# Patient Record
Sex: Male | Born: 1950
Health system: Southern US, Community
[De-identification: ages and names within clinical notes are randomized; demographics above are authoritative.]

## PROBLEM LIST (undated history)

## (undated) DIAGNOSIS — M545 Low back pain, unspecified: Secondary | ICD-10-CM

## (undated) DIAGNOSIS — G43909 Migraine, unspecified, not intractable, without status migrainosus: Secondary | ICD-10-CM

## (undated) DIAGNOSIS — D649 Anemia, unspecified: Secondary | ICD-10-CM

## (undated) DIAGNOSIS — J449 Chronic obstructive pulmonary disease, unspecified: Secondary | ICD-10-CM

## (undated) DIAGNOSIS — I701 Atherosclerosis of renal artery: Secondary | ICD-10-CM

## (undated) DIAGNOSIS — I2510 Atherosclerotic heart disease of native coronary artery without angina pectoris: Secondary | ICD-10-CM

## (undated) DIAGNOSIS — I82409 Acute embolism and thrombosis of unspecified deep veins of unspecified lower extremity: Secondary | ICD-10-CM

## (undated) DIAGNOSIS — Z8639 Personal history of other endocrine, nutritional and metabolic disease: Secondary | ICD-10-CM

## (undated) DIAGNOSIS — I255 Ischemic cardiomyopathy: Secondary | ICD-10-CM

## (undated) DIAGNOSIS — I714 Abdominal aortic aneurysm, without rupture, unspecified: Secondary | ICD-10-CM

## (undated) DIAGNOSIS — R918 Other nonspecific abnormal finding of lung field: Secondary | ICD-10-CM

## (undated) DIAGNOSIS — I6529 Occlusion and stenosis of unspecified carotid artery: Secondary | ICD-10-CM

## (undated) DIAGNOSIS — K219 Gastro-esophageal reflux disease without esophagitis: Secondary | ICD-10-CM

## (undated) DIAGNOSIS — I219 Acute myocardial infarction, unspecified: Secondary | ICD-10-CM

## (undated) DIAGNOSIS — I739 Peripheral vascular disease, unspecified: Secondary | ICD-10-CM

## (undated) DIAGNOSIS — I10 Essential (primary) hypertension: Secondary | ICD-10-CM

## (undated) DIAGNOSIS — I5042 Chronic combined systolic (congestive) and diastolic (congestive) heart failure: Secondary | ICD-10-CM

## (undated) DIAGNOSIS — E785 Hyperlipidemia, unspecified: Secondary | ICD-10-CM

## (undated) DIAGNOSIS — H5340 Unspecified visual field defects: Secondary | ICD-10-CM

## (undated) DIAGNOSIS — I480 Paroxysmal atrial fibrillation: Secondary | ICD-10-CM

## (undated) DIAGNOSIS — S0592XA Unspecified injury of left eye and orbit, initial encounter: Secondary | ICD-10-CM

## (undated) DIAGNOSIS — K7010 Alcoholic hepatitis without ascites: Secondary | ICD-10-CM

## (undated) DIAGNOSIS — J189 Pneumonia, unspecified organism: Secondary | ICD-10-CM

## (undated) DIAGNOSIS — I771 Stricture of artery: Secondary | ICD-10-CM

## (undated) HISTORY — DX: Migraine, unspecified, not intractable, without status migrainosus: G43.909

## (undated) HISTORY — DX: Ischemic cardiomyopathy: I25.5

## (undated) HISTORY — DX: Chronic obstructive pulmonary disease, unspecified: J44.9

## (undated) HISTORY — DX: Atherosclerosis of renal artery: I70.1

## (undated) HISTORY — PX: CARDIAC CATHETERIZATION: SHX172

## (undated) HISTORY — DX: Low back pain: M54.5

## (undated) HISTORY — DX: Hyperlipidemia, unspecified: E78.5

## (undated) HISTORY — DX: Other nonspecific abnormal finding of lung field: R91.8

## (undated) HISTORY — DX: Abdominal aortic aneurysm, without rupture, unspecified: I71.40

## (undated) HISTORY — DX: Abdominal aortic aneurysm, without rupture: I71.4

## (undated) HISTORY — DX: Occlusion and stenosis of unspecified carotid artery: I65.29

## (undated) HISTORY — DX: Peripheral vascular disease, unspecified: I73.9

## (undated) HISTORY — PX: TONSILLECTOMY: SUR1361

## (undated) HISTORY — DX: Unspecified injury of left eye and orbit, initial encounter: S05.92XA

## (undated) HISTORY — DX: Alcoholic hepatitis without ascites: K70.10

## (undated) HISTORY — DX: Stricture of artery: I77.1

## (undated) HISTORY — DX: Hypomagnesemia: E83.42

## (undated) HISTORY — DX: Unspecified visual field defects: H53.40

## (undated) HISTORY — DX: Essential (primary) hypertension: I10

## (undated) HISTORY — DX: Low back pain, unspecified: M54.50

## (undated) HISTORY — DX: Anemia, unspecified: D64.9

## (undated) HISTORY — DX: Acute myocardial infarction, unspecified: I21.9

## (undated) HISTORY — DX: Pneumonia, unspecified organism: J18.9

## (undated) HISTORY — DX: Atherosclerotic heart disease of native coronary artery without angina pectoris: I25.10

## (undated) HISTORY — DX: Personal history of other endocrine, nutritional and metabolic disease: Z86.39

---
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
## 1998-12-17 ENCOUNTER — Observation Stay: Admission: EM | Admit: 1998-12-17 | Discharge: 1998-12-18 | Payer: Self-pay | Admitting: Emergency Medicine

## 1998-12-17 ENCOUNTER — Encounter: Payer: Self-pay | Admitting: Emergency Medicine

## 2004-10-18 ENCOUNTER — Inpatient Hospital Stay (HOSPITAL_COMMUNITY): Admission: EM | Admit: 2004-10-18 | Discharge: 2004-10-25 | Payer: Self-pay | Admitting: Emergency Medicine

## 2006-11-30 ENCOUNTER — Encounter: Payer: Self-pay | Admitting: Critical Care Medicine

## 2006-12-06 ENCOUNTER — Ambulatory Visit: Payer: Self-pay | Admitting: Critical Care Medicine

## 2007-03-21 ENCOUNTER — Ambulatory Visit: Payer: Self-pay | Admitting: Critical Care Medicine

## 2007-05-06 ENCOUNTER — Emergency Department (HOSPITAL_COMMUNITY): Admission: EM | Admit: 2007-05-06 | Discharge: 2007-05-06 | Payer: Self-pay | Admitting: Emergency Medicine

## 2007-05-07 ENCOUNTER — Emergency Department (HOSPITAL_COMMUNITY): Admission: EM | Admit: 2007-05-07 | Discharge: 2007-05-07 | Payer: Self-pay | Admitting: Emergency Medicine

## 2007-05-09 ENCOUNTER — Ambulatory Visit (HOSPITAL_COMMUNITY): Admission: RE | Admit: 2007-05-09 | Discharge: 2007-05-09 | Payer: Self-pay | Admitting: Internal Medicine

## 2007-05-28 ENCOUNTER — Ambulatory Visit: Payer: Self-pay | Admitting: Internal Medicine

## 2007-06-04 ENCOUNTER — Ambulatory Visit: Payer: Self-pay

## 2007-06-04 ENCOUNTER — Encounter: Payer: Self-pay | Admitting: Internal Medicine

## 2007-06-11 ENCOUNTER — Ambulatory Visit: Payer: Self-pay | Admitting: Internal Medicine

## 2007-06-11 LAB — CONVERTED CEMR LAB
AST: 15 units/L (ref 0–37)
Bilirubin, Direct: 0.1 mg/dL (ref 0.0–0.3)
Chloride: 109 meq/L (ref 96–112)
Cholesterol: 169 mg/dL (ref 0–200)
Creatinine, Ser: 0.6 mg/dL (ref 0.4–1.5)
Glucose, Bld: 103 mg/dL — ABNORMAL HIGH (ref 70–99)
HDL: 25.4 mg/dL — ABNORMAL LOW (ref >39.0)
LDL Cholesterol: 120 mg/dL — ABNORMAL HIGH (ref 0–99)
Potassium: 4.1 meq/L (ref 3.5–5.1)
Sed Rate: 23 mm/hr — ABNORMAL HIGH (ref 0–20)
Sodium: 145 meq/L (ref 135–145)
TSH: 0.45 microintl units/mL (ref 0.35–5.50)
Total Bilirubin: 0.9 mg/dL (ref 0.3–1.2)
Triglycerides: 120 mg/dL (ref 0–149)

## 2007-06-25 ENCOUNTER — Ambulatory Visit: Payer: Self-pay | Admitting: Internal Medicine

## 2007-07-26 ENCOUNTER — Ambulatory Visit: Payer: Self-pay | Admitting: Critical Care Medicine

## 2007-07-26 DIAGNOSIS — J449 Chronic obstructive pulmonary disease, unspecified: Secondary | ICD-10-CM

## 2007-07-26 DIAGNOSIS — E785 Hyperlipidemia, unspecified: Secondary | ICD-10-CM

## 2007-07-26 DIAGNOSIS — I10 Essential (primary) hypertension: Secondary | ICD-10-CM | POA: Insufficient documentation

## 2007-08-02 ENCOUNTER — Ambulatory Visit: Payer: Self-pay | Admitting: Internal Medicine

## 2007-08-02 LAB — CONVERTED CEMR LAB
ALT: 22 units/L (ref 0–53)
AST: 17 units/L (ref 0–37)
Creatinine, Ser: 1.2 mg/dL (ref 0.4–1.5)
Potassium: 4.4 meq/L (ref 3.5–5.1)
VLDL: 16 mg/dL (ref 0–40)

## 2007-08-09 ENCOUNTER — Ambulatory Visit: Payer: Self-pay | Admitting: Internal Medicine

## 2007-08-09 DIAGNOSIS — G43909 Migraine, unspecified, not intractable, without status migrainosus: Secondary | ICD-10-CM | POA: Insufficient documentation

## 2007-09-06 ENCOUNTER — Ambulatory Visit: Payer: Self-pay | Admitting: Critical Care Medicine

## 2007-09-20 ENCOUNTER — Ambulatory Visit: Payer: Self-pay | Admitting: Internal Medicine

## 2007-09-20 DIAGNOSIS — H349 Unspecified retinal vascular occlusion: Secondary | ICD-10-CM | POA: Insufficient documentation

## 2007-09-20 DIAGNOSIS — I714 Abdominal aortic aneurysm, without rupture: Secondary | ICD-10-CM

## 2007-09-23 ENCOUNTER — Encounter (INDEPENDENT_AMBULATORY_CARE_PROVIDER_SITE_OTHER): Payer: Self-pay | Admitting: *Deleted

## 2008-01-28 ENCOUNTER — Ambulatory Visit: Payer: Self-pay | Admitting: Internal Medicine

## 2008-01-28 LAB — CONVERTED CEMR LAB
ALT: 21 units/L (ref 0–53)
AST: 19 units/L (ref 0–37)
Alkaline Phosphatase: 109 units/L (ref 39–117)
Bilirubin, Direct: 0.1 mg/dL (ref 0.0–0.3)
CO2: 29 meq/L (ref 19–32)
Chloride: 109 meq/L (ref 96–112)
Cholesterol: 142 mg/dL (ref 0–200)
GFR calc Af Amer: 98 mL/min
Glucose, Bld: 90 mg/dL (ref 70–99)
Potassium: 4.5 meq/L (ref 3.5–5.1)
Sodium: 142 meq/L (ref 135–145)
Total Protein: 6.9 g/dL (ref 6.0–8.3)

## 2008-01-30 ENCOUNTER — Ambulatory Visit: Payer: Self-pay | Admitting: Internal Medicine

## 2008-01-30 DIAGNOSIS — F17200 Nicotine dependence, unspecified, uncomplicated: Secondary | ICD-10-CM

## 2008-07-28 ENCOUNTER — Ambulatory Visit: Payer: Self-pay | Admitting: Critical Care Medicine

## 2008-08-07 ENCOUNTER — Telehealth: Payer: Self-pay | Admitting: Critical Care Medicine

## 2008-08-10 ENCOUNTER — Telehealth: Payer: Self-pay | Admitting: Adult Health

## 2008-08-13 ENCOUNTER — Ambulatory Visit: Payer: Self-pay | Admitting: Critical Care Medicine

## 2008-09-03 ENCOUNTER — Ambulatory Visit: Payer: Self-pay | Admitting: Critical Care Medicine

## 2008-10-08 ENCOUNTER — Ambulatory Visit: Payer: Self-pay | Admitting: Critical Care Medicine

## 2008-12-30 ENCOUNTER — Encounter (INDEPENDENT_AMBULATORY_CARE_PROVIDER_SITE_OTHER): Payer: Self-pay | Admitting: *Deleted

## 2009-02-04 ENCOUNTER — Ambulatory Visit: Payer: Self-pay | Admitting: Internal Medicine

## 2009-02-04 DIAGNOSIS — M545 Low back pain, unspecified: Secondary | ICD-10-CM | POA: Insufficient documentation

## 2009-02-05 ENCOUNTER — Ambulatory Visit: Payer: Self-pay | Admitting: Diagnostic Radiology

## 2009-02-05 ENCOUNTER — Ambulatory Visit (HOSPITAL_BASED_OUTPATIENT_CLINIC_OR_DEPARTMENT_OTHER): Admission: RE | Admit: 2009-02-05 | Discharge: 2009-02-05 | Payer: Self-pay | Admitting: Internal Medicine

## 2009-02-07 ENCOUNTER — Telehealth: Payer: Self-pay | Admitting: Internal Medicine

## 2009-05-04 ENCOUNTER — Telehealth: Payer: Self-pay | Admitting: Internal Medicine

## 2009-07-29 ENCOUNTER — Ambulatory Visit: Payer: Self-pay | Admitting: Critical Care Medicine

## 2009-08-12 ENCOUNTER — Ambulatory Visit (HOSPITAL_BASED_OUTPATIENT_CLINIC_OR_DEPARTMENT_OTHER): Admission: RE | Admit: 2009-08-12 | Discharge: 2009-08-12 | Payer: Self-pay | Admitting: Critical Care Medicine

## 2009-08-12 ENCOUNTER — Ambulatory Visit: Payer: Self-pay | Admitting: Critical Care Medicine

## 2009-08-12 ENCOUNTER — Ambulatory Visit: Payer: Self-pay | Admitting: Diagnostic Radiology

## 2009-09-02 ENCOUNTER — Ambulatory Visit: Payer: Self-pay | Admitting: Critical Care Medicine

## 2009-09-02 ENCOUNTER — Ambulatory Visit: Payer: Self-pay | Admitting: Internal Medicine

## 2009-09-02 DIAGNOSIS — M7660 Achilles tendinitis, unspecified leg: Secondary | ICD-10-CM | POA: Insufficient documentation

## 2009-09-02 LAB — CONVERTED CEMR LAB
AST: 11 units/L (ref 0–37)
BUN: 24 mg/dL — ABNORMAL HIGH (ref 6–23)
Bilirubin, Direct: 0.2 mg/dL (ref 0.0–0.3)
CO2: 23 meq/L (ref 19–32)
Calcium: 10 mg/dL (ref 8.4–10.5)
Glucose, Bld: 80 mg/dL (ref 70–99)
Sodium: 141 meq/L (ref 135–145)
TSH: 1.255 microintl units/mL (ref 0.350–4.500)
Total Bilirubin: 0.7 mg/dL (ref 0.3–1.2)
Total CHOL/HDL Ratio: 4.4
VLDL: 34 mg/dL (ref 0–40)

## 2009-09-03 ENCOUNTER — Encounter: Payer: Self-pay | Admitting: Internal Medicine

## 2009-09-23 ENCOUNTER — Ambulatory Visit: Payer: Self-pay | Admitting: Critical Care Medicine

## 2009-10-07 ENCOUNTER — Ambulatory Visit: Payer: Self-pay | Admitting: Critical Care Medicine

## 2009-10-12 ENCOUNTER — Telehealth: Payer: Self-pay | Admitting: Internal Medicine

## 2009-10-28 ENCOUNTER — Ambulatory Visit: Payer: Self-pay | Admitting: Internal Medicine

## 2009-10-29 ENCOUNTER — Encounter: Payer: Self-pay | Admitting: Internal Medicine

## 2009-10-30 ENCOUNTER — Ambulatory Visit (HOSPITAL_COMMUNITY): Admission: RE | Admit: 2009-10-30 | Discharge: 2009-10-30 | Payer: Self-pay | Admitting: Orthopedic Surgery

## 2009-10-30 ENCOUNTER — Encounter: Payer: Self-pay | Admitting: Orthopedic Surgery

## 2009-11-02 ENCOUNTER — Telehealth: Payer: Self-pay | Admitting: Critical Care Medicine

## 2010-02-15 ENCOUNTER — Telehealth (INDEPENDENT_AMBULATORY_CARE_PROVIDER_SITE_OTHER): Payer: Self-pay | Admitting: *Deleted

## 2010-02-17 ENCOUNTER — Ambulatory Visit: Payer: Self-pay | Admitting: Critical Care Medicine

## 2010-02-17 ENCOUNTER — Ambulatory Visit: Payer: Self-pay | Admitting: Internal Medicine

## 2010-02-17 DIAGNOSIS — R079 Chest pain, unspecified: Secondary | ICD-10-CM

## 2010-02-18 ENCOUNTER — Telehealth: Payer: Self-pay | Admitting: Internal Medicine

## 2010-02-23 ENCOUNTER — Encounter: Payer: Self-pay | Admitting: Cardiology

## 2010-02-23 ENCOUNTER — Ambulatory Visit: Payer: Self-pay | Admitting: Cardiology

## 2010-02-23 ENCOUNTER — Telehealth: Payer: Self-pay | Admitting: Cardiology

## 2010-02-23 DIAGNOSIS — I679 Cerebrovascular disease, unspecified: Secondary | ICD-10-CM

## 2010-02-23 DIAGNOSIS — I739 Peripheral vascular disease, unspecified: Secondary | ICD-10-CM

## 2010-02-23 DIAGNOSIS — R011 Cardiac murmur, unspecified: Secondary | ICD-10-CM | POA: Insufficient documentation

## 2010-03-15 ENCOUNTER — Ambulatory Visit: Payer: Self-pay | Admitting: Critical Care Medicine

## 2010-03-18 ENCOUNTER — Telehealth (INDEPENDENT_AMBULATORY_CARE_PROVIDER_SITE_OTHER): Payer: Self-pay | Admitting: *Deleted

## 2010-03-24 ENCOUNTER — Ambulatory Visit: Payer: Self-pay

## 2010-03-24 ENCOUNTER — Ambulatory Visit (HOSPITAL_COMMUNITY): Admission: RE | Admit: 2010-03-24 | Discharge: 2010-03-24 | Payer: Self-pay | Admitting: Cardiology

## 2010-03-24 ENCOUNTER — Ambulatory Visit: Payer: Self-pay | Admitting: Cardiology

## 2010-03-24 ENCOUNTER — Encounter: Payer: Self-pay | Admitting: Cardiology

## 2010-05-13 ENCOUNTER — Telehealth: Payer: Self-pay | Admitting: Internal Medicine

## 2010-05-16 ENCOUNTER — Ambulatory Visit: Payer: Self-pay | Admitting: Cardiovascular Disease

## 2010-05-16 DIAGNOSIS — I701 Atherosclerosis of renal artery: Secondary | ICD-10-CM

## 2010-06-13 ENCOUNTER — Telehealth: Payer: Self-pay | Admitting: Critical Care Medicine

## 2010-06-16 ENCOUNTER — Ambulatory Visit: Payer: Self-pay | Admitting: Internal Medicine

## 2010-06-28 ENCOUNTER — Ambulatory Visit: Payer: Self-pay | Admitting: Adult Health

## 2010-07-16 ENCOUNTER — Ambulatory Visit: Payer: Self-pay | Admitting: Family Medicine

## 2010-07-16 DIAGNOSIS — J441 Chronic obstructive pulmonary disease with (acute) exacerbation: Secondary | ICD-10-CM

## 2010-08-11 ENCOUNTER — Ambulatory Visit: Payer: Self-pay | Admitting: Critical Care Medicine

## 2010-08-15 ENCOUNTER — Ambulatory Visit: Payer: Self-pay | Admitting: Internal Medicine

## 2010-08-23 ENCOUNTER — Telehealth (INDEPENDENT_AMBULATORY_CARE_PROVIDER_SITE_OTHER): Payer: Self-pay | Admitting: *Deleted

## 2010-08-23 ENCOUNTER — Telehealth: Payer: Self-pay | Admitting: Critical Care Medicine

## 2010-09-27 ENCOUNTER — Ambulatory Visit: Payer: Self-pay | Admitting: Internal Medicine

## 2010-09-28 ENCOUNTER — Ambulatory Visit
Admission: RE | Admit: 2010-09-28 | Discharge: 2010-09-28 | Payer: Self-pay | Source: Home / Self Care | Attending: Internal Medicine | Admitting: Internal Medicine

## 2010-10-23 ENCOUNTER — Encounter: Payer: Self-pay | Admitting: Internal Medicine

## 2010-11-01 ENCOUNTER — Ambulatory Visit
Admission: RE | Admit: 2010-11-01 | Discharge: 2010-11-01 | Payer: Self-pay | Source: Home / Self Care | Attending: Critical Care Medicine | Admitting: Critical Care Medicine

## 2010-11-01 NOTE — Progress Notes (Signed)
Summary: Hydrocodone Refill  Phone Note Refill Request Message from:  Fax from Pharmacy on May 13, 2010 2:38 PM  Refills Requested: Medication #1:  HYDROCODONE-ACETAMINOPHEN 5-500 MG TABS one by mouth two times a day prn   Dosage confirmed as above?Dosage Confirmed   Brand Name Necessary? No   Supply Requested: 1 month   Last Refilled: 03/31/2010  Method Requested: Telephone to Pharmacy Next Appointment Scheduled: No follow up appt  with Dr Artist Pais Initial call taken by: Glendell Docker CMA,  May 13, 2010 2:38 PM  Follow-up for Phone Call        refill x 1  for treatment of migraine headaches. Follow-up by: D. Thomos Lemons DO,  May 16, 2010 9:43 AM  Additional Follow-up for Phone Call Additional follow up Details #1::        Rx called to pharmacy to [facility]. Call placed to patient at [phone], no answer, voice message left informing patient rx phoned to pharmacy Additional Follow-up by: Glendell Docker CMA,  May 16, 2010 11:57 AM    Prescriptions: HYDROCODONE-ACETAMINOPHEN 5-500 MG TABS (HYDROCODONE-ACETAMINOPHEN) one by mouth two times a day prn  #90 x 0   Entered by:   Glendell Docker CMA   Authorized by:   D. Thomos Lemons DO   Signed by:   Glendell Docker CMA on 05/16/2010   Method used:   Telephoned to ...       [id number] (retail)       [address]       [place], [place]  [address]       Ph: [phone] or [phone]       Fax: [phone]   RxID:   [id number]

## 2010-11-01 NOTE — Progress Notes (Signed)
Summary: Benicar Refill  Phone Note Call from Patient Call back at Home Phone [phone]   Caller: Lequita Asal Summary of Call: Patient spouse called back and left voice message stating patient did not have any Benicar left and will need a rx for the dosage change sent to his pharmacy.  Initial call taken by: Glendell Docker CMA,  October 12, 2009 11:41 AM  Follow-up for Phone Call        Phone Call Completed, Rx sent electronically to pharmacy Follow-up by: Glendell Docker CMA,  October 12, 2009 11:41 AM    Prescriptions: BENICAR 20 MG TABS (OLMESARTAN MEDOXOMIL) once daily  #30 x 3   Entered by:   Glendell Docker CMA   Authorized by:   D. Thomos Lemons DO   Signed by:   Glendell Docker CMA on 10/12/2009   Method used:   Electronically to        [id number] (retail)       [address]       [place], [place]  [address]       Ph: [phone] or [phone]       Fax: [phone]   RxID:   [id number]

## 2010-11-01 NOTE — Assessment & Plan Note (Signed)
Summary: 1 month follow up/mhf--Rm 2   Vital Signs:  Patient profile:   60 year old male Height:      68 inches Weight:      182.50 pounds BMI:     27.85 O2 Sat:      94 % on Room air Temp:     97.9 degrees F oral Pulse rate:   77 / minute Pulse rhythm:   regular Resp:     18 per minute BP sitting:   164 / 76  (right arm) Cuff size:   large  Vitals Entered By: Mervin Kung CMA Duncan Dull) (June 16, 2010 2:11 PM)  O2 Flow:  Room air CC: Rm 2   Follow up. Is Patient Diabetic? No Pain Assessment Patient in pain? yes     Location: lower back and right leg Intensity: 5 Type: sharp Comments Pt states back pain is no worse but is not getting better. Needs refill on Simvastatin and Hydromet.  Pt agrees all med doses and directions correct. Nicki Guadalajara Fergerson CMA Duncan Dull)  June 16, 2010 2:17 PM    Primary Care Provider:  Dondra Spry DO  CC:  Rm 2   Follow up.Marland Kitchen  History of Present Illness: 60 y/o white male for f/u pt diagnosed with significant PAD still unable to quit smoking  c/o chronic intermittent back pain and occ headaches  htn - taking benicar regularly  Preventive Screening-Counseling & Management  Alcohol-Tobacco     Smoking Status: current     Smoking Cessation Counseling: yes  Allergies: 1)  ! Penicillin 2)  ! Cephalexin 3)  ! Avelox (Moxifloxacin Hcl)  Past History:  Past Medical History: Hypertension Hyperlipidemia AAA 5.0x4.8  BACK PAIN, LUMBAR  MIGRAINE HEADACHE COPD Golds Stage II     -Fev1 73%  FeV1FVC 49%  Rv 135%  DLCO 68% 03/2007 History of severe hypokalemia and hypomagnesemia secondary to alcohol abuse January of 2006.  Right Visual field cut with supranasal quadrantopia secondary to retinal artery occlusion History of alcoholic hepatitis in January of 2006. Status post trauma to the left eye 15 years ago. Left Achilles tendon rupture PAD with intermittent claudication, bilateral SFA occlusion Left subclavian stenosis by  ultrasound  Past Surgical History: Tonsillectomy  adenoidectomy   Family History: Father died in his mid 18s secondary to myocardial infarction Mother died young age secondary to throat cancer      Social History: Occupation: Engineer, structural Married with two grown daughters  current smoker - using e cigarettes Alcohol use-no; former abuse quit 2006  Physical Exam  General:  alert, well-developed, and well-nourished.   Lungs:  decreased BS bilateral and prolonged exhalation.  bilateral exp wheeze  Heart:  SEM 2 /6 RSB, normal rate, regular rhythm, and no gallop.     Impression & Recommendations:  Problem # 1:  HYPERTENSION (ICD-401.9) Assessment Deteriorated add amlodipine  The following medications were removed from the medication list:    Benicar 40 Mg Tabs (Olmesartan medoxomil) .Marland Kitchen... Take one tablet by mouth daily His updated medication list for this problem includes:    Azor 5-40 Mg Tabs (Amlodipine-olmesartan) ..... One by mouth once daily  Orders: T-Basic Metabolic Panel ([id number])  BP today: 164/76 Prior BP: 132/72 (05/16/2010)  Labs Reviewed: K+: 5.1 (09/02/2009) Creat: : 1.04 (09/02/2009)   Chol: 167 (09/02/2009)   HDL: 38 (09/02/2009)   LDL: 95 (09/02/2009)   TG: 171 (09/02/2009)  Problem # 2:  HYPERLIPIDEMIA (ICD-272.4) Assessment: Improved  His updated medication list for this problem  includes:    Simvastatin 40 Mg Tabs (Simvastatin) ..... One by mouth once daily  Labs Reviewed: SGOT: 11 (09/02/2009)   SGPT: 15 (09/02/2009)   HDL:38 (09/02/2009), 32.0 (01/28/2008)  LDL:95 (09/02/2009), 87 (01/28/2008)  Chol:167 (09/02/2009), 142 (01/28/2008)  Trig:171 (09/02/2009), 114 (01/28/2008)  Problem # 3:  BACK PAIN, LUMBAR (ICD-724.2)  His updated medication list for this problem includes:    Sm Aspirin 325 Mg Tabs (Aspirin) ..... One by mouth once daily    Hydrocodone-acetaminophen 7.5-500 Mg Tabs (Hydrocodone-acetaminophen) ..... One by mouth two  times a day as needed  Problem # 4:  TOBACCO ABUSE (ICD-305.1)  Encouraged smoking cessation and discussed different methods for smoking cessation.  spent 5-10 mins counseling pt  Complete Medication List: 1)  Dulera 200-5 Mcg/act Aero (Mometasone furo-formoterol fum) .... 2 puffs twice per day 2)  Simvastatin 40 Mg Tabs (Simvastatin) .... One by mouth once daily 3)  Sm Aspirin 325 Mg Tabs (Aspirin) .... One by mouth once daily 4)  Amitriptyline Hcl 25 Mg Tabs (Amitriptyline hcl) .... One by mouth bedtime 5)  Proair Hfa 108 (90 Base) Mcg/act Aers (Albuterol sulfate) .... One to two puff every 4 hours as needed 6)  Hydrocodone-acetaminophen 7.5-500 Mg Tabs (Hydrocodone-acetaminophen) .... One by mouth two times a day as needed 7)  Hydromet 5-1.5 Mg/45ml Syrp (Hydrocodone-homatropine) .Marland Kitchen.. 1-2 tsp every 6 hr as needed cough 8)  Albuterol Sulfate (2.5 Mg/62ml) 0.083% Nebu (Albuterol sulfate) .Marland Kitchen.. 1 vial in nebulizer every 4-6 hours as needed for wheezing 9)  Multivitamins Tabs (Multiple vitamin) .... Take 1 tablet by mouth once a day 10)  B Complex Tabs (B complex vitamins) .... Take 1 tablet by mouth once a day 11)  Pletal 100 Mg Tabs (Cilostazol) .... Take one tablet by mouth two times a day 12)  Azor 5-40 Mg Tabs (Amlodipine-olmesartan) .... One by mouth once daily 13)  Doxycycline Hyclate 100 Mg Caps (Doxycycline hyclate) .Marland Kitchen.. 1 by mouth two times a day 14)  Prednisone 10 Mg Tabs (Prednisone) .... 4 tabs for 2 days, then 3 tabs for 2 days, 2 tabs for 2 days, then 1 tab for 2 days, then stop  Patient Instructions: 1)  Please schedule a follow-up appointment in 2 months. Prescriptions: HYDROCODONE-ACETAMINOPHEN 7.5-500 MG TABS (HYDROCODONE-ACETAMINOPHEN) one by mouth two times a day as needed  #60 x 1   Entered and Authorized by:   D. Thomos Lemons DO   Signed by:   D. Thomos Lemons DO on 06/16/2010   Method used:   Print then Give to Patient   RxID:   [id number] AZOR 5-40 MG TABS  (AMLODIPINE-OLMESARTAN) one by mouth once daily  #30 x 2   Entered and Authorized by:   D. Thomos Lemons DO   Signed by:   D. Thomos Lemons DO on 06/16/2010   Method used:   Electronically to        [id number] ([facility])       [address]       [place], [place]  [address]       Ph: [phone] or [phone]       Fax: [phone]   RxID:   [id number]    Current Allergies (reviewed today): ! PENICILLIN ! CEPHALEXIN ! AVELOX (MOXIFLOXACIN HCL)

## 2010-11-01 NOTE — Assessment & Plan Note (Signed)
Summary: Pulmonary OV   Primary Provider/Referring Provider:  Dondra Spry DO  CC:  Pt here for follow up and refills printed. Pt states breathing is the same, productive cough with clear mucus worse "first thing in the morning", chest tightness, and SOB with exertion. Pt states is smoking 3/4 ppd. Pt states has been out of Qvar x months.  History of Present Illness: 60 year old with known history of with known history of COPD , current smoker .  Golds Stage II  FeV1 73% DLCO 68%   September 03, 2008 11:31 AM  Since last OV 11/09  still coughing and wheezing. Got better wiht ABX and pred but as meds were finished pt worsened.  Still smoking 2PPD.  Mucous now dark green.  Pt is dyspneic in the PM.  No chest pain.  No postnasal drip.   Pt denies any significant sore throat, nasal congestion , fever, chills, sweats, unintended weight loss, pleurtic or exertional chest pain, orthopnea PND, or leg swelling  October 08, 2008 12:03 PM Markedly improved dyspnea.  Less cough,  no chest pain.   Pt is still smoking.  Dyspnea is less.  On symbicort.  Proair is as needed .  July 29, 2009--Presents for an acute office visit. Complains of prod cough with green mucus, increased SOB, wheezing x1week. Otc not helping. Denies chest pain, dyspnea, orthopnea, hemoptysis, fever, n/v/d, edema, headache. Doing well until this last week.   August 12, 2009 11:49 AM Pt not any better from 10/28 NP ov. Mucous is still green.  Pt still dyspneic with wheezing.  No chest pain.  No other issues. The pt is still smoking and failed chantix  September 02, 2009 --Pt improved on prednisone and abx but worsened off.  Pt is still smoking.  Pt is stressed at work. Pt with still cough productive of thick green.  No fever. Pt is still dyspneic. Pt is smoking 1/2 PPD  September 23, 2009--Returns with persistent symptoms of increased SOB, tightness in chest, wheezing, prod cough with clear-to-gray mucus x1week . He continues to  smoke 1 ppd. Felt better on higher dose of prednisone. currently on 10mg  once daily .CXR last visit was with no acute changes. Denies chest pain,  orthopnea, hemoptysis, fever, n/v/d, edema, headache.  October 07, 2009 11:48 AM Cough is better vs before,   grey  mucous in the am and then clears out. Still dyspneic with long walks  Still able to work. Quit on 10/02/09 The pt saw NP on 12/23 and to stay on pred 20mg /d  There was rx with pulse pred and dexilant for 10days  The thrush rx was given diflucan and has finished   Feb 17, 2010 10:54 AM Was able to quit.  used the nicotrol tried the chantix,  got irritable.   achilles tendon rupture rx wiht cast and healed. healed secondarily Now has cough and is prod of clear mucous.  Back to smoking. dyspnea never improved that much and now sl worse. if get out of truck will worsen   Preventive Screening-Counseling & Management  Alcohol-Tobacco     Smoking Status: current     Packs/Day: 0.5  Current Medications (verified): 1)  Symbicort 160-4.5 Mcg/act  Aero (Budesonide-Formoterol Fumarate) .... Two Puff Two Times A Day 2)  Simvastatin 40 Mg  Tabs (Simvastatin) .... One By Mouth Once Daily 3)  Benicar 20 Mg Tabs (Olmesartan Medoxomil) .... Once By Mouth Once Daily 4)  Sm Aspirin 325 Mg  Tabs (Aspirin) .... One By Mouth Once Daily 5)  Amitriptyline Hcl 25 Mg  Tabs (Amitriptyline Hcl) .... One By Mouth Bedtime 6)  Proair Hfa 108 (90 Base) Mcg/act  Aers (Albuterol Sulfate) .... One To Two Puff Every 4 Hours As Needed 7)  Qvar 80 Mcg/act Aers (Beclomethasone Dipropionate) .... 2 Puffs Two Times A Day-Using Sample From Last Ov Until Gone 8)  Hydrocodone-Acetaminophen 5-500 Mg Tabs (Hydrocodone-Acetaminophen) .... One By Mouth Two Times A Day Prn 9)  Chantix Starting Month Pak 0.5 Mg X 11 & 1 Mg X 42 Tabs (Varenicline Tartrate) .... Use As Directed 10)  Chantix Continuing Month Pak 1 Mg Tabs (Varenicline Tartrate) .... Use As Directed  Allergies  (verified): 1)  ! Penicillin 2)  ! Cephalexin 3)  ! Avelox (Moxifloxacin Hcl)  Past History:  Past medical, surgical, family and social histories (including risk factors) reviewed, and no changes noted (except as noted below).  Past Medical History: Reviewed history from 10/28/2009 and no changes required. COPD Golds Stage II     -Fev1 73%  FeV1FVC 49%  Rv 135%  DLCO 68% 03/2007 Hyperlipidemia Hypertension   Right Visual field cut with supranasal quadrantopia    secondary to retinal artery occlusion AAA 4.2 x 3.8 Tobacco Abuse  History of alcohol abuse   Past Surgical History: Reviewed history from 10/28/2009 and no changes required. Tonsillectomy     Family History: Reviewed history from 10/28/2009 and no changes required. Father died in his mid 31s secondary to massive myocardial infarction Mother died young age secondary to throat cancer     Social History: Reviewed history from 10/28/2009 and no changes required. Occupation: Engineer, structural Married with two grown daughters  current smoker x27yrs,  12 cigs a day Alcohol use-no  I Smoking Status:  current  Review of Systems       The patient complains of shortness of breath with activity, shortness of breath at rest, productive cough, and non-productive cough.  The patient denies coughing up blood, chest pain, irregular heartbeats, acid heartburn, indigestion, loss of appetite, weight change, abdominal pain, difficulty swallowing, sore throat, tooth/dental problems, headaches, nasal congestion/difficulty breathing through nose, sneezing, itching, ear ache, anxiety, depression, hand/feet swelling, joint stiffness or pain, rash, change in color of mucus, and fever.    Vital Signs:  Patient profile:   60 year old male Height:      68 inches Weight:      173 pounds O2 Sat:      96 % on Room air Temp:     97.9 degrees F oral Pulse rate:   64 / minute BP sitting:   148 / 78  (left arm) Cuff size:   regular  Vitals  Entered By: Zackery Barefoot CMA (Feb 17, 2010 10:34 AM)  O2 Flow:  Room air CC: Pt here for follow up and refills printed. Pt states breathing is the same, productive cough with clear mucus worse "first thing in the morning", chest tightness, SOB with exertion. Pt states is smoking 3/4 ppd. Pt states has been out of Qvar x months Comments Medications reviewed with patient Verified contact number and pharmacy with patient Zackery Barefoot CMA  Feb 17, 2010 10:38 AM    Physical Exam  Additional Exam:  Gen: WD WN      WM    in NAD    NCAT Heent:  no jvd, no TMG, no cervical LNademopathy, orophyx clear,  nares with clear watery drainage. Cor: RRR nl s1/s2  no s3/s4  no m r h g Abd: soft NT BSA   no masses  No HSM  no rebound or guarding Ext perfused with no c v e v.d Neuro: intact, moves all 4s, CN II-XII intact, DTRs intact Chest: coarse BS with poor airflow Skin: clear  Genital/Rectal :deferred    Impression & Recommendations:  Problem # 1:  TOBACCO ABUSE (ICD-305.1) Assessment Deteriorated recurrent tobacco abuse smoking cessation counselling given pt already has chantix rx,  if becomes agitated on current dose, pt advised to reduce dose in half  His updated medication list for this problem includes:    Chantix Starting Month Pak 0.5 Mg X 11 & 1 Mg X 42 Tabs (Varenicline tartrate) ..... Use as directed    Chantix Continuing Month Pak 1 Mg Tabs (Varenicline tartrate) ..... Use as directed  Orders: Est. Patient Level IV ([address]) Prescription Created Electronically [phone]) Tobacco use cessation intensive >10 minutes (09811)  Problem # 2:  COPD (ICD-496) Assessment: Deteriorated copd with ongoing asthmatic bronchitis flare plan subst dulera 200 two puff two times a day with symbicort for better inhaled steroid coverage while smokinjg  Medications Added to Medication List This Visit: 1)  Dulera 200-5 Mcg/act Aero (Mometasone furo-formoterol fum) .... 2 puffs twice per day 2)   Qvar 80 Mcg/act Aers (Beclomethasone dipropionate) .... 2 puffs two times a day-using sample from last ov until gone  Complete Medication List: 1)  Dulera 200-5 Mcg/act Aero (Mometasone furo-formoterol fum) .... 2 puffs twice per day 2)  Simvastatin 40 Mg Tabs (Simvastatin) .... One by mouth once daily 3)  Benicar 20 Mg Tabs (Olmesartan medoxomil) .... Once by mouth once daily 4)  Sm Aspirin 325 Mg Tabs (Aspirin) .... One by mouth once daily 5)  Amitriptyline Hcl 25 Mg Tabs (Amitriptyline hcl) .... One by mouth bedtime 6)  Proair Hfa 108 (90 Base) Mcg/act Aers (Albuterol sulfate) .... One to two puff every 4 hours as needed 7)  Hydrocodone-acetaminophen 5-500 Mg Tabs (Hydrocodone-acetaminophen) .... One by mouth two times a day prn 8)  Chantix Starting Month Pak 0.5 Mg X 11 & 1 Mg X 42 Tabs (Varenicline tartrate) .... Use as directed 9)  Chantix Continuing Month Pak 1 Mg Tabs (Varenicline tartrate) .... Use as directed  Patient Instructions: 1)  Stop Symbicort 2)  Start Dulera 200 two puff twice daily  use sample , refills sent to pharmacy 3)  Take Chantix ,  if you become irritable on chantix, then reduce to 1/2 tablet at each dose level 4)  Return 3 months [place] Prescriptions: DULERA 200-5 MCG/ACT AERO (MOMETASONE FURO-FORMOTEROL FUM) 2 puffs twice per day  #1 x 6   Entered and Authorized by:   Storm Frisk MD   Signed by:   Storm Frisk MD on 02/17/2010   Method used:   Electronically to        [id number]       [address]       [place], [place]  [address]       Ph: [phone] or [phone]       Fax: [phone]   RxID:   [id number]

## 2010-11-01 NOTE — Assessment & Plan Note (Signed)
Summary: cough/ wheezing sob/ mbw   Visit Type:  Acute NP visit Copy to:  Dr Jens Som Primary Hetvi Shawhan/Referring Aahna Rossa:  D. Thomos Lemons DO  CC:  Pt c/o chest congestion, increased SOB, productive cough with dark gray to green, and PNP.  History of Present Illness: 60 year old with known history of with known history of COPD , current smoker .  Golds Stage II  FeV1 73% DLCO 68%   September 03, 2008 11:31 AM  Since last OV 11/09  still coughing and wheezing. Got better wiht ABX and pred but as meds were finished pt worsened.  Still smoking 2PPD.  Mucous now dark green.  Pt is dyspneic in the PM.  No chest pain.  No postnasal drip.   Pt denies any significant sore throat, nasal congestion , fever, chills, sweats, unintended weight loss, pleurtic or exertional chest pain, orthopnea PND, or leg swelling  October 08, 2008 12:03 PM Markedly improved dyspnea.  Less cough,  no chest pain.   Pt is still smoking.  Dyspnea is less.  On symbicort.  Proair is as needed .  July 29, 2009--Presents for an acute office visit. Complains of prod cough with green mucus, increased SOB, wheezing x1week. Otc not helping. Denies chest pain, dyspnea, orthopnea, hemoptysis, fever, n/v/d, edema, headache. Doing well until this last week.   August 12, 2009 11:49 AM Pt not any better from 10/28 NP ov. Mucous is still green.  Pt still dyspneic with wheezing.  No chest pain.  No other issues. The pt is still smoking and failed chantix  September 02, 2009 --Pt improved on prednisone and abx but worsened off.  Pt is still smoking.  Pt is stressed at work. Pt with still cough productive of thick green.  No fever. Pt is still dyspneic. Pt is smoking 1/2 PPD  September 23, 2009--Returns with persistent symptoms of increased SOB, tightness in chest, wheezing, prod cough with clear-to-gray mucus x1week . He continues to smoke 1 ppd. Felt better on higher dose of prednisone. currently on 10mg  once daily .CXR last visit  was with no acute changes. Denies chest pain,  orthopnea, hemoptysis, fever, n/v/d, edema, headache.  October 07, 2009 11:48 AM Cough is better vs before,   grey  mucous in the am and then clears out. Still dyspneic with long walks  Still able to work. Quit on 10/02/09 The pt saw NP on 12/23 and to stay on pred 20mg /d  There was rx with pulse pred and dexilant for 10days  The thrush rx was given diflucan and has finished   Feb 17, 2010 10:54 AM Was able to quit.  used the nicotrol tried the chantix,  got irritable.   achilles tendon rupture rx wiht cast and healed. healed secondarily Now has cough and is prod of clear mucous.  Back to smoking. dyspnea never improved that much and now sl worse. if get out of truck will worsen  03/15/10--Presents for an acute office visit. Complains of prod cough with white/gray mucus, wheezing, increased SOB, feverish x5days - has been taking 50mg  prednisone x3days. Had left over steroids at home. Mucinex not helping.    June 28, 2010 --Presents for an acute office visit. Complains of  chest congestion, increased SOB, productive cough with dark gray to green, post nasal drip for last 5 days. Taken advil as needed . Denies chest pain,  orthopnea, hemoptysis, fever, n/v/d, edema, headache.  has been doing ok until last week. Wife had  similar symptoms 1 week ago.   Preventive Screening-Counseling & Management  Alcohol-Tobacco     Alcohol drinks/day: 0     Smoking Status: current     Packs/Day: 0.5     Year Started: 1963     Year Quit: 10/02/09  Medications Prior to Update: 1)  Dulera 200-5 Mcg/act Aero (Mometasone Furo-Formoterol Fum) .... 2 Puffs Twice Per Day 2)  Simvastatin 40 Mg  Tabs (Simvastatin) .... One By Mouth Once Daily 3)  Sm Aspirin 325 Mg  Tabs (Aspirin) .... One By Mouth Once Daily 4)  Amitriptyline Hcl 25 Mg  Tabs (Amitriptyline Hcl) .... One By Mouth Bedtime 5)  Proair Hfa 108 (90 Base) Mcg/act  Aers (Albuterol Sulfate) .... One To  Two Puff Every 4 Hours As Needed 6)  Hydrocodone-Acetaminophen 7.5-500 Mg Tabs (Hydrocodone-Acetaminophen) .... One By Mouth Two Times A Day As Needed 7)  Hydromet 5-1.5 Mg/75ml Syrp (Hydrocodone-Homatropine) .Marland Kitchen.. 1-2 Tsp Every 6 Hr As Needed Cough 8)  Albuterol Sulfate (2.5 Mg/21ml) 0.083% Nebu (Albuterol Sulfate) .Marland Kitchen.. 1 Vial in Nebulizer Every 4-6 Hours As Needed For Wheezing 9)  Multivitamins  Tabs (Multiple Vitamin) .... Take 1 Tablet By Mouth Once A Day 10)  B Complex  Tabs (B Complex Vitamins) .... Take 1 Tablet By Mouth Once A Day 11)  Pletal 100 Mg Tabs (Cilostazol) .... Take One Tablet By Mouth Two Times A Day 12)  Azor 5-40 Mg Tabs (Amlodipine-Olmesartan) .... One By Mouth Once Daily  Current Medications (verified): 1)  Dulera 200-5 Mcg/act Aero (Mometasone Furo-Formoterol Fum) .... 2 Puffs Twice Per Day 2)  Simvastatin 40 Mg  Tabs (Simvastatin) .... One By Mouth Once Daily 3)  Sm Aspirin 325 Mg  Tabs (Aspirin) .... One By Mouth Once Daily 4)  Amitriptyline Hcl 25 Mg  Tabs (Amitriptyline Hcl) .... One By Mouth Bedtime 5)  Proair Hfa 108 (90 Base) Mcg/act  Aers (Albuterol Sulfate) .... One To Two Puff Every 4 Hours As Needed 6)  Hydrocodone-Acetaminophen 7.5-500 Mg Tabs (Hydrocodone-Acetaminophen) .... One By Mouth Two Times A Day As Needed 7)  Hydromet 5-1.5 Mg/86ml Syrp (Hydrocodone-Homatropine) .Marland Kitchen.. 1-2 Tsp Every 6 Hr As Needed Cough 8)  Albuterol Sulfate (2.5 Mg/84ml) 0.083% Nebu (Albuterol Sulfate) .Marland Kitchen.. 1 Vial in Nebulizer Every 4-6 Hours As Needed For Wheezing 9)  Multivitamins  Tabs (Multiple Vitamin) .... Take 1 Tablet By Mouth Once A Day 10)  B Complex  Tabs (B Complex Vitamins) .... Take 1 Tablet By Mouth Once A Day 11)  Pletal 100 Mg Tabs (Cilostazol) .... Take One Tablet By Mouth Two Times A Day 12)  Azor 5-40 Mg Tabs (Amlodipine-Olmesartan) .... One By Mouth Once Daily  Allergies (verified): 1)  ! Penicillin 2)  ! Cephalexin 3)  ! Avelox (Moxifloxacin Hcl)  Past  History:  Past Medical History: Last updated: 05/16/2010 Hypertension Hyperlipidemia AAA 5.0x4.8 BACK PAIN, LUMBAR  MIGRAINE HEADACHE COPD Golds Stage II     -Fev1 73%  FeV1FVC 49%  Rv 135%  DLCO 68% 03/2007 History of severe hypokalemia and hypomagnesemia secondary to alcohol abuse January of 2006.  Right Visual field cut with supranasal quadrantopia secondary to retinal artery occlusion History of alcoholic hepatitis in January of 2006. Status post trauma to the left eye 15 years ago. Left Achilles tendon rupture PAD with intermittent claudication, bilateral SFA occlusion Left subclavian stenosis by ultrasound  Past Surgical History: Last updated: 03-04-2010 Tonsillectomy  adenoidectomy  Family History: Last updated: 03/04/2010 Father died in his mid 43s secondary to myocardial  infarction Mother died young age secondary to throat cancer     Social History: Last updated: 05/16/2010 Occupation: Engineer, structural Married with two grown daughters  current smoker (3/4 ppd) Alcohol use-no; former abuse quit 2006 I   Review of Systems      See HPI  Vital Signs:  Patient profile:   60 year old male Height:      68 inches Weight:      169 pounds BMI:     25.79 O2 Sat:      93 % on Room air Temp:     98.1 degrees F oral Pulse rate:   102 / minute BP sitting:   118 / 68  (left arm) Cuff size:   regular  Vitals Entered By: Zackery Barefoot CMA (June 28, 2010 2:43 PM)  O2 Flow:  Room air CC: Pt c/o chest congestion, increased SOB, productive cough with dark gray to green, PNP Comments Medications reviewed with patient Verified contact number and pharmacy with patient Zackery Barefoot CMA  June 28, 2010 2:44 PM    Physical Exam  Additional Exam:  Gen: WD WN      WM    in NAD    NCAT Heent:  no jvd, no TMG, no cervical LNademopathy, orophyx clear,  nares with clear watery drainage. Cor: RRR nl s1/s2  no s3/s4  no m r h g Abd: soft NT BSA   no masses  No  HSM  no rebound or guarding Ext perfused with no c v e v.d Neuro: intact, moves all 4s, CN II-XII intact, DTRs intact Chest: coarse BS with few exp wheezing  Skin: clear  Genital/Rectal :deferred    Impression & Recommendations:  Problem # 1:  COPD (ICD-496)  Exacerbation  encouraged on smoking cesstation Plan:  Doxycycline 100mg  two times a day for 7 days (has hx of several abx reactions. ) Mucinex DM two times a day as needed cough/congestion  Increase fluids. and rest.  Prednisone taper over next week --to have on hold if symptoms worsen or do not improve with wheezing  Please contact office for sooner follow up if symptoms do not improve or worsen  follow up Dr. Delford Field in 6 weeks at [facility].   Orders: Est. Patient Level IV ([address])  Medications Added to Medication List This Visit: 1)  Doxycycline Hyclate 100 Mg Caps (Doxycycline hyclate) .Marland Kitchen.. 1 by mouth two times a day 2)  Prednisone 10 Mg Tabs (Prednisone) .... 4 tabs for 2 days, then 3 tabs for 2 days, 2 tabs for 2 days, then 1 tab for 2 days, then stop  Complete Medication List: 1)  Dulera 200-5 Mcg/act Aero (Mometasone furo-formoterol fum) .... 2 puffs twice per day 2)  Simvastatin 40 Mg Tabs (Simvastatin) .... One by mouth once daily 3)  Sm Aspirin 325 Mg Tabs (Aspirin) .... One by mouth once daily 4)  Amitriptyline Hcl 25 Mg Tabs (Amitriptyline hcl) .... One by mouth bedtime 5)  Proair Hfa 108 (90 Base) Mcg/act Aers (Albuterol sulfate) .... One to two puff every 4 hours as needed 6)  Hydrocodone-acetaminophen 7.5-500 Mg Tabs (Hydrocodone-acetaminophen) .... One by mouth two times a day as needed 7)  Hydromet 5-1.5 Mg/52ml Syrp (Hydrocodone-homatropine) .Marland Kitchen.. 1-2 tsp every 6 hr as needed cough 8)  Albuterol Sulfate (2.5 Mg/58ml) 0.083% Nebu (Albuterol sulfate) .Marland Kitchen.. 1 vial in nebulizer every 4-6 hours as needed for wheezing 9)  Multivitamins Tabs (Multiple vitamin) .... Take 1 tablet by mouth once a day  10)  B  Complex Tabs (B complex vitamins) .... Take 1 tablet by mouth once a day 11)  Pletal 100 Mg Tabs (Cilostazol) .... Take one tablet by mouth two times a day 12)  Azor 5-40 Mg Tabs (Amlodipine-olmesartan) .... One by mouth once daily 13)  Doxycycline Hyclate 100 Mg Caps (Doxycycline hyclate) .Marland Kitchen.. 1 by mouth two times a day 14)  Prednisone 10 Mg Tabs (Prednisone) .... 4 tabs for 2 days, then 3 tabs for 2 days, 2 tabs for 2 days, then 1 tab for 2 days, then stop  Patient Instructions: 1)  Doxycycline 100mg  two times a day for 7 days 2)  Mucinex DM two times a day as needed cough/congestion  3)  Increase fluids. and rest.  4)  Prednisone taper over next week --to have on hold if symptoms worsen or do not improve with wheezing  5)  Please contact office for sooner follow up if symptoms do not improve or worsen  6)  follow up Dr. Delford Field in 6 weeks at [facility].  Prescriptions: HYDROMET 5-1.5 MG/5ML SYRP (HYDROCODONE-HOMATROPINE) 1-2 tsp every 6 hr as needed cough  #8 oz x 0    Entered and Authorized by:   Rubye Oaks NP   Signed by:   Tammy Parrett NP on 06/28/2010   Method used:   Print then Give to Patient   RxID:   [id number] PREDNISONE 10 MG TABS (PREDNISONE) 4 tabs for 2 days, then 3 tabs for 2 days, 2 tabs for 2 days, then 1 tab for 2 days, then stop  #20 x 0   Entered and Authorized by:   Rubye Oaks NP   Signed by:   Tammy Parrett NP on 06/28/2010   Method used:   Print then Give to Patient   RxID:   [id number] DOXYCYCLINE HYCLATE 100 MG CAPS (DOXYCYCLINE HYCLATE) 1 by mouth two times a day  #14 x 0   Entered and Authorized by:   Rubye Oaks NP   Signed by:   Tammy Parrett NP on 06/28/2010   Method used:   Electronically to        [id number] (retail)       [address]       [place], [place]  [address]       Ph: [phone] or [phone]       Fax: [phone]   RxID:   [id number]     Appended Document: cough/ wheezing sob/  mbw appointment scheduled 08-11-10 pt aware. jwr  Appended Document: cough/ wheezing sob/ mbw agree

## 2010-11-01 NOTE — Progress Notes (Signed)
Summary: pt souse call to set up appt   Phone Note Call from Patient Call back at Home Phone [phone]   Caller: Spouse Reason for Call: Talk to Nurse, Talk to Doctor Summary of Call: spouse called in response to letter you sent to set him up for some studies but I don't see any reminders in the system Initial call taken by: Omer Jack,  Feb 23, 2010 11:19 AM  Follow-up for Phone Call        test ordered and wife aware Deliah Goody, RN  Feb 25, 2010 4:55 PM

## 2010-11-01 NOTE — Progress Notes (Signed)
Summary: Needs OV  Phone Note Outgoing Call   Reason for Call: Confirm/change Appt Summary of Call: This pt has ruptured achilles tendon and needs surgical clearance  Call the pt and get him in to [facility] ASAP    Initial call taken by: Storm Frisk MD,  November 02, 2009 4:15 PM  Follow-up for Phone Call        called, spoke with pt.  Pt informed he needs an ov with PW for surgical clearance asap in [place].  Pt offered ov for tomorrow morning.  Pt states he has an appt with another doctor today and will call back to schedule ov after that appt. Will await pt's call back. Follow-up by: Gweneth Dimitri RN,  November 03, 2009 11:14 AM  Additional Follow-up for Phone Call Additional follow up Details #1::        pt has called and scheduled ov with PW on 11/18/09 in [place] with the schedulers.  will sign off on note.  Additional Follow-up by: Gweneth Dimitri RN,  November 04, 2009 8:49 AM

## 2010-11-01 NOTE — Assessment & Plan Note (Signed)
Summary: NPV/F/U RENAL AND ABI  Medications Added MULTIVITAMINS  TABS (MULTIPLE VITAMIN) Take 1 tablet by mouth once a day B COMPLEX  TABS (B COMPLEX VITAMINS) Take 1 tablet by mouth once a day PLETAL 100 MG TABS (CILOSTAZOL) take one tablet by mouth two times a day        Visit Type:  Initial Consult Referring Provider:  Dr Jens Som Primary Provider:  Dondra Spry DO  CC:  New PV evaluation-f/u renal and ABI.  History of Present Illness: 60 year-old man presenting for initial evaluation of AAA, renal atherosclerosis, and peripheral arterial disease. He also has a history of stroke in the 1990's.  The patient complains of bilateral calf pain with walking, occurs at 100 feet, he feels very limited from this. Legs are affected equally, and symptoms are present for about one year. No ulcerations or rest pain.   He also complains of exertional chest/epigastric pain but he is more limited by his leg pains. He complains of shortness of breath with exertion, occasional orthopnea, no PND. He has COPD and longstanding dyspnea, but continues to smoke cigarettes.  Denies orthopnea, PND, or edema. He complains of periodic dizziness but no history of syncope. He denies arm claudication symptoms but states that his left arm feels 'cool' on occasion.  Current Medications (verified): 1)  Dulera 200-5 Mcg/act Aero (Mometasone Furo-Formoterol Fum) .... 2 Puffs Twice Per Day 2)  Simvastatin 40 Mg  Tabs (Simvastatin) .... One By Mouth Once Daily 3)  Benicar 40 Mg Tabs (Olmesartan Medoxomil) .... Take One Tablet By Mouth Daily 4)  Sm Aspirin 325 Mg  Tabs (Aspirin) .... One By Mouth Once Daily 5)  Amitriptyline Hcl 25 Mg  Tabs (Amitriptyline Hcl) .... One By Mouth Bedtime 6)  Proair Hfa 108 (90 Base) Mcg/act  Aers (Albuterol Sulfate) .... One To Two Puff Every 4 Hours As Needed 7)  Hydrocodone-Acetaminophen 5-500 Mg Tabs (Hydrocodone-Acetaminophen) .... One By Mouth Two Times A Day Prn 8)  Hydromet 5-1.5  Mg/64ml Syrp (Hydrocodone-Homatropine) .Marland Kitchen.. 1-2 Tsp Every 6 Hr As Needed Cough 9)  Albuterol Sulfate (2.5 Mg/45ml) 0.083% Nebu (Albuterol Sulfate) .Marland Kitchen.. 1 Vial in Nebulizer Every 4-6 Hours As Needed For Wheezing 10)  Multivitamins  Tabs (Multiple Vitamin) .... Take 1 Tablet By Mouth Once A Day 11)  B Complex  Tabs (B Complex Vitamins) .... Take 1 Tablet By Mouth Once A Day  Allergies: 1)  ! Penicillin 2)  ! Cephalexin 3)  ! Avelox (Moxifloxacin Hcl)  Past History:  Past medical, surgical, family and social histories (including risk factors) reviewed, and no changes noted (except as noted below).  Past Medical History: Hypertension Hyperlipidemia AAA 5.0x4.8 BACK PAIN, LUMBAR  MIGRAINE HEADACHE COPD Golds Stage II     -Fev1 73%  FeV1FVC 49%  Rv 135%  DLCO 68% 03/2007 History of severe hypokalemia and hypomagnesemia secondary to alcohol abuse January of 2006.  Right Visual field cut with supranasal quadrantopia secondary to retinal artery occlusion History of alcoholic hepatitis in January of 2006. Status post trauma to the left eye 15 years ago. Left Achilles tendon rupture PAD with intermittent claudication, bilateral SFA occlusion Left subclavian stenosis by ultrasound  Past Surgical History: Reviewed history from 02/23/2010 and no changes required. Tonsillectomy  adenoidectomy  Family History: Reviewed history from 02/23/2010 and no changes required. Father died in his mid 41s secondary to myocardial infarction Mother died young age secondary to throat cancer     Social History: Reviewed history from 03/15/2010 and no  changes required. Occupation: Engineer, structural Married with two grown daughters  current smoker (3/4 ppd) Alcohol use-no; former abuse quit 2006 I   Review of Systems       Positive for chronic low back pain, otherwise negative except as per HPI  Vital Signs:  Patient profile:   60 year old male Height:      68 inches Weight:      180.50  pounds BMI:     27.54 Pulse rate:   68 / minute Pulse rhythm:   regular Resp:     18 per minute BP sitting:   132 / 72  (left arm) Cuff size:   large  Vitals Entered By: [name] (May 16, 2010 11:08 AM)  Serial Vital Signs/Assessments:  Time      Position  BP       Pulse  Resp  Temp     By           R Arm     140/72                         [name]   Physical Exam  General:  Pt is well-developed, alert and oriented, no acute distress HEENT: normal Neck: no thyromegaly           JVP normal, carotid upstrokes normal with bilateral bruits Lungs: CTA Chest: equal expansion  CV: Apical impulse nondisplaced, RRR without murmur or gallop Abd: soft, NT, positive BS, no HSM, abdominal bruit present Back: no CVA tenderness Ext: no clubbing, cyanosis, or edema        femoral pulses 2+ with bilateral bruits        pedal pulsesnot palpable        radial/brachial pulses 2+= Skin: warm, dry, no rash. No ulcerations on feet Neuro: CNII-XII intact,strength 5/5 = b/l    EKG  Procedure date:  03/24/2010  Findings:      ABI 0.7 bilaterally - R SFA flush occlusion reconstites mid SFA, left SFA occlusion 1.5 cm beyond ostium reconstitutes distal SFA.  Carotid Doppler  Procedure date:  03/24/2010  Findings:      40-59% bilateral ICA stenosis, severe left subclavian stenosis with retrograde vertebral flow.  Arterial Doppler  Procedure date:  03/24/2010  Findings:      Abdominal duplex: Diffuse aortic aneurysm/ectasia with maximum infrarenal dimension 5.2 cm. Severe left renal artery stenosis, probable severe right renal artery stenosis. Bilateral iliac dilatation.  Echocardiogram  Procedure date:  03/24/2010  Findings:      Left ventricle: The cavity size was normal. Wall thickness was       increased in a pattern of mild LVH. Systolic function was mildly       reduced. The estimated ejection fraction was 45%. Mild posterior       hypokinesis, moderate  hypokinesis of the peri-apical segments.       Features are consistent with a pseudonormal left ventricular       filling pattern, with concomitant abnormal relaxation and       increased filling pressure (grade 2 diastolic dysfunction).     - Aortic valve: There was no stenosis. Trivial regurgitation.     - Aorta: Mildly dilated aortic root.     - Mitral valve: Mildly calcified annulus. Trivial regurgitation.     - Right ventricle: The cavity size was normal. Systolic function was       normal.     - Pulmonary arteries: No  complete TR doppler jet was measured so       unable to estimate PA systolic pressure.     - Systemic veins: The IVC was not visualized.     Impressions:            - Normal LV size with mildly decreased systolic function, EF 45%.       The posterior wall was mildly hypokinetic. The peri-apical       segments were moderately hypokinetic. There were no significant       valvular abnormalities. The RV was normal in size and function.       Moderate diastolic dysfunction.  Impression & Recommendations:  Problem # 1:  ABDOMINAL AORTIC ANEURYSM (ICD-441.4) Pt with slowly enlarging AAA, now with maximum diameter in range of 5.0-5.2 cm. I reviewed his duplex ultrasound, and unfortunately the aneurysm appears to extend up to the celiac axis. This will likely exclude him as a candidate for EVAR. I discussed the importance of tobacco cessation as it relates to his diffuse vascular disease.  Problem # 2:  RENAL ATHEROSCLEROSIS (ICD-440.1) Pt with renal artery stenosis by duplex ultrasound. He has controlled blood pressure on antihypertensive therapy and his renal function has been ok. I would favor catheter angiography for evaluation of his AAA, renal arteries, and coronaries (see next problem for discussion). CTA would be a better assessment for his AAA, but I have not found it particularly helpful for renal atherosclerosis as it often overestimates the degree of ostial renal  stenosis because of calcification. Catheter angiography with the ability to measure pressure gradients is more reliable.   Problem # 3:  CHEST PAIN (ICD-786.50) Pt with exertional chest pain. He has segmental LV dysfunction by echo and has severe diffuse vascular disease. He is at very high risk and almost certainly has obstructive CAD. I reviewed Dr Ludwig Clarks note and I agree that the patient should have a cardiac cath. He understands but wishes to wait until the end of the calender year for insurance reasons. He will call if symptoms progress. I am going to schedule him back in f/u for January 2012 to discuss further.  His updated medication list for this problem includes:    Sm Aspirin 325 Mg Tabs (Aspirin) ..... One by mouth once daily    Pletal 100 Mg Tabs (Cilostazol) .Marland Kitchen... Take one tablet by mouth two times a day  Problem # 4:  CLAUDICATION (ICD-443.9) Bilateral SFA occlusion with flush occlusion on the right. Will assess at the time of angiography, but likely not a good candidate for revascularization (unfavorable anatomy for PTA and high cardiac/pulmonary risk with surgery). Will start Pletal for claudication.  Patient Instructions: 1)  Your physician has recommended you make the following change in your medication: START Pletal 100mg  take one-half tablet by mouth twice a day for 2 WEEKS, then increase to 100mg  one tablet by mouth twice a day 2)  Your physician wants you to follow-up in: January 2012.  You will receive a reminder letter in the mail two months in advance. If you don't receive a letter, please call our office to schedule the follow-up appointment. Prescriptions: PLETAL 100 MG TABS (CILOSTAZOL) take one tablet by mouth two times a day  #60 x 6   Entered by:   Julieta Gutting, RN, BSN   Authorized by:   Norva Karvonen, MD   Signed by:   Julieta Gutting, RN, BSN on 05/16/2010   Method used:   Electronically to  [id number] (retail)       [address]        [place], [place]  [address]       Ph: [phone] or [phone]       Fax: [phone]   RxID:   [id number]

## 2010-11-01 NOTE — Assessment & Plan Note (Signed)
Summary: Pulmonary OV   Primary Provider/Referring Provider:  Dondra Spry DO  CC:  3 wk follow up.  States breathing is better but still having SOB with activity and wheezing and chest tight in the mornings and evenings and productive cough with light gray mucus in mornings and white mucus during the day.  Marland Kitchen  History of Present Illness: 60 year old with known history of with known history of COPD , current smoker .  Golds Stage II  FeV1 73% DLCO 68%   September 03, 2008 11:31 AM  Since last OV 11/09  still coughing and wheezing. Got better wiht ABX and pred but as meds were finished pt worsened.  Still smoking 2PPD.  Mucous now dark green.  Pt is dyspneic in the PM.  No chest pain.  No postnasal drip.   Pt denies any significant sore throat, nasal congestion , fever, chills, sweats, unintended weight loss, pleurtic or exertional chest pain, orthopnea PND, or leg swelling  October 08, 2008 12:03 PM Markedly improved dyspnea.  Less cough,  no chest pain.   Pt is still smoking.  Dyspnea is less.  On symbicort.  Proair is as needed .  July 29, 2009--Presents for an acute office visit. Complains of prod cough with green mucus, increased SOB, wheezing x1week. Otc not helping. Denies chest pain, dyspnea, orthopnea, hemoptysis, fever, n/v/d, edema, headache. Doing well until this last week.   August 12, 2009 11:49 AM Pt not any better from 10/28 NP ov. Mucous is still green.  Pt still dyspneic with wheezing.  No chest pain.  No other issues. The pt is still smoking and failed chantix  September 02, 2009 --Pt improved on prednisone and abx but worsened off.  Pt is still smoking.  Pt is stressed at work. Pt with still cough productive of thick green.  No fever. Pt is still dyspneic. Pt is smoking 1/2 PPD  September 23, 2009--Returns with persistent symptoms of increased SOB, tightness in chest, wheezing, prod cough with clear-to-gray mucus x1week . He continues to smoke 1 ppd. Felt better on  higher dose of prednisone. currently on 10mg  once daily .CXR last visit was with no acute changes. Denies chest pain,  orthopnea, hemoptysis, fever, n/v/d, edema, headache.  October 07, 2009 11:48 AM Cough is better vs before,   grey  mucous in the am and then clears out. Still dyspneic with long walks  Still able to work. Quit on 10/02/09 The pt saw NP on 12/23 and to stay on pred 20mg /d  There was rx with pulse pred and dexilant for 10days  The thrush rx was given diflucan and has finished   Preventive Screening-Counseling & Management  Alcohol-Tobacco     Smoking Status: quit < 6 months     Year Quit: 10/02/09  Current Medications (verified): 1)  Symbicort 160-4.5 Mcg/act  Aero (Budesonide-Formoterol Fumarate) .... Two Puff Two Times A Day 2)  Simvastatin 40 Mg  Tabs (Simvastatin) .... One By Mouth Once Daily 3)  Benicar 20 Mg Tabs (Olmesartan Medoxomil) .... Once Daily 4)  Sm Aspirin 325 Mg  Tabs (Aspirin) .... One By Mouth Once Daily 5)  Amitriptyline Hcl 25 Mg  Tabs (Amitriptyline Hcl) .... One By Mouth Bedtime 6)  Proair Hfa 108 (90 Base) Mcg/act  Aers (Albuterol Sulfate) .... One To Two Puff Every 4 Hours As Needed 7)  Albuterol Sulfate (2.5 Mg/17ml) 0.083% Nebu (Albuterol Sulfate) .... Inhale 1 Vial Via Hhn Every 4-6 Hours  As Needed For Shortness of Breath 8)  Hydrocodone-Acetaminophen 5-500 Mg Tabs (Hydrocodone-Acetaminophen) .... One By Mouth Two Times A Day As Needed For Severe Headaches 9)  Cyclobenzaprine Hcl 5 Mg Tabs (Cyclobenzaprine Hcl) .... One By Mouth Qhs 10)  Qvar 80 Mcg/act Aers (Beclomethasone Dipropionate) .... 2 Puffs Two Times A Day-Using Sample From Last Ov Until Gone 11)  Hydrocodone-Homatropine 5-1.5 Mg/38ml Syrp (Hydrocodone-Homatropine) .... One Teaspoonful Every 4 Hours As Needed For Cough 12)  Prednisone 10 Mg Tabs (Prednisone) .... Take As Directed.  Allergies (verified): 1)  ! Penicillin 2)  ! Cephalexin  Past History:  Past medical, surgical, family  and social histories (including risk factors) reviewed, and no changes noted (except as noted below).  Past Medical History: Reviewed history from 09/02/2009 and no changes required. COPD Golds Stage II     -Fev1 73%  FeV1FVC 49%  Rv 135%  DLCO 68% 03/2007 Hyperlipidemia Hypertension   Right Visual field cut with supranasal quadrantopia    secondary to retinal artery occlusion AAA 4.2 x 3.8 Tobacco Abuse History of alcohol abuse   Past Surgical History: Reviewed history from 02/04/2009 and no changes required. Tonsillectomy    Family History: Reviewed history from 09/02/2009 and no changes required. Father died in his mid 43s secondary to massive myocardial infarction Mother died young age secondary to throat cancer    Social History: Reviewed history from 09/02/2009 and no changes required. Occupation: Engineer, structural Married with two grown daughters  current smoker x82yrs,  12 cigs a day Alcohol use-no  ISmoking Status:  quit < 6 months  Review of Systems       The patient complains of shortness of breath with activity and non-productive cough.  The patient denies shortness of breath at rest, productive cough, coughing up blood, chest pain, irregular heartbeats, acid heartburn, indigestion, loss of appetite, weight change, abdominal pain, difficulty swallowing, sore throat, tooth/dental problems, headaches, nasal congestion/difficulty breathing through nose, sneezing, itching, ear ache, anxiety, depression, hand/feet swelling, joint stiffness or pain, rash, change in color of mucus, and fever.    Vital Signs:  Patient profile:   60 year old male Height:      68 inches Weight:      174 pounds BMI:     26.55 O2 Sat:      97 % on Room air Temp:     98.2 degrees F oral Pulse rate:   97 / minute BP sitting:   144 / 74  (left arm) Cuff size:   regular  Vitals Entered By: Gweneth Dimitri RN (October 07, 2009 11:39 AM)  O2 Flow:  Room air CC: 3 wk follow up.  States  breathing is better but still having SOB with activity, wheezing and chest tight in the mornings and evenings and productive cough with light gray mucus in mornings and white mucus during the day.   Comments Medications reviewed with patient Gweneth Dimitri RN  October 07, 2009 11:39 AM    Physical Exam  Additional Exam:  Gen: WD WN      WM    in NAD    NCAT Heent:  no jvd, no TMG, no cervical LNademopathy, orophyx clear,  nares with clear watery drainage. Cor: RRR nl s1/s2  no s3/s4  no m r h g Abd: soft NT BSA   no masses  No HSM  no rebound or guarding Ext perfused with no c v e v.d Neuro: intact, moves all 4s, CN II-XII intact, DTRs intact  Chest: coarse BS with improved airflow Skin: clear  Genital/Rectal :deferred    Impression & Recommendations:  Problem # 1:  COPD (ICD-496) Assessment Improved Improved tracheobronchitis and COPD exac now better off tobacco. plan Continue to use nicotrol Stay on Symbicort and Qvar Taper prednisone slowly:  reduce to 1 1/2 a day for 5 days, then 1 a day for 5 days then 1 every other day for 5 days then stop Return 6 weeks [place]  Medications Added to Medication List This Visit: 1)  Benicar 20 Mg Tabs (Olmesartan medoxomil) .... Once daily 2)  Prednisone 10 Mg Tabs (Prednisone) .... Reduce to 1 1/2 daily for 5 days then 1 a day for 5 days then 1 every other day for 5 days then stop 3)  Nicotrol 10 Mg Inha (Nicotine) .... Use as directed  Complete Medication List: 1)  Symbicort 160-4.5 Mcg/act Aero (Budesonide-formoterol fumarate) .... Two puff two times a day 2)  Simvastatin 40 Mg Tabs (Simvastatin) .... One by mouth once daily 3)  Benicar 20 Mg Tabs (Olmesartan medoxomil) .... Once daily 4)  Sm Aspirin 325 Mg Tabs (Aspirin) .... One by mouth once daily 5)  Amitriptyline Hcl 25 Mg Tabs (Amitriptyline hcl) .... One by mouth bedtime 6)  Proair Hfa 108 (90 Base) Mcg/act Aers (Albuterol sulfate) .... One to two puff every 4 hours as  needed 7)  Cyclobenzaprine Hcl 5 Mg Tabs (Cyclobenzaprine hcl) .... One by mouth qhs 8)  Qvar 80 Mcg/act Aers (Beclomethasone dipropionate) .... 2 puffs two times a day-using sample from last ov until gone 9)  Hydrocodone-homatropine 5-1.5 Mg/76ml Syrp (Hydrocodone-homatropine) .... One teaspoonful every 4 hours as needed for cough 10)  Prednisone 10 Mg Tabs (Prednisone) .... Reduce to 1 1/2 daily for 5 days then 1 a day for 5 days then 1 every other day for 5 days then stop 11)  Nicotrol 10 Mg Inha (Nicotine) .... Use as directed  Other Orders: Est. Patient Level IV (16109)  Patient Instructions: 1)  Continue to use nicotrol 2)  Stay on Symbicort and Qvar 3)  Taper prednisone slowly:  reduce to 1 1/2 a day for 5 days, then 1 a day for 5 days then 1 every other day for 5 days then stop 4)  Return 6 weeks [place]

## 2010-11-01 NOTE — Progress Notes (Signed)
Summary: appt  Phone Note Call from Patient Call back at Home Phone [phone]   Caller: Spouse//charlette Call For: wright Summary of Call: Pt has appt on 5/19 @ 10:45a, wants to know if he can come in a half hour earlier. Initial call taken by: Darletta Moll,  Feb 15, 2010 11:13 AM  Follow-up for Phone Call        Spoke with pt's wife.  She is requesting rx to be earlier that morning-pt has appt at 930 with Dr. Artist Pais.  OV rescheduled to 02/17/10 at 1020 in [place].  Pt wife aware of date/time/location. Follow-up by: Gweneth Dimitri RN,  Feb 15, 2010 11:28 AM

## 2010-11-01 NOTE — Consult Note (Signed)
Summary: [facility]  [facility]   Imported By: Lanelle Bal 11/11/2009 11:25:10  _____________________________________________________________________  External Attachment:    Type:   Image     Comment:   External Document

## 2010-11-01 NOTE — Assessment & Plan Note (Signed)
Summary: SWOLLEN FOOT/[facility]   Vital Signs:  Patient profile:   60 year old male Weight:      173.50 pounds BMI:     26.48 O2 Sat:      96 % on Room air Temp:     97.8 degrees F oral Pulse rate:   92 / minute Pulse rhythm:   regular Resp:     20 per minute BP sitting:   124 / 70  Vitals Entered By: Glendell Docker CMA (October 28, 2009 4:10 PM)  O2 Flow:  Room air  Primary Care Provider:  D. Thomos Lemons DO  CC:  left foot pain.  History of Present Illness:  60 y/o white male for follow up.  Pt notes left achilles pain initially got better but then recently got worse.   He recently starting using boot.   c/o left foot pain 8/10 scale throbbing  he tried to decrease activity as possible  Allergies: 1)  ! Penicillin 2)  ! Cephalexin  Past History:  Past Medical History: COPD Golds Stage II     -Fev1 73%  FeV1FVC 49%  Rv 135%  DLCO 68% 03/2007 Hyperlipidemia Hypertension   Right Visual field cut with supranasal quadrantopia    secondary to retinal artery occlusion AAA 4.2 x 3.8 Tobacco Abuse  History of alcohol abuse   Past Surgical History: Tonsillectomy     Family History: Father died in his mid 58s secondary to massive myocardial infarction Mother died young age secondary to throat cancer     Social History: Occupation: Engineer, structural Married with two grown daughters  current smoker x87yrs,  12 cigs a day Alcohol use-no  I   Physical Exam  General:  alert, well-developed, and well-nourished.   Lungs:  decreased BS bilateral and prolonged exhilation.   Heart:  normal rate, regular rhythm, and no gallop.   Msk:  left achilles tenderness, left ankle is swollen   Impression & Recommendations:  Problem # 1:  ACHILLES TENDINITIS (ICD-726.71) Pt with worsening ankle pain.  I suspect achilles tendon tear.  continue to use immobiiizer.  arrange ortho eval Orders: Orthopedic Referral (Ortho)  Complete Medication List: 1)  Symbicort 160-4.5 Mcg/act Aero  (Budesonide-formoterol fumarate) .... Two puff two times a day 2)  Simvastatin 40 Mg Tabs (Simvastatin) .... One by mouth once daily 3)  Benicar 20 Mg Tabs (Olmesartan medoxomil) .... Once daily 4)  Sm Aspirin 325 Mg Tabs (Aspirin) .... One by mouth once daily 5)  Amitriptyline Hcl 25 Mg Tabs (Amitriptyline hcl) .... One by mouth bedtime 6)  Proair Hfa 108 (90 Base) Mcg/act Aers (Albuterol sulfate) .... One to two puff every 4 hours as needed 7)  Cyclobenzaprine Hcl 5 Mg Tabs (Cyclobenzaprine hcl) .... One by mouth qhs 8)  Qvar 80 Mcg/act Aers (Beclomethasone dipropionate) .... 2 puffs two times a day-using sample from last ov until gone 9)  Nicotrol 10 Mg Inha (Nicotine) .... Use as directed 10)  Hydrocodone-acetaminophen 5-500 Mg Tabs (Hydrocodone-acetaminophen) .... One by mouth two times a day prn  Patient Instructions: 1)  Our office will contact you re:  orthopedic referral. 2)  Rest, ice, compression (ACE), elevation of ankle. Prescriptions: HYDROCODONE-ACETAMINOPHEN 5-500 MG TABS (HYDROCODONE-ACETAMINOPHEN) one by mouth two times a day prn  #30 x 0   Entered and Authorized by:   D. Thomos Lemons DO   Signed by:   D. Thomos Lemons DO on 10/28/2009   Method used:   Print then Give to Patient  RxID:   [id number] CYCLOBENZAPRINE HCL 5 MG TABS (CYCLOBENZAPRINE HCL) one by mouth qhs  #30 x 1   Entered and Authorized by:   D. Thomos Lemons DO   Signed by:   D. Thomos Lemons DO on 10/28/2009   Method used:   Electronically to        [id number] (retail)       [address]       [place], [place]  [address]       Ph: [phone] or [phone]       Fax: [phone]   RxID:   [id number]     Current Allergies (reviewed today): ! PENICILLIN ! CEPHALEXIN

## 2010-11-01 NOTE — Assessment & Plan Note (Signed)
Summary: [facility]  Medications Added BENICAR 40 MG TABS (OLMESARTAN MEDOXOMIL) Take one tablet by mouth daily      Allergies Added:   Primary Provider:  Dondra Spry DO   History of Present Illness: 60 year old male for evaluation of chest pain. Previous carotid Dopplers in September of 2008 showed 0-39% right 40-59% left carotid stenosis. Echocardiogram in September of 2000 showed an ejection fraction of 50-55% with hypokinesis of the anterior and periapical wall. There was mild mitral regurgitation and left atrium was moderately dilated. In reviewing his chart there is a CT from February of 2008 that showed an abdominal aortic aneurysm measuring 4.2 x 3.8 cm. The patient has chronic dyspnea on exertion that he attributes to COPD. However over the past 2 months he he also notices a sharp pain in the epigastric area when he exerts himself and it is relieved with rest. It only occurs with more vigorous activities but not with routine activities around the house. It does not occur at rest. It does not radiate. There is shortness of breath but there is no nausea or vomiting and no diaphoresis. He also has pain in both calves with ambulating approximately one block. There is chronic dyspnea on exertion but no orthopnea or PND. There is occasional mild pedal edema. There is no syncope. He has also been noted to have a murmur. Because of the above were asked to further evaluate.  Current Medications (verified): 1)  Dulera 200-5 Mcg/act Aero (Mometasone Furo-Formoterol Fum) .... 2 Puffs Twice Per Day 2)  Simvastatin 40 Mg  Tabs (Simvastatin) .... One By Mouth Once Daily 3)  Benicar 20 Mg Tabs (Olmesartan Medoxomil) .... Once By Mouth Once Daily 4)  Sm Aspirin 325 Mg  Tabs (Aspirin) .... One By Mouth Once Daily 5)  Amitriptyline Hcl 25 Mg  Tabs (Amitriptyline Hcl) .... One By Mouth Bedtime 6)  Proair Hfa 108 (90 Base) Mcg/act  Aers (Albuterol Sulfate) .... One To Two Puff Every 4 Hours As  Needed 7)  Hydrocodone-Acetaminophen 5-500 Mg Tabs (Hydrocodone-Acetaminophen) .... One By Mouth Two Times A Day Prn 8)  Chantix Starting Month Pak 0.5 Mg X 11 & 1 Mg X 42 Tabs (Varenicline Tartrate) .... Use As Directed 9)  Chantix Continuing Month Pak 1 Mg Tabs (Varenicline Tartrate) .... Use As Directed  Allergies (verified): 1)  ! Penicillin 2)  ! Cephalexin 3)  ! Avelox (Moxifloxacin Hcl)  Past History:  Past Medical History: Hypertension Hyperlipidemia AAA 4.2 x 3.8 BACK PAIN, LUMBAR  MIGRAINE HEADACHE COPD Golds Stage II     -Fev1 73%  FeV1FVC 49%  Rv 135%  DLCO 68% 03/2007 History of severe hypokalemia and hypomagnesemia secondary to alcohol abuse January of 2006.  Right Visual field cut with supranasal quadrantopia secondary to retinal artery occlusion History of alcoholic hepatitis in January of 2006. Status post trauma to the left eye 15 years ago. Left Achilles tendon rupture  Past Surgical History: Tonsillectomy  adenoidectomy  Family History: Reviewed history from 10/28/2009 and no changes required. Father died in his mid 63s secondary to myocardial infarction Mother died young age secondary to throat cancer     Social History: Reviewed history from 10/28/2009 and no changes required. Occupation: Engineer, structural Married with two grown daughters  current smoker (1 ppd) Alcohol use-no; former abuse I   Review of Systems       Chronic cough but no fevers or chills, hemoptysis, dysphasia, odynophagia, melena, hematochezia, dysuria, hematuria, rash, seizure activity, orthopnea, PND. Remaining  systems are negative.   Vital Signs:  Patient profile:   60 year old male Height:      68 inches Weight:      179 pounds BMI:     27.32 Pulse rate:   66 / minute Pulse rhythm:   regular BP sitting:   152 / 60  (left arm) Cuff size:   regular  Vitals Entered By: Deliah Goody, RN (Feb 23, 2010 8:08 AM)  Physical Exam  General:  Well developed/well  nourished in NAD Skin warm/dry Patient not depressed Peripheral clubbing Back-normal HEENT-normal/normal eyelids Neck supple/normal carotid upstroke bilaterally; bilateral bruits; no JVD; no thyromegaly chest - diminished breath sounds throughout CV - RRR/normal S1; no rubs or gallops;  PMI nondisplaced; 2/6 systolic murmur left sternal border. S2 is diminished. Abdomen -NT/ND, no HSM, no mass, + bowel sounds, no bruit 2+ femoral pulses, bilateral bruits Ext-trace to 1+ edema, no chords, diminished DP Neuro-grossly nonfocal     EKG  Procedure date:  02/23/2010  Findings:      Sinus rhythm at a rate of 66. Left anterior fascicular block. Prior septal infarct. Inferior lateral T-wave inversion.  Impression & Recommendations:  Problem # 1:  CHEST PAIN (ICD-786.50) His symptoms are consistent with angina. His electrocardiogram shows a prior anterior infarct. I have recommended cardiac catheterization. The risks and benefits have been discussed. The patient is somewhat hesitant and would prefer to discuss this with his wife first. I explained the risk of undiagnosed coronary disease including myocardial infarction and death. He understands this and will contact us if agreeable. Continue aspirin, ARB and statin. I will not add a beta blocker at this point as he has significant COPD and wheezing. His updated medication list for this problem includes:    Sm Aspirin 325 Mg Tabs (Aspirin) ..... One by mouth once daily  Problem # 2:  CEREBROVASCULAR DISEASE (ICD-437.9) Continue aspirin and statin. I recommended followup carotid Dopplers and he again will discuss this with his wife. He understands the risk of CVA with undiagnosed progressive cerebrovascular disease.  Problem # 3:  MURMUR (ICD-785.2) Probable aortic stenosis murmur. I recommended echocardiogram. He will let us know if he is agreeable. His updated medication list for this problem includes:    Benicar 40 Mg Tabs (Olmesartan  medoxomil) .Marland Kitchen... Take one tablet by mouth daily  Problem # 4:  CLAUDICATION (ICD-443.9) Significant femoral bruits and also claudication. I recommended ABIs today. He will consider this.  Problem # 5:  TOBACCO ABUSE (ICD-305.1) Discussed the importance of discontinuing for between 3-10 minutes..  Problem # 6:  ABDOMINAL AORTIC ANEURYSM (ICD-441.4) I recommended followup abdominal ultrasound. He will consider this. I explained risk of sudden death with associated aneurysm rupture.  Problem # 7:  HYPERTENSION (ICD-401.9) Blood pressure elevated. Increase Benicar to 40 mg p.o. daily. Check bmet in one week. His updated medication list for this problem includes:    Benicar 40 Mg Tabs (Olmesartan medoxomil) .Marland Kitchen... Take one tablet by mouth daily    Sm Aspirin 325 Mg Tabs (Aspirin) ..... One by mouth once daily  Problem # 8:  HYPERLIPIDEMIA (ICD-272.4) Continue statin. His updated medication list for this problem includes:    Simvastatin 40 Mg Tabs (Simvastatin) ..... One by mouth once daily  Problem # 9:  COPD (ICD-496)  His updated medication list for this problem includes:    Dulera 200-5 Mcg/act Aero (Mometasone furo-formoterol fum) .Marland Kitchen... 2 puffs twice per day    Proair Hfa 108 (90 Base) Mcg/act  Aers (Albuterol sulfate) ..... One to two puff every 4 hours as needed  Patient Instructions: 1)  Your physician has requested that you have an abdominal aorta duplex. During this test, an ultrasound is used to evaluate the aorta. Allow 30 minutes for this exam. Do not eat after midnight the day before and avoid carbonated beverages. There are no restrictions or special instructions. 2)  Your physician has requested that you have an ankle brachial index (ABI). During this test an ultrasound and blood pressure cuff are used to evaluate the arteries that supply the arms and legs with blood. Allow thirty minutes for this exam. There are no restrictions or special instructions. 3)  Your physician has  requested that you have a carotid duplex. This test is an ultrasound of the carotid arteries in your neck. It looks at blood flow through these arteries that supply the brain with blood. Allow one hour for this exam. There are no restrictions or special instructions. 4)  Your physician has requested that you have an echocardiogram.  Echocardiography is a painless test that uses sound waves to create images of your heart. It provides your doctor with information about the size and shape of your heart and how well your heart's chambers and valves are working.  This procedure takes approximately one hour. There are no restrictions for this procedure. 5)  Your physician has requested that you have a cardiac catheterization.  Cardiac catheterization is used to diagnose and/or treat various heart conditions. Doctors may recommend this procedure for a number of different reasons. The most common reason is to evaluate chest pain. Chest pain can be a symptom of coronary artery disease (CAD), and cardiac catheterization can show whether plaque is narrowing or blocking your heart's arteries. This procedure is also used to evaluate the valves, as well as measure the blood flow and oxygen levels in different parts of your heart.  For further information please visit [email or web address]  Please follow instruction sheet, as given. 6)  INCREASE BENICAR 40MG  ONE TABLET ONCE DAILY Prescriptions: BENICAR 40 MG TABS (OLMESARTAN MEDOXOMIL) Take one tablet by mouth daily  #30 x 12   Entered by:   Deliah Goody, RN   Authorized by:   Ferman Hamming, MD, [facility]   Signed by:   Deliah Goody, RN on 02/23/2010   Method used:   Electronically to        [id number] ([facility])       [address]       [place], [place]  [address]       Ph: [phone] or [phone]       Fax: [phone]   RxID:   [id number]

## 2010-11-01 NOTE — Progress Notes (Signed)
Summary: Simvastatin Refill  Phone Note Refill Request Message from:  Fax from Pharmacy on Feb 18, 2010 12:48 PM  Refills Requested: Medication #1:  SIMVASTATIN 40 MG  TABS one by mouth once daily   Dosage confirmed as above?Dosage Confirmed   Brand Name Necessary? No   Supply Requested: 1 month   Last Refilled: 01/17/2010  Method Requested: Electronic Next Appointment Scheduled: None Initial call taken by: Glendell Docker CMA,  Feb 18, 2010 12:49 PM    Prescriptions: SIMVASTATIN 40 MG  TABS (SIMVASTATIN) one by mouth once daily  #30 x 5   Entered by:   Glendell Docker CMA   Authorized by:   D. Thomos Lemons DO   Signed by:   Glendell Docker CMA on 02/18/2010   Method used:   Electronically to        [id number] ([facility]ail)       [address]       [place], [place]  [address]       Ph: [phone] or [phone]       Fax: [phone]   RxID:   [id number]

## 2010-11-01 NOTE — Assessment & Plan Note (Signed)
Summary: Pulmonary OV   Copy to:  Dr Jens Som Primary Provider/Referring Provider:  D. Thomos Lemons DO  CC:  6 wk follow up.  chest congestion, prod cough with green mucus, wheezing, chest tightness x 3 day.  Denies increased SOB, and f/c/s.  Marland Kitchen  History of Present Illness: 60 year old with known history of with known history of COPD , current smoker .  Golds Stage II  FeV1 73% DLCO 68%   September 03, 2008 11:31 AM  Since last OV 11/09  still coughing and wheezing. Got better wiht ABX and pred but as meds were finished pt worsened.  Still smoking 2PPD.  Mucous now dark green.  Pt is dyspneic in the PM.  No chest pain.  No postnasal drip.   Pt denies any significant sore throat, nasal congestion , fever, chills, sweats, unintended weight loss, pleurtic or exertional chest pain, orthopnea PND, or leg swelling  October 08, 2008 12:03 PM Markedly improved dyspnea.  Less cough,  no chest pain.   Pt is still smoking.  Dyspnea is less.  On symbicort.  Proair is as needed .  July 29, 2009--Presents for an acute office visit. Complains of prod cough with green mucus, increased SOB, wheezing x1week. Otc not helping. Denies chest pain, dyspnea, orthopnea, hemoptysis, fever, n/v/d, edema, headache. Doing well until this last week.   August 12, 2009 11:49 AM Pt not any better from 10/28 NP ov. Mucous is still green.  Pt still dyspneic with wheezing.  No chest pain.  No other issues. The pt is still smoking and failed chantix  September 02, 2009 --Pt improved on prednisone and abx but worsened off.  Pt is still smoking.  Pt is stressed at work. Pt with still cough productive of thick green.  No fever. Pt is still dyspneic. Pt is smoking 1/2 PPD  September 23, 2009--Returns with persistent symptoms of increased SOB, tightness in chest, wheezing, prod cough with clear-to-gray mucus x1week . He continues to smoke 1 ppd. Felt better on higher dose of prednisone. currently on 10mg  once daily .CXR last  visit was with no acute changes. Denies chest pain,  orthopnea, hemoptysis, fever, n/v/d, edema, headache.  October 07, 2009 11:48 AM Cough is better vs before,   grey  mucous in the am and then clears out. Still dyspneic with long walks  Still able to work. Quit on 10/02/09 The pt saw NP on 12/23 and to stay on pred 20mg /d  There was rx with pulse pred and dexilant for 10days  The thrush rx was given diflucan and has finished   Feb 17, 2010 10:54 AM Was able to quit.  used the nicotrol tried the chantix,  got irritable.   achilles tendon rupture rx wiht cast and healed. healed secondarily Now has cough and is prod of clear mucous.  Back to smoking. dyspnea never improved that much and now sl worse. if get out of truck will worsen  03/15/10--Presents for an acute office visit. Complains of prod cough with white/gray mucus, wheezing, increased SOB, feverish x5days - has been taking 50mg  prednisone x3days. Had left over steroids at home. Mucinex not helping.    June 28, 2010 --Presents for an acute office visit. Complains of  chest congestion, increased SOB, productive cough with dark gray to green, post nasal drip for last 5 days. Taken advil as needed . Denies chest pain,  orthopnea, hemoptysis, fever, n/v/d, edema, headache.  has been doing ok until last  week. Wife had similar symptoms 1 week ago.   August 11, 2010 12:05 PM Ok until developed URI few days ago.  Now:  coughing more mucus that is green Tight in chest and wheeze.  No f/c/s.  More dyspnea. More PN drip. When got pred and doxy at last acute ov this helped   Preventive Screening-Counseling & Management  Alcohol-Tobacco     Smoking Status: quit < 6 months     Year Quit: 03/2010   Current Medications (verified): 1)  Dulera 200-5 Mcg/act Aero (Mometasone Furo-Formoterol Fum) .... 2 Puffs Twice Per Day 2)  Simvastatin 40 Mg  Tabs (Simvastatin) .... One By Mouth Once Daily 3)  Sm Aspirin 325 Mg  Tabs (Aspirin) .... One  By Mouth Once Daily 4)  Amitriptyline Hcl 25 Mg  Tabs (Amitriptyline Hcl) .... One By Mouth Bedtime 5)  Proair Hfa 108 (90 Base) Mcg/act  Aers (Albuterol Sulfate) .... One To Two Puff Every 4 Hours As Needed 6)  Hydrocodone-Acetaminophen 7.5-500 Mg Tabs (Hydrocodone-Acetaminophen) .... One By Mouth Two Times A Day As Needed 7)  Hydromet 5-1.5 Mg/63ml Syrp (Hydrocodone-Homatropine) .Marland Kitchen.. 1-2 Tsp Every 6 Hr As Needed Cough 8)  Albuterol Sulfate (2.5 Mg/55ml) 0.083% Nebu (Albuterol Sulfate) .Marland Kitchen.. 1 Vial in Nebulizer Every 4-6 Hours As Needed For Wheezing 9)  Multivitamins  Tabs (Multiple Vitamin) .... Take 1 Tablet By Mouth Once A Day 10)  B Complex  Tabs (B Complex Vitamins) .... Take 1 Tablet By Mouth Once A Day 11)  Pletal 100 Mg Tabs (Cilostazol) .... Take One Tablet By Mouth Two Times A Day 12)  Azor 5-40 Mg Tabs (Amlodipine-Olmesartan) .... One By Mouth Once Daily  Allergies (verified): 1)  ! Penicillin 2)  ! Cephalexin 3)  ! Avelox (Moxifloxacin Hcl)  Past History:  Past medical, surgical, family and social histories (including risk factors) reviewed, and no changes noted (except as noted below).  Past Medical History: Reviewed history from 06/16/2010 and no changes required. Hypertension Hyperlipidemia AAA 5.0x4.8  BACK PAIN, LUMBAR  MIGRAINE HEADACHE COPD Golds Stage II     -Fev1 73%  FeV1FVC 49%  Rv 135%  DLCO 68% 03/2007 History of severe hypokalemia and hypomagnesemia secondary to alcohol abuse January of 2006.  Right Visual field cut with supranasal quadrantopia secondary to retinal artery occlusion History of alcoholic hepatitis in January of 2006. Status post trauma to the left eye 15 years ago. Left Achilles tendon rupture PAD with intermittent claudication, bilateral SFA occlusion Left subclavian stenosis by ultrasound  Past Surgical History: Reviewed history from 06/16/2010 and no changes required. Tonsillectomy  adenoidectomy   Family History: Reviewed history  from 06/16/2010 and no changes required. Father died in his mid 2s secondary to myocardial infarction Mother died young age secondary to throat cancer      Social History: Reviewed history from 06/16/2010 and no changes required. Occupation: retired, was let go at work 06/2010 Married with two grown daughters  current smoker - using e cigarettes Alcohol use-no; former abuse quit 2006 Smoking Status:  quit < 6 months   Review of Systems       The patient complains of shortness of breath with activity, shortness of breath at rest, productive cough, non-productive cough, and change in color of mucus.  The patient denies coughing up blood, chest pain, irregular heartbeats, acid heartburn, indigestion, loss of appetite, weight change, abdominal pain, difficulty swallowing, sore throat, tooth/dental problems, headaches, nasal congestion/difficulty breathing through nose, sneezing, itching, ear ache, anxiety, depression, hand/feet swelling,  joint stiffness or pain, rash, and fever.    Vital Signs:  Patient profile:   60 year old male Height:      68 inches Weight:      165 pounds BMI:     25.18 O2 Sat:      99 % on Room air Temp:     97.8 degrees F oral Pulse rate:   95 / minute BP sitting:   140 / 80  (left arm) Cuff size:   regular  Vitals Entered By: Gweneth Dimitri RN (August 11, 2010 11:49 AM)  O2 Flow:  Room air CC: 6 wk follow up.  chest congestion, prod cough with green mucus, wheezing, chest tightness x 3 day.  Denies increased SOB, f/c/s.   Comments Medications reviewed with patient Daytime contact number verified with patient. Crystal Jones RN  August 11, 2010 11:50 AM    Physical Exam  Additional Exam:  Gen: WD WN      WM    in NAD    NCAT Heent:  no jvd, no TMG, no cervical LNademopathy, orophyx clear,  nares with clear watery drainage. Cor: RRR nl s1/s2  no s3/s4  no m r h g Abd: soft NT BSA   no masses  No HSM  no rebound or guarding Ext perfused with no c v e  v.d Neuro: intact, moves all 4s, CN II-XII intact, DTRs intact Chest: coarse BS with few exp wheezing  Skin: clear  Genital/Rectal :deferred    Impression & Recommendations:  Problem # 1:  CHRONIC OBSTRUCTIVE PULMONARY DISEASE, ACUTE EXACERBATION (ICD-491.21) Assessment Deteriorated copd exac, recent smoking  plan doxy x 7days pulse pred  cont inhaled meds Orders: Est. Patient Level IV (91478)  Medications Added to Medication List This Visit: 1)  Doxycycline Hyclate 100 Mg Caps (Doxycycline hyclate) .... One by mouth two times a day 2)  Prednisone 10 Mg Tabs (Prednisone) .... Take as directed 4 each am x3days, 3 x 3days, 2 x 3days, 1 x 3days then stop  Complete Medication List: 1)  Dulera 200-5 Mcg/act Aero (Mometasone furo-formoterol fum) .... 2 puffs twice per day 2)  Simvastatin 40 Mg Tabs (Simvastatin) .... One by mouth once daily 3)  Sm Aspirin 325 Mg Tabs (Aspirin) .... One by mouth once daily 4)  Amitriptyline Hcl 25 Mg Tabs (Amitriptyline hcl) .... One by mouth bedtime 5)  Proair Hfa 108 (90 Base) Mcg/act Aers (Albuterol sulfate) .... One to two puff every 4 hours as needed 6)  Hydrocodone-acetaminophen 7.5-500 Mg Tabs (Hydrocodone-acetaminophen) .... One by mouth two times a day as needed 7)  Hydromet 5-1.5 Mg/67ml Syrp (Hydrocodone-homatropine) .Marland Kitchen.. 1-2 tsp every 6 hr as needed cough 8)  Albuterol Sulfate (2.5 Mg/47ml) 0.083% Nebu (Albuterol sulfate) .Marland Kitchen.. 1 vial in nebulizer every 4-6 hours as needed for wheezing 9)  Multivitamins Tabs (Multiple vitamin) .... Take 1 tablet by mouth once a day 10)  B Complex Tabs (B complex vitamins) .... Take 1 tablet by mouth once a day 11)  Pletal 100 Mg Tabs (Cilostazol) .... Take one tablet by mouth two times a day 12)  Azor 5-40 Mg Tabs (Amlodipine-olmesartan) .... One by mouth once daily 13)  Doxycycline Hyclate 100 Mg Caps (Doxycycline hyclate) .... One by mouth two times a day 14)  Prednisone 10 Mg Tabs (Prednisone) .... Take as  directed 4 each am x3days, 3 x 3days, 2 x 3days, 1 x 3days then stop  Patient Instructions: 1)  Prednisone 10mg  4 each am x3days,  3 x 3days, 2 x 3days, 1 x 3days then stop 2)  Doxycycline one twice daily for 7days 3)  Refills given for proair/dulera 4)  Return 2 months  Prescriptions: HYDROMET 5-1.5 MG/5ML SYRP (HYDROCODONE-HOMATROPINE) 1-2 tsp every 6 hr as needed cough  #150 ML x 0   Entered and Authorized by:   Storm Frisk MD   Signed by:   Storm Frisk MD on 08/11/2010   Method used:   Print then Give to Patient   RxID:   [id number] PREDNISONE 10 MG  TABS (PREDNISONE) Take as directed 4 each am x3days, 3 x 3days, 2 x 3days, 1 x 3days then stop  #30 x 0   Entered and Authorized by:   Storm Frisk MD   Signed by:   Storm Frisk MD on 08/11/2010   Method used:   Electronically to        [id number] (retail)       [address]       [place], [place]  [address]       Ph: [phone] or [phone]       Fax: [phone]   RxID:   [id number] DOXYCYCLINE HYCLATE 100 MG CAPS (DOXYCYCLINE HYCLATE) One by mouth two times a day  #14 x 0   Entered and Authorized by:   Storm Frisk MD   Signed by:   Storm Frisk MD on 08/11/2010   Method used:   Electronically to        [id number] (retail)       [address]       [place], [place]  [address]       Ph: [phone] or [phone]       Fax: [phone]   RxID:   [id number]

## 2010-11-01 NOTE — Assessment & Plan Note (Signed)
Summary: 2 month follow up/mhf   Vital Signs:  Patient profile:   60 year old male Height:      68 inches Weight:      164.75 pounds BMI:     25.14 O2 Sat:      100 % on Room air Temp:     97.9 degrees F oral Pulse rate:   86 / minute Resp:     18 per minute BP sitting:   100 / 60  (left arm) Cuff size:   regular  Vitals Entered By: Glendell Docker CMA (August 15, 2010 11:33 AM)  O2 Flow:  Room air CC: 2 Month follow up Is Patient Diabetic? No Pain Assessment Patient in pain? no      Comments request written rx refills. no concerns   Primary Care Provider:  Dondra Spry DO  CC:  2 Month follow up.  History of Present Illness:  Hypertension Follow-Up      This is a 60 year old man who presents for Hypertension follow-up.  The patient denies lightheadedness.  The patient denies the following associated symptoms: chest pain.  Compliance with medications (by patient report) has been near 100%.    patient has chronic headaches and intermittent back pain  No change in smoking status  Preventive Screening-Counseling & Management  Alcohol-Tobacco     Smoking Status: current     Smoking Cessation Counseling: yes  Allergies: 1)  ! Penicillin 2)  ! Cephalexin 3)  ! Avelox (Moxifloxacin Hcl)  Past History:  Past Medical History: Hypertension Hyperlipidemia AAA 5.0x4.8   BACK PAIN, LUMBAR  MIGRAINE HEADACHE COPD Golds Stage II     -Fev1 73%  FeV1FVC 49%  Rv 135%  DLCO 68% 03/2007 History of severe hypokalemia and hypomagnesemia secondary to alcohol abuse January of 2006.  Right Visual field cut with supranasal quadrantopia secondary to retinal artery occlusion History of alcoholic hepatitis in January of 2006. Status post trauma to the left eye 15 years ago. Left Achilles tendon rupture PAD with intermittent claudication, bilateral SFA occlusion Left subclavian stenosis by ultrasound  Past Surgical History: Tonsillectomy  adenoidectomy    Family  History: Father died in his mid 39s secondary to myocardial infarction Mother died young age secondary to throat cancer       Social History: Occupation: retired, was let go at work 06/2010 Married with two grown daughters  current smoker - using e cigarettes Alcohol use-no; former abuse quit 2006  Smoking Status:  current  Physical Exam  General:  alert, well-developed, and well-nourished.   Neck:  supple, no masses, and no carotid bruits.   Lungs:  normal respiratory effort.  prolonged expiration Heart:  normal rate, regular rhythm, and no gallop.   Extremities:  1+ left pedal edema and 1+ right pedal edema.   Psych:  normally interactive, good eye contact, not anxious appearing, and not depressed appearing.     Impression & Recommendations:  Problem # 1:  BACK PAIN, LUMBAR (ICD-724.2) Assessment Unchanged  His updated medication list for this problem includes:    Sm Aspirin 325 Mg Tabs (Aspirin) ..... One by mouth once daily    Hydrocodone-acetaminophen 7.5-500 Mg Tabs (Hydrocodone-acetaminophen) ..... One by mouth two times a day as needed  Problem # 2:  HYPERTENSION (ICD-401.9) Assessment: Improved  His updated medication list for this problem includes:    Azor 5-40 Mg Tabs (Amlodipine-olmesartan) ..... One by mouth once daily  BP today: 100/60 Prior BP: 140/80 (08/11/2010)  Labs Reviewed:  K+: 5.1 (09/02/2009) Creat: : 1.04 (09/02/2009)   Chol: 167 (09/02/2009)   HDL: 38 (09/02/2009)   LDL: 95 (09/02/2009)   TG: 171 (09/02/2009)  Complete Medication List: 1)  Dulera 200-5 Mcg/act Aero (Mometasone furo-formoterol fum) .... 2 puffs twice per day 2)  Simvastatin 40 Mg Tabs (Simvastatin) .... One by mouth once daily 3)  Sm Aspirin 325 Mg Tabs (Aspirin) .... One by mouth once daily 4)  Amitriptyline Hcl 25 Mg Tabs (Amitriptyline hcl) .... One by mouth bedtime 5)  Proair Hfa 108 (90 Base) Mcg/act Aers (Albuterol sulfate) .... One to two puff every 4 hours as needed 6)   Hydrocodone-acetaminophen 7.5-500 Mg Tabs (Hydrocodone-acetaminophen) .... One by mouth two times a day as needed 7)  Hydromet 5-1.5 Mg/34ml Syrp (Hydrocodone-homatropine) .Marland Kitchen.. 1-2 tsp every 6 hr as needed cough 8)  Albuterol Sulfate (2.5 Mg/19ml) 0.083% Nebu (Albuterol sulfate) .Marland Kitchen.. 1 vial in nebulizer every 4-6 hours as needed for wheezing 9)  Multivitamins Tabs (Multiple vitamin) .... Take 1 tablet by mouth once a day 10)  B Complex Tabs (B complex vitamins) .... Take 1 tablet by mouth once a day 11)  Pletal 100 Mg Tabs (Cilostazol) .... Take one tablet by mouth two times a day 12)  Azor 5-40 Mg Tabs (Amlodipine-olmesartan) .... One by mouth once daily  Patient Instructions: 1)  Please schedule a follow-up appointment in 3 months. Prescriptions: HYDROCODONE-ACETAMINOPHEN 7.5-500 MG TABS (HYDROCODONE-ACETAMINOPHEN) one by mouth two times a day as needed  #60 x 2   Entered and Authorized by:   D. Thomos Lemons DO   Signed by:   D. Thomos Lemons DO on 08/15/2010   Method used:   Print then Give to Patient   RxID:   [id number] AZOR 5-40 MG TABS (AMLODIPINE-OLMESARTAN) one by mouth once daily  #90 x 1   Entered and Authorized by:   D. Thomos Lemons DO   Signed by:   D. Thomos Lemons DO on 08/15/2010   Method used:   Print then Give to Patient   RxID:   [id number] AMITRIPTYLINE HCL 25 MG  TABS (AMITRIPTYLINE HCL) one by mouth bedtime  #90 Tablet x 1   Entered and Authorized by:   D. Thomos Lemons DO   Signed by:   D. Thomos Lemons DO on 08/15/2010   Method used:   Print then Give to Patient   RxID:   [id number] SIMVASTATIN 40 MG  TABS (SIMVASTATIN) one by mouth once daily  #90 x 1   Entered and Authorized by:   D. Thomos Lemons DO   Signed by:   D. Thomos Lemons DO on 08/15/2010   Method used:   Print then Give to Patient   RxID:   [id number]    Orders Added: 1)  Est. Patient Level III [42595]

## 2010-11-01 NOTE — Assessment & Plan Note (Signed)
Summary: NEED REFILL ON MEDICATION/HEA   Vital Signs:  Patient profile:   60 year old male Height:      68 inches Weight:      173 pounds BMI:     26.40 O2 Sat:      100 % on Room air Temp:     98.0 degrees F oral Pulse rate:   68 / minute Pulse rhythm:   regular Resp:     20 per minute BP sitting:   132 / 82  (right arm) Cuff size:   regular  Vitals Entered By: Glendell Docker CMA (Feb 17, 2010 10:00 AM)  O2 Flow:  Room air CC: Rm 2- Medication Refill Is Patient Diabetic? No   Primary Care Provider:  Dondra Spry DO  CC:  Rm 2- Medication Refill.  History of Present Illness: 60 y/o white male with hx of COPD, ongoing tob abuse, AAA, hyperlipidemia for f/u  interval hx: patient missed several weeks of using his maintenance inhalers patient reports wheezing chest tightness and shortness of breath He also complains of exertional chest pain no change in tobacco use  Achilles tendon pain - managed nonsurgically Presumed partial tear.   Symptoms attributed to use a fluoroquinolone (Avelox)   Preventive Screening-Counseling & Management  Alcohol-Tobacco     Smoking Status: current  Allergies: 1)  ! Penicillin 2)  ! Cephalexin 3)  ! Avelox (Moxifloxacin Hcl)  Past History:  Past Medical History: COPD Golds Stage II     -Fev1 73%  FeV1FVC 49%  Rv 135%  DLCO 68% 03/2007 Hyperlipidemia Hypertension    Right Visual field cut with supranasal quadrantopia    secondary to retinal artery occlusion AAA 4.2 x 3.8 Tobacco Abuse  History of alcohol abuse  Left Achilles tendon rupture  Past Surgical History: Tonsillectomy   Social History: Smoking Status:  current  Review of Systems       The patient complains of chest pain, dyspnea on exertion, and prolonged cough.  The patient denies weight loss and weight gain.    Physical Exam  General:  alert, well-developed, and well-nourished.   Neck:  supple, no masses, and no carotid bruits.   Lungs:  decreased BS  bilateral and prolonged exhalation.  bilateral exp wheeze  Heart:  SEM 2 /6 RSB, normal rate, regular rhythm, and no gallop.   Abdomen:  soft, non-tender, and normal bowel sounds.   Extremities:  1+ left pedal edema and 1+ right pedal edema.   Neurologic:  cranial nerves II-XII intact and gait normal.   Psych:  normally interactive, good eye contact, not anxious appearing, and not depressed appearing.     Impression & Recommendations:  Problem # 1:  HYPERTENSION (ICD-401.9) Stable.  Maintain current medication regimen.  His updated medication list for this problem includes:    Benicar 20 Mg Tabs (Olmesartan medoxomil) ..... Once by mouth once daily  BP today: 132/82 Prior BP: 124/70 (10/28/2009)  Labs Reviewed: K+: 5.1 (09/02/2009) Creat: : 1.04 (09/02/2009)   Chol: 167 (09/02/2009)   HDL: 38 (09/02/2009)   LDL: 95 (09/02/2009)   TG: 171 (09/02/2009)  Problem # 2:  ABDOMINAL AORTIC ANEURYSM (ICD-17.51) 60 year old smoker with history of AAA 4.2 x 3.8.  obtain abdominal ultrasound for surveillance.  Continue blood pressure control and statin. Patient strongly advised to discontinue smoking. Restart Chantix Orders: Ultrasound (Ultrasound)  Problem # 3:  COPD (ICD-496) Assessment: Deteriorated poor compliance.  restart maintenance inhalers.  followed by Dr. Delford Field  His updated medication  list for this problem includes:    Dulera 200-5 Mcg/act Aero (Mometasone furo-formoterol fum) .Marland Kitchen... 2 puffs twice per day    Proair Hfa 108 (90 Base) Mcg/act Aers (Albuterol sulfate) ..... One to two puff every 4 hours as needed  Problem # 4:  TOBACCO ABUSE (ICD-305.1) chantix caused irritability and previously used.  benefits outweigh risk.  restart chantix  The following medications were removed from the medication list:    Nicotrol 10 Mg Inha (Nicotine) ..... Use as directed His updated medication list for this problem includes:    Chantix Starting Month Pak 0.5 Mg X 11 & 1 Mg X 42 Tabs  (Varenicline tartrate) ..... Use as directed    Chantix Continuing Month Pak 1 Mg Tabs (Varenicline tartrate) ..... Use as directed  Problem # 5:  CHEST PAIN (ICD-54.4) 60 year old white male with uncontrolled COPD and peripheral vascular disease complains of exertional chest pain.  Although his symptoms may be  due to COPD,  rule out cardiac  ischemia.  Refer to Dr. Jens Som for further cardiac testing.  Continue aggressive risk factor management. Orders: Cardiology Referral (Cardiology)  Complete Medication List: 1)  Dulera 200-5 Mcg/act Aero (Mometasone furo-formoterol fum) .... 2 puffs twice per day 2)  Simvastatin 40 Mg Tabs (Simvastatin) .... One by mouth once daily 3)  Benicar 20 Mg Tabs (Olmesartan medoxomil) .... Once by mouth once daily 4)  Sm Aspirin 325 Mg Tabs (Aspirin) .... One by mouth once daily 5)  Amitriptyline Hcl 25 Mg Tabs (Amitriptyline hcl) .... One by mouth bedtime 6)  Proair Hfa 108 (90 Base) Mcg/act Aers (Albuterol sulfate) .... One to two puff every 4 hours as needed 7)  Hydrocodone-acetaminophen 5-500 Mg Tabs (Hydrocodone-acetaminophen) .... One by mouth two times a day prn 8)  Chantix Starting Month Pak 0.5 Mg X 11 & 1 Mg X 42 Tabs (Varenicline tartrate) .... Use as directed 9)  Chantix Continuing Month Pak 1 Mg Tabs (Varenicline tartrate) .... Use as directed  Patient Instructions: 1)  Please schedule a follow-up appointment in 3 months. Prescriptions: HYDROCODONE-ACETAMINOPHEN 5-500 MG TABS (HYDROCODONE-ACETAMINOPHEN) one by mouth two times a day prn  #90 x 1   Entered and Authorized by:   D. Thomos Lemons DO   Signed by:   D. Thomos Lemons DO on 02/17/2010   Method used:   Print then Give to Patient   RxID:   [id number] AMITRIPTYLINE HCL 25 MG  TABS (AMITRIPTYLINE HCL) one by mouth bedtime  #90 x 0   Entered and Authorized by:   D. Thomos Lemons DO   Signed by:   D. Thomos Lemons DO on 02/17/2010   Method used:   Print then Give to Patient   RxID:    [id number] BENICAR 20 MG TABS (OLMESARTAN MEDOXOMIL) once by mouth once daily  #90 x 0   Entered and Authorized by:   D. Thomos Lemons DO   Signed by:   D. Thomos Lemons DO on 02/17/2010   Method used:   Print then Give to Patient   RxID:   [id number] SIMVASTATIN 40 MG  TABS (SIMVASTATIN) one by mouth once daily  #90 x 0   Entered and Authorized by:   D. Thomos Lemons DO   Signed by:   D. Thomos Lemons DO on 02/17/2010   Method used:   Print then Give to Patient   RxID:   [id number] CHANTIX CONTINUING MONTH PAK 1 MG TABS (VARENICLINE TARTRATE) use as directed  #1 x 3  Entered and Authorized by:   D. Thomos Lemons DO   Signed by:   D. Thomos Lemons DO on 02/17/2010   Method used:   Print then Give to Patient   RxID:   [id number] CHANTIX STARTING MONTH PAK 0.5 MG X 11 & 1 MG X 42 TABS (VARENICLINE TARTRATE) use as directed  #1 x 0   Entered and Authorized by:   D. Thomos Lemons DO   Signed by:   D. Thomos Lemons DO on 02/17/2010   Method used:   Print then Give to Patient   RxID:   [id number]   Current Allergies (reviewed today): ! PENICILLIN ! CEPHALEXIN ! AVELOX (MOXIFLOXACIN HCL)

## 2010-11-01 NOTE — Progress Notes (Signed)
Summary: Patient consented to participate in SUMMIT Study       Additional Follow-up for Phone Call Additional follow up Details #2::    noted  Follow-up by: Storm Frisk MD,  August 23, 2010 2:24 PM  Patient consented to participate in SUMMIT Trial 08/22/2010.

## 2010-11-01 NOTE — Assessment & Plan Note (Signed)
Summary: COLD/DLO   Vital Signs:  Patient profile:   60 year old male Weight:      168 pounds Temp:     97.7 degrees F oral Pulse rate:   100 / minute Pulse rhythm:   regular BP sitting:   110 / 70  (left arm) Cuff size:   regular  Vitals Entered By: Lowella Petties CMA (July 16, 2010 10:27 AM) CC: Cough with green mucous, tightness in chest   Primary Care Kevina Piloto:  Dondra Spry DO   History of Present Illness:   COPD  60 year old gentleman with chronic obstructive pulmonary disease who presents with some wheezing and tightness in his chest. He does have a young child his wife takes care of that has recently been sick, and they both developed an acute illness. He has some Prelone sputum production. This is discolored and green. He is also felt intermittently cold and hot and possibly fevers.  Afebrile today.  He is actively wheezing. He was sick about one month ago requiring prednisone and oral abx  Allergies: 1)  ! Penicillin 2)  ! Cephalexin 3)  ! Avelox (Moxifloxacin Hcl)  Past History:  Past medical, surgical, family and social histories (including risk factors) reviewed, and no changes noted (except as noted below).  Past Medical History: Reviewed history from 06/16/2010 and no changes required. Hypertension Hyperlipidemia AAA 5.0x4.8  BACK PAIN, LUMBAR  MIGRAINE HEADACHE COPD Golds Stage II     -Fev1 73%  FeV1FVC 49%  Rv 135%  DLCO 68% 03/2007 History of severe hypokalemia and hypomagnesemia secondary to alcohol abuse January of 2006.  Right Visual field cut with supranasal quadrantopia secondary to retinal artery occlusion History of alcoholic hepatitis in January of 2006. Status post trauma to the left eye 15 years ago. Left Achilles tendon rupture PAD with intermittent claudication, bilateral SFA occlusion Left subclavian stenosis by ultrasound  Past Surgical History: Reviewed history from 06/16/2010 and no changes required. Tonsillectomy    adenoidectomy   Family History: Reviewed history from 06/16/2010 and no changes required. Father died in his mid 50s secondary to myocardial infarction Mother died young age secondary to throat cancer      Social History: Reviewed history from 06/16/2010 and no changes required. Occupation: Engineer, structural Married with two grown daughters  current smoker - using e cigarettes Alcohol use-no; former abuse quit 2006  Review of Systems       REVIEW OF SYSTEMS GEN: Acute illness details above. CV: No chest pain or SOB GI: No noted N or V Otherwise, pertinent positives and negatives are noted in the HPI.    Physical Exam  General:  Well-developed,well-nourished,in no acute distress; alert,appropriate and cooperative throughout examination Ears:  External ear exam shows no significant lesions or deformities.  Otoscopic examination reveals clear canals, tympanic membranes are intact bilaterally without bulging, retraction, inflammation or discharge. Hearing is grossly normal bilaterally. Nose:  no external deformity.   Mouth:  pharynx pink and moist.   Neck:  No deformities, masses, or tenderness noted. Lungs:  decreased BS bilateral and prolonged exhalation.  bilateral exp wheeze no intercostal retractions, no accessory muscle use, and no crackles.   Heart:  SEM 2 /6 RSB, normal rate, regular rhythm, and no gallop.   Extremities:  No clubbing, cyanosis, edema, or deformity noted with normal full range of motion of all joints.   Neurologic:  alert & oriented X3 and gait normal.   Cervical Nodes:  No lymphadenopathy noted Psych:  Cognition and judgment  appear intact. Alert and cooperative with normal attention span and concentration. No apparent delusions, illusions, hallucinations   Impression & Recommendations:  Problem # 1:  CHRONIC OBSTRUCTIVE PULMONARY DISEASE, ACUTE EXACERBATION (ICD-491.21) Assessment New Acute COPD exac oral steroids ABX beta agonists  Problem # 2:   BRONCHITIS- ACUTE (ICD-466.0) Assessment: New  His updated medication list for this problem includes:    Dulera 200-5 Mcg/act Aero (Mometasone furo-formoterol fum) .Marland Kitchen... 2 puffs twice per day    Proair Hfa 108 (90 Base) Mcg/act Aers (Albuterol sulfate) ..... One to two puff every 4 hours as needed    Hydromet 5-1.5 Mg/8ml Syrp (Hydrocodone-homatropine) .Marland Kitchen... 1-2 tsp every 6 hr as needed cough    Albuterol Sulfate (2.5 Mg/64ml) 0.083% Nebu (Albuterol sulfate) .Marland Kitchen... 1 vial in nebulizer every 4-6 hours as needed for wheezing    Doxycycline Hyclate 100 Mg Caps (Doxycycline hyclate) .Marland Kitchen... 1 by mouth two times a day  Complete Medication List: 1)  Dulera 200-5 Mcg/act Aero (Mometasone furo-formoterol fum) .... 2 puffs twice per day 2)  Simvastatin 40 Mg Tabs (Simvastatin) .... One by mouth once daily 3)  Sm Aspirin 325 Mg Tabs (Aspirin) .... One by mouth once daily 4)  Amitriptyline Hcl 25 Mg Tabs (Amitriptyline hcl) .... One by mouth bedtime 5)  Proair Hfa 108 (90 Base) Mcg/act Aers (Albuterol sulfate) .... One to two puff every 4 hours as needed 6)  Hydrocodone-acetaminophen 7.5-500 Mg Tabs (Hydrocodone-acetaminophen) .... One by mouth two times a day as needed 7)  Hydromet 5-1.5 Mg/22ml Syrp (Hydrocodone-homatropine) .Marland Kitchen.. 1-2 tsp every 6 hr as needed cough 8)  Albuterol Sulfate (2.5 Mg/53ml) 0.083% Nebu (Albuterol sulfate) .Marland Kitchen.. 1 vial in nebulizer every 4-6 hours as needed for wheezing 9)  Multivitamins Tabs (Multiple vitamin) .... Take 1 tablet by mouth once a day 10)  B Complex Tabs (B complex vitamins) .... Take 1 tablet by mouth once a day 11)  Pletal 100 Mg Tabs (Cilostazol) .... Take one tablet by mouth two times a day 12)  Azor 5-40 Mg Tabs (Amlodipine-olmesartan) .... One by mouth once daily 13)  Doxycycline Hyclate 100 Mg Caps (Doxycycline hyclate) .Marland Kitchen.. 1 by mouth two times a day 14)  Prednisone 20 Mg Tabs (Prednisone) .... 2 tabs by mouth x 4 days, then 1 tab by mouth x 4  days Prescriptions: DOXYCYCLINE HYCLATE 100 MG CAPS (DOXYCYCLINE HYCLATE) 1 by mouth two times a day  #20 x 0   Entered and Authorized by:   Hannah Beat MD   Signed by:   Hannah Beat MD on 07/16/2010   Method used:   Print then Give to Patient   RxID:   [id number] PREDNISONE 20 MG TABS (PREDNISONE) 2 tabs by mouth x 4 days, then 1 tab by mouth x 4 days  #12 x 0   Entered and Authorized by:   Hannah Beat MD   Signed by:   Hannah Beat MD on 07/16/2010   Method used:   Print then Give to Patient   RxID:   [id number] DOXYCYCLINE HYCLATE 100 MG CAPS (DOXYCYCLINE HYCLATE) 1 by mouth two times a day  #14 x 0   Entered and Authorized by:   Hannah Beat MD   Signed by:   Hannah Beat MD on 07/16/2010   Method used:   Print then Give to Patient   RxID:   [id number]    Orders Added: 1)  Est. Patient Level IV [01027]

## 2010-11-01 NOTE — Assessment & Plan Note (Signed)
Summary: Acute NP office visit - COPD   Primary Provider/Referring Provider:  Dondra Spry DO  CC:  prod cough with white/gray mucus, wheezing, increased SOB, and feverish x5days - has been taking 50mg  prednisone x3days.  History of Present Illness: 60 year old with known history of with known history of COPD , current smoker .  Golds Stage II  FeV1 73% DLCO 68%   September 03, 2008 11:31 AM  Since last OV 11/09  still coughing and wheezing. Got better wiht ABX and pred but as meds were finished pt worsened.  Still smoking 2PPD.  Mucous now dark green.  Pt is dyspneic in the PM.  No chest pain.  No postnasal drip.   Pt denies any significant sore throat, nasal congestion , fever, chills, sweats, unintended weight loss, pleurtic or exertional chest pain, orthopnea PND, or leg swelling  October 08, 2008 12:03 PM Markedly improved dyspnea.  Less cough,  no chest pain.   Pt is still smoking.  Dyspnea is less.  On symbicort.  Proair is as needed .  July 29, 2009--Presents for an acute office visit. Complains of prod cough with green mucus, increased SOB, wheezing x1week. Otc not helping. Denies chest pain, dyspnea, orthopnea, hemoptysis, fever, n/v/d, edema, headache. Doing well until this last week.   August 12, 2009 11:49 AM Pt not any better from 10/28 NP ov. Mucous is still green.  Pt still dyspneic with wheezing.  No chest pain.  No other issues. The pt is still smoking and failed chantix  September 02, 2009 --Pt improved on prednisone and abx but worsened off.  Pt is still smoking.  Pt is stressed at work. Pt with still cough productive of thick green.  No fever. Pt is still dyspneic. Pt is smoking 1/2 PPD  September 23, 2009--Returns with persistent symptoms of increased SOB, tightness in chest, wheezing, prod cough with clear-to-gray mucus x1week . He continues to smoke 1 ppd. Felt better on higher dose of prednisone. currently on 10mg  once daily .CXR last visit was with no acute  changes. Denies chest pain,  orthopnea, hemoptysis, fever, n/v/d, edema, headache.  October 07, 2009 11:48 AM Cough is better vs before,   grey  mucous in the am and then clears out. Still dyspneic with long walks  Still able to work. Quit on 10/02/09 The pt saw NP on 12/23 and to stay on pred 20mg /d  There was rx with pulse pred and dexilant for 10days  The thrush rx was given diflucan and has finished   Feb 17, 2010 10:54 AM Was able to quit.  used the nicotrol tried the chantix,  got irritable.   achilles tendon rupture rx wiht cast and healed. healed secondarily Now has cough and is prod of clear mucous.  Back to smoking. dyspnea never improved that much and now sl worse. if get out of truck will worsen  03/15/10--Presents for an acute office visit. Complains of prod cough with white/gray mucus, wheezing, increased SOB, feverish x5days - has been taking 50mg  prednisone x3days. Had left over steroids at home. Mucinex not helping. Denies chest pain, orthopnea, hemoptysis, fever, n/v/d, edema, headache,.   Medications Prior to Update: 1)  Dulera 200-5 Mcg/act Aero (Mometasone Furo-Formoterol Fum) .... 2 Puffs Twice Per Day 2)  Simvastatin 40 Mg  Tabs (Simvastatin) .... One By Mouth Once Daily 3)  Benicar 40 Mg Tabs (Olmesartan Medoxomil) .... Take One Tablet By Mouth Daily 4)  Sm Aspirin 325  Mg  Tabs (Aspirin) .... One By Mouth Once Daily 5)  Amitriptyline Hcl 25 Mg  Tabs (Amitriptyline Hcl) .... One By Mouth Bedtime 6)  Proair Hfa 108 (90 Base) Mcg/act  Aers (Albuterol Sulfate) .... One To Two Puff Every 4 Hours As Needed 7)  Hydrocodone-Acetaminophen 5-500 Mg Tabs (Hydrocodone-Acetaminophen) .... One By Mouth Two Times A Day Prn 8)  Chantix Starting Month Pak 0.5 Mg X 11 & 1 Mg X 42 Tabs (Varenicline Tartrate) .... Use As Directed 9)  Chantix Continuing Month Pak 1 Mg Tabs (Varenicline Tartrate) .... Use As Directed  Current Medications (verified): 1)  Dulera 200-5 Mcg/act Aero  (Mometasone Furo-Formoterol Fum) .... 2 Puffs Twice Per Day 2)  Simvastatin 40 Mg  Tabs (Simvastatin) .... One By Mouth Once Daily 3)  Benicar 40 Mg Tabs (Olmesartan Medoxomil) .... Take One Tablet By Mouth Daily 4)  Sm Aspirin 325 Mg  Tabs (Aspirin) .... One By Mouth Once Daily 5)  Amitriptyline Hcl 25 Mg  Tabs (Amitriptyline Hcl) .... One By Mouth Bedtime 6)  Proair Hfa 108 (90 Base) Mcg/act  Aers (Albuterol Sulfate) .... One To Two Puff Every 4 Hours As Needed 7)  Hydrocodone-Acetaminophen 5-500 Mg Tabs (Hydrocodone-Acetaminophen) .... One By Mouth Two Times A Day Prn 8)  Chantix Starting Month Pak 0.5 Mg X 11 & 1 Mg X 42 Tabs (Varenicline Tartrate) .... Use As Directed 9)  Chantix Continuing Month Pak 1 Mg Tabs (Varenicline Tartrate) .... Use As Directed  Allergies (verified): 1)  ! Penicillin 2)  ! Cephalexin 3)  ! Avelox (Moxifloxacin Hcl)  Past History:  Past Medical History: Last updated: 03-Mar-2010 Hypertension Hyperlipidemia AAA 4.2 x 3.8 BACK PAIN, LUMBAR  MIGRAINE HEADACHE COPD Golds Stage II     -Fev1 73%  FeV1FVC 49%  Rv 135%  DLCO 68% 03/2007 History of severe hypokalemia and hypomagnesemia secondary to alcohol abuse January of 2006.  Right Visual field cut with supranasal quadrantopia secondary to retinal artery occlusion History of alcoholic hepatitis in January of 2006. Status post trauma to the left eye 15 years ago. Left Achilles tendon rupture  Past Surgical History: Last updated: 03-03-10 Tonsillectomy  adenoidectomy  Family History: Last updated: 2010/03/03 Father died in his mid 74s secondary to myocardial infarction Mother died young age secondary to throat cancer     Social History: Last updated: 03/15/2010 Occupation: Engineer, structural Married with two grown daughters  current smoker (3/4 ppd) Alcohol use-no; former abuse I   Risk Factors: Alcohol Use: 0 (02/04/2009) Caffeine Use: 4 beverages daily (02/04/2009) Exercise: no  (02/04/2009)  Risk Factors: Smoking Status: current (02/17/2010) Packs/Day: 0.5 (02/17/2010)  Social History: Occupation: Engineer, structural Married with two grown daughters  current smoker (3/4 ppd) Alcohol use-no; former abuse I   Review of Systems      See HPI  Vital Signs:  Patient profile:   60 year old male Height:      68 inches Weight:      174.31 pounds BMI:     26.60 O2 Sat:      98 % on Room air Temp:     97.9 degrees F oral Pulse rate:   95 / minute BP sitting:   148 / 86  (left arm) Cuff size:   regular  Vitals Entered By: Boone Master CNA/MA (March 15, 2010 11:55 AM)  O2 Flow:  Room air CC: prod cough with white/gray mucus, wheezing, increased SOB, feverish x5days - has been taking 50mg  prednisone x3days Is Patient Diabetic? No  Comments Medications reviewed with patient Daytime contact number verified with patient. Boone Master CNA/MA  March 15, 2010 11:56 AM    Physical Exam  Additional Exam:  Gen: WD WN      WM    in NAD    NCAT Heent:  no jvd, no TMG, no cervical LNademopathy, orophyx clear,  nares with clear watery drainage. Cor: RRR nl s1/s2  no s3/s4  no m r h g Abd: soft NT BSA   no masses  No HSM  no rebound or guarding Ext perfused with no c v e v.d Neuro: intact, moves all 4s, CN II-XII intact, DTRs intact Chest: coarse BS with few exp wheezing  Skin: clear  Genital/Rectal :deferred    Impression & Recommendations:  Problem # 1:  COPD (ICD-496)  Doxycycline 100mg  two times a day for 7 days  Mucinex DM two times a day as needed cough/congestion Taper prednisone 10mg  4 tabs for 3 days, then 3 tabs for 3 days, 2 tabs for 3 days, then 1 tab for 3 days, then stop  Hydromet 1-2 tsp every 6 hr as needed cough, may make you sleepy.  Please contact office for sooner follow up if symptoms do not improve or worsen  follow up Dr. Delford Field as scheduled and as needed   Orders: Est. Patient Level IV (16109)  Medications Added to Medication  List This Visit: 1)  Hydromet 5-1.5 Mg/57ml Syrp (Hydrocodone-homatropine) .Marland Kitchen.. 1-2 tsp every 6 hr as needed cough 2)  Doxycycline Hyclate 100 Mg Caps (Doxycycline hyclate) .Marland Kitchen.. 1 by mouth two times a day  Complete Medication List: 1)  Dulera 200-5 Mcg/act Aero (Mometasone furo-formoterol fum) .... 2 puffs twice per day 2)  Simvastatin 40 Mg Tabs (Simvastatin) .... One by mouth once daily 3)  Benicar 40 Mg Tabs (Olmesartan medoxomil) .... Take one tablet by mouth daily 4)  Sm Aspirin 325 Mg Tabs (Aspirin) .... One by mouth once daily 5)  Amitriptyline Hcl 25 Mg Tabs (Amitriptyline hcl) .... One by mouth bedtime 6)  Proair Hfa 108 (90 Base) Mcg/act Aers (Albuterol sulfate) .... One to two puff every 4 hours as needed 7)  Hydrocodone-acetaminophen 5-500 Mg Tabs (Hydrocodone-acetaminophen) .... One by mouth two times a day prn 8)  Chantix Starting Month Pak 0.5 Mg X 11 & 1 Mg X 42 Tabs (Varenicline tartrate) .... Use as directed 9)  Chantix Continuing Month Pak 1 Mg Tabs (Varenicline tartrate) .... Use as directed 10)  Hydromet 5-1.5 Mg/42ml Syrp (Hydrocodone-homatropine) .Marland Kitchen.. 1-2 tsp every 6 hr as needed cough 11)  Doxycycline Hyclate 100 Mg Caps (Doxycycline hyclate) .Marland Kitchen.. 1 by mouth two times a day  Patient Instructions: 1)  Doxycycline 100mg  two times a day for 7 days  2)  Mucinex DM two times a day as needed cough/congestion 3)  Taper prednisone 10mg  4 tabs for 3 days, then 3 tabs for 3 days, 2 tabs for 3 days, then 1 tab for 3 days, then stop  4)  Hydromet 1-2 tsp every 6 hr as needed cough, may make you sleepy.  5)  Please contact office for sooner follow up if symptoms do not improve or worsen  6)  follow up Dr. Delford Field as scheduled and as needed  Prescriptions: DOXYCYCLINE HYCLATE 100 MG CAPS (DOXYCYCLINE HYCLATE) 1 by mouth two times a day  #14 x 0   Entered and Authorized by:   Rubye Oaks NP   Signed by:   Tammy Parrett NP on 03/15/2010   Method  used:   Electronically to         [facility] (retail)       [address]       [place], [place]  [address]       Ph: [phone] or [phone]       Fax: [phone]   RxID:   [id number] HYDROMET 5-1.5 MG/5ML SYRP (HYDROCODONE-HOMATROPINE) 1-2 tsp every 6 hr as needed cough  #8 oz x 0    Entered and Authorized by:   Rubye Oaks NP   Signed by:   Tammy Parrett NP on 03/15/2010   Method used:   Print then Give to Patient   RxID:   [id number]

## 2010-11-01 NOTE — Progress Notes (Signed)
Summary: Prednsione rx sent in error  Phone Note Outgoing Call   Call placed by: Gweneth Dimitri RN,  June 13, 2010 4:07 PM Call placed to: Patient Summary of Call: Received electronic rx for pred taper.  Pt does not have prednsione listed on current med list and at last OV with TP on 6.14.11 he was given pred taper with instructions to stop after completing taper.  ? why pt needs this?  Called, spoke with his wife, Charolette.  She will have pt return call.  Will await return call. Initial call taken by: Gweneth Dimitri RN,  June 13, 2010 4:11 PM  Follow-up for Phone Call        Pt's wife, Walters, returned call.  States she called pt and he knew nothing about this so she called Pharmacy.  They told her the "computer sent it in error."  Will deny the rx for this reason.  Follow-up by: Gweneth Dimitri RN,  June 13, 2010 4:31 PM

## 2010-11-01 NOTE — Progress Notes (Signed)
Summary: albuterol refill  Phone Note Call from Patient Call back at Home Phone [phone]   Caller: Spouse Call For: wright Summary of Call: waiting on refill of albuterol. [facility] [address] caller: spouse charlie Initial call taken by: Tivis Ringer, CNA,  March 18, 2010 3:33 PM  Follow-up for Phone Call        Tammy, pt last saw you on 6/14- noqw requests refills on albuterol nebs but this med is not on current list.  Please advise, PW out of the office until 6/21, thanks! Follow-up by: Vernie Murders,  March 18, 2010 3:38 PM  Additional Follow-up for Phone Call Additional follow up Details #1::        ok can have #60, w/ 5 refills  albuterol 2.5mg  HHN via neb every 4-6 hrs as needed wheezing  Additional Follow-up by: Rubye Oaks NP,  March 18, 2010 4:29 PM    Additional Follow-up for Phone Call Additional follow up Details #2::    Rx was refilled.  LMOM for pt's spouse to be made aware this was done. Follow-up by: Vernie Murders,  March 18, 2010 4:53 PM  New/Updated Medications: ALBUTEROL SULFATE (2.5 MG/3ML) 0.083% NEBU (ALBUTEROL SULFATE) 1 vial in nebulizer every 4-6 hours as needed for wheezing Prescriptions: ALBUTEROL SULFATE (2.5 MG/3ML) 0.083% NEBU (ALBUTEROL SULFATE) 1 vial in nebulizer every 4-6 hours as needed for wheezing  #60 x 5   Entered by:   Vernie Murders   Authorized by:   Rubye Oaks NP   Signed by:   Vernie Murders on 03/18/2010   Method used:   Electronically to        [facility] [address] (retail)       [phone]       [place], [place]  [address]       Ph: [phone]       Fax: [phone]   RxID:   [id number]

## 2010-11-03 NOTE — Miscellaneous (Signed)
Summary: Orders Update pft charges  Clinical Lists Changes  Orders: Added new Service order of No Charge Patient Arrived (NCPA0) (NCPA0) - Signed 

## 2010-11-09 NOTE — Assessment & Plan Note (Addendum)
Summary: Pulmonary OV   Copy to:  Dr Jens Som Primary Provider/Referring Provider:  Dondra Spry DO  CC:  Acute Visit.  sneezing, chest tightness, chest congestion, prod cough with gray/green mucus, and increased SOB x 5 days.  Marland Kitchen  History of Present Illness: 60 year old with known history of with known history of COPD , current smoker .  Golds Stage II  FeV1 73% DLCO 68%   September 03, 2008 11:31 AM  Since last OV 11/09  still coughing and wheezing. Got better wiht ABX and pred but as meds were finished pt worsened.  Still smoking 2PPD.  Mucous now dark green.  Pt is dyspneic in the PM.  No chest pain.  No postnasal drip.   Pt denies any significant sore throat, nasal congestion , fever, chills, sweats, unintended weight loss, pleurtic or exertional chest pain, orthopnea PND, or leg swelling  October 08, 2008 12:03 PM Markedly improved dyspnea.  Less cough,  no chest pain.   Pt is still smoking.  Dyspnea is less.  On symbicort.  Proair is as needed .  July 29, 2009--Presents for an acute office visit. Complains of prod cough with green mucus, increased SOB, wheezing x1week. Otc not helping. Denies chest pain, dyspnea, orthopnea, hemoptysis, fever, n/v/d, edema, headache. Doing well until this last week.   August 12, 2009 11:49 AM Pt not any better from 10/28 NP ov. Mucous is still green.  Pt still dyspneic with wheezing.  No chest pain.  No other issues. The pt is still smoking and failed chantix  September 02, 2009 --Pt improved on prednisone and abx but worsened off.  Pt is still smoking.  Pt is stressed at work. Pt with still cough productive of thick green.  No fever. Pt is still dyspneic. Pt is smoking 1/2 PPD  September 23, 2009--Returns with persistent symptoms of increased SOB, tightness in chest, wheezing, prod cough with clear-to-gray mucus x1week . He continues to smoke 1 ppd. Felt better on higher dose of prednisone. currently on 10mg  once daily .CXR last visit was with  no acute changes. Denies chest pain,  orthopnea, hemoptysis, fever, n/v/d, edema, headache.  October 07, 2009 11:48 AM Cough is better vs before,   grey  mucous in the am and then clears out. Still dyspneic with long walks  Still able to work. Quit on 10/02/09 The pt saw NP on 12/23 and to stay on pred 20mg /d  There was rx with pulse pred and dexilant for 10days  The thrush rx was given diflucan and has finished   Feb 17, 2010 10:54 AM Was able to quit.  used the nicotrol tried the chantix,  got irritable.   achilles tendon rupture rx wiht cast and healed. healed secondarily Now has cough and is prod of clear mucous.  Back to smoking. dyspnea never improved that much and now sl worse. if get out of truck will worsen  03/15/10--Presents for an acute office visit. Complains of prod cough with white/gray mucus, wheezing, increased SOB, feverish x5days - has been taking 50mg  prednisone x3days. Had left over steroids at home. Mucinex not helping.    June 28, 2010 --Presents for an acute office visit. Complains of  chest congestion, increased SOB, productive cough with dark gray to green, post nasal drip for last 5 days. Taken advil as needed . Denies chest pain,  orthopnea, hemoptysis, fever, n/v/d, edema, headache.  has been doing ok until last week. Wife had similar symptoms  1 week ago.   August 11, 2010 12:05 PM Ok until developed URI few days ago.  Now:  coughing more mucus that is green Tight in chest and wheeze.  No f/c/s.  More dyspnea. More PN drip. When got pred and doxy at last acute ov this helped   November 01, 2010 2:43 PM Glenford Peers and cold and tight chest .  More mucus is produced first thing in am is green and then gray in color , pt in research protocol pt occ is smoking   Preventive Screening-Counseling & Management  Alcohol-Tobacco     Alcohol drinks/day: 0     Alcohol Counseling: not indicated; patient does not drink     Smoking Status: current     Smoking Cessation  Counseling: yes     Smoke Cessation Stage: contemplative     Packs/Day: 0.5     Year Started: 1963     Year Quit: 03/2010      Tobacco Counseling: to quit use of tobacco products  Current Medications (verified): 1)  Simvastatin 40 Mg  Tabs (Simvastatin) .... One By Mouth Once Daily 2)  Sm Aspirin 325 Mg  Tabs (Aspirin) .... One By Mouth Once Daily 3)  Amitriptyline Hcl 25 Mg  Tabs (Amitriptyline Hcl) .... One By Mouth Bedtime 4)  Proair Hfa 108 (90 Base) Mcg/act  Aers (Albuterol Sulfate) .... One To Two Puff Every 4 Hours As Needed 5)  Hydrocodone-Acetaminophen 7.5-500 Mg Tabs (Hydrocodone-Acetaminophen) .... One By Mouth Two Times A Day As Needed 6)  Hydromet 5-1.5 Mg/71ml Syrp (Hydrocodone-Homatropine) .Marland Kitchen.. 1-2 Tsp Every 6 Hr As Needed Cough 7)  Albuterol Sulfate (2.5 Mg/29ml) 0.083% Nebu (Albuterol Sulfate) .Marland Kitchen.. 1 Vial in Nebulizer Every 4-6 Hours As Needed For Wheezing 8)  Multivitamins  Tabs (Multiple Vitamin) .... Take 1 Tablet By Mouth Once A Day 9)  B Complex  Tabs (B Complex Vitamins) .... Take 1 Tablet By Mouth Once A Day 10)  Pletal 100 Mg Tabs (Cilostazol) .... Take One Tablet By Mouth Two Times A Day 11)  Azor 5-40 Mg Tabs (Amlodipine-Olmesartan) .... One By Mouth Once Daily  Allergies (verified): 1)  ! Penicillin 2)  ! Cephalexin 3)  ! Avelox (Moxifloxacin Hcl)  Review of Systems       The patient complains of shortness of breath with activity, productive cough, and acid heartburn.  The patient denies shortness of breath at rest, non-productive cough, coughing up blood, chest pain, irregular heartbeats, indigestion, loss of appetite, weight change, abdominal pain, difficulty swallowing, sore throat, tooth/dental problems, headaches, nasal congestion/difficulty breathing through nose, sneezing, itching, ear ache, anxiety, depression, hand/feet swelling, joint stiffness or pain, rash, change in color of mucus, and fever.    Vital Signs:  Patient profile:   60 year old  male Height:      67.5 inches Weight:      162.50 pounds BMI:     25.17 O2 Sat:      94 % on Room air Temp:     98.2 degrees F oral Pulse rate:   81 / minute BP sitting:   104 / 68  (left arm) Cuff size:   regular  Vitals Entered By: Gweneth Dimitri RN (November 01, 2010 2:21 PM)  O2 Flow:  Room air CC: Acute Visit.  sneezing, chest tightness, chest congestion, prod cough with gray/green mucus, increased SOB x 5 days.   Comments Medications reviewed with patient Daytime contact number verified with patient. Gweneth Dimitri RN  November 01, 2010 2:22  PM     Physical Exam  Additional Exam:  Gen: WD WN      WM    in NAD    NCAT Heent:  no jvd, no TMG, no cervical LNademopathy, orophyx clear,  nares with clear watery drainage. Cor: RRR nl s1/s2  no s3/s4  no m r h g Abd: soft NT BSA   no masses  No HSM  no rebound or guarding Ext perfused with no c v e v.d Neuro: intact, moves all 4s, CN II-XII intact, DTRs intact Chest: coarse BS with few exp wheezing  Skin: clear  Genital/Rectal :deferred    Impression & Recommendations:  Problem # 1:  CHRONIC OBSTRUCTIVE PULMONARY DISEASE, ACUTE EXACERBATION (ICD-491.21) Assessment Deteriorated acute tracheobronchitis with flare plan Doxycycline one twice daily for 7days Prednisone 10mg  4 each am x3days, 3 x 3days, 2 x 3days, 1 x 3days then stop No other medication changes  Return for recheck in 2 weeks  Est. Patient Level IV (29562)  Medications Added to Medication List This Visit: 1)  Research Inhaler  .... One puff daily 2)  Prednisone 10 Mg Tabs (Prednisone) .... Take as directed 4 each am x3days, 3 x 3days, 2 x 3days, 1 x 3days then stop 3)  Doxycycline Monohydrate 100 Mg Caps (Doxycycline monohydrate) .... By mouth twice daily 4)  Hydromet 5-1.5 Mg/59ml Syrp (Hydrocodone-homatropine) .... 5ml by mouth every 4-6 hours as needed cough  Complete Medication List: 1)  Simvastatin 40 Mg Tabs (Simvastatin) .... One by mouth once daily 2)   Sm Aspirin 325 Mg Tabs (Aspirin) .... One by mouth once daily 3)  Amitriptyline Hcl 25 Mg Tabs (Amitriptyline hcl) .... One by mouth bedtime 4)  Proair Hfa 108 (90 Base) Mcg/act Aers (Albuterol sulfate) .... One to two puff every 4 hours as needed 5)  Hydrocodone-acetaminophen 7.5-500 Mg Tabs (Hydrocodone-acetaminophen) .... One by mouth two times a day as needed 6)  Hydromet 5-1.5 Mg/66ml Syrp (Hydrocodone-homatropine) .Marland Kitchen.. 1-2 tsp every 6 hr as needed cough 7)  Albuterol Sulfate (2.5 Mg/39ml) 0.083% Nebu (Albuterol sulfate) .Marland Kitchen.. 1 vial in nebulizer every 4-6 hours as needed for wheezing 8)  Multivitamins Tabs (Multiple vitamin) .... Take 1 tablet by mouth once a day 9)  B Complex Tabs (B complex vitamins) .... Take 1 tablet by mouth once a day 10)  Pletal 100 Mg Tabs (Cilostazol) .... Take one tablet by mouth two times a day 11)  Azor 5-40 Mg Tabs (Amlodipine-olmesartan) .... One by mouth once daily 12)  Research Inhaler  .... One puff daily 13)  Prednisone 10 Mg Tabs (Prednisone) .... Take as directed 4 each am x3days, 3 x 3days, 2 x 3days, 1 x 3days then stop 14)  Doxycycline Monohydrate 100 Mg Caps (Doxycycline monohydrate) .... By mouth twice daily 15)  Hydromet 5-1.5 Mg/53ml Syrp (Hydrocodone-homatropine) .... 5ml by mouth every 4-6 hours as needed cough  Patient Instructions: 1)  Doxycycline one twice daily for 7days 2)  Prednisone 10mg  4 each am x3days, 3 x 3days, 2 x 3days, 1 x 3days then stop 3)  No other medication changes  4)  Return for recheck in 2 weeks  Prescriptions: HYDROMET 5-1.5 MG/5ML SYRP (HYDROCODONE-HOMATROPINE) 5ml by mouth every 4-6 hours as needed cough  #100 ml x 0   Entered and Authorized by:   Storm Frisk MD   Signed by:   Storm Frisk MD on 11/01/2010   Method used:   Print then Give to Patient   RxID:  [id number] DOXYCYCLINE MONOHYDRATE 100 MG  CAPS (DOXYCYCLINE MONOHYDRATE) By mouth twice daily  #14 x 0   Entered and Authorized by:   Storm Frisk MD   Signed by:   Storm Frisk MD on 11/01/2010   Method used:   Electronically to        [id number] ([facility]       [address]       [place], [place]  [address]       Ph: [phone] or [phone]       Fax: [phone]   RxID:   [id number] PREDNISONE 10 MG  TABS (PREDNISONE) Take as directed 4 each am x3days, 3 x 3days, 2 x 3days, 1 x 3days then stop  #30 x 0   Entered and Authorized by:   Storm Frisk MD   Signed by:   Storm Frisk MD on 11/01/2010   Method used:   Electronically to        [id number] (retail)       [address]       [place], [place]  [address]       Ph: [phone] or [phone]       Fax: [phone]   RxID:   [id number]     Appended Document: Pulmonary OV fax [facility]

## 2010-11-10 ENCOUNTER — Telehealth: Payer: Self-pay | Admitting: Critical Care Medicine

## 2010-11-10 ENCOUNTER — Telehealth: Payer: Self-pay | Admitting: Internal Medicine

## 2010-11-11 ENCOUNTER — Telehealth (INDEPENDENT_AMBULATORY_CARE_PROVIDER_SITE_OTHER): Payer: Self-pay

## 2010-11-16 ENCOUNTER — Encounter: Payer: Self-pay | Admitting: Internal Medicine

## 2010-11-16 ENCOUNTER — Ambulatory Visit (INDEPENDENT_AMBULATORY_CARE_PROVIDER_SITE_OTHER): Payer: PRIVATE HEALTH INSURANCE | Admitting: Internal Medicine

## 2010-11-16 DIAGNOSIS — M545 Low back pain: Secondary | ICD-10-CM

## 2010-11-16 LAB — CONVERTED CEMR LAB
Albumin: 4.4 g/dL (ref 3.5–5.2)
CO2: 25 meq/L (ref 19–32)
Chloride: 105 meq/L (ref 96–112)
Cocaine Metabolites: NEGATIVE
Creatinine,U: 75.8 mg/dL
HDL: 48 mg/dL (ref >39)
LDL Cholesterol: 108 mg/dL — ABNORMAL HIGH (ref 0–99)
Phencyclidine (PCP): NEGATIVE
Sodium: 141 meq/L (ref 135–145)
TSH: 1.686 microintl units/mL (ref 0.350–4.500)
Total Bilirubin: 0.3 mg/dL (ref 0.3–1.2)
Total CHOL/HDL Ratio: 4.1

## 2010-11-17 ENCOUNTER — Ambulatory Visit (INDEPENDENT_AMBULATORY_CARE_PROVIDER_SITE_OTHER): Payer: PRIVATE HEALTH INSURANCE | Admitting: Critical Care Medicine

## 2010-11-17 ENCOUNTER — Encounter: Payer: Self-pay | Admitting: Internal Medicine

## 2010-11-17 ENCOUNTER — Encounter: Payer: Self-pay | Admitting: Critical Care Medicine

## 2010-11-17 DIAGNOSIS — F17200 Nicotine dependence, unspecified, uncomplicated: Secondary | ICD-10-CM

## 2010-11-17 DIAGNOSIS — J449 Chronic obstructive pulmonary disease, unspecified: Secondary | ICD-10-CM

## 2010-11-17 NOTE — Progress Notes (Signed)
Summary: Refill request for Hydrocodone syrup  Phone Note Refill Request Message from:  Fax from Pharmacy  Refills Requested: Medication #1:  HYDROMET 5-1.5 MG/5ML SYRP 1-2 tsp every 6 hr as needed cough   Last Refilled: 11/01/2010   Notes: #118mL Re[id number] [address] phone [phone], fax [phone]  Initial call taken by: Zackery Barefoot CMA,  November 10, 2010 12:24 PM  Follow-up for Phone Call        ok to refill ask pt if he is still smoking Follow-up by: Storm Frisk MD,  November 10, 2010 1:29 PM  Additional Follow-up for Phone Call Additional follow up Details #1::        Rx called to pharmacy. LMOMTCB X1 to see if he is still smoking or not  Pt called back and i formed him we sent rx to the pharmacy. Pt states he only smoke 3-4 cigs a day but he was hesitant when informing me. Carver Fila  November 10, 2010 3:40 PM     Additional Follow-up for Phone Call Additional follow up Details #2::    noted  Follow-up by: Storm Frisk MD,  November 11, 2010 4:56 PM  Prescriptions: HYDROMET 5-1.5 MG/5ML SYRP (HYDROCODONE-HOMATROPINE) 5ml by mouth every 4-6 hours as needed cough  #100 ml x 0   Entered by:   Carver Fila   Authorized by:   Storm Frisk MD   Signed by:   Carver Fila on 11/10/2010   Method used:   Historical   RxID:   [id number]

## 2010-11-17 NOTE — Progress Notes (Signed)
Summary: Hydrocodone Refill  Phone Note Refill Request Message from:  Fax from Pharmacy on November 10, 2010 11:54 AM  Refills Requested: Medication #1:  HYDROCODONE-ACETAMINOPHEN 7.5-500 MG TABS one by mouth two times a day as needed   Dosage confirmed as above?Dosage Confirmed   Brand Name Necessary? No   Supply Requested: 1 month   [facility]: [facility] [address] [employer] [id number] fax [phone].    Method Requested: Electronic Next Appointment Scheduled: 02.15.12 Kaytlynne Neace Initial call taken by: Elba Barman,  November 10, 2010 11:55 AM  Follow-up for Phone Call        call placed to patient at [phone], he states he requested the refill for back pain that he is having. Follow-up by: Glendell Docker CMA,  November 10, 2010 1:11 PM  Additional Follow-up for Phone Call Additional follow up Details #1::        refill x 1 Additional Follow-up by: D. Thomos Lemons DO,  November 10, 2010 1:25 PM    Prescriptions: HYDROCODONE-ACETAMINOPHEN 7.5-500 MG TABS (HYDROCODONE-ACETAMINOPHEN) one by mouth two times a day as needed  #60 x 0   Entered by:   Mervin Kung CMA (AAMA)   Authorized by:   D. Thomos Lemons DO   Signed by:   Mervin Kung CMA (AAMA) on 11/10/2010   Method used:   Telephoned to ...       [id number] ([facility]       [address]       [place], [place]  [address]       Ph: [phone] or [phone]       Fax: [phone]   RxID:   [id number]

## 2010-11-17 NOTE — Progress Notes (Signed)
Summary: APPT  ---- Converted from flag ---- ---- 09/21/2010 5:21 PM, Julieta Gutting, RN, BSN wrote:   ---- 09/21/2010 4:19 PM, Omer Jack wrote: I have tried to set him up for these appt's  but he said he doesn't know his schedule and he will call me when he does.  ---- 09/19/2010 11:18 AM, Julieta Gutting, RN, BSN wrote:   ---- 05/16/2010 11:58 AM, Julieta Gutting, RN, BSN wrote: make sure pt has OV with [facility] in January ------------------------------

## 2010-11-23 NOTE — Letter (Signed)
Summary: Controlled Substances Contract  [place] [facility]  [address]   [place], [place] [address]   Phone: [phone]  Fax: [phone]    [facility] Controlled Substances Contract         Patient Name: [name] Patient DOB: 02-Feb-1951        Patient MRN:  [id number]        Physician's Name: Dr. Thomos Lemons  Patients must complete this contract before doctors at the [facility] office will be willing to prescribe controlled substances. I understand that: __1)  I am responsible for my controlled substance medications.  If my prescription is lost, misplaced or stolen, or if I take more than prescribed, my doctor will not write me a new prescription. ___2)  I will not request or accept controlled substances or controlled substance prescriptions from any other doctor or clinic while I am receiving controlled substance treatment at [facility].  The ONLY exception is if controlled substances are prescribed for the treatment of an acute condition that is NOT the diagnosis for which I am receiving treatment at [facility].   I will call my physician at [facility] if I receive controlled substance or controlled substance prescriptions from anywhere else. ___3)  Controlled substance refills will be made ONLY during regular office hours. ___4)  Refills will not be made if I run out early.  Refills will not be made during work-in or urgent care visits.  Refills will NOT be made for "emergencies", such as on a Friday afternoon or by on call service at night or weekends.  I understand that I am required to call at least 2 business days prior to expiration date for controlled substance and/or needing controlled substance refills.   ___5)  I will not use illicit (illegal) drugs.  ___6)  I agree to take urine or blood drug tests when requested for routine screening. ___7)  I agree to use only ONE pharmacy for filling ALL my controlled  substance prescriptions.             Name and Location of Pharmacy: [facility]                                                                                                               ___8)  I understand that my doctor may review my use of controlled substances using the [facility] Controlled Substance Reporting System. ___9)  If I behave in an abusive way towards [facility] staff, my controlled substance prescriptions may be stopped, and I may be dismissed from this practice. ___10)  I understand that if I break any of the above terms of this contract, my pain prescription and/or treatment may be stopped immediately.  If I get controlled substances from someone else or use illegal drugs, I may be reported to all my doctors, medical facilities and appropriate authorities.  I have been fully informed by [facility] physicians and the staff regarding psychological  dependence (addiction) to controlled substances.  I understand that I should stop my medication ONLY under medical supervision or I may have withdrawal symptoms.  ***I have read this contract and it has been explained to me by [facility] physicians and/or their staff, and I fully understand the consequences of violating any of the terms of this contract.    Patient Signature [name] Date November 16, 2010   [facility] Staff Signature DARLENE [facility] Date November 16, 2010

## 2010-11-23 NOTE — Assessment & Plan Note (Signed)
Summary: Pulmonary OV   Copy to:  Dr Jens Som Primary Provider/Referring Provider:  D. Thomos Lemons DO  CC:  2 wk follow up.  Pt states he is doing a lot better.  SOB and cough have improved.  Cough is prod with white/light gray mucus.  Denies chest tightness.Marland Kitchen  History of Present Illness: 60 year old with known history of with known history of COPD , current smoker .  Golds Stage II  FeV1 73% DLCO 68%    November 17, 2010 9:16 AM Pt is improved and less cough.  Pt still smoking about 3-5 cigs per day and occ uses electronic cigarette.  No other new issues noted. Pt with more mucus and is produced first thing in the am.  Mucus is no longer discolored.   Pt in [place] trial getting research inhaler daily     Preventive Screening-Counseling & Management  Alcohol-Tobacco     Alcohol drinks/day: 0     Alcohol Counseling: not indicated; patient does not drink     Smoking Status: current     Smoking Cessation Counseling: yes     Smoke Cessation Stage: contemplative     Packs/Day: 0.25     Year Started: 1963     Year Quit: 03/2010      Tobacco Counseling: to quit use of tobacco products  Current Medications (verified): 1)  Simvastatin 40 Mg  Tabs (Simvastatin) .... One By Mouth Once Daily 2)  Sm Aspirin 325 Mg  Tabs (Aspirin) .... One By Mouth Once Daily 3)  Amitriptyline Hcl 25 Mg  Tabs (Amitriptyline Hcl) .... One By Mouth Bedtime 4)  Proair Hfa 108 (90 Base) Mcg/act  Aers (Albuterol Sulfate) .... One To Two Puff Every 4 Hours As Needed 5)  Albuterol Sulfate (2.5 Mg/80ml) 0.083% Nebu (Albuterol Sulfate) .Marland Kitchen.. 1 Vial in Nebulizer Every 4-6 Hours As Needed For Wheezing 6)  Multivitamins  Tabs (Multiple Vitamin) .... Take 1 Tablet By Mouth Once A Day 7)  B Complex  Tabs (B Complex Vitamins) .... Take 1 Tablet By Mouth Once A Day 8)  Pletal 100 Mg Tabs (Cilostazol) .... Take One Tablet By Mouth Two Times A Day 9)  Azor 5-40 Mg Tabs (Amlodipine-Olmesartan) .... One By Mouth Once Daily 10)   Research Inhaler .... One Puff Daily 11)  Hydromet 5-1.5 Mg/66ml Syrp (Hydrocodone-Homatropine) .... 5ml By Mouth Every 4-6 Hours As Needed Cough 12)  Morphine Sulfate Cr 15 Mg Xr12h-Tab (Morphine Sulfate) .... One By Mouth Two Times A Day  Allergies (verified): 1)  ! Penicillin 2)  ! Cephalexin 3)  ! Avelox (Moxifloxacin Hcl)  Past History:  Past medical, surgical, family and social histories (including risk factors) reviewed, and no changes noted (except as noted below).  Past Medical History: Reviewed history from 08/15/2010 and no changes required. Hypertension Hyperlipidemia AAA 5.0x4.8   BACK PAIN, LUMBAR  MIGRAINE HEADACHE COPD Golds Stage II     -Fev1 73%  FeV1FVC 49%  Rv 135%  DLCO 68% 03/2007 History of severe hypokalemia and hypomagnesemia secondary to alcohol abuse January of 2006.  Right Visual field cut with supranasal quadrantopia secondary to retinal artery occlusion History of alcoholic hepatitis in January of 2006. Status post trauma to the left eye 15 years ago. Left Achilles tendon rupture PAD with intermittent claudication, bilateral SFA occlusion Left subclavian stenosis by ultrasound  Past Surgical History: Reviewed history from 08/15/2010 and no changes required. Tonsillectomy  adenoidectomy    Family History: Reviewed history from 08/15/2010 and no changes  required. Father died in his mid 57s secondary to myocardial infarction Mother died young age secondary to throat cancer       Social History: Reviewed history from 08/15/2010 and no changes required. Occupation: retired, was let go at work 06/2010 Married with two grown daughters  current smoker - using e cigarettes Alcohol use-no; former abuse quit 2006  Packs/Day:  0.25  Review of Systems       The patient complains of shortness of breath with activity and productive cough.  The patient denies shortness of breath at rest, non-productive cough, coughing up blood, chest pain, irregular  heartbeats, acid heartburn, indigestion, loss of appetite, weight change, abdominal pain, difficulty swallowing, sore throat, tooth/dental problems, headaches, nasal congestion/difficulty breathing through nose, sneezing, itching, ear ache, anxiety, depression, hand/feet swelling, joint stiffness or pain, rash, change in color of mucus, and fever.    Vital Signs:  Patient profile:   60 year old male Height:      67.5 inches Weight:      165.31 pounds BMI:     25.60 O2 Sat:      96 % on Room air Temp:     98.0 degrees F oral Pulse rate:   93 / minute BP sitting:   110 / 60  (left arm) Cuff size:   regular  Vitals Entered By: Gweneth Dimitri RN (November 17, 2010 9:09 AM)  O2 Flow:  Room air CC: 2 wk follow up.  Pt states he is doing a lot better.  SOB and cough have improved.  Cough is prod with white/light gray mucus.  Denies chest tightness. Comments Medications reviewed with patient Daytime contact number verified with patient. Crystal Jones RN  November 17, 2010 9:12 AM    Physical Exam  Additional Exam:  Gen: WD WN      WM    in NAD    NCAT Heent:  no jvd, no TMG, no cervical LNademopathy, orophyx clear,  nares with clear watery drainage. Cor: RRR nl s1/s2  no s3/s4  no m r h g Abd: soft NT BSA   no masses  No HSM  no rebound or guarding Ext perfused with no c v e v.d Neuro: intact, moves all 4s, CN II-XII intact, DTRs intact Chest: coarse BS with decreased  wheezing  Skin: clear  Genital/Rectal :deferred    Impression & Recommendations:  Problem # 1:  CHRONIC OBSTRUCTIVE PULMONARY DISEASE, ACUTE EXACERBATION (ICD-491.21) Assessment Improved acute tracheobronchitis with flare now improved  plan No other changes ,  cont inhaled meds  Complete Medication List: 1)  Simvastatin 40 Mg Tabs (Simvastatin) .... One by mouth once daily 2)  Sm Aspirin 325 Mg Tabs (Aspirin) .... One by mouth once daily 3)  Amitriptyline Hcl 25 Mg Tabs (Amitriptyline hcl) .... One by mouth  bedtime 4)  Proair Hfa 108 (90 Base) Mcg/act Aers (Albuterol sulfate) .... One to two puff every 4 hours as needed 5)  Albuterol Sulfate (2.5 Mg/1ml) 0.083% Nebu (Albuterol sulfate) .Marland Kitchen.. 1 vial in nebulizer every 4-6 hours as needed for wheezing 6)  Multivitamins Tabs (Multiple vitamin) .... Take 1 tablet by mouth once a day 7)  B Complex Tabs (B complex vitamins) .... Take 1 tablet by mouth once a day 8)  Pletal 100 Mg Tabs (Cilostazol) .... Take one tablet by mouth two times a day 9)  Azor 5-40 Mg Tabs (Amlodipine-olmesartan) .... One by mouth once daily 10)  Research Inhaler  .... One puff daily 11)  Hydromet  5-1.5 Mg/55ml Syrp (Hydrocodone-homatropine) .... 5ml by mouth every 4-6 hours as needed cough 12)  Morphine Sulfate Cr 15 Mg Xr12h-tab (Morphine sulfate) .... One by mouth two times a day  Other Orders: Est. Patient Level III ([address]) Tobacco use cessation intensive >10 minutes ([address])  Patient Instructions: 1)  No change in medications 2)  Return in     4     months

## 2010-11-23 NOTE — Letter (Signed)
   [name] at [facility] [address] [place], [place]  [address]  [place] Phone: [phone]      November 17, 2010   [name] [address] [place], [place] [address]  RE:  LAB RESULTS  Dear  Mr. HOUSMAN,  The following is an interpretation of your most recent lab tests.  Please take note of any instructions provided or changes to medications that have resulted from your lab work.  ELECTROLYTES:  Good - no changes needed  KIDNEY FUNCTION TESTS:  Good - no changes needed  LIVER FUNCTION TESTS:  Good - no changes needed  LIPID PANEL:  Fair - review at your next visit Triglyceride: 211   Cholesterol: 198   LDL: 108   HDL: 48   Chol/HDL%:  4.1 Ratio  THYROID STUDIES:  Thyroid studies normal TSH: 1.686      Your LDL ( bad cholesterol ) should be less than 70 given your history of peripheral vascular disease.   Please make sure you are taking your simvastatin regularly.   See attached handout on low saturated fat diet.         Sincerely Yours,    Dr. Thomos Lemons  Appended Document:  mailed

## 2010-11-29 NOTE — Miscellaneous (Signed)
Summary: Controlled Substances Contract  Controlled Substances Contract   Imported By: Maryln Gottron 11/24/2010 11:08:29  _____________________________________________________________________  External Attachment:    Type:   Image     Comment:   External Document

## 2010-11-30 ENCOUNTER — Ambulatory Visit (INDEPENDENT_AMBULATORY_CARE_PROVIDER_SITE_OTHER): Payer: PRIVATE HEALTH INSURANCE | Admitting: Internal Medicine

## 2010-11-30 ENCOUNTER — Encounter: Payer: Self-pay | Admitting: Internal Medicine

## 2010-11-30 DIAGNOSIS — M545 Low back pain: Secondary | ICD-10-CM

## 2010-12-08 NOTE — Assessment & Plan Note (Signed)
Summary: mon fu hea   Vital Signs:  Patient profile:   60 year old male Height:      67.5 inches Weight:      161 pounds BMI:     24.93 O2 Sat:      98 % on Room air Temp:     98.3 degrees F oral Resp:     20 per minute BP sitting:   126 / 60  (right arm) Cuff size:   regular  Vitals Entered By: Glendell Docker CMA (November 16, 2010 10:59 AM)  O2 Flow:  Room air CC: Back Pain Is Patient Diabetic? No Pain Assessment Patient in pain? yes     Location: back Intensity: 8 Type: sharp Onset of pain  Gradual Comments c/o lower back pain worse with increase in activity ongoing for the past month and a half   Primary Care Provider:  DThomos Lemons DO  CC:  Back Pain.  History of Present Illness: 60 y/o white male co  right sided of lower back.  his symptoms are getting worse now radiating to right hip and right leg severity currently 8 out of 10  can be high as 9 out of 10 no aggravating factors previously seen by back specialist in the past      Preventive Screening-Counseling & Management  Alcohol-Tobacco     Smoking Status: current  Allergies: 1)  ! Penicillin 2)  ! Cephalexin 3)  ! Avelox (Moxifloxacin Hcl)  Past History:  Past Medical History: Hypertension Hyperlipidemia AAA 5.0x4.8   BACK PAIN, LUMBAR   MIGRAINE HEADACHE COPD Golds Stage II     -Fev1 73%  FeV1FVC 49%  Rv 135%  DLCO 68% 03/2007 History of severe hypokalemia and hypomagnesemia secondary to alcohol abuse January of 2006.  Right Visual field cut with supranasal quadrantopia secondary to retinal artery occlusion History of alcoholic hepatitis in January of 2006. Status post trauma to the left eye 15 years ago. Left Achilles tendon rupture PAD with intermittent claudication, bilateral SFA occlusion Left subclavian stenosis by ultrasound  Family History: Father died in his mid 42s secondary to myocardial infarction Mother died young age secondary to throat cancer         Physical  Exam  General:  alert, well-developed, and well-nourished.   Lungs:  normal respiratory effort.  prolonged expiration Heart:  normal rate, regular rhythm, and no gallop.   Extremities:  trace left pedal edema and trace right pedal edema.   Neurologic:  cranial nerves II-XII intact and gait normal.     Impression & Recommendations:  Problem # 1:  BACK PAIN, LUMBAR (ICD-724.2) pt with chronic low back pain. he declines referral to neurosurgeon and pain mgt specialist use longer acting pain med pain medication contract reviewed with pt  His updated medication list for this problem includes:    Sm Aspirin 325 Mg Tabs (Aspirin) ..... One by mouth once daily    Morphine Sulfate Cr 15 Mg Xr12h-tab (Morphine sulfate) ..... One by mouth two times a day  Orders: T- * Misc. Laboratory test [phone])  Complete Medication List: 1)  Simvastatin 40 Mg Tabs (Simvastatin) .... One by mouth once daily 2)  Sm Aspirin 325 Mg Tabs (Aspirin) .... One by mouth once daily 3)  Amitriptyline Hcl 25 Mg Tabs (Amitriptyline hcl) .... One by mouth bedtime 4)  Proair Hfa 108 (90 Base) Mcg/act Aers (Albuterol sulfate) .... One to two puff every 4 hours as needed 5)  Albuterol Sulfate (2.5 Mg/16ml) 0.083%  Nebu (Albuterol sulfate) .Marland Kitchen.. 1 vial in nebulizer every 4-6 hours as needed for wheezing 6)  Multivitamins Tabs (Multiple vitamin) .... Take 1 tablet by mouth once a day 7)  B Complex Tabs (B complex vitamins) .... Take 1 tablet by mouth once a day 8)  Pletal 100 Mg Tabs (Cilostazol) .... Take one tablet by mouth two times a day 9)  Azor 5-40 Mg Tabs (Amlodipine-olmesartan) .... One by mouth once daily 10)  Research Inhaler  .... One puff daily 11)  Hydromet 5-1.5 Mg/83ml Syrp (Hydrocodone-homatropine) .... 5ml by mouth every 4-6 hours as needed cough 12)  Morphine Sulfate Cr 15 Mg Xr12h-tab (Morphine sulfate) .... One by mouth two times a day  Other Orders: T-Basic Metabolic Panel [phone]) T-Hepatic  Function [id number]) T-Lipid Profile [phone]) T-TSH [phone])  Patient Instructions: 1)  [facility] 2)  Please schedule a follow-up appointment in 2 weeks. Prescriptions: MORPHINE SULFATE CR 15 MG XR12H-TAB (MORPHINE SULFATE) one by mouth two times a day  #60 x 0   Entered and Authorized by:   D. Thomos Lemons DO   Signed by:   D. Thomos Lemons DO on 11/16/2010   Method used:   Print then Give to Patient   RxID:   [id number]    Orders Added: 1)  T-Basic Metabolic Panel [[id number]] 2)  T-Hepatic Function [[id number]] 3)  T-Lipid Profile [[id number]] 4)  T-TSH [[id number]] 5)  T- * Misc. Laboratory test [99999] 6)  Est. Patient Level III [[address]]    Current Allergies (reviewed today): ! PENICILLIN ! CEPHALEXIN ! AVELOX (MOXIFLOXACIN HCL)

## 2010-12-19 ENCOUNTER — Telehealth: Payer: Self-pay | Admitting: Internal Medicine

## 2010-12-19 ENCOUNTER — Emergency Department (HOSPITAL_BASED_OUTPATIENT_CLINIC_OR_DEPARTMENT_OTHER)
Admission: EM | Admit: 2010-12-19 | Discharge: 2010-12-19 | Disposition: A | Discharge status: Home / Self Care | Payer: PRIVATE HEALTH INSURANCE | Attending: Emergency Medicine | Admitting: Emergency Medicine

## 2010-12-19 DIAGNOSIS — S0180XA Unspecified open wound of other part of head, initial encounter: Secondary | ICD-10-CM | POA: Insufficient documentation

## 2010-12-19 DIAGNOSIS — W010XXA Fall on same level from slipping, tripping and stumbling without subsequent striking against object, initial encounter: Secondary | ICD-10-CM | POA: Insufficient documentation

## 2010-12-19 DIAGNOSIS — J449 Chronic obstructive pulmonary disease, unspecified: Secondary | ICD-10-CM | POA: Insufficient documentation

## 2010-12-19 DIAGNOSIS — E7800 Pure hypercholesterolemia, unspecified: Secondary | ICD-10-CM | POA: Insufficient documentation

## 2010-12-19 DIAGNOSIS — J4489 Other specified chronic obstructive pulmonary disease: Secondary | ICD-10-CM | POA: Insufficient documentation

## 2010-12-19 DIAGNOSIS — I10 Essential (primary) hypertension: Secondary | ICD-10-CM | POA: Insufficient documentation

## 2010-12-19 DIAGNOSIS — Y92009 Unspecified place in unspecified non-institutional (private) residence as the place of occurrence of the external cause: Secondary | ICD-10-CM | POA: Insufficient documentation

## 2010-12-20 NOTE — Assessment & Plan Note (Signed)
Summary: 2 week follow up/mhf   Vital Signs:  Patient profile:   60 year old male Height:      67.5 inches Weight:      143.75 pounds BMI:     22.26 O2 Sat:      93 % on Room air Temp:     98.1 degrees F oral Pulse rate:   97 / minute Resp:     22 per minute BP sitting:   140 / 60  (right arm) Cuff size:   regular  Vitals Entered By: Glendell Docker CMA (November 30, 2010 11:14 AM)  O2 Flow:  Room air CC: 2 week follow up  Is Patient Diabetic? No Pain Assessment Patient in pain? no      Comments no concerns, back pain has improved    Primary Care Provider:  DThomos Lemons DO  CC:  2 week follow up .  History of Present Illness: 60 y/o white male for folllow up re:  chronic low back pain he has hx of spondylosis and some recent right radicular symptoms he declined referral to neurosurgeon and MRI of LS spine due to financial reasons  pt switched from hydrocodone to oramorph good response but pain not completely controlled pt denies constipation   Preventive Screening-Counseling & Management  Alcohol-Tobacco     Smoking Status: current  Allergies: 1)  ! Penicillin 2)  ! Cephalexin 3)  ! Avelox (Moxifloxacin Hcl)  Past History:  Past Medical History: Hypertension Hyperlipidemia AAA 5.0x4.8   BACK PAIN, LUMBAR    MIGRAINE HEADACHE COPD Golds Stage II     -Fev1 73%  FeV1FVC 49%  Rv 135%  DLCO 68% 03/2007 History of severe hypokalemia and hypomagnesemia secondary to alcohol abuse January of 2006.  Right Visual field cut with supranasal quadrantopia secondary to retinal artery occlusion History of alcoholic hepatitis in January of 2006. Status post trauma to the left eye 15 years ago. Left Achilles tendon rupture PAD with intermittent claudication, bilateral SFA occlusion Left subclavian stenosis by ultrasound  Physical Exam  General:  alert, well-developed, and well-nourished.   Lungs:  normal respiratory effort and normal breath sounds.   Heart:   normal rate, regular rhythm, and no gallop.   Extremities:  trace left pedal edema and trace right pedal edema.   Neurologic:  cranial nerves II-XII intact and gait normal.     Impression & Recommendations:  Problem # 1:  BACK PAIN, LUMBAR (ICD-724.2) Assessment Improved increase of morphine sulfate to 30 mg use miralax regularly   His updated medication list for this problem includes:    Sm Aspirin 325 Mg Tabs (Aspirin) ..... One by mouth once daily    Morphine Sulfate Cr 30 Mg Xr12h-tab (Morphine sulfate) ..... One by mouth two times a day (fill on or after 12/15/2010)  Complete Medication List: 1)  Simvastatin 40 Mg Tabs (Simvastatin) .... One by mouth once daily 2)  Sm Aspirin 325 Mg Tabs (Aspirin) .... One by mouth once daily 3)  Amitriptyline Hcl 25 Mg Tabs (Amitriptyline hcl) .... One by mouth bedtime 4)  Proair Hfa 108 (90 Base) Mcg/act Aers (Albuterol sulfate) .... One to two puff every 4 hours as needed 5)  Albuterol Sulfate (2.5 Mg/61ml) 0.083% Nebu (Albuterol sulfate) .Marland Kitchen.. 1 vial in nebulizer every 4-6 hours as needed for wheezing 6)  Multivitamins Tabs (Multiple vitamin) .... Take 1 tablet by mouth once a day 7)  B Complex Tabs (B complex vitamins) .... Take 1 tablet by mouth once  a day 8)  Pletal 100 Mg Tabs (Cilostazol) .... Take one tablet by mouth two times a day 9)  Azor 5-40 Mg Tabs (Amlodipine-olmesartan) .... One by mouth once daily 10)  Research Inhaler  .... One puff daily 11)  Hydromet 5-1.5 Mg/68ml Syrp (Hydrocodone-homatropine) .... 5ml by mouth every 4-6 hours as needed cough 12)  Morphine Sulfate Cr 30 Mg Xr12h-tab (Morphine sulfate) .... One by mouth two times a day (fill on or after 12/15/2010) 13)  Miralax Powd (Polyethylene glycol 3350) .Marland KitchenMarland KitchenMarland Kitchen 17 grams mixed with 8 oz of water once daily  Patient Instructions: 1)  Please schedule a follow-up appointment in 1 month. (mid April) Prescriptions: MIRALAX  POWD (POLYETHYLENE GLYCOL 3350) 17 grams mixed with 8 oz  of water once daily  #1 month x 5   Entered and Authorized by:   D. Thomos Lemons DO   Signed by:   D. Thomos Lemons DO on 11/30/2010   Method used:   Electronically to        [id number] (retail)       [address]       [place], [place]  [address]       Ph: [phone] or [phone]       Fax: [phone]   RxID:   [id number] MORPHINE SULFATE CR 30 MG XR12H-TAB (MORPHINE SULFATE) one by mouth two times a day (fill on or after 12/15/2010)  #60 x 0   Entered and Authorized by:   D. Thomos Lemons DO   Signed by:   D. Thomos Lemons DO on 11/30/2010   Method used:   Print then Give to Patient   RxID:   [id number]    Orders Added: 1)  Est. Patient Level III [[address]]    Current Allergies (reviewed today): ! PENICILLIN ! CEPHALEXIN ! AVELOX (MOXIFLOXACIN HCL)

## 2010-12-27 ENCOUNTER — Encounter (INDEPENDENT_AMBULATORY_CARE_PROVIDER_SITE_OTHER): Payer: Self-pay

## 2010-12-27 DIAGNOSIS — J449 Chronic obstructive pulmonary disease, unspecified: Secondary | ICD-10-CM

## 2010-12-29 NOTE — Progress Notes (Signed)
Summary: Laceration to chin  Phone Note Call from Patient   Caller: Spouse Details for Reason: laceration to chin Summary of Call: Pt's wife called, small  laceratin to chin thinks he need stitches  , instructed by Dr Artist Pais  to go to ED   Initial call taken by: Darral Dash,  December 19, 2010 9:36 AM

## 2010-12-31 ENCOUNTER — Encounter: Payer: Self-pay | Admitting: Internal Medicine

## 2011-01-05 ENCOUNTER — Ambulatory Visit (INDEPENDENT_AMBULATORY_CARE_PROVIDER_SITE_OTHER): Payer: PRIVATE HEALTH INSURANCE | Admitting: Internal Medicine

## 2011-01-05 ENCOUNTER — Encounter: Payer: Self-pay | Admitting: Internal Medicine

## 2011-01-05 VITALS — BP 130/60 | HR 97 | Temp 98.1°F | Resp 22 | Ht 67.5 in | Wt 162.0 lb

## 2011-01-05 DIAGNOSIS — M545 Low back pain, unspecified: Secondary | ICD-10-CM

## 2011-01-05 MED ORDER — MORPHINE SULFATE CR 30 MG PO TB12: ORAL_TABLET | ORAL | Status: DC

## 2011-01-05 MED ORDER — MORPHINE SULFATE CR 30 MG PO TB12: 30.0000 mg | ORAL_TABLET | Freq: Two times a day (BID) | ORAL | Status: AC

## 2011-01-05 NOTE — Assessment & Plan Note (Signed)
Improved.   Current dose of MS Contin working well Mild constipation Pt understands to use miralax as needed.

## 2011-01-05 NOTE — Progress Notes (Signed)
Subjective:    Patient ID: [name], male    DOB: [birth year], 60 y.o.   MRN: [id number]  HPI  60 y/o male with hx of chronic low back, COPD and htn for follow up  Back pain well controlled.  Mild constipation.  Pt able to perform ADLs w/o pain.   He denies excessive somnolence    Review of Systems No SOB, chronic  cough  Past Medical History  Diagnosis Date  . Hyperlipidemia   . Hypertension   . COPD (chronic obstructive pulmonary disease)     Golds Stage II- Fev1 73% , FVC 49%, Rv 135%, DLCO 68%-03/2007  . Lumbar back pain   . AAA (abdominal aortic aneurysm)     5.0 X 4.8  . Migraine headache   . History of hypokalemia     secondary to alcohol abuse January 2006  . Hypomagnesemia     secondary to alcohol abuse January 2006  . Hepatitis, alcoholic     January 2006  . Visual field cut     right with supranasal quadrantopia to retinal artery occlusion  . Left eye trauma     status post 15 years agoto the left eye  . PAD (peripheral artery disease)     with intermittent claudication, bilateral SFA occlusion  . Subclavian artery stenosis, left     left subclavian stenosis by ultrasound     History   Social History  . Marital Status: Married    Spouse Name: N/A    Number of Children: N/A  . Years of Education: N/A   Occupational History  . FIELD SERVICE [employer]     Unemployed   Social History Main Topics  . Smoking status: Current Everyday Smoker  . Smokeless tobacco: Not on file   Comment: using e-cigarettes  . Alcohol Use: No     fomer abuse quit 2006  . Drug Use: Not on file  . Sexually Active: Not on file   Other Topics Concern  . Not on file   Social History Narrative   Occupation: retired, unemployed since 9/2011Married with two grown daughters current smoker - using e cigarettesAlcohol use-no; former abuse quit 2006 Smoking Status:  current    Past Surgical History  Procedure Date  . Tonsillectomy and adenoidectomy     Family History    Problem Relation Age of Onset  . Heart attack Father     died in his 57's secondary to mayocardial infarction  . Throat cancer Mother     died young age secondary to throat cancer    Allergies  Allergen Reactions  . Cephalexin     REACTION: anaphylactic shock  . Moxifloxacin     REACTION: achilles tendson rupture  . Penicillins     REACTION: rash    Current Outpatient Prescriptions on File Prior to Visit  Medication Sig Dispense Refill  . albuterol (PROAIR HFA) 108 (90 BASE) MCG/ACT inhaler Inhale 2 puffs into the lungs every 4 (four) hours as needed.        Marland Kitchen albuterol (PROVENTIL) (2.5 MG/3ML) 0.083% nebulizer solution Take 2.5 mg by nebulization every 4 (four) hours as needed.        Marland Kitchen amitriptyline (ELAVIL) 25 MG tablet Take 25 mg by mouth at bedtime.        Marland Kitchen amLODipine-olmesartan (AZOR) 5-40 MG per tablet Take 1 tablet by mouth daily.        Marland Kitchen aspirin 325 MG tablet Take 325 mg by mouth daily.        Marland Kitchen  b complex vitamins tablet Take 1 tablet by mouth daily.        . cilostazol (PLETAL) 100 MG tablet Take 100 mg by mouth 2 (two) times daily.        Marland Kitchen HYDROcodone-homatropine (HYCODAN) 5-1.5 MG/5ML syrup Take 5 mLs by mouth every 6 (six) hours as needed.        . multivitamin (THERAGRAN) per tablet Take 1 tablet by mouth daily.        . NON FORMULARY Research Inhaler       . polyethylene glycol (MIRALAX / GLYCOLAX) packet Take 17 g by mouth daily.        . simvastatin (ZOCOR) 40 MG tablet Take 40 mg by mouth at bedtime.        Marland Kitchen DISCONTD: morphine (MS CONTIN) 30 MG 12 hr tablet Take 30 mg by mouth 2 (two) times daily. Fill on or after 12/15/2010         BP 130/60  Pulse 97  Temp(Src) 98.1 F (36.7 C) (Oral)  Resp 22  Ht 5' 7.5" (1.715 m)  Wt 162 lb (73.483 kg)  BMI 25.00 kg/m2  SpO2 96%       Objective:   Physical Exam  Constitutional: He appears well-developed and well-nourished. No distress.  Cardiovascular: Normal rate, regular rhythm and normal heart sounds.    Pulmonary/Chest: Effort normal. No respiratory distress. He has wheezes. He has no rales.  Psychiatric: He has a normal mood and affect. His behavior is normal.          Assessment & Plan:

## 2011-01-23 ENCOUNTER — Other Ambulatory Visit: Payer: Self-pay

## 2011-01-25 ENCOUNTER — Telehealth: Payer: Self-pay | Admitting: Internal Medicine

## 2011-01-25 NOTE — Telephone Encounter (Signed)
Request for a refill or new prescription  Amitriptyline hcl 25mg  tab. Take 1 tablet by mouth at bedtime. Qty 90.0 ea. Last fill 2.9.12

## 2011-01-26 MED ORDER — AMITRIPTYLINE HCL 25 MG PO TABS: 25.0000 mg | ORAL_TABLET | Freq: Every day | ORAL | Status: DC

## 2011-01-26 NOTE — Telephone Encounter (Signed)
Rx refill sent to pharmacy. 

## 2011-01-26 NOTE — Telephone Encounter (Signed)
Ok to refill x 3 

## 2011-01-29 ENCOUNTER — Other Ambulatory Visit: Payer: Self-pay | Admitting: Cardiovascular Disease

## 2011-02-14 NOTE — Assessment & Plan Note (Signed)
[facility]                             PULMONARY OFFICE NOTE   NAME:[name], [name]                         MRN:          [id number]  DATE:07/26/2007                            DOB:          April 29, 1951    This is a 60 year old white male with a history of chronic obstructive  lung disease, asthmatic bronchitis, still actively smoking, coughing up  thick dark gray mucus, chest tightness and wheezing noted.   He maintains:  1. Symbicort 2 sprays b.i.d. 160/4.5  2. Spiriva 1 capsule daily.   PHYSICAL EXAMINATION:  VITAL SIGNS:  Temperature 97, blood pressure  120/64, pulse 54, saturation 99% on room air.  CHEST:  Inspiratory and expiratory wheeze with poor airflow.  CARDIAC:  Regular rate and rhythm without S3, normal S1 and S2.  ABDOMEN:  Soft and nontender.  EXTREMITIES:  No edema or clubbing.  SKIN:  Clear.  NEUROLOGIC:  Intact.  HEENT:  No jugular venous distension, no lymphadenopathy. Oropharynx  clear.  NECK:  Supple.   IMPRESSION:  Asthmatic bronchitis with acute flare.   PLAN:  Receive prednisone 40 mg daily for rapid taper and a 5 day course  of Avelox 400 mg daily. He also received Chantix to pursue smoking  cessation. We will see the patient back in follow up.     Charlcie Cradle Delford Field, MD, [facility]  Electronically Signed    PEW/MedQ  DD: 07/26/2007  DT: 07/28/2007  Job #: [id number]

## 2011-02-14 NOTE — Assessment & Plan Note (Signed)
[facility]                             PULMONARY OFFICE NOTE   NAME:WEBBMandy, Fitzwater                         MRN:          [id number]  DATE:03/21/2007                            DOB:          02/28/1951    Mr. Macken is a 60 year old white male, history of chronic obstructive  lung disease, asthmatic bronchitis, smoking use.  He is markedly better  since stopping smoking a month-and-a-half ago, maintaining on Spiriva  daily, Symbicort 160/4.5 at two sprays b.i.d., albuterol p.r.n.   EXAMINATION:  VITAL SIGNS:  Temperature 98, blood pressure 118/64, pulse  63, saturation 94% in room air.  CHEST:  Showed distant breath sounds with no active wheezing, no rales.  CARDIAC:  Showed a regular rate and rhythm without S3; normal S1, S2.  ABDOMEN:  Soft, nontender.  EXTREMITIES:  Showed no edema or clubbing.  SKIN:  Clear.  NEUROLOGIC:  Intact.  HEENT:  Showed no jugular venous distention or lymphadenopathy,  oropharynx clear, neck supple.   IMPRESSION:  That of asthmatic bronchitis with positive response to  bronchodilator therapy, ex-smoking.  Note is made of pulmonary functions  today showing an FEV1 of 2.21 which is 73% predicted; FVC of 4.52, 107%  predicted; total lung capacity at 124% predicted; diffusion capacity at  68% predicted.   PLAN:  Maintain inhaled medicines as currently dosed, and will see the  patient back in return followup in 3 months.     Charlcie Cradle Delford Field, MD, [facility]  Electronically Signed    PEW/MedQ  DD: 03/21/2007  DT: 03/22/2007  Job #: [id number]

## 2011-02-14 NOTE — Assessment & Plan Note (Signed)
[name]                           PRIMARY CARE OFFICE NOTE   NAME:[name], [name]                         MRN:          [id number]  DATE:05/28/2007                            DOB:          [birth year]    CHIEF COMPLAINT:  New patient to practice, acute right eye vision loss.   HISTORY OF PRESENT ILLNESS:  The patient is a 60 year old white male  here to establish primary care.  Her was formerly followed by [name], M.D. of [facility].  He was evaluated at [facility] on May 07, 2007, for partial right eye blindness.  He underwent ophthalmologic workup with [name], M.D.  He was  diagnosed with retinal artery occlusion with [place] plaque.  There was a trial of paracentesis of the right eye, however, no  improvement to his vision loss.  Hospitalization for embolic workup was  recommended.  The patient refused.  An MRI was obtained as an outpatient  which showed no evidence of acute ischemic, mild to moderate mucosal  thickening of the ethmoid.  He underwent MRA of the head which noted the  following results:  Suspicion of high grade hemodynamically significant  stenosis of the dominant left vertebral artery at its origin,  approximately 30-40% stenosis of the left internal carotid artery at the  bulb.   He has followed up with Dr. Chales Young who recommended a complete embolic  workup and was referred to our office.   In addition to right vision loss, he has a history of frequent  headaches.  15 years ago while working he had a piece of metal lodge  into his left eye.  Since that eye trauma he has noted chronic headaches  which he has managed to avoid by decreasing light exposure to his left  eye.  However, with right eye vision loss, he has been more dependent on  vision from his left eye.  He states if the lights hit my left eye a  certain way it triggers a severe headache.  The patient's severity 9  to  9-1/2 out of 10.  The pain is usually bilateral.  He has taken multiple  NSAIDS and also Vicodin in the past.  He has also suffered from frequent  low back pain and has been on ibuprofen 800 mg twice a day for  approximately 2 weeks.  He denies any chronic shoulder pain or hip  girdle pain.  He denies any jaw pain/discomfort.   He is followed by [name]. Delford Field, MD, [facility] for chronic obstructive  pulmonary disease and emphysema.  He continues to smoke despite urging  from his physicians to quit.  He has greater than 80-pack-year history.  He was able to quit for approximately 3 months while trying Chantix.   PAST MEDICAL HISTORY:  1. Chronic obstructive pulmonary disease/asthmatic bronchitis with      ongoing tobacco abuse.  2. Recovered alcoholic.  3. History of severe hypokalemia and hypomagnesemia secondary to      alcohol abuse January of 2006.  4. History of alcoholic  hepatitis in January of 2006.  5. Migraine headaches.  6. Status post trauma to the left eye 15 years ago.  7. Chronic low back pain.  8. Retinal artery occlusion with supranasal quadrantopia.   CURRENT MEDICATIONS:  1. Symbicort two puffs b.i.d.  2. Ibuprofen 800 mg twice daily.  3. Spiriva capsule once daily.  4. Multivitamin once daily.  5. B complex once daily.  6. Aspirin 240 mg twice daily.   ALLERGIES:  PENICILLIN and CEPHALOSPORINS cause anaphylactic shock.   SOCIAL HISTORY:  The patient is married.  He works as a Environmental manager.  He currently smokes almost two packs per day, stopped  drinking alcohol in 2006.  He has two grown daughters.   FAMILY HISTORY:  Father died in his mid 91s secondary to massive MI.  Mother died at a young age presumed secondary to throat cancer or other  cancer.  His brother is noted to have lung aneurysm but he also was an  alcoholic.   REVIEW OF SYSTEMS:  As noted above, the patient does have chronic cough  and dyspnea on exertion.  Denies frequent heartburn.   No nausea,  vomiting, constipation, or diarrhea.  He has not had colon cancer  screening in the past.  All other systems negative.   PHYSICAL EXAMINATION:  VITAL SIGNS:  Height 5 feet 7-1/2 inches, weight  160.8 pounds, temperature 97.3, pulse 60, and blood pressure 153/76 in  the left arm in a seated position.  GENERAL:  The patient is a pleasant, thin, 60 year old white male who  appears much older than stated age.  HEENT:  Normocephalic and atraumatic.  Pupils equal, round, and reactive  to light bilaterally.  Extraocular movements intact.  The patient was  anicteric.  Conjunctivae within normal limits.  External auditory canals  and tympanic membranes were clear bilaterally.  Hearing was grossly  normal.  Oropharyngeal examination was unremarkable.  NECK:  Somewhat thick, but no adenopathy, carotid bruits, or  thyromegaly.  CHEST:  Decreased breath sounds throughout.  Scattered wheezing right  greater than left.  CARDIOVASCULAR:  Regular rate and rhythm.  No significant murmurs, rubs,  or gallops appreciated.  ABDOMEN:  Soft and nontender.  Positive bowel sounds.  Unable to  appreciate any organomegaly.  MUSCULOSKELETAL:  He does have diminished pedis dorsalis pulses left  greater than right.  Also diminished posterior tibial pulse in the left  lower extremity.  NEUROLOGY:  Cranial nerves II-XII grossly intact.  He was nonfocal.   Review of medical records notes that he had a CAT scan of his chest for  dyspnea in February of 2008, incidentally found abdominal aortic  aneurysm 4.2 x 3.8 cm.   IMPRESSION:  1. History of retinal artery occlusion.  2. Asthmatic bronchitis, chronic obstructive pulmonary disease with      ongoing tobacco abuse.  3. Abdominal aortic aneurysm 4.2 x 3.8 cm in February of 2008.  4. Probable peripheral artery disease.  5. History of alcohol abuse.  6. Chronic headache presumed secondary to previous left eye injury.  7. Chronic low back pain with  NSAIDS use.  8. Health maintenance.   RECOMMENDATIONS:  We will complete embolic workup and the patient will  be sent for two-dimensional echocardiogram and carotid Dopplers.  I  agree with ophthalmologist regarding referral to neurologist for  headache workup.  I doubt that his vision loss is secondary to  arteritis, however, given his history I will check a sed rate.   I strongly  encouraged the patient to discontinue smoking and restart  Chantix.  He was given a prescription.   He is to discontinue all ibuprofen and use Vicodin 5/500 one to two q.8-  12 hours as needed.   The patient will be sent for fasting lipid profile, thyroid function.  He will likely need statin therapy.   Follow up in 2-4 weeks.     Barbette Hair. Artist Pais, DO  Electronically Signed    RDY/MedQ  DD: 05/28/2007  DT: 05/29/2007  Job #: [id number]

## 2011-02-14 NOTE — Consult Note (Signed)
[name], [name]                ACCOUNT NO.:  [id number]   MEDICAL RECORD NO.:  [id number]          PATIENT TYPE:  EMS   LOCATION:  ED                           FACILITY:  [facility]   PHYSICIAN:  Elliot Cousin, MD      DATE OF BIRTH:  [birth year]   DATE OF CONSULTATION:  05/07/2007  DATE OF DISCHARGE:  05/07/2007                                 CONSULTATION   REASON FOR CONSULTATION:  Partial blindness in the right eye.   HISTORY OF PRESENT ILLNESS:  The patient is a 60 year old man with a  past medical history significant for chronic obstructive pulmonary  disease, who presents to the emergency department with the chief  complaint of partial blindness in the right eye.  The patient presented  to the emergency department at [facility] last night, after  suffering partial visual loss in his right eye at approximately 7:30  a.m. yesterday morning.  The patient was at work when he experienced  partial blindness in his right eye, without any other symptoms. There  was no trauma.  He had difficulty seeing superiorly and medially.  He  actually worked throughout the day and decided to come to the emergency  department last night.  He was evaluated by the emergency department  physician at that time.  The emergency department physician apparently  consulted Dr. [name], ophthalmologist, for further evaluation  and management.  Per the report, Dr. Chales Abrahams recommended that the patient  come in to his office today for further evaluation.  The patient was  evaluated by Dr. Chales Abrahams today, and Dr. Marcille Blanco office notes accompany the  patient.  Per Dr. Marcille Blanco assessment, the patient has a retinal artery  occlusion with a Hollenhorst's plaque in the arteriole, which runs deep  to the retinal vein.  He obtained a telephone consultation with  ophthalmologist, Dr. Alford Highland. Rankin, who recommended a trial of  paracentesis of the right eye.  The patient underwent the paracentesis  of the  right eye, without complication.  Dr. Chales Abrahams then spoke with the  patient's pulmonologist, Dr. Charlcie Cradle. Delford Field, who recommended that the  patient come back to the emergency department for the hospitalist  service to admit the patient to expedite an embolic workup.   Currently, the patient has no other complaints.  He has mild chronic  headaches, which he attributes to a left eye injury many years ago.  His  headaches have been no worse than usual over the past two days.  He  denies dizziness, difficulty speaking, difficulty swallowing, focal  weakness or incontinence of his bladder and bowels.  He has no chest  pain or shortness of breath.  He actually wonders why he is here for  admission to the hospital, when he believes that there is nothing else  anyone can do about his right eye.  The patient was evaluated by the emergency department physician, Dr.  Markham Jordan L. Effie Shy, today.  A CT scan of the head was performed and  revealed no acute intra-cranial findings.  Neurologist, Dr. Porfirio Mylar  Dohmeier, was consulted by  Dr. Effie Shy.  The hospitalist's service,  however, was called to admit the patient.   PAST MEDICAL HISTORY:  1. Diagnosis of right visual field cut with superonasal quadrantopia,      secondary to retinal artery occlusion.  2. Asthmatic bronchitis/chronic obstructive pulmonary disease, with      ongoing tobacco abuse.  The patient is followed by pulmonologist,      Dr. Shan Levans.  3. Recovering alcoholic; the patient has been abstinent for more than      2 years.  4. History of severe hypokalemia and hypomagnesemia, secondary to      alcohol abuse in January 2006.  5. History of alcoholic hepatitis in January 1610.  6. Encephalitis, secondary to a jaw abscess in 1988.  7. Migraine headaches.  8. Status post injury to the left eye approximately 15 years ago.  9. Chronic low back pain.  10.Status post tonsillectomy.  11.Tobacco abuse.   MEDICATIONS:  1. Ibuprofen 200  mg twice daily.  2. Albuterol MDI, two puffs twice daily.  3. Spiriva inhaler, one inhalation once daily.  4. Symbicort, two puffs twice daily.  5. Aspirin 81 mg daily.  (The patient recently started taking,      although he has not been consistent.)   ALLERGIES:  The patient has allergy to CEPHALOSPORINS AND PENICILLINS,  WHICH APPARENTLY CAUSE ANAPHYLAXIS.   SOCIAL HISTORY:  The patient is married.  He lives in [place].  He  has two children.  He is employed as a Pensions consultant with the Development worker, international aid.  He stopped drinking alcohol in 2006.  He smokes 1-1/2 packs  of cigarettes a day.  He denies any illicit drug use.   FAMILY HISTORY:  His father died of a heart attack in his 77's.  His  mother died of a possible throat cancer in her 23's.   PHYSICAL EXAMINATION:  VITAL SIGNS:  Temperature 97.8 degrees, blood  pressure 126/65, pulse 58, respirations 18, oxygen saturation 94% on  room air.  GENERAL:  The patient is a pleasant 59 year old medium-framed Caucasian  man, who is currently sitting up in bed, in no acute distress.  HEENT:  Head normocephalic and atraumatic.  Pupils equal, round,  reactive to light.  Extraocular movements are intact.  Conjunctivae  clear.  Sclerae white.  Nasal mucosa is mildly dry.  No sinus  tenderness.  Oropharynx reveals dentures.  Mucous membranes are moist.  No posterior exudates or erythema.  NECK:  Supple, no adenopathy.  No thyromegaly, no bruit, no jugular  venous distention.  LUNGS:  Clear to auscultation bilaterally.  HEART:  S1 and S2, with no murmurs, rubs or gallops.  ABDOMEN:  Positive bowel sounds, soft, nontender, non-distended.  No  hepatosplenomegaly.  No masses palpated.  EXTREMITIES:  Pedal pulses palpable bilaterally.  No pretibial edema, no  pedal edema.  NEUROLOGIC:  The patient is alert and oriented x3.  Cranial nerves II-  XII  are intact, with the exception of visual loss superiorly and  medially on the right.  Strength is  5/5 throughout.  Sensation intact.  Gait is within normal limits.   ADMISSION LABORATORY DATA:  Electrocardiogram reveals sinus bradycardia  with a heart rate of 59 beats per minute, left anterior fascicular  block.   A CT scan of the head reveals no acute intra-cranial findings.   WBC 8.1, hemoglobin 13.8, platelets 179.  Urinalysis positive for 7-10  WBCs and sperm, otherwise negative.  Sodium 142, potassium 4.2, chloride  106, CO2  of 27, glucose 102, BUN 14, creatinine 0.84.  Calcium 9.6,  total protein 6.6, albumin 4.1, AST 14, ALT 12.  CK-MB 1, troponin I  less than 0.05.  Myoglobin 43.1.   ASSESSMENT/PLAN:  1. Right retinal artery occlusion/stroke, with resultant superonasal      quadrantopia:  As indicated above, the patient underwent a trial of      paracentesis of the right eye, per Dr. Chales Abrahams, earlier today.  The      patient has no other focal deficits at this time.  The patient was      referred to the emergency department for an embolic workup.  The      patient states what is done is done.  He does not want to stay      and/or be admitted for further evaluation.  He has grave concerns      about the cost of the hospitalization and the multiple tests that      would be needed for evaluation.  He was apparently released from      the [facility] because of his inability to pay the bill.      I consulted Dr. Vickey Huger, who happened to be in the emergency      department tonight.  I discussed the dilemma with her.  We both      agreed that the patient needed to stay in the hospital for further      evaluation; however, Dr. Vickey Huger did speak with the patient and      understood his concerns.  As an alternative, the patient was      advised to follow up with his only regular physician, Dr. Shan Levans, in the next few days, to request that Dr. Delford Field assist      with ordering an MRI of the brain and MRA of the brain and neck,      for an outpatient  evaluation.  The patient was also strongly      advised to stop smoking.  He was advised to increase the aspirin      therapy to 325 mg daily or three to four 81 mg tablets daily.  The      patient voiced understanding.   DISPOSITION:  He was therefore discharged to home with the understanding  that he needed to come back to the hospital if there were any worsening  neurological symptoms.      Elliot Cousin, M.D.  Electronically Signed     ______________________________  Elliot Cousin, MD    DF/MEDQ  D:  05/08/2007  T:  05/08/2007  Job:  [id number]   cc:   [name], M.D.  Fax: [phone]   Charlcie Cradle Delford Field, MD, FCCP  [address]  [place]  [place] [address]

## 2011-02-14 NOTE — Assessment & Plan Note (Signed)
[facility]                             PULMONARY OFFICE NOTE   NAME:[name], [name]                         MRN:          [id number]  DATE:09/06/2007                            DOB:          November 21, 1950    [name] is a 60 year old white male with a history of chronic  obstructive lung disease, asthmatic bronchitic component, now ex-smoker  since October of this year.   He maintains:  1. Symbicort 2 sprays b.i.d.  2. Spiriva daily.   PHYSICAL EXAMINATION:  VITAL SIGNS:  Temperature 98, blood pressure  116/70, pulse 60, saturation 98% on room air.  CHEST:  Diminished breath sounds without evidence of wheeze or rhonchi.  CARDIAC:  Regular rate and rhythm without S3, normal S1 and S2.  ABDOMEN:  Soft and nontender.  EXTREMITIES:  No edema or clubbing.  SKIN:  Clear.   IMPRESSION:  Chronic obstructive lung disease with an asthmatic  bronchitic component.   PLAN:  Maintained inhaled medicines as currently prescribed. We will see  the patient back in return follow up.     Charlcie Cradle Delford Field, MD, [facility]  Electronically Signed    PEW/MedQ  DD: 09/06/2007  DT: 09/07/2007  Job #: [id number]

## 2011-02-17 NOTE — Assessment & Plan Note (Signed)
[facility]                             PULMONARY OFFICE NOTE   NAME:[name], [name]                         MRN:          [id number]  DATE:12/06/2006                            DOB:          05-Jun-1951    CHIEF COMPLAINT:  Evaluate dyspnea.   HISTORY OF PRESENT ILLNESS:  A 60 year old white male previously saw Dr.  Fannie Knee between 2002 and 2004 with advanced COPD.  He has now had  increased dyspnea for the last 3 weeks with chest pain, rib pain, and  muscle pain.  He has a dry cough.  Denies acid reflux symptoms.  Has  increased wheezing.  He is short of breath with rest and exertion.  He  smokes a pack-and-a-half a day of cigarettes.  Has done so for 44 years.  Has not tried to quit smoking.  Denies any snoring or observed apneas.  He has had bronchitis on several occasions for the past several years,  at least 1 time annually.  Recently finished a course of antibiotics and  prednisone taper, which he is completing.  He had been on Flovent for 2  years and Spiriva for 2 years, and then added Symbicort from Flovent  recently.  Also an albuterol neb b.i.d.  He has no sore throat, tooth or  dental problems.  No headaches.  No nasal congestion, sneezing, itching,  earache, or anxiety.  He is referred for further evaluation.   PAST MEDICAL HISTORY:   MEDICAL:  No history of hypertension, heart disease, increased  cholesterol.  No history of stroke, cancer, sleep apnea, or disorders of  the central nervous system.   OPERATIVE:  None.   MEDICATION ALLERGIES:  PENICILLIN.  KEFLEX.   CURRENT MEDICATIONS:  1. Spiriva daily.  2. Symbicort 160/4.5 two sprays b.i.d.  3. Albuterol nebs b.i.d.  4. Albuterol MDI p.r.n.   SOCIAL HISTORY:  Smoking history as noted above.  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  Is married.   FAMILY HISTORY:  Cancer in mother.   REVIEW OF SYSTEMS:  Otherwise, noncontributory.   PHYSICAL EXAM:  He  is a middle-aged male in no distress.  Temperature 97.8, blood pressure 110/64, pulse 67, saturation 95% on  room air.  CHEST:  Distant breath sounds with prolonged expiratory phase.  No  wheeze or rhonchi were noted.  CARDIAC:  Regular rate and rhythm without S3.  Normal S1, S2.  ABDOMEN:  Soft, protuberant.  Bowel sounds were active.  EXTREMITIES:  No edema, clubbing, or venous disease.  SKIN:  Clear.  NEUROLOGIC:  Intact.  HEENT:  No JVD.  No lymphadenopathy.  Oropharynx clear.  NECK:  Supple.   LABORATORY DATA:  CT scan of the chest from November 30, 2006 showed no  pulmonary embolism and showed emphysematous change, but no active  process.  No pulmonary functions are yet obtained on this patient.   IMPRESSION:  Chronic obstructive lung disease with asthmatic bronchitic  emphysematous components with active smoking.   PLAN:  The patient will continue Symbicort and Spiriva as currently  dosed.  The patient will switch to nebulizer to p.r.n.  The patient to  re-pulse prednisone at 40 mg a day with a slow taper.  The patient to  begin Avelox 400 mg a day for a 5-day course.  The patient to pursue  smoking cessation with Chantix per protocol.  The patient to return with  pulmonary functions in 4 weeks.     Charlcie Cradle Delford Field, MD, [facility]  Electronically Signed    PEW/MedQ  DD: 12/06/2006  DT: 12/06/2006  Job #: [id number]   cc:   [name], M.D.

## 2011-02-17 NOTE — Discharge Summary (Signed)
[name], [name]                ACCOUNT NO.:  [id number]   MEDICAL RECORD NO.:  [id number]          PATIENT TYPE:  INP   LOCATION:  0359                         FACILITY:  [facility]   PHYSICIAN:  Kela Millin, M.D.DATE OF BIRTH:  12/01/1950   DATE OF ADMISSION:  10/18/2004  DATE OF DISCHARGE:                                 DISCHARGE SUMMARY   PRIMARY CARE PHYSICIAN:  Dr. Theresia Lo.   DISCHARGE DIAGNOSES:  1.  Severe hypokalemia - secondary to deficient diet in alcoholic patient.  2.  Hypomagnesemia - secondary to alcoholism.  3.  Rhabdomyolysis - secondary to morning after syndrome/alcoholism.  4.  Left hand/wrist swelling - cellulitis versus gout.  5.  Abnormal liver function tests/hepatosplenomegaly - probable portal      hypertension.  6.  Alcohol abuse/probable delirium tremens.  7.  Right shoulder pain - degenerative changes per plain films.  8.  Chronic obstructive pulmonary disease with continued tobacco abuse.   STUDIES:  1.  CT scan of head - normal exam.  2.  Chest x-ray - borderline cardiomegaly and mild changes of COPD, no acute      abnormality.  3.  Right shoulder films - minimal inferior glenohumeral spur formation and      mild inferior clavicular spur formation - degenerative changes.  4.  CT scan of abdomen and pelvis - hepatosplenomegaly with probable portal      hypertension, cirrhosis is not excluded.  Intraabdominal aortic aneurysm      measuring 3.8 x 4 cm in the greatest transverse diameter.  5.  Wrist and hand films - no acute abnormality, diffuse soft tissue      swelling.   CONSULTATIONS:  Neurology - Dr. Sandria Manly.   HISTORY:  The patient is a 60 year old white male with past medical history  significant for alcohol abuse, COPD, and back pain who presented to the ER  with complaints of progressive weakness, proximal distribution, and  inability to lift his right arm x1 day.  In the ER the patient was evaluated  and a CT scan of head did not show  any acute changes.  He also had  chemistries done and his potassium was 1.5.  Point of care enzymes were done  and his CK was noted to be elevated at 2000.  His LFTs were also elevated.  There was also concern about a possible cerebrovascular accident and so  neurology was consulted in the ED and Dr. Sandria Manly saw the patient, and his  impression was he did not think that it was an acute CVA.  The [facility] was called to further evaluate the patient.  At the time  he was seen in the ER, he was alert and oriented in no acute distress.  The  patient continued to complain of weakness proximally in his lower legs and  also in his right upper extremity with some pain in the shoulder.  He denied  any falls or trauma.  He described progressive weakness over 4-5 days,  denied fevers, chills.  Also denied nausea, vomiting, diarrhea,  constipation, and he had not  taken any new medications.  The patient  reported weight loss of about 20 pounds over 4-6 months and that he had not  been trying to lose any weight.  He stated that he had had a decreased  appetite for some time prior to presentation.  He denied early satiety.  He  admitted to three to four drinks of vodka per day, stating about 7 ounces a  day, and that he had been doing this for many years.  He was admitted to the  [facility] for further evaluation and management.   His physical exam upon admission as per Dr. Nehemiah Settle revealed a temperature of  97.1, a blood pressure of 154/77, respiratory rate of 20, O2 saturation of  94%, and the pertinent findings on exam:  Lung exam - bilateral rhonchi and  on his extremities no edema, +2 pulses.  On his neurologic exam, cranial  nerves II-XII were intact.  He had a good shoulder shrug and he had obvious  weakness in the right upper extremity particularly at the shoulder area of  about 1/5, and he had a good strong grip.  He had 3/5 flexion in the right  upper arm.  The  patient's left arm was essentially within normal limits.  In  the lower extremities he had good plantar and dorsiflexion.  He had  significant weakness proximally in the hip area of about 3/5.  His DTRs did  show a slow upstroke.  His gait was not tested secondary to weakness and  sensory appeared to be grossly intact, no tremors and no rigidity.   LABORATORY DATA:  White cell count 6.9, hemoglobin 12.4, hematocrit 35.2,  MCV 90.6, platelet count 200, neutrophil count 75.  Urinalysis:  Specific  gravity 1.003, urine pH of 7, urine negative for infection.  His sodium 130,  potassium 1.5, chloride 84, CO2 43, glucose 116, BUN 1, creatinine 0.7,  calcium 8, albumin 3.1, total protein 5.5, AST 61, ALT 20, alkaline  phosphatase 132, total bilirubin 2.1.  CK total 2023.  The admission studies  as stated above.   HOSPITAL COURSE:  #1 - SEVERE HYPOKALEMIA.  Upon admission the patient was  started on IV potassium supplementation.  He was also given oral potassium.  His magnesium was checked and it was low - 1.3.  He was monitored on  telemetry in the step-down unit and he remained in normal sinus rhythm with  no Q waves or ST segment depression or ventricular ectopia.  He required  multiple runs of IV potassium as well as oral potassium.  His magnesium was  also replaced both intravenously and orally.  The patient remained  hemodynamically stable and on follow-up recheck of his labs his potassium  was 4.7.  Impression was that his severe hypokalemia is secondary to a  deficient diet in this alcoholic.  Family also confirmed his poor oral  intake - diet outside of alcohol.   #2 - HYPOMAGNESEMIA.  Also felt to be secondary to alcoholism.  The  patient's magnesium was replaced both intravenously and orally.  His last  magnesium on recheck was 2.0.   #3 - RHABDOMYOLYSIS.  The patient's CK was elevated upon admission and he  was hydrated with normal saline - saline diuresis.  His creatinine  remained within normal limits throughout his hospital stay.  His CKs gradually  decreased and the last one rechecked was 732.  He is clinically improved.  He was also given some doses of IV Lasix while  in the hospital as he was  retaining the fluids secondary to his liver disease.   #4 - ABNORMAL LIVER FUNCTION TESTS WITH HEPATOSPLENOMEGALY/PROBABLE PORTAL  HYPERTENSION.  Secondary to alcohol abuse.  The patient was counseled to  quit alcohol.  He was given information about community resources for  quitting alcohol.  I also talked with the patient's family regarding the  extent of all the complications that he was having due to alcohol and they  stated that they would encourage the patient to quit.  The patient had  hepatitis panel done in the hospital and this was negative.   #5 - WEAKNESS.  The impression was that this was secondary to alcohol.  Neurology followed and determined that the patient did not have any CVA.  The patient also had a TSH done and it was normal at 1.726.  The patient's  strength improved significantly following normalization of his potassium.  He was seen by physical therapy in the hospital and he was ambulating prior  to discharge.   #6 - CHRONIC OBSTRUCTIVE PULMONARY DISEASE.  The patient was started on  Combivent as well as Flovent in the hospital.  He was also counseled to quit  tobacco.  He will be discharged on Combivent and Flovent.   #7 - LEFT HAND PAIN/SWELLING.  On his fourth hospital day, the patient  developed pain and swelling in his left hand and wrist area that started  around the site of his IV.  The IV was removed from his left hand and that  hand was kept elevated with compresses and treated symptomatically.  On the  next hospital day he became febrile with further swelling of that hand and  wrist, and on further questioning the patient he admitted to a history of  gout and blood cultures were done which did not grow any organisms.  The   patient was empirically started on antibiotics and also on Indocin to cover  for possible cellulitis versus gout.  The swelling has gradually improved  following these interventions and he has remained afebrile with no  leukocytosis.   DISCHARGE MEDICATIONS:  1.  Zithromax 250 mg one p.o. daily x5 days.  2.  Flovent 110 two puffs b.i.d.  3.  Combivent two puffs q.i.d.  4.  Spiriva one capsule inhaler daily.  5.  Protonix 40 mg one p.o. daily.  6.  Thiamine 100 mg one p.o. daily.  7.  Multivitamin one p.o. daily.  8.  Folate 1 mg one p.o. daily.  9.  Vicodin 5 mg one to two p.o. q.4-6h. p.r.n.  10. Indocin 50 mg p.o. b.i.d. p.r.n.   FOLLOW-UP CARE:  Dr. Theresia Lo in 1 week.   DISCHARGE CONDITION:  Improved/stable.                                               ______________________________  Kela Millin, M.D.    ACV/MEDQ  D:  10/24/2004  T:  10/24/2004  Job:  [id number]   cc:   [name], M.D.  [address]  [place]  [place] [address]  Fax: [phone]

## 2011-02-17 NOTE — Consult Note (Signed)
[name], [name]                ACCOUNT NO.:  [id number]   MEDICAL RECORD NO.:  [id number]          PATIENT TYPE:  EMS   LOCATION:  ED                           FACILITY:  [facility]   PHYSICIAN:  Genene Churn. Love, M.D.    DATE OF BIRTH:  09/23/1951   DATE OF CONSULTATION:  10/18/2004  DATE OF DISCHARGE:                                   CONSULTATION   This 60 year old right-handed white married male from [place], [place] is seen in the emergency room at the request of Dr. Carleene Cooper  for evaluation of weakness.   HISTORY OF PRESENT ILLNESS:  Mr. Chalmers has a past history of chronic  obstructive pulmonary disease without a history of high blood pressure or  diabetes.  Over the last four days, he has developed progressive weakness in  his arms and legs proximally without associated falls.  He has developed  pain in his right shoulder and left collarbone.  There has been no numbness  and no bowel or bladder dysfunction.  He has no known history of thyroid  disease.  He does drink approximately four drinks per day, and he smokes one  pack per day of cigarettes.   His medications are Combivent inhaler, Aerobid inhaler, and Spiriva.  He has  been taking Tylenol for pain, but according to his wife, recently changed to  Advil.   He was hospitalized in 2004 at [facility] for anaphylactic shock  related to the Keflex/penicillin therapy.   He has had a tonsillectomy and he has had an abscess of the jaw, from which  he became encephalopathic.   He does not use drugs.  He drinks four drinks per day.  He smokes a pack per  day of cigarettes.  Education is to the 11th grade.   FAMILY HISTORY:  His mother died at 74 from cancer.  His father died at 65  from myocardial infarction.  He has a brother who died at 76.  He has a  sisters, 57, 53, and 100, living and well.   PHYSICAL EXAMINATION:  VITAL SIGNS:  Blood pressure in the right and left  arm of 140/80, heart rate 88 and  regular.  He had a right carotid bruit and  right supraclavicular bruit heard.  He was afebrile.  GENERAL:  A well-developed white male.  MENTAL STATUS:  Alert and oriented x 3.  Follows three-step commands.  Cranial nerves and visual fields are full.  Disks flat.  Spontaneous venous  pulsations seen.  Extraocular movements are full.  Corneals present.  Facial  sensation is equal.  Hearing decreased.  Air conduction greater than bone  conduction.  Tongue midline.  Uvula midline.  Gag is present.  Motor  examination with weakness of the deltoid.  Biceps, brachial radialis, and  triceps are worse on the right than the left.  Difficulty in lifting his  right arm and abducting it at the shoulder on the right because of pain.  He  had 4+/5 strength proximally in the left arm and 4+/5 strength proximally in  his lower extremities,  including the iliopsoas.  The quadriceps femoris,  gluteus maximus, gluteus medius, and the hamstrings.  Other muscle groups  seem to be in the 5/5 range.  Deep tendon reflexes were all intact except  for decrease and absent right biceps jerk and right brachial radialis  reflex.  There is no evidence of any sensory loss.  NECK:  Supple.   Laboratory data revealed a potassium of 1.5.   Chest x-ray showed no definite abnormalities except for COPD.  Right  shoulder x-ray showed no significant shoulder abnormalities.   His CPK was 2000.   ASSESSMENT:  1.  Quadriparesis.  Code 344.00.  2.  Pain in the right shoulder and left elbow.  Code unknown.  3.  Chronic obstructive pulmonary disease.  Code 496.  4.  Hypokalemia.  Code 276.8.  5.  Increased CPK.  Code 359.5.   The plan at this time is to admit the patient for evaluation of conditions  causing hypokalemia, including diarrhea and hyperthyroidism.      JML/MEDQ  D:  10/18/2004  T:  10/18/2004  Job:  [id number]   cc:   [name], M.D.  [address]  [place]  [place] [address]  Fax:  [phone]

## 2011-02-17 NOTE — H&P (Signed)
[name], [name]                ACCOUNT NO.:  [id number]   MEDICAL RECORD NO.:  [id number]          PATIENT TYPE:  EMS   LOCATION:  ED                           FACILITY:  [facility]   PHYSICIAN:  Deirdre Peer. Polite, M.D. DATE OF BIRTH:  1951-03-24   DATE OF ADMISSION:  10/18/2004  DATE OF DISCHARGE:                                HISTORY & PHYSICAL   CHIEF COMPLAINT:  Weakness.   HISTORY OF PRESENT ILLNESS:  [name] is a 60 year old male with a known  history of daily alcohol use, chronic obstructive pulmonary disease, and  occasional back pain who presents to the emergency department with  complaints of progressive weakness proximal distribution and inability to  lift his right arm x1 day. In the emergency department, the patient was  evaluated. The patient had a CT of the head without acute changes. Screening  labs show significant hypokalemia at 1.5. Also shows elevation at CK at  2,000 as well as elevated liver function studies, AST of 61, ALT of 28. In  the interim, while labs were waiting, neurology was called for concerns of  cerebrovascular accident. The patient has been evaluated by neurology, Dr.  Sandria Manly at this time. Does not feel that acute CVA if the cause and the patient  has myopathy from some other cause, i.e. hypothyroidism. [facility] has  been called to evaluate the patient. At the time of my arrival, the patient  was alert and oriented x3. No apparent distress. The patient does continue  to complain of weakness proximally in the lower legs and weakness in the  right upper extremity with some pain in the shoulder. The patient denies any  falls or any trauma. This described as progressive outside of weakness over  4 or 5 days. Denies any fever or chills. No nausea and vomiting. No diarrhea  and no constipation. Denies any new medications. The patient does describe  having some mild weight loss, about 20 pounds over 4 to 6 months. He has not  been trying to lose  weight. Says that his appetite has been not too well  lately but denies any early satiety. The patient does admit to alcohol use  approximately 3 to 4 drinks per day of vodka, which he states is probably  about 7 ounces a day and he has been doing this for years. As stated, the  patient has above lab abnormalities, again hypokalemia at 1.5, elevation of  CK of approximately 2,000 and mildly elevated liver function studies. The  examination demonstrated proximal weakness in the lower extremities and  significant weakness in the right upper extremity with some minor of pain in  the right shoulder area. Admission is deemed necessary for further  evaluation and treatment.   PAST MEDICAL HISTORY:  Significant for chronic obstructive pulmonary disease  and chronic occasional low back pain. The patient denies any diabetes.  Denies any CVA. Denies any known cancer. Denies any known thyroid problems.   ADMISSION MEDICATIONS:  Combivent, AeroBid, Spiriva. The patient denies any  diuretics. The patient does admit to Tylenol use, approximately 6 to 8  tabs  per day, which he has been doing for months. However, over the last 2 weeks,  has not since he has not had the money to buy them. The patient denies any  herbal medications. He does take a multivitamin, over-the-counter.   SOCIAL HISTORY:  Positive for tobacco 1 and 1/2 pack per day for 40 years.  The patient does admit to alcohol, 3 to 4 drinks per day of vodka, which is  about 7 ounces a day, which he has been drinking for years. Denies any  drugs.   ALLERGIES:  KEFLEX, PENICILLIN.   FAMILY HISTORY:  Noncontributory.   REVIEW OF SYSTEMS:  As stated in the HPI. Significant for rather acute onset  of weakness, particularly in the proximal distribution of the lower  extremities and significantly in the right upper extremity. Does admit to  weight loss of approximately 20 pounds. No early satiety. No fever. No  chills. No nausea or vomiting. No  diarrhea or constipation. No blood in the  stool. No blood in urine. He states that his appetite is so- so and  primarily just eats 1 meal a day.   PHYSICAL EXAMINATION:  VITAL SIGNS:  Temperature 97.1, blood pressure  154/77, pulse 90, respiratory rate 20. Saturation 94%.  HEENT:  Anicteric sclerae. No oral lesions. No nodes. No jugular venous  distention.  CHEST:  Bilateral rhonchi.  CARDIOVASCULAR:  Regular. No S3.  ABDOMEN:  Soft, nontender. No hepatosplenomegaly.  EXTREMITIES:  No edema. 2+ pulses.  NEUROLOGIC:  Cranial nerves 2-12 are intact. The patient has good shoulder  shrug. Motor, the patient has obvious weakness in the right upper extremity,  particularly at the shoulder area of approximately 1 out of 5. The patient  has good strong hand grip. The patient has 3 out of 5 flexion of the upper  arm. The patient's left arm is essentially within normal limits. The lower  extremities, the patient has good plantar and dorsi flexion. The patient has  significant weakness approximately in the hip area of approximately 3 out of  5. Deep tendon reflexes do show slow upstroke. Otherwise, no focal  abnormalities. Gait not tested secondary to weakness. The patient appears to  have sensation intact. Does not demonstrate any tremor or rigidity.  RECTAL:  Examination is deferred.   LABORATORY DATA:  CBC:  White count 6.9, hemoglobin 12.4, hematocrit 35.2,  MCV 90.6, platelet count of 200,000. Neutrophil count of 75. INR 1.0. UA:  Specific gravity 1.003. Urine pH of 7. Nitrite negative. B-met:  Sodium 130,  potassium 1.5, chloride 84, carbon dioxide 43, glucose 116, BUN 1,  creatinine 0.7, calcium 8.0, albumin 3.1, total protein 5.5. AST of 61, ALT  of 20, alkaline phosphatase of 132. Bilirubin 2.1. CK of 2,023.   Chest x-ray:  Borderline cardiomegaly and mild changes of chronic  obstructive pulmonary disease. No acute abnormalities. CT of the head within normal limits. X-ray of the right  shoulder, mild degenerative changes.   ASSESSMENT:  1.  Hypokalemia at critical of 1.5.  2.  Rhabdomyolysis.  3.  Weakness, particularly in the right upper extremity and bilateral lower      extremity proximal weakness.  4.  Weight loss of approximately 20 pounds over 6 months.  5.  Daily alcohol use approximately 7 ounces on a daily basis.  6.  Chronic obstructive pulmonary disease.  7.  Lower back pain.  8.  Chronic Tylenol use.   RECOMMENDATIONS:  1.  The patient to be admitted.  Be monitored in the step-down unit, as he      has critical potassium of undetermined etiology. Will replete the      patient's potassium and further identify cause for loss. Currently, the      patient denies any nausea, vomiting, diarrhea, or diuretic use. As the      patient consumes the amount of alcohol, will check magnesium and      phosphate level to rule out this contributing cause. To consider      checking urine chloride or to consider checking urine for diuretics.  2.  Will obtain daily CK. Will check an amylase. Will check TSH. Examination      does not reveal any signs of trauma to suggest a cause for      rhabdomyolysis. The patient denies any seizure. Denies any lipid      medication use. Also does not have any fever to suggest neurologic or      malignant syndrome. Will make further recommendations at the review of      those studies.  3.  Will followup the patient's examination after correction of      electrolytes. With the patient having significant weakness in the right      upper extremity, to consider nerve impingement. Again, the patient      without any history of trauma. Obtain PT and OT. As stated, the patient      has been seen by neurology and does not feel the patient has acute      neurologic issue at this time.  4.  Will start the patient on delirium tremor prophylaxis.  5.  Will put the patient on oxygen and nebulizers.  6.  Since the patient has significant weight loss  over 20 pounds, which      possibly could be explained by alcoholism, will recommend age      appropriate cancer screening. Will make further recommendations after      review of the above studies.      RDP/MEDQ  D:  10/18/2004  T:  10/18/2004  Job:  [id number]   cc:   [name], M.D.  [address]  [place]  [place] [address]  Fax: [phone]

## 2011-02-21 ENCOUNTER — Telehealth: Payer: Self-pay | Admitting: Internal Medicine

## 2011-02-21 ENCOUNTER — Other Ambulatory Visit: Payer: Self-pay | Admitting: Internal Medicine

## 2011-02-21 DIAGNOSIS — I10 Essential (primary) hypertension: Secondary | ICD-10-CM

## 2011-02-21 MED ORDER — AMLODIPINE-OLMESARTAN 5-40 MG PO TABS: 1.0000 | ORAL_TABLET | Freq: Every day | ORAL | Status: DC

## 2011-02-21 NOTE — Telephone Encounter (Signed)
Rx refill sent to pharmacy. 

## 2011-02-21 NOTE — Telephone Encounter (Signed)
Refill- azor 5-40mg  tablet. Take 1 tablet by mouth every day. Qty 30. Last fill 4.22.12

## 2011-03-18 ENCOUNTER — Ambulatory Visit (INDEPENDENT_AMBULATORY_CARE_PROVIDER_SITE_OTHER): Payer: Self-pay | Admitting: Internal Medicine

## 2011-03-28 ENCOUNTER — Ambulatory Visit (INDEPENDENT_AMBULATORY_CARE_PROVIDER_SITE_OTHER): Payer: PRIVATE HEALTH INSURANCE | Admitting: Internal Medicine

## 2011-03-28 DIAGNOSIS — J449 Chronic obstructive pulmonary disease, unspecified: Secondary | ICD-10-CM

## 2011-03-28 NOTE — Progress Notes (Signed)
Post spirometry done today. research

## 2011-04-06 ENCOUNTER — Ambulatory Visit: Payer: PRIVATE HEALTH INSURANCE | Admitting: Internal Medicine

## 2011-04-06 ENCOUNTER — Ambulatory Visit (INDEPENDENT_AMBULATORY_CARE_PROVIDER_SITE_OTHER): Payer: Self-pay | Admitting: Internal Medicine

## 2011-04-06 ENCOUNTER — Encounter: Payer: Self-pay | Admitting: Internal Medicine

## 2011-04-06 DIAGNOSIS — R3129 Other microscopic hematuria: Secondary | ICD-10-CM

## 2011-04-06 DIAGNOSIS — M545 Low back pain: Secondary | ICD-10-CM

## 2011-04-06 DIAGNOSIS — E785 Hyperlipidemia, unspecified: Secondary | ICD-10-CM

## 2011-04-06 MED ORDER — MORPHINE SULFATE CR 30 MG PO TB12: ORAL_TABLET | ORAL | Status: DC

## 2011-04-06 MED ORDER — AMITRIPTYLINE HCL 25 MG PO TABS: 25.0000 mg | ORAL_TABLET | Freq: Every day | ORAL | Status: DC

## 2011-04-06 NOTE — Patient Instructions (Signed)
Labs before next visit:  Lipid/lft (272.4) chem7 (v58.69)

## 2011-04-08 NOTE — Assessment & Plan Note (Signed)
Asx. Repeat ua with micro.

## 2011-04-08 NOTE — Progress Notes (Addendum)
  Subjective:    Patient ID: [name], male    DOB: 1950-11-18, 60 y.o.   MRN: [id number]  HPI Pt presents to clinic for evaluation back pain. Has known h/o chronic back pain maintained on ms contin. Tolerates narcotic without evidence of addiction or tolerance. No constipation. Having breakthrough pain prior to second dose. Remotely seen by pain clinic but has no insurance currently. State pmd discussed adjusting ms contin dosing to tid if needed. States is not a surgical candidate due to copd. Reminded of need for colorectal cancer screening and pt defers colonoscopy. Discussed tobacco use and need for cessation. Has AAA followed by vascular specialist. No other complaints.  Reviewed pmh, medications and allergies.    Review of Systems see hpi     Objective:   Physical Exam  Nursing note and vitals reviewed. Constitutional: He appears well-developed and well-nourished. No distress.  HENT:  Head: Normocephalic and atraumatic.  Eyes: Conjunctivae are normal. No scleral icterus.  Musculoskeletal: Normal range of motion. He exhibits no tenderness.  Neurological: He is alert.  Skin: Skin is warm and dry. He is not diaphoretic.  Psychiatric: He has a normal mood and affect.          Assessment & Plan:

## 2011-04-08 NOTE — Assessment & Plan Note (Addendum)
Change ms contin dosing. Discussed potential for sedation, addiction and /or tolerance. States understanding. Discussed potential pain clinic referral in the future if pain remains uncontrolled.

## 2011-04-08 NOTE — Progress Notes (Signed)
Addended by: Staci Righter on: 04/08/2011 07:23 PM   Modules accepted: Level of Service

## 2011-04-08 NOTE — Assessment & Plan Note (Deleted)
Reviewed lipid profile. Recommend low fat diet and exercise.

## 2011-04-08 NOTE — Progress Notes (Deleted)
  Subjective:    Patient ID: [name], male    DOB: [birth year], 60 y.o.   MRN: [id number]  HPI    Review of Systems     Objective:   Physical Exam        Assessment & Plan:

## 2011-05-03 ENCOUNTER — Telehealth: Payer: Self-pay | Admitting: Internal Medicine

## 2011-05-03 MED ORDER — MORPHINE SULFATE CR 30 MG PO TB12: ORAL_TABLET | ORAL | Status: DC

## 2011-05-03 NOTE — Telephone Encounter (Signed)
Call placed to patient at [phone], no answer.  A detailed voice message was left informing patient per Dr Rodena Medin instruction.  Rx printed and sent to provider for signature

## 2011-05-03 NOTE — Telephone Encounter (Signed)
Ok to print and pickup Friday. Last printed 7/5

## 2011-05-03 NOTE — Telephone Encounter (Signed)
Patient needs to pick up a written rx for morphine sulfate 30 mg

## 2011-05-05 NOTE — Telephone Encounter (Signed)
Patient presented to office to pick up Rx . Rx released to paitent

## 2011-06-01 ENCOUNTER — Telehealth: Payer: Self-pay | Admitting: Internal Medicine

## 2011-06-01 MED ORDER — MORPHINE SULFATE CR 30 MG PO TB12: ORAL_TABLET | ORAL | Status: DC

## 2011-06-01 NOTE — Telephone Encounter (Signed)
Pt picked up Rx.

## 2011-06-01 NOTE — Telephone Encounter (Signed)
Ok to print

## 2011-06-01 NOTE — Telephone Encounter (Signed)
Call placed to patient [phone], he was informed Rx was available for pick up.

## 2011-06-01 NOTE — Telephone Encounter (Signed)
Rx printed and forwarded to provider for signature.   

## 2011-06-01 NOTE — Telephone Encounter (Signed)
Patient needs to pick up an rx for morphine sulfate extended release 30 mg qty 90

## 2011-06-21 ENCOUNTER — Other Ambulatory Visit: Payer: Self-pay | Admitting: Internal Medicine

## 2011-06-26 ENCOUNTER — Ambulatory Visit (INDEPENDENT_AMBULATORY_CARE_PROVIDER_SITE_OTHER): Payer: Self-pay | Admitting: Internal Medicine

## 2011-06-26 DIAGNOSIS — J449 Chronic obstructive pulmonary disease, unspecified: Secondary | ICD-10-CM

## 2011-06-26 NOTE — Progress Notes (Signed)
Spirometry post done today research pt.

## 2011-06-28 ENCOUNTER — Ambulatory Visit (INDEPENDENT_AMBULATORY_CARE_PROVIDER_SITE_OTHER): Payer: Self-pay | Admitting: Internal Medicine

## 2011-06-28 ENCOUNTER — Encounter: Payer: Self-pay | Admitting: Internal Medicine

## 2011-06-28 DIAGNOSIS — I10 Essential (primary) hypertension: Secondary | ICD-10-CM

## 2011-06-28 DIAGNOSIS — R079 Chest pain, unspecified: Secondary | ICD-10-CM

## 2011-06-28 DIAGNOSIS — J449 Chronic obstructive pulmonary disease, unspecified: Secondary | ICD-10-CM

## 2011-06-28 DIAGNOSIS — H6590 Unspecified nonsuppurative otitis media, unspecified ear: Secondary | ICD-10-CM

## 2011-06-28 DIAGNOSIS — M545 Low back pain: Secondary | ICD-10-CM

## 2011-06-28 MED ORDER — AMITRIPTYLINE HCL 25 MG PO TABS: 25.0000 mg | ORAL_TABLET | Freq: Every day | ORAL | Status: DC

## 2011-06-28 MED ORDER — MORPHINE SULFATE CR 30 MG PO TB12: ORAL_TABLET | ORAL | Status: DC

## 2011-06-28 MED ORDER — SIMVASTATIN 20 MG PO TABS: 20.0000 mg | ORAL_TABLET | Freq: Every day | ORAL | Status: DC

## 2011-06-28 MED ORDER — MORPHINE SULFATE CR 30 MG PO TB12: ORAL_TABLET | ORAL | Status: AC

## 2011-06-28 MED ORDER — AMLODIPINE-OLMESARTAN 5-40 MG PO TABS: 1.0000 | ORAL_TABLET | Freq: Every day | ORAL | Status: DC

## 2011-06-28 MED ORDER — DOXYCYCLINE HYCLATE 100 MG PO TABS: 100.0000 mg | ORAL_TABLET | Freq: Two times a day (BID) | ORAL | Status: AC

## 2011-06-28 MED ORDER — DOXYCYCLINE HYCLATE 100 MG PO TABS: 100.0000 mg | ORAL_TABLET | Freq: Two times a day (BID) | ORAL | Status: DC

## 2011-06-28 MED ORDER — CILOSTAZOL 100 MG PO TABS: 100.0000 mg | ORAL_TABLET | Freq: Two times a day (BID) | ORAL | Status: DC

## 2011-06-28 NOTE — Assessment & Plan Note (Signed)
Stable.  Continue current medication regimen. BP: 132/72 mmHg

## 2011-06-28 NOTE — Assessment & Plan Note (Addendum)
Lungs exam w/o wheezing.  Strongly encouraged complete tobacco cessation

## 2011-06-28 NOTE — Assessment & Plan Note (Signed)
Sense of fluid in left ear x 2 wks.  Exam revealed retracted and erythematous ear drum.   tx with doxy 100 mg bid x 10 days.  Pt allergic to PCN and cephalosporins

## 2011-06-28 NOTE — Assessment & Plan Note (Addendum)
No change in symptoms.  Continue MS contin.  Pt previously agreed to and sign pain contract.
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
## 2011-06-28 NOTE — Patient Instructions (Signed)
Please call our office if your right ear symptoms do not improve or gets worse.

## 2011-06-29 LAB — DRUG SCREEN, URINE
Barbiturate Quant, Ur: NEGATIVE
Cocaine Metabolites: NEGATIVE
Creatinine,U: 107.3 mg/dL
Opiates: POSITIVE — AB
Phencyclidine (PCP): NEGATIVE
Propoxyphene: NEGATIVE

## 2011-07-03 DIAGNOSIS — J189 Pneumonia, unspecified organism: Secondary | ICD-10-CM

## 2011-07-03 HISTORY — DX: Pneumonia, unspecified organism: J18.9

## 2011-07-07 ENCOUNTER — Ambulatory Visit: Payer: Self-pay | Admitting: Internal Medicine

## 2011-07-17 LAB — CBC
HCT: 40.2
MCHC: 34.2
MCV: 89.3
Platelets: 179
WBC: 8.1

## 2011-07-17 LAB — URINALYSIS, ROUTINE W REFLEX MICROSCOPIC
Bilirubin Urine: NEGATIVE
Ketones, ur: NEGATIVE
Leukocytes, UA: NEGATIVE
Nitrite: NEGATIVE
Urobilinogen, UA: 0.2
pH: 5.5

## 2011-07-17 LAB — COMPREHENSIVE METABOLIC PANEL
Albumin: 4.1
BUN: 14
Calcium: 9.6
Chloride: 106
Creatinine, Ser: 0.84
GFR calc non Af Amer: >60
Total Bilirubin: 0.5

## 2011-07-17 LAB — POCT CARDIAC MARKERS
CKMB, poc: 1
Operator id: 4295
Troponin i, poc: <0.05

## 2011-07-17 LAB — DIFFERENTIAL
Basophils Absolute: 0.1
Lymphocytes Relative: 17
Lymphs Abs: 1.4
Monocytes Absolute: 0.4
Neutro Abs: 6.1

## 2011-07-31 ENCOUNTER — Inpatient Hospital Stay (HOSPITAL_COMMUNITY)
Admission: EM | Admit: 2011-07-31 | Discharge: 2011-08-04 | Disposition: A | Discharge status: Still In Hospital | Payer: Medicaid Other | Source: Home / Self Care | Attending: Pulmonary Disease | Admitting: Pulmonary Disease

## 2011-07-31 ENCOUNTER — Other Ambulatory Visit: Payer: Self-pay | Admitting: Pulmonary Disease

## 2011-07-31 ENCOUNTER — Emergency Department (HOSPITAL_COMMUNITY): Payer: Medicaid Other

## 2011-07-31 ENCOUNTER — Inpatient Hospital Stay (HOSPITAL_COMMUNITY): Payer: Medicaid Other

## 2011-07-31 DIAGNOSIS — J441 Chronic obstructive pulmonary disease with (acute) exacerbation: Secondary | ICD-10-CM | POA: Diagnosis present

## 2011-07-31 DIAGNOSIS — R4182 Altered mental status, unspecified: Secondary | ICD-10-CM

## 2011-07-31 DIAGNOSIS — J9600 Acute respiratory failure, unspecified whether with hypoxia or hypercapnia: Secondary | ICD-10-CM | POA: Diagnosis present

## 2011-07-31 DIAGNOSIS — E872 Acidosis, unspecified: Secondary | ICD-10-CM | POA: Diagnosis present

## 2011-07-31 DIAGNOSIS — F411 Generalized anxiety disorder: Secondary | ICD-10-CM | POA: Diagnosis not present

## 2011-07-31 DIAGNOSIS — I214 Non-ST elevation (NSTEMI) myocardial infarction: Secondary | ICD-10-CM | POA: Diagnosis not present

## 2011-07-31 DIAGNOSIS — F17200 Nicotine dependence, unspecified, uncomplicated: Secondary | ICD-10-CM | POA: Diagnosis present

## 2011-07-31 DIAGNOSIS — E7800 Pure hypercholesterolemia, unspecified: Secondary | ICD-10-CM | POA: Diagnosis present

## 2011-07-31 DIAGNOSIS — E875 Hyperkalemia: Secondary | ICD-10-CM | POA: Diagnosis present

## 2011-07-31 DIAGNOSIS — J9620 Acute and chronic respiratory failure, unspecified whether with hypoxia or hypercapnia: Secondary | ICD-10-CM

## 2011-07-31 DIAGNOSIS — M545 Low back pain: Secondary | ICD-10-CM | POA: Insufficient documentation

## 2011-07-31 DIAGNOSIS — J189 Pneumonia, unspecified organism: Secondary | ICD-10-CM

## 2011-07-31 DIAGNOSIS — Y92009 Unspecified place in unspecified non-institutional (private) residence as the place of occurrence of the external cause: Secondary | ICD-10-CM

## 2011-07-31 DIAGNOSIS — M549 Dorsalgia, unspecified: Secondary | ICD-10-CM | POA: Diagnosis present

## 2011-07-31 DIAGNOSIS — I714 Abdominal aortic aneurysm, without rupture, unspecified: Secondary | ICD-10-CM | POA: Diagnosis present

## 2011-07-31 DIAGNOSIS — J13 Pneumonia due to Streptococcus pneumoniae: Principal | ICD-10-CM | POA: Diagnosis present

## 2011-07-31 DIAGNOSIS — N179 Acute kidney failure, unspecified: Secondary | ICD-10-CM

## 2011-07-31 DIAGNOSIS — I5021 Acute systolic (congestive) heart failure: Secondary | ICD-10-CM | POA: Diagnosis not present

## 2011-07-31 DIAGNOSIS — F1020 Alcohol dependence, uncomplicated: Secondary | ICD-10-CM | POA: Diagnosis present

## 2011-07-31 DIAGNOSIS — Z7982 Long term (current) use of aspirin: Secondary | ICD-10-CM

## 2011-07-31 DIAGNOSIS — D696 Thrombocytopenia, unspecified: Secondary | ICD-10-CM | POA: Diagnosis not present

## 2011-07-31 DIAGNOSIS — I252 Old myocardial infarction: Secondary | ICD-10-CM | POA: Diagnosis not present

## 2011-07-31 DIAGNOSIS — G9340 Encephalopathy, unspecified: Secondary | ICD-10-CM | POA: Diagnosis present

## 2011-07-31 DIAGNOSIS — G8929 Other chronic pain: Secondary | ICD-10-CM | POA: Diagnosis present

## 2011-07-31 DIAGNOSIS — T400X1A Poisoning by opium, accidental (unintentional), initial encounter: Secondary | ICD-10-CM | POA: Diagnosis present

## 2011-07-31 DIAGNOSIS — K7010 Alcoholic hepatitis without ascites: Secondary | ICD-10-CM | POA: Diagnosis present

## 2011-07-31 DIAGNOSIS — T40601A Poisoning by unspecified narcotics, accidental (unintentional), initial encounter: Secondary | ICD-10-CM | POA: Diagnosis present

## 2011-07-31 DIAGNOSIS — I10 Essential (primary) hypertension: Secondary | ICD-10-CM | POA: Diagnosis present

## 2011-07-31 DIAGNOSIS — I70209 Unspecified atherosclerosis of native arteries of extremities, unspecified extremity: Secondary | ICD-10-CM | POA: Diagnosis present

## 2011-07-31 DIAGNOSIS — I501 Left ventricular failure: Secondary | ICD-10-CM | POA: Diagnosis not present

## 2011-07-31 LAB — URINALYSIS, ROUTINE W REFLEX MICROSCOPIC
Bilirubin Urine: NEGATIVE
Glucose, UA: NEGATIVE mg/dL
Hgb urine dipstick: NEGATIVE
Ketones, ur: NEGATIVE mg/dL
Leukocytes, UA: NEGATIVE
Nitrite: NEGATIVE
Protein, ur: NEGATIVE mg/dL
Specific Gravity, Urine: 1.016 (ref 1.005–1.030)
Urobilinogen, UA: 0.2 mg/dL (ref 0.0–1.0)
pH: 5 (ref 5.0–8.0)

## 2011-07-31 LAB — BLOOD GAS, ARTERIAL
Acid-base deficit: 8.2 mmol/L — ABNORMAL HIGH (ref 0.0–2.0)
Acid-base deficit: 9.6 mmol/L — ABNORMAL HIGH (ref 0.0–2.0)
Bicarbonate: 17.8 mEq/L — ABNORMAL LOW (ref 20.0–24.0)
Bicarbonate: 16.9 mEq/L — ABNORMAL LOW (ref 20.0–24.0)
Drawn by: 103701
Drawn by: 103701
FIO2: 1 %
FIO2: 0.5 %
MECHVT: 0.6 mL
O2 Content: 2 L/min
PEEP: 5 cmH2O
Patient temperature: 98.6
Patient temperature: 37
RATE: 12 resp/min
TCO2: 17.6 mmol/L (ref 0–100)
TCO2: 16.2 mmol/L (ref 0–100)
pCO2 arterial: 63.4 mmHg (ref 35.0–45.0)
pCO2 arterial: 51.2 mmHg — ABNORMAL HIGH (ref 35.0–45.0)
pH, Arterial: 7.108 — CL (ref 7.350–7.450)
pH, Arterial: 7.163 — CL (ref 7.350–7.450)
pO2, Arterial: 144 mmHg — ABNORMAL HIGH (ref 80.0–100.0)
pO2, Arterial: 79.1 mmHg — ABNORMAL LOW (ref 80.0–100.0)

## 2011-07-31 LAB — DIFFERENTIAL
Basophils Absolute: 0 10*3/uL (ref 0.0–0.1)
Basophils Relative: 0 % (ref 0–1)
Eosinophils Absolute: 0 10*3/uL (ref 0.0–0.7)
Eosinophils Relative: 0 % (ref 0–5)
Lymphocytes Relative: 10 % — ABNORMAL LOW (ref 12–46)
Lymphs Abs: 1.3 10*3/uL (ref 0.7–4.0)
Monocytes Absolute: 0.9 10*3/uL (ref 0.1–1.0)
Monocytes Relative: 7 % (ref 3–12)
Neutro Abs: 11 10*3/uL — ABNORMAL HIGH (ref 1.7–7.7)
Neutrophils Relative %: 84 % — ABNORMAL HIGH (ref 43–77)

## 2011-07-31 LAB — CBC
HCT: 33.3 % — ABNORMAL LOW (ref 39.0–52.0)
Hemoglobin: 10.9 g/dL — ABNORMAL LOW (ref 13.0–17.0)
MCH: 30.6 pg (ref 26.0–34.0)
MCHC: 32.7 g/dL (ref 30.0–36.0)
MCV: 93.5 fL (ref 78.0–100.0)
Platelets: 141 10*3/uL — ABNORMAL LOW (ref 150–400)
RBC: 3.56 MIL/uL — ABNORMAL LOW (ref 4.22–5.81)
RDW: 12.8 % (ref 11.5–15.5)
WBC: 13.2 10*3/uL — ABNORMAL HIGH (ref 4.0–10.5)

## 2011-07-31 LAB — POCT I-STAT, CHEM 8
BUN: 60 mg/dL — ABNORMAL HIGH (ref 6–23)
Calcium, Ion: 1.16 mmol/L (ref 1.12–1.32)
Chloride: 110 mEq/L (ref 96–112)
Creatinine, Ser: 3.8 mg/dL — ABNORMAL HIGH (ref 0.50–1.35)
Glucose, Bld: 113 mg/dL — ABNORMAL HIGH (ref 70–99)
HCT: 32 % — ABNORMAL LOW (ref 39.0–52.0)
Hemoglobin: 10.9 g/dL — ABNORMAL LOW (ref 13.0–17.0)
Potassium: 5.4 mEq/L — ABNORMAL HIGH (ref 3.5–5.1)
Sodium: 132 mEq/L — ABNORMAL LOW (ref 135–145)
TCO2: 17 mmol/L (ref 0–100)

## 2011-07-31 LAB — BASIC METABOLIC PANEL
Chloride: 107 mEq/L (ref 96–112)
GFR calc Af Amer: 27 mL/min — ABNORMAL LOW (ref >90)
GFR calc non Af Amer: 24 mL/min — ABNORMAL LOW (ref >90)
Potassium: 5.5 mEq/L — ABNORMAL HIGH (ref 3.5–5.1)
Sodium: 135 mEq/L (ref 135–145)

## 2011-07-31 LAB — ACETAMINOPHEN LEVEL: Acetaminophen (Tylenol), Serum: <15 ug/mL (ref 10–30)

## 2011-07-31 LAB — COMPREHENSIVE METABOLIC PANEL
ALT: 14 U/L (ref 0–53)
AST: 28 U/L (ref 0–37)
Albumin: 3.6 g/dL (ref 3.5–5.2)
Alkaline Phosphatase: 126 U/L — ABNORMAL HIGH (ref 39–117)
BUN: 61 mg/dL — ABNORMAL HIGH (ref 6–23)
CO2: 19 mEq/L (ref 19–32)
Calcium: 8.8 mg/dL (ref 8.4–10.5)
Chloride: 100 mEq/L (ref 96–112)
Creatinine, Ser: 3.46 mg/dL — ABNORMAL HIGH (ref 0.50–1.35)
GFR calc Af Amer: 20 mL/min — ABNORMAL LOW (ref >90)
GFR calc non Af Amer: 18 mL/min — ABNORMAL LOW (ref >90)
Glucose, Bld: 109 mg/dL — ABNORMAL HIGH (ref 70–99)
Potassium: 5.5 mEq/L — ABNORMAL HIGH (ref 3.5–5.1)
Sodium: 132 mEq/L — ABNORMAL LOW (ref 135–145)
Total Bilirubin: 0.3 mg/dL (ref 0.3–1.2)
Total Protein: 6.5 g/dL (ref 6.0–8.3)

## 2011-07-31 LAB — PRO B NATRIURETIC PEPTIDE: Pro B Natriuretic peptide (BNP): 1404 pg/mL — ABNORMAL HIGH (ref 0–125)

## 2011-07-31 LAB — ETHANOL: Alcohol, Ethyl (B): <11 mg/dL (ref 0–11)

## 2011-07-31 LAB — MRSA PCR SCREENING: MRSA by PCR: NEGATIVE

## 2011-07-31 LAB — TRICYCLICS SCREEN, URINE: TCA Scrn: POSITIVE — AB

## 2011-08-01 ENCOUNTER — Inpatient Hospital Stay (HOSPITAL_COMMUNITY): Payer: Medicaid Other

## 2011-08-01 DIAGNOSIS — I714 Abdominal aortic aneurysm, without rupture: Secondary | ICD-10-CM

## 2011-08-01 DIAGNOSIS — N179 Acute kidney failure, unspecified: Secondary | ICD-10-CM

## 2011-08-01 DIAGNOSIS — J9620 Acute and chronic respiratory failure, unspecified whether with hypoxia or hypercapnia: Secondary | ICD-10-CM

## 2011-08-01 DIAGNOSIS — R4182 Altered mental status, unspecified: Secondary | ICD-10-CM

## 2011-08-01 DIAGNOSIS — J189 Pneumonia, unspecified organism: Secondary | ICD-10-CM

## 2011-08-01 LAB — BASIC METABOLIC PANEL
CO2: 18 mEq/L — ABNORMAL LOW (ref 19–32)
Calcium: 8.4 mg/dL (ref 8.4–10.5)
Chloride: 111 mEq/L (ref 96–112)
Creatinine, Ser: 1.44 mg/dL — ABNORMAL HIGH (ref 0.50–1.35)
GFR calc Af Amer: 59 mL/min — ABNORMAL LOW (ref >90)
GFR calc non Af Amer: 51 mL/min — ABNORMAL LOW (ref >90)
Potassium: 4.4 mEq/L (ref 3.5–5.1)
Sodium: 139 mEq/L (ref 135–145)

## 2011-08-01 LAB — BLOOD GAS, ARTERIAL
Acid-base deficit: 11.5 mmol/L — ABNORMAL HIGH (ref 0.0–2.0)
Acid-base deficit: 11.2 mmol/L — ABNORMAL HIGH (ref 0.0–2.0)
Acid-base deficit: 9.9 mmol/L — ABNORMAL HIGH (ref 0.0–2.0)
Bicarbonate: 15.5 mEq/L — ABNORMAL LOW (ref 20.0–24.0)
Drawn by: 317871
Drawn by: 317871
Drawn by: 317871
FIO2: 1 %
FIO2: 0.5 %
FIO2: 0.5 %
MECHVT: 0.5 mL
O2 Saturation: 96 %
O2 Saturation: 98.7 %
Patient temperature: 37
Patient temperature: 37
RATE: 12 resp/min
RATE: 18 resp/min
RATE: 22 resp/min
TCO2: 16.6 mmol/L (ref 0–100)
TCO2: 15.1 mmol/L (ref 0–100)
pCO2 arterial: 51.1 mmHg — ABNORMAL HIGH (ref 35.0–45.0)
pCO2 arterial: 40.1 mmHg (ref 35.0–45.0)
pO2, Arterial: 77.2 mmHg — ABNORMAL LOW (ref 80.0–100.0)
pO2, Arterial: 116 mmHg — ABNORMAL HIGH (ref 80.0–100.0)

## 2011-08-01 LAB — CBC
Hemoglobin: 9.5 g/dL — ABNORMAL LOW (ref 13.0–17.0)
MCH: 31.5 pg (ref 26.0–34.0)
MCHC: 33.9 g/dL (ref 30.0–36.0)
MCV: 92.7 fL (ref 78.0–100.0)
Platelets: 119 10*3/uL — ABNORMAL LOW (ref 150–400)
RBC: 3.02 MIL/uL — ABNORMAL LOW (ref 4.22–5.81)

## 2011-08-01 LAB — HEPATIC FUNCTION PANEL
ALT: 16 U/L (ref 0–53)
AST: 36 U/L (ref 0–37)
Albumin: 3 g/dL — ABNORMAL LOW (ref 3.5–5.2)
Alkaline Phosphatase: 106 U/L (ref 39–117)
Bilirubin, Direct: <0.1 mg/dL (ref 0.0–0.3)
Total Bilirubin: 0.2 mg/dL — ABNORMAL LOW (ref 0.3–1.2)

## 2011-08-01 LAB — CARDIAC PANEL(CRET KIN+CKTOT+MB+TROPI)
CK, MB: 19.2 ng/mL (ref 0.3–4.0)
Relative Index: 1.1 (ref 0.0–2.5)
Troponin I: <0.3 ng/mL (ref <0.30)

## 2011-08-01 LAB — RENAL FUNCTION PANEL
CO2: 15 mEq/L — ABNORMAL LOW (ref 19–32)
Calcium: 7.8 mg/dL — ABNORMAL LOW (ref 8.4–10.5)
Creatinine, Ser: 1.81 mg/dL — ABNORMAL HIGH (ref 0.50–1.35)
Glucose, Bld: 96 mg/dL (ref 70–99)

## 2011-08-01 LAB — PROCALCITONIN: Procalcitonin: 0.26 ng/mL

## 2011-08-01 LAB — LACTIC ACID, PLASMA: Lactic Acid, Venous: 0.3 mmol/L — ABNORMAL LOW (ref 0.5–2.2)

## 2011-08-02 ENCOUNTER — Inpatient Hospital Stay (HOSPITAL_COMMUNITY): Payer: Medicaid Other

## 2011-08-02 DIAGNOSIS — R0602 Shortness of breath: Secondary | ICD-10-CM

## 2011-08-02 DIAGNOSIS — R9431 Abnormal electrocardiogram [ECG] [EKG]: Secondary | ICD-10-CM

## 2011-08-02 LAB — MAGNESIUM: Magnesium: 2.2 mg/dL (ref 1.5–2.5)

## 2011-08-02 LAB — COMPREHENSIVE METABOLIC PANEL
Albumin: 3 g/dL — ABNORMAL LOW (ref 3.5–5.2)
BUN: 32 mg/dL — ABNORMAL HIGH (ref 6–23)
Chloride: 110 mEq/L (ref 96–112)
Creatinine, Ser: 1.23 mg/dL (ref 0.50–1.35)
GFR calc Af Amer: 71 mL/min — ABNORMAL LOW (ref >90)
GFR calc non Af Amer: 61 mL/min — ABNORMAL LOW (ref >90)
Glucose, Bld: 154 mg/dL — ABNORMAL HIGH (ref 70–99)
Total Bilirubin: 0.3 mg/dL (ref 0.3–1.2)

## 2011-08-02 LAB — BLOOD GAS, ARTERIAL
Acid-base deficit: 6.1 mmol/L — ABNORMAL HIGH (ref 0.0–2.0)
Delivery systems: POSITIVE
Drawn by: 129801
Drawn by: 317871
FIO2: 0.35 %
FIO2: 0.35 %
Inspiratory PAP: 12
O2 Saturation: 96.7 %
O2 Saturation: 98.7 %
Patient temperature: 98.6
TCO2: 16 mmol/L (ref 0–100)
pCO2 arterial: 30.6 mmHg — ABNORMAL LOW (ref 35.0–45.0)
pO2, Arterial: 79.9 mmHg — ABNORMAL LOW (ref 80.0–100.0)

## 2011-08-02 LAB — CBC
HCT: 30 % — ABNORMAL LOW (ref 39.0–52.0)
Hemoglobin: 10.1 g/dL — ABNORMAL LOW (ref 13.0–17.0)
MCV: 91.2 fL (ref 78.0–100.0)
RDW: 12.8 % (ref 11.5–15.5)
WBC: 8.9 10*3/uL (ref 4.0–10.5)

## 2011-08-02 LAB — URINE CULTURE
Culture  Setup Time: 201210300358
Special Requests: NEGATIVE

## 2011-08-02 LAB — BASIC METABOLIC PANEL
CO2: 20 mEq/L (ref 19–32)
GFR calc non Af Amer: 59 mL/min — ABNORMAL LOW (ref >90)
Glucose, Bld: 138 mg/dL — ABNORMAL HIGH (ref 70–99)
Potassium: 3.7 mEq/L (ref 3.5–5.1)
Sodium: 142 mEq/L (ref 135–145)

## 2011-08-02 LAB — CARDIAC PANEL(CRET KIN+CKTOT+MB+TROPI)
CK, MB: 10.3 ng/mL (ref 0.3–4.0)
Relative Index: 1 (ref 0.0–2.5)
Relative Index: 1.5 (ref 0.0–2.5)
Total CK: 708 U/L — ABNORMAL HIGH (ref 7–232)

## 2011-08-02 NOTE — Consult Note (Signed)
NAMELONNEY, REVAK                ACCOUNT NO.:  [id number]  MEDICAL RECORD NO.:  [id number]  LOCATION:  1223                         FACILITY:  [facility]  PHYSICIAN:  Di Kindle. Edilia Bo, M.D.DATE OF BIRTH:  11/12/1950  DATE OF CONSULTATION:  08/01/2011 DATE OF DISCHARGE:                                CONSULTATION   REASON FOR CONSULTATION:  Abdominal aortic aneurysm.  Consult is from Dr. Craige Cotta.  HISTORY:  This is a 60 year old gentleman who was admitted yesterday with altered mental status.  The patient had reportedly fallen.  He also has a history of chronic opioid use.  He was found to have elevated creatinine and was felt to have evidence of acute renal insufficiency. He is currently intubated and I am unable to obtain any meaningful history from the patient.  Reportedly, however, the patient had been having some chronic back pain.  Presumably, this is why he has had problems with chronic opioid usage.  PAST MEDICAL HISTORY: 1. COPD. 2. Known abdominal aortic aneurysm. 3. Hypertension. 4. History of chronic back pain. 5. History of peripheral vascular disease. 6. History of chronic alcoholism.  FAMILY HISTORY:  Cannot be obtained.  FAMILY HISTORY:  Cannot be obtained.  REVIEW OF SYSTEMS:  Cannot not be obtained.  PHYSICAL EXAMINATION:  GENERAL:  This is a 60 year old gentleman who is in no acute distress.  He is currently sedated on the ventilator. VITAL SIGNS:  Blood pressure is 136/63, heart rate is 84, temperature is 100.6, saturation 92%.  HEENT:  Unremarkable. LUNGS:  He has good breath sounds bilaterally. CARDIOVASCULAR:  I do not detect any carotid bruits, although it is difficult to assess on the right because of a central line in the right neck.  He has a regular rate and rhythm.  He has palpable femoral pulses.  I cannot palpate pedal pulses, however, both feet are warm and well perfused.  He has no significant lower extremity swelling. ABDOMEN:  Soft,  nontender with normal-pitched bowel sounds.  His aneurysm is palpable and nontender. Musculoskeletal:  No major deformities or cyanosis. NEUROLOGIC:  He appears to be moving all 4 extremities. SKIN:  There are no ulcers or rashes.  LABORATORY DATA:  Reviewed and show a creatinine of 1.8 today, potassium is 4.8.  Hemoglobin 9.5, blood gas was removed shows a pH of 7.29, pCO2 of 32, PO2 116.  I reviewed his CT scan from yesterday, which shows by my measurement a 4.5 cm pararenal abdominal aortic aneurysm.  This had been interpreted by the radiologist as larger than that,although in the accounting for tortuosity, my measurement is a maximum diameter of 4.5 cm.  The lowest renal artery on the left originates from the proximal aneurysm.  He has severe calcific disease and also ectatic common iliac arteries bilaterally.  There is no evidence of hematoma leak. I have compared this to CT scan in January 2006.  At that time, the maximum diameter of the aneurysm was 4.0 cm.  IMPRESSION:  This patient has a known abdominal aortic aneurysm.  It is enlarged only half a centimeter over 6 years.  I do not see evidence of leak.  It is a pararenal aneurysm and  he does not appear to be a candidate for endovascular repair unless he underwent a fenestrated graft.  Regardless, however, the aneurysm is not large enough to consider repair.  In a normal-risk patient, we consider elective repair at 5.5 cm.  In addition, the aneurysm is asymptomatic with no evidence of leak.  I will arrange for a CT scan in office in 6 months and we will see him back at that time.  We will continue to follow this aneurysm. We only consider elective repair if it enlarged significantly.  Given his debilitated state, the other consideration would be to have him evaluated in [facility] for possible fenestrated graft.     Di Kindle. Edilia Bo, M.D.     CSD/MEDQ  D:  08/01/2011  T:  08/02/2011  Job:   [id number]  Electronically Signed by Waverly Ferrari M.D. on 08/02/2011 10:45:55 AM

## 2011-08-03 ENCOUNTER — Other Ambulatory Visit: Payer: Self-pay

## 2011-08-03 ENCOUNTER — Inpatient Hospital Stay (HOSPITAL_COMMUNITY): Payer: Medicaid Other

## 2011-08-03 DIAGNOSIS — I252 Old myocardial infarction: Secondary | ICD-10-CM | POA: Diagnosis not present

## 2011-08-03 DIAGNOSIS — J9620 Acute and chronic respiratory failure, unspecified whether with hypoxia or hypercapnia: Secondary | ICD-10-CM

## 2011-08-03 DIAGNOSIS — R4182 Altered mental status, unspecified: Secondary | ICD-10-CM

## 2011-08-03 DIAGNOSIS — I501 Left ventricular failure: Secondary | ICD-10-CM | POA: Diagnosis not present

## 2011-08-03 DIAGNOSIS — J189 Pneumonia, unspecified organism: Secondary | ICD-10-CM | POA: Diagnosis present

## 2011-08-03 DIAGNOSIS — I2510 Atherosclerotic heart disease of native coronary artery without angina pectoris: Secondary | ICD-10-CM

## 2011-08-03 DIAGNOSIS — J9600 Acute respiratory failure, unspecified whether with hypoxia or hypercapnia: Secondary | ICD-10-CM | POA: Diagnosis present

## 2011-08-03 DIAGNOSIS — I714 Abdominal aortic aneurysm, without rupture: Secondary | ICD-10-CM

## 2011-08-03 DIAGNOSIS — N179 Acute kidney failure, unspecified: Secondary | ICD-10-CM

## 2011-08-03 DIAGNOSIS — I219 Acute myocardial infarction, unspecified: Secondary | ICD-10-CM

## 2011-08-03 HISTORY — DX: Atherosclerotic heart disease of native coronary artery without angina pectoris: I25.10

## 2011-08-03 HISTORY — DX: Acute myocardial infarction, unspecified: I21.9

## 2011-08-03 LAB — BLOOD GAS, ARTERIAL
Acid-base deficit: 6 mmol/L — ABNORMAL HIGH (ref 0.0–2.0)
Drawn by: 340271
FIO2: 0.5 %
MECHVT: 600 mL
RATE: 22 resp/min
pCO2 arterial: 42.3 mmHg (ref 35.0–45.0)
pH, Arterial: 7.289 — ABNORMAL LOW (ref 7.350–7.450)
pO2, Arterial: 60.4 mmHg — ABNORMAL LOW (ref 80.0–100.0)

## 2011-08-03 LAB — COMPREHENSIVE METABOLIC PANEL
ALT: 18 U/L (ref 0–53)
AST: 23 U/L (ref 0–37)
Albumin: 2.9 g/dL — ABNORMAL LOW (ref 3.5–5.2)
Alkaline Phosphatase: 89 U/L (ref 39–117)
CO2: 21 mEq/L (ref 19–32)
Chloride: 113 mEq/L — ABNORMAL HIGH (ref 96–112)
GFR calc non Af Amer: 60 mL/min — ABNORMAL LOW (ref >90)
Potassium: 3.8 mEq/L (ref 3.5–5.1)
Sodium: 144 mEq/L (ref 135–145)
Total Bilirubin: 0.2 mg/dL — ABNORMAL LOW (ref 0.3–1.2)

## 2011-08-03 LAB — CARDIAC PANEL(CRET KIN+CKTOT+MB+TROPI): CK, MB: 8.6 ng/mL (ref 0.3–4.0)

## 2011-08-03 LAB — CBC
MCV: 91.7 fL (ref 78.0–100.0)
Platelets: 151 10*3/uL (ref 150–400)
RBC: 3.15 MIL/uL — ABNORMAL LOW (ref 4.22–5.81)
RDW: 13.2 % (ref 11.5–15.5)
WBC: 10 10*3/uL (ref 4.0–10.5)

## 2011-08-04 ENCOUNTER — Inpatient Hospital Stay (HOSPITAL_COMMUNITY)
Admission: EM | Admit: 2011-08-04 | Discharge: 2011-08-12 | DRG: 208 | Disposition: A | Discharge status: Home / Self Care | Payer: Medicaid Other | Source: Ambulatory Visit | Attending: Internal Medicine | Admitting: Internal Medicine

## 2011-08-04 ENCOUNTER — Inpatient Hospital Stay (HOSPITAL_COMMUNITY): Payer: Medicaid Other

## 2011-08-04 DIAGNOSIS — J449 Chronic obstructive pulmonary disease, unspecified: Secondary | ICD-10-CM | POA: Diagnosis present

## 2011-08-04 DIAGNOSIS — D696 Thrombocytopenia, unspecified: Secondary | ICD-10-CM

## 2011-08-04 DIAGNOSIS — I252 Old myocardial infarction: Secondary | ICD-10-CM | POA: Diagnosis not present

## 2011-08-04 DIAGNOSIS — M545 Low back pain: Secondary | ICD-10-CM | POA: Diagnosis present

## 2011-08-04 DIAGNOSIS — J189 Pneumonia, unspecified organism: Secondary | ICD-10-CM | POA: Diagnosis present

## 2011-08-04 DIAGNOSIS — I501 Left ventricular failure: Secondary | ICD-10-CM | POA: Diagnosis not present

## 2011-08-04 DIAGNOSIS — I214 Non-ST elevation (NSTEMI) myocardial infarction: Secondary | ICD-10-CM

## 2011-08-04 DIAGNOSIS — J441 Chronic obstructive pulmonary disease with (acute) exacerbation: Secondary | ICD-10-CM | POA: Diagnosis present

## 2011-08-04 LAB — PROCALCITONIN: Procalcitonin: <0.1 ng/mL

## 2011-08-04 LAB — BLOOD GAS, ARTERIAL
Bicarbonate: 22.1 mEq/L (ref 20.0–24.0)
MECHVT: 600 mL
PEEP: 5 cmH2O
Patient temperature: 98.6
pCO2 arterial: 32.8 mmHg — ABNORMAL LOW (ref 35.0–45.0)
pH, Arterial: 7.443 (ref 7.350–7.450)

## 2011-08-04 LAB — GLUCOSE, CAPILLARY: Glucose-Capillary: 119 mg/dL — ABNORMAL HIGH (ref 70–99)

## 2011-08-04 LAB — VANCOMYCIN, TROUGH: Vancomycin Tr: 17.2 ug/mL (ref 10.0–20.0)

## 2011-08-04 LAB — COMPREHENSIVE METABOLIC PANEL
ALT: 15 U/L (ref 0–53)
AST: 14 U/L (ref 0–37)
Albumin: 2.8 g/dL — ABNORMAL LOW (ref 3.5–5.2)
Calcium: 9 mg/dL (ref 8.4–10.5)
Creatinine, Ser: 1.48 mg/dL — ABNORMAL HIGH (ref 0.50–1.35)
Sodium: 143 mEq/L (ref 135–145)

## 2011-08-04 MED ORDER — ALBUTEROL SULFATE (5 MG/ML) 0.5% IN NEBU
2.5000 mg | INHALATION_SOLUTION | Freq: Four times a day (QID) | RESPIRATORY_TRACT | Status: DC
  Administered 2011-08-06 (×2): 2.5 mg via RESPIRATORY_TRACT
  Administered 2011-08-06: 02:00:00 via RESPIRATORY_TRACT
  Administered 2011-08-06 – 2011-08-11 (×19): 2.5 mg via RESPIRATORY_TRACT
  Filled 2011-08-04: qty 0.5
  Filled 2011-08-04 (×2): qty 20
  Filled 2011-08-04 (×3): qty 0.5
  Filled 2011-08-04: qty 20
  Filled 2011-08-04: qty 0.5
  Filled 2011-08-04 (×9): qty 20
  Filled 2011-08-04 (×2): qty 0.5
  Filled 2011-08-04: qty 20
  Filled 2011-08-04: qty 0.5
  Filled 2011-08-04 (×2): qty 20
  Filled 2011-08-04 (×2): qty 0.5
  Filled 2011-08-04 (×2): qty 20
  Filled 2011-08-04: qty 0.5
  Filled 2011-08-04: qty 20
  Filled 2011-08-04: qty 0.5
  Filled 2011-08-04 (×2): qty 20
  Filled 2011-08-04: qty 0.5
  Filled 2011-08-04 (×2): qty 20
  Filled 2011-08-04: qty 0.5
  Filled 2011-08-04: qty 20
  Filled 2011-08-04: qty 0.5
  Filled 2011-08-04: qty 20
  Filled 2011-08-04 (×2): qty 0.5
  Filled 2011-08-04 (×4): qty 20
  Filled 2011-08-04 (×3): qty 0.5
  Filled 2011-08-04 (×2): qty 20
  Filled 2011-08-04: qty 0.5
  Filled 2011-08-04: qty 20

## 2011-08-04 MED ORDER — AZTREONAM 1 G IJ SOLR
1.0000 g | Freq: Three times a day (TID) | INTRAMUSCULAR | Status: DC
  Filled 2011-08-04 (×8): qty 1

## 2011-08-04 MED ORDER — SIMVASTATIN 20 MG PO TABS
20.0000 mg | ORAL_TABLET | Freq: Every day | ORAL | Status: DC
  Administered 2011-08-07 – 2011-08-11 (×5): 20 mg via ORAL
  Filled 2011-08-04 (×9): qty 1

## 2011-08-04 MED ORDER — CILOSTAZOL 100 MG PO TABS
100.0000 mg | ORAL_TABLET | Freq: Two times a day (BID) | ORAL | Status: DC
  Filled 2011-08-04 (×5): qty 1

## 2011-08-04 MED ORDER — ASPIRIN EC 325 MG PO TBEC
325.0000 mg | DELAYED_RELEASE_TABLET | Freq: Every day | ORAL | Status: DC
  Administered 2011-08-06: 325 mg via ORAL
  Filled 2011-08-04 (×2): qty 1

## 2011-08-04 MED ORDER — IPRATROPIUM BROMIDE 0.02 % IN SOLN
0.5000 mg | Freq: Four times a day (QID) | RESPIRATORY_TRACT | Status: DC
  Administered 2011-08-06 – 2011-08-11 (×22): 0.5 mg via RESPIRATORY_TRACT
  Filled 2011-08-04 (×32): qty 2.5

## 2011-08-04 MED ORDER — VANCOMYCIN HCL IN DEXTROSE 1-5 GM/200ML-% IV SOLN
1000.0000 mg | INTRAVENOUS | Status: DC
  Filled 2011-08-04 (×2): qty 200

## 2011-08-04 MED ORDER — PANTOPRAZOLE SODIUM 40 MG PO TBEC: 40.0000 mg | DELAYED_RELEASE_TABLET | Freq: Every day | ORAL | Status: DC

## 2011-08-04 MED ORDER — SODIUM CHLORIDE 0.9 % IV SOLN
INTRAVENOUS | Status: DC
  Administered 2011-08-06: 03:00:00 via INTRAVENOUS
  Administered 2011-08-06: 20 mL via INTRAVENOUS
  Administered 2011-08-06: 04:00:00 via INTRAVENOUS

## 2011-08-04 MED ORDER — B COMPLEX-C PO TABS
1.0000 | ORAL_TABLET | Freq: Every day | ORAL | Status: DC
  Administered 2011-08-06 – 2011-08-12 (×6): 1 via ORAL
  Filled 2011-08-04 (×9): qty 1

## 2011-08-04 MED ORDER — PANTOPRAZOLE SODIUM 40 MG PO PACK
40.0000 mg | PACK | Freq: Every day | ORAL | Status: DC
  Filled 2011-08-04 (×3): qty 20

## 2011-08-04 MED ORDER — ALBUTEROL SULFATE (5 MG/ML) 0.5% IN NEBU
2.5000 mg | INHALATION_SOLUTION | RESPIRATORY_TRACT | Status: DC | PRN
  Filled 2011-08-04: qty 20

## 2011-08-04 MED ORDER — IPRATROPIUM BROMIDE 0.02 % IN SOLN: 0.5000 mg | RESPIRATORY_TRACT | Status: DC | PRN

## 2011-08-04 MED ORDER — POLYETHYLENE GLYCOL 3350 17 G PO PACK
17.0000 g | PACK | Freq: Every day | ORAL | Status: DC
  Administered 2011-08-06 – 2011-08-10 (×4): 17 g via ORAL
  Filled 2011-08-04 (×9): qty 1

## 2011-08-04 MED ORDER — MORPHINE SULFATE 2 MG/ML IJ SOLN
2.0000 mg | INTRAMUSCULAR | Status: DC | PRN
  Administered 2011-08-06 – 2011-08-07 (×5): 2 mg via INTRAVENOUS
  Filled 2011-08-04 (×4): qty 1

## 2011-08-04 MED ORDER — BIOTENE DRY MOUTH MT LIQD: 15.0000 mL | Freq: Four times a day (QID) | OROMUCOSAL | Status: DC

## 2011-08-04 MED ORDER — MORPHINE SULFATE CR 15 MG PO TB12
15.0000 mg | ORAL_TABLET | Freq: Two times a day (BID) | ORAL | Status: DC
  Administered 2011-08-06 – 2011-08-12 (×12): 15 mg via ORAL
  Filled 2011-08-04 (×8): qty 1

## 2011-08-04 MED ORDER — POTASSIUM CHLORIDE 20 MEQ/15ML (10%) PO LIQD
30.0000 meq | Freq: Two times a day (BID) | ORAL | Status: DC
  Filled 2011-08-04 (×5): qty 22.5

## 2011-08-04 MED ORDER — HYDRALAZINE HCL 25 MG PO TABS
12.5000 mg | ORAL_TABLET | Freq: Three times a day (TID) | ORAL | Status: DC
  Administered 2011-08-06 – 2011-08-07 (×3): 12.5 mg via ORAL
  Filled 2011-08-04 (×13): qty 0.5

## 2011-08-04 MED ORDER — HEPARIN SODIUM (PORCINE) 5000 UNIT/ML IJ SOLN
5000.0000 [IU] | Freq: Three times a day (TID) | INTRAMUSCULAR | Status: DC
  Administered 2011-08-06: 5000 [IU] via SUBCUTANEOUS
  Filled 2011-08-04 (×8): qty 1

## 2011-08-04 MED ORDER — AMITRIPTYLINE HCL 25 MG PO TABS
25.0000 mg | ORAL_TABLET | Freq: Every day | ORAL | Status: DC
  Administered 2011-08-06 – 2011-08-11 (×6): 25 mg via ORAL
  Filled 2011-08-04 (×10): qty 1

## 2011-08-04 MED ORDER — THERA M PLUS PO TABS
1.0000 | ORAL_TABLET | Freq: Every day | ORAL | Status: DC
  Administered 2011-08-06 – 2011-08-10 (×5): 1 via ORAL
  Filled 2011-08-04 (×9): qty 1

## 2011-08-04 MED ORDER — METHYLPREDNISOLONE SODIUM SUCC 40 MG IJ SOLR
40.0000 mg | Freq: Two times a day (BID) | INTRAMUSCULAR | Status: DC
  Filled 2011-08-04 (×6): qty 1

## 2011-08-04 MED ORDER — ALPRAZOLAM 0.25 MG PO TABS: 0.2500 mg | ORAL_TABLET | Freq: Three times a day (TID) | ORAL | Status: DC

## 2011-08-04 MED ORDER — BISOPROLOL FUMARATE 5 MG PO TABS
5.0000 mg | ORAL_TABLET | Freq: Every day | ORAL | Status: DC
  Administered 2011-08-06 – 2011-08-12 (×6): 5 mg via ORAL
  Filled 2011-08-04 (×8): qty 1

## 2011-08-04 MED ORDER — FUROSEMIDE 80 MG PO TABS
80.0000 mg | ORAL_TABLET | Freq: Every day | ORAL | Status: DC
  Filled 2011-08-04 (×2): qty 1

## 2011-08-05 ENCOUNTER — Inpatient Hospital Stay (HOSPITAL_COMMUNITY): Payer: Medicaid Other

## 2011-08-05 DIAGNOSIS — I200 Unstable angina: Secondary | ICD-10-CM

## 2011-08-05 DIAGNOSIS — J9600 Acute respiratory failure, unspecified whether with hypoxia or hypercapnia: Secondary | ICD-10-CM

## 2011-08-05 DIAGNOSIS — J441 Chronic obstructive pulmonary disease with (acute) exacerbation: Secondary | ICD-10-CM

## 2011-08-05 LAB — GLUCOSE, CAPILLARY: Glucose-Capillary: 156 mg/dL — ABNORMAL HIGH (ref 70–99)

## 2011-08-05 LAB — BASIC METABOLIC PANEL
BUN: 40 mg/dL — ABNORMAL HIGH (ref 6–23)
Calcium: 8.6 mg/dL (ref 8.4–10.5)
Chloride: 106 mEq/L (ref 96–112)
Creatinine, Ser: 1.39 mg/dL — ABNORMAL HIGH (ref 0.50–1.35)
GFR calc Af Amer: 61 mL/min — ABNORMAL LOW (ref >90)

## 2011-08-05 LAB — CBC
HCT: 26.2 % — ABNORMAL LOW (ref 39.0–52.0)
MCH: 30.8 pg (ref 26.0–34.0)
MCHC: 34 g/dL (ref 30.0–36.0)
MCV: 90.7 fL (ref 78.0–100.0)
Platelets: 129 10*3/uL — ABNORMAL LOW (ref 150–400)
RDW: 12.9 % (ref 11.5–15.5)

## 2011-08-05 LAB — CULTURE, RESPIRATORY W GRAM STAIN

## 2011-08-05 MED ORDER — FUROSEMIDE 40 MG PO TABS
40.0000 mg | ORAL_TABLET | Freq: Every day | ORAL | Status: DC
  Administered 2011-08-06 – 2011-08-07 (×2): 40 mg via ORAL
  Filled 2011-08-05 (×3): qty 1

## 2011-08-05 MED ORDER — VANCOMYCIN HCL IN DEXTROSE 1-5 GM/200ML-% IV SOLN
1000.0000 mg | INTRAVENOUS | Status: DC
  Administered 2011-08-06: 1000 mg via INTRAVENOUS
  Filled 2011-08-05 (×3): qty 200

## 2011-08-05 MED ORDER — PREDNISONE 20 MG PO TABS
40.0000 mg | ORAL_TABLET | Freq: Every day | ORAL | Status: DC
  Administered 2011-08-06 – 2011-08-08 (×3): 40 mg via ORAL
  Filled 2011-08-05 (×5): qty 2

## 2011-08-05 MED ORDER — DIGOXIN 125 MCG PO TABS
0.1250 mg | ORAL_TABLET | Freq: Every day | ORAL | Status: DC
  Administered 2011-08-06 – 2011-08-12 (×6): 0.125 mg via ORAL
  Filled 2011-08-05 (×8): qty 1

## 2011-08-05 MED ORDER — POTASSIUM CHLORIDE 20 MEQ/15ML (10%) PO LIQD
20.0000 meq | Freq: Every day | ORAL | Status: DC
  Administered 2011-08-06 – 2011-08-07 (×2): 20 meq via ORAL
  Filled 2011-08-05 (×2): qty 15

## 2011-08-05 MED ORDER — PANTOPRAZOLE SODIUM 40 MG PO TBEC
40.0000 mg | DELAYED_RELEASE_TABLET | Freq: Every day | ORAL | Status: DC
  Administered 2011-08-06 – 2011-08-12 (×6): 40 mg via ORAL
  Filled 2011-08-05 (×4): qty 1

## 2011-08-05 MED ORDER — POTASSIUM CHLORIDE 20 MEQ/15ML (10%) PO LIQD
20.0000 meq | Freq: Two times a day (BID) | ORAL | Status: DC
Start: 2011-08-05 — End: 2011-08-05

## 2011-08-05 NOTE — Consult Note (Signed)
NAMENATANIEL, GASPER                ACCOUNT NO.:  [id number]  MEDICAL RECORD NO.:  [id number]  LOCATION:  1223                         FACILITY:  [facility]  PHYSICIAN:  Noralyn Pick. Eden Emms, MD, FACCDATE OF BIRTH:  Jan 15, 1951  DATE OF CONSULTATION: DATE OF DISCHARGE:                                CONSULTATION   Mr. Zarcone is an unfortunate 60 year old, patient of Dr. Jens Som and Dr. Excell Seltzer.  He was admitted to the hospital at [facility] on October 29th for change in mental status and respiratory distress.  He sees Dr. Shan Levans on a regular basis.  He still smokes and has significant COPD.  His chest x-ray showed low lung volumes with pulmonary venous congestion.  The patient required initial intubation and subsequently has been extubated.  He is on BiPAP.  He denies any chest pain.  He has significant vascular disease.  He is followed by Dr. Edilia Bo for 5.2 cm aneurysm.  This was evaluated on this admission and Dr. Edilia Bo felt that it was currently stable.  In the long run, he may need a fenestrated graft at [facility], but he is to have a followup CT scan with Dr. Edilia Bo in 6 months.  When he last saw Dr. Jens Som, there was a question of needing a heart cath.  He has known vascular disease with an echo in June 2011, showing an EF of 45%.  The patient initially declined heart cath due to lack of insurance.  His wife indicates that he still lacks insurance, having not worked since I believe 2009.  The patient had a 2D echocardiogram today, results are not currently available.  His nurse had noted increased PVCs in bigeminy.  The patient denies chest pain and feels that his breathing is getting better.  His 10 point review of systems is remarkable for continued claudication.  He has seen Dr. Excell Seltzer for this in the past.  His ABIs were in the 0.7 range and he has bilateral SFA occlusions with collaterals and reconstitution distally.  CURRENT MEDICATIONS:  In the hospital  include: 1. Ventolin inhaler. 2. Elavil 25 mg p.o. q.h.s. 3. Norvasc 5 mg a day. 4. Aspirin a day. 5. Pletal 100 mg b.i.d. 6. Heparin. 7. Normodyne was initially given as a drip. 8. Morphine and Ativan p.r.n. 9. He is on prednisone at 40 mg a day. 10.He is being covered with antibiotics, which include vancomycin.  CURRENT ALLERGIES:  Include: 1. PENICILLIN. 2. CEPHALOSPORIN. 3. HYDROMORPHONE. 4. AVELOX. 5. He has had anaphylaxis with KEFLEX.  He is married.  His wife is with him.  She seems supportive.  He has not worked since 2009.  There is a previous history of alcohol use.  He continues to smoke.  He is, otherwise, and sedentary and appears chronically ill.  PHYSICAL EXAMINATION:  GENERAL:  His current exam is remarkable for elderly disheveled male, who looks much older than his stated age.  He is in sinus rhythm with frequent PVCs. VITAL SIGNS:  His blood pressure is 110/70, respirations are 22, 97% sat on 2 L with BiPAP. HEENT:  Unremarkable.  He has bilateral carotid bruits.  JVP is minimally elevated. LUNGS:  Have rhonchi  and wheezes. HEART:  There is an S1, S2.  Normal heart sounds.  PMI is mildly enlarged. ABDOMEN:  Protuberant with a palpable AAA.  He has decreased peripheral pulses bilaterally.  There is no edema. NEURO:  Currently is nonfocal.  He is alert and oriented.  SKIN:  Warm and dry.  No muscular weakness.  EKGs show sinus rhythm with bigeminy.  No acute ST-elevation changes.  LABORATORY DATA:  His lab work is remarkable for potassium 3.7, BUN 32, creatinine 1.26.  Cardiac panel showed CPK at 1040, MB 10.9.  Relative index only 1 with a troponin of 1.06.  White count 8.9, hematocrit 30. ABG; 7.34, 79 with pCO2 of 30.  IMPRESSION: 1. The patient admitted with mental status changes and respiratory     distress, currently improving.  History of decreased left     ventricular function, likely mild volume overload, 2D     echocardiogram is pending to  reassess ejection and fraction.     Continue current therapy with antibiotics and nebulizers.  Continue     low-dose diuretic for diuresis.  Continue BiPAP.  No further     aggressive cardiac workup until mental status and pulmonary status     is improved. 2. History of decreased ejection fraction at 45%.  Vascular disease     with likely coronary artery disease, previously recommended for     cath, previous recommendation for cath declined due to lack of     insurance.  No evidence of acute coronary syndrome with CPKs being     elevated, but very low MB and no acute EKG changes.  Further     recommendations regarding right and left heart cath, we will have     to wait until his pulmonary and mental status improves.  He     continues to lack insurance and I am not sure what he will want to     do in this regard as well.  Clearly, he would benefit from a right     and left heart cath once he is stabilized to further define his     anatomy. 3. History of peripheral vascular disease and abdominal aortic     aneurysm, followed by both Dr. Excell Seltzer and Dr. Edilia Bo.  Has     claudication at some point, would benefit by bypass to the lower     extremities; however, he would not appear to be a great vascular     surgery candidate and Dr. Edilia Bo has already indicated that he may     look into a fenestrated graft for is abdominal aortic aneurysm at     [facility]. For the time being, we will continue Pletal.  If the patient's premature ventricular contractions continue, could possibly use amiodarone to quiet them down, however, we will see how he is doing since he is currently not having any significant chest pain and await the results of his echo regarding ejection fraction.     Noralyn Pick. Eden Emms, MD, [facility]     PCN/MEDQ  D:  08/02/2011  T:  08/03/2011  Job:  [id number]  Electronically Signed by Charlton Haws MD [facility] on 08/05/2011 08:51:56 PM

## 2011-08-06 DIAGNOSIS — I200 Unstable angina: Secondary | ICD-10-CM

## 2011-08-06 DIAGNOSIS — D696 Thrombocytopenia, unspecified: Secondary | ICD-10-CM | POA: Insufficient documentation

## 2011-08-06 DIAGNOSIS — J9600 Acute respiratory failure, unspecified whether with hypoxia or hypercapnia: Secondary | ICD-10-CM

## 2011-08-06 DIAGNOSIS — J441 Chronic obstructive pulmonary disease with (acute) exacerbation: Secondary | ICD-10-CM

## 2011-08-06 LAB — BASIC METABOLIC PANEL
BUN: 31 mg/dL — ABNORMAL HIGH (ref 6–23)
Chloride: 105 mEq/L (ref 96–112)
Creatinine, Ser: 1.14 mg/dL (ref 0.50–1.35)
GFR calc Af Amer: 78 mL/min — ABNORMAL LOW (ref >90)
GFR calc non Af Amer: 67 mL/min — ABNORMAL LOW (ref >90)
Potassium: 3.4 mEq/L — ABNORMAL LOW (ref 3.5–5.1)

## 2011-08-06 LAB — CBC
HCT: 29.2 % — ABNORMAL LOW (ref 39.0–52.0)
HCT: 30.7 % — ABNORMAL LOW (ref 39.0–52.0)
Hemoglobin: 10.3 g/dL — ABNORMAL LOW (ref 13.0–17.0)
MCHC: 33.9 g/dL (ref 30.0–36.0)
Platelets: 29 10*3/uL — CL (ref 150–400)
RBC: 3.41 MIL/uL — ABNORMAL LOW (ref 4.22–5.81)
RDW: 12.6 % (ref 11.5–15.5)
WBC: 7.9 10*3/uL (ref 4.0–10.5)

## 2011-08-06 LAB — GLUCOSE, CAPILLARY: Glucose-Capillary: 190 mg/dL — ABNORMAL HIGH (ref 70–99)

## 2011-08-06 MED ORDER — SODIUM CHLORIDE 0.9 % IJ SOLN
3.0000 mL | INTRAMUSCULAR | Status: DC | PRN
  Administered 2011-08-09: 3 mL via INTRAVENOUS

## 2011-08-06 MED ORDER — ALPRAZOLAM 0.25 MG PO TABS: 0.2500 mg | ORAL_TABLET | Freq: Three times a day (TID) | ORAL | Status: DC | PRN

## 2011-08-06 MED ORDER — SODIUM CHLORIDE 0.9 % IV SOLN
250.0000 mL | INTRAVENOUS | Status: DC
  Administered 2011-08-06: 250 mL via INTRAVENOUS

## 2011-08-06 MED ORDER — SODIUM CHLORIDE 0.9 % IJ SOLN
3.0000 mL | Freq: Two times a day (BID) | INTRAMUSCULAR | Status: DC
  Administered 2011-08-06 – 2011-08-10 (×7): 3 mL via INTRAVENOUS

## 2011-08-06 MED ORDER — SODIUM CHLORIDE 0.9 % IV SOLN
INTRAVENOUS | Status: DC
  Administered 2011-08-07: 20 mL via INTRAVENOUS

## 2011-08-06 MED ORDER — ALBUTEROL SULFATE (5 MG/ML) 0.5% IN NEBU
INHALATION_SOLUTION | RESPIRATORY_TRACT | Status: AC
  Filled 2011-08-06: qty 0.5

## 2011-08-06 MED ORDER — ASPIRIN 81 MG PO CHEW: 324.0000 mg | CHEWABLE_TABLET | ORAL | Status: DC

## 2011-08-06 NOTE — Progress Notes (Signed)
  60yo male smoker admitted 11/2 from [facility] fro a planned cardiac cath. He was originally admitted 10/29 forAMS r/t morphine use and possible ETOH detox. He was intubated x2, suffered ARF(resolved) and on 10/31 had NSTEMI. His EF dropped from 45% down to 25%. PMH stable AAA, COPD, alcoholic hepatitis  RIJ 10/31 >> ETT 10/29 >> 10/30, 11/1 >> 11/2  Cx sputum 10/29 >> strep pneumniae Azactam 10/29 >>11/3 vanc 10/29 >> (note allergies)   renal vasc (dickson) cards (cooper)  HPI/Events of Note afebrile, no dyspnea  Past History NEURO-migraines, left eye injury with right visual field blindness CVASC-hypertension requiring treatment; peripheral vascular disease; index PULM-COPD - moderate GI-Cirrhosis: encephalopathy; alcoholic hepatitis RHEUM-chronic back pain  Filed Vitals:   08/06/11 0600 08/06/11 0700 08/06/11 0713 08/06/11 0759  BP: 130/54 136/59    Pulse: 60 59 61   Temp:    98 F (36.7 C)  TempSrc:    Oral  Resp: 18 17 17    Height:      Weight:  63.9 kg (140 lb 14 oz)    SpO2: 98% 99% 98%      Physical Exam  NEURO-Mental Status: agitated at times; GCS: M-6, V-3, E-4; awake and alert, weaning sedation. HEAD/NECK-Pupils: equal, react to light; Neck: JVD present, Mouth/Pharynx: Dry mucosa PULM-prolonged exhalation; scattered rhonchi; bibasilar rales CVASC-S1 normal; S2 normal; systolic murmur; Pulses: normal GI-no pain on palpation; Bowel Sounds: decreased GU-foley catheter; External Genitalia: normal EXTREM-no edema;Perfusion: adequate NEURO-GCS: M-6, V-5, E-4  CBC    Component Value Date/Time   WBC 7.9 08/06/2011 0301   RBC 3.25* 08/06/2011 0301   HGB 9.9* 08/06/2011 0301   HCT 29.2* 08/06/2011 0301   PLT 29* 08/06/2011 0301   MCV 89.8 08/06/2011 0301   MCH 30.5 08/06/2011 0301   MCHC 33.9 08/06/2011 0301   RDW 12.6 08/06/2011 0301   LYMPHSABS 1.3 07/31/2011 1200   MONOABS 0.9 07/31/2011 1200   EOSABS 0.0 07/31/2011 1200   BASOSABS 0.0 07/31/2011 1200     BMET    Component Value Date/Time   NA 142 08/06/2011 0301   K 3.4* 08/06/2011 0301   CL 105 08/06/2011 0301   CO2 28 08/06/2011 0301   GLUCOSE 108* 08/06/2011 0301   BUN 31* 08/06/2011 0301   CREATININE 1.14 08/06/2011 0301   CALCIUM 8.8 08/06/2011 0301   GFRNONAA 67* 08/06/2011 0301   GFRAA 78* 08/06/2011 0301    Diagnoses PULM: ACUTE RESPIRATORY FAILURE acute COPD exacerbation Taper steroids - over 2 weeks after cath  NEURO: encephalopathy - resolved need to reassess pain meds  RENAL: acute renal failure lowered lasix Replete K  ID: Pneumococal pneumonia dc'd azactam Ct  vanc since allergic to PCN, quinolones  CVASC: acute coronary syndrome-acute myocardial infarction (no ST elevation) congestive heart failure cath planned monday Rpt CT for AAA in 6 mnths Hold ACE -  Thrombocytopenia - hold sq heparin HIT panel  OK to transfer to tele Follow-up consultation; Time devoted to patient care services described in this note = 15 minutes face-to-face / 30 minutes total evaluation tim

## 2011-08-06 NOTE — Progress Notes (Signed)
Subjective:  No SSCP.  Dyspnea improved.  Discussed need for heart cath. Dr. Excell Seltzer follows his PVD and will tentatively schedule for him if low platelet count is an error.    Objective:  Vital Signs in the last 24 hours: Temp:  [97.9 F (36.6 C)-98 F (36.7 C)] 98 F (36.7 C) (11/04 0759) Pulse Rate:  [58-83] 61  (11/04 0713) Resp:  [14-23] 17  (11/04 0713) BP: (76-140)/(54-74) 136/59 mmHg (11/04 0700) SpO2:  [94 %-99 %] 98 % (11/04 0713) Weight:  [140 lb 14 oz (63.9 kg)-146 lb 9.7 oz (66.5 kg)] 140 lb 14 oz (63.9 kg) (11/04 0700)  Intake/Output from previous day: 11/03 0701 - 11/04 0700 In: 140 [I.V.:140] Out: 4585 [Urine:4585] Intake/Output from this shift:    Physical Exam: General appearance: alert Neck: no adenopathy, no carotid bruit, no JVD, supple, symmetrical, trachea midline, thyroid not enlarged, symmetric, no tenderness/mass/nodules and Left carotid bruit Lungs: clear to auscultation bilaterally Heart: regular rate and rhythm, S1, S2 normal, no murmur, click, rub or gallop Abdomen: soft, non-tender; bowel sounds normal; no masses,  no organomegaly Extremities: extremities normal, atraumatic, no cyanosis or edema Pulses: 2+ and symmetric Decreased pedal pulses.  Left femoral bruit but both femorals are plus 2 Neurologic: Grossly normal  Lab Results:  Basename 08/06/11 0301 08/05/11 0350  WBC 7.9 7.7  HGB 9.9* 8.9*  PLT 29* 129*    Basename 08/06/11 0301 08/05/11 0350  NA 142 140  K 3.4* 4.0  CL 105 106  CO2 28 24  GLUCOSE 108* 159*  BUN 31* 40*  CREATININE 1.14 1.39*   No results found for this basename: TROPONINI:2,CK,MB:2 in the last 72 hours Hepatic Function Panel  Basename 08/04/11 0400  PROT 5.5*  ALBUMIN 2.8*  AST 14  ALT 15  ALKPHOS 85  BILITOT 0.3  BILIDIR --  IBILI --      . albuterol  2.5 mg Nebulization Q6H  . amitriptyline  25 mg Oral QHS  . aspirin EC  325 mg Oral Daily  . B-complex with vitamin C  1 tablet Oral Daily  .  bisoprolol  5 mg Oral Daily  . cilostazol  100 mg Oral BID AC  . digoxin  0.125 mg Oral Daily  . furosemide  40 mg Oral Daily  . hydrALAZINE  12.5 mg Oral TID  . ipratropium  0.5 mg Nebulization Q6H  . morphine  15 mg Oral Q12H  . multivitamins ther. w/minerals  1 tablet Oral Daily  . pantoprazole  40 mg Oral Daily  . polyethylene glycol  17 g Oral Daily  . potassium chloride  20 mEq Oral Daily  . predniSONE  40 mg Oral QAC breakfast  . simvastatin  20 mg Oral QHS  . vancomycin  1,000 mg Intravenous Q24H  . DISCONTD: ALPRAZolam  0.25 mg Oral Q8H  . DISCONTD: antiseptic oral rinse  15 mL Mouth Rinse QID  . DISCONTD: aztreonam  1 g Intravenous Q8H  . DISCONTD: furosemide  80 mg Oral Daily  . DISCONTD: heparin subcutaneous  5,000 Units Subcutaneous Q8H  . DISCONTD: methylPREDNISolone (SOLU-MEDROL) injection  40 mg Intravenous Q12H  . DISCONTD: pantoprazole sodium  40 mg Per Tube Q1200  . DISCONTD: potassium chloride  20 mEq Oral BID  . DISCONTD: potassium chloride  30 mEq Oral BID  . DISCONTD: vancomycin  1,000 mg Intravenous Q24H      Cardiac Studies:  Assessment/Plan:  Active Problems:  COPD:  Improved.  Extubated. Continue nebs  Pulmonary edema with congestive heart failure:  Echo 08/02/11  EF 25%  In 2011 was 45%.  Continue diuretics.  Right heart                                                                                          cath. With angiography  NSTEMI (non-ST elevation myocardial infarction)  Discussed cath with patient willing to proceed.  Indication is known PVD                                                                                         with decreased EF and SEMI.  Will need to cancel cath if PLT count is real.  Thrombocytopenia:   Recheck this am.  Stop all heparin and check HIT panel.  Discussed with Dr Janeann Forehand [facility] 08/06/2011, 8:05 AM

## 2011-08-06 NOTE — Significant Event (Signed)
CRITICAL VALUE ALERT  Critical value received:  Platelets=29  Date of notification:  08/06/2011  Time of notification: 0636  Critical value read back: yes  Nurse who received alert:  Berdine Dance  MD notified (1st page):  Vassie Loll (on unit)  Time of first page:  [phone]  MD notified (2nd page):  Time of second page:  Responding MD:  Vassie Loll  Time MD responded:  [phone]

## 2011-08-07 ENCOUNTER — Encounter (HOSPITAL_COMMUNITY)
Admission: EM | Disposition: A | Discharge status: Home / Self Care | Payer: Self-pay | Source: Ambulatory Visit | Attending: Pulmonary Disease

## 2011-08-07 ENCOUNTER — Inpatient Hospital Stay (HOSPITAL_COMMUNITY): Payer: Medicaid Other

## 2011-08-07 ENCOUNTER — Encounter (HOSPITAL_COMMUNITY): Payer: Self-pay | Admitting: Physician Assistant

## 2011-08-07 ENCOUNTER — Other Ambulatory Visit: Payer: Self-pay | Admitting: Physician Assistant

## 2011-08-07 DIAGNOSIS — D696 Thrombocytopenia, unspecified: Secondary | ICD-10-CM

## 2011-08-07 LAB — BASIC METABOLIC PANEL
BUN: 23 mg/dL (ref 6–23)
Chloride: 105 mEq/L (ref 96–112)
GFR calc Af Amer: 84 mL/min — ABNORMAL LOW (ref >90)
Glucose, Bld: 109 mg/dL — ABNORMAL HIGH (ref 70–99)
Potassium: 3.3 mEq/L — ABNORMAL LOW (ref 3.5–5.1)
Sodium: 142 mEq/L (ref 135–145)

## 2011-08-07 LAB — CBC
HCT: 32.1 % — ABNORMAL LOW (ref 39.0–52.0)
Hemoglobin: 10.8 g/dL — ABNORMAL LOW (ref 13.0–17.0)
WBC: 7.8 10*3/uL (ref 4.0–10.5)

## 2011-08-07 LAB — CULTURE, BLOOD (ROUTINE X 2): Culture: NO GROWTH

## 2011-08-07 SURGERY — LEFT AND RIGHT HEART CATHETERIZATION WITH CORONARY ANGIOGRAM
Anesthesia: LOCAL

## 2011-08-07 MED ORDER — POTASSIUM CHLORIDE CRYS ER 20 MEQ PO TBCR
40.0000 meq | EXTENDED_RELEASE_TABLET | Freq: Once | ORAL | Status: AC
  Administered 2011-08-07: 40 meq via ORAL
  Filled 2011-08-07: qty 2

## 2011-08-07 MED ORDER — DEXTROSE 5 % IV SOLN
500.0000 mg | INTRAVENOUS | Status: DC
  Administered 2011-08-07: 500 mg via INTRAVENOUS
  Filled 2011-08-07: qty 500

## 2011-08-07 MED ORDER — ISOSORBIDE MONONITRATE ER 30 MG PO TB24
30.0000 mg | ORAL_TABLET | Freq: Every day | ORAL | Status: DC
  Administered 2011-08-07: 30 mg via ORAL
  Filled 2011-08-07 (×2): qty 1

## 2011-08-07 NOTE — Progress Notes (Signed)
   60yo male smoker admitted 11/2 from [facility] fro a planned cardiac cath. He was originally admitted 10/29 forAMS r/t morphine use and possible ETOH detox. He was intubated x2, suffered ARF(resolved) and on 10/31 had NSTEMI. His EF dropped from 45% down to 25%. PMH stable AAA, COPD, alcoholic hepatitis  RIJ 10/31 >> ETT 10/29 >> 10/30, 11/1 >> 11/2  Cx sputum 10/29 >> strep pneumniae Azactam 10/29 >>11/3 vanc 10/29 >> (note allergies)   renal vasc (dickson) cards (cooper) Heme (murinson)  HPI/Events of Note afebrile, no dyspnea  Past History NEURO-migraines, left eye injury with right visual field blindness CVASC-hypertension requiring treatment; peripheral vascular disease; index PULM-COPD - moderate GI-Cirrhosis: encephalopathy; alcoholic hepatitis RHEUM-chronic back pain  Filed Vitals:   08/07/11 0810 08/07/11 1339 08/07/11 1440 08/07/11 1540  BP:  86/45 88/48 96/52   Pulse:  64    Temp:  98.1 F (36.7 C)    TempSrc:  Oral    Resp:  18    Height:      Weight:      SpO2: 99% 96%       Physical Exam  NEURO-Mental Status: agitated at times; GCS: M-6, V-3, E-4; awake and alert, weaning sedation. HEAD/NECK-Pupils: equal, react to light; Neck: JVD present, Mouth/Pharynx: Dry mucosa PULM-prolonged exhalation;distant bs CVASC-S1 normal; S2 normal; systolic murmur; Pulses: normal GI-no pain on palpation; Bowel Sounds: decreased GU-foley catheter; External Genitalia: normal EXTREM-no edema;Perfusion: adequate CBC    Component Value Date/Time   WBC 7.8 08/07/2011 0921   RBC 3.51* 08/07/2011 0921   HGB 10.8* 08/07/2011 0921   HCT 32.1* 08/07/2011 0921   PLT <5* 08/07/2011 0921   MCV 91.5 08/07/2011 0921   MCH 30.8 08/07/2011 0921   MCHC 33.6 08/07/2011 0921   RDW 13.0 08/07/2011 0921   LYMPHSABS 1.3 07/31/2011 1200   MONOABS 0.9 07/31/2011 1200   EOSABS 0.0 07/31/2011 1200   BASOSABS 0.0 07/31/2011 1200    BMET    Component Value Date/Time   NA 142 08/07/2011 0921   K  3.3* 08/07/2011 0921   CL 105 08/07/2011 0921   CO2 28 08/07/2011 0921   GLUCOSE 109* 08/07/2011 0921   BUN 23 08/07/2011 0921   CREATININE 1.07 08/07/2011 0921   CALCIUM 8.8 08/07/2011 0921   GFRNONAA 72* 08/07/2011 0921   GFRAA 84* 08/07/2011 0921    Diagnoses PULM: ACUTE RESPIRATORY FAILURE acute COPD exacerbation Taper steroids - over 2 weeks  NEURO: encephalopathy - resolved need to reassess pain meds  RENAL: acute renal failure Hold  lasix   ID: Pneumococal pneumonia dc'd azactam Ct  vanc since allergic to PCN, quinolones Ask ID for input on what PO med we can switch this pt over   CVASC: acute coronary syndrome-acute myocardial infarction (no ST elevation) congestive heart failure cath planned monday Rpt CT for AAA in 6 mnths Hold ACE -  Thrombocytopenia - hold sq heparin HIT panel pending Heme consult called Dr Arline Asp

## 2011-08-07 NOTE — Progress Notes (Signed)
eLink Physician-Brief Progress Note Patient Name: [name] DOB: [birth year] MRN: [id number]  Date of Service  08/07/2011   HPI/Events of Note   Got called by Dr Arline Asp. He feels vanc is cause of low platelet. Patient has strep pneumo. Numerous allergies  eICU Interventions  Dc vanc Start IV azithro - bedside md to decide on 08/08/11 if azithro still needed      [facility] 08/07/2011, 7:21 PM
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
## 2011-08-07 NOTE — Progress Notes (Signed)
  Hematology consultation  Patient seen and examined.  Chart and peripheral blood smear reviewed.  History as recorded by Marlowe Kays, PA.  Peripheral smear today shows only a couple of platelets which are normal in size.  The rest of the smear is unremarkable--no myeloid or erythroid precursors , no dyspoietic changes and no RBC fragmentation.  Clinical history of rapid isolated drop in platelet count and the smear strongly point to immune thrombocytopenia.  The difficult issue here is whether this is drug induced,ie heparin or vancomycin vs ITP unrelated to meds.  From the pharmacy list, it appears that the pt was receiving heparin in prophylactic doses for only a couple of days from 11/2 until 11/4.  Pt denies any other recent exposure to heparin.  With that history and the abrupt/severe degree of thrombocytopenia (<5k), I am doubtful that we are dealing with HIT.  Pt has been on steroids and has no prior history of low platelets, thus I doubt ITP.  I am more concerned about the Vancomycin which apparently was started on 11/2.  Pt is allergic to penicillin and cephalosporins and is being treated for strep pneumonia.  Despite the severe thrombocytopenia, pt is without obvious bleeding.  He has lots of purpura, apparently chronic.   Impression:  Immune thrombocytopenia---suspect vancomycin etiology, but at this point cannot r/o HIT or ITP  Suggest: 1)  Stop Vancomycin,  Consult ID for alternative in view of pt's allergy history.                 2)  Awaiting results of HIT panel.  If positive, pt will require direct thrombin inhibitor.  Would not start this now.                 3)  Would hold platelet tx for now, would give tx for bleeding.                 4)  Monitor daily CBC, check stools for occult blood, DIC panel, LDH                 5)  Bleeding precautions--no IM injections,    Samul Dada, MD

## 2011-08-07 NOTE — Progress Notes (Signed)
CRITICAL VALUE ALERT  Critical value received: platelets <5   Date of notification:  08/07/2011  Time of notification:  1020  Critical value read back:yes  Nurse who received alert:  Kelby Aline  MD notified (1st page):  Theodore Demark PA, Dr. Vassie Loll   Time of first page:  1024  MD notified (2nd page):  Time of second page:  Responding MD:  Theodore Demark PA, Dr. Vassie Loll  Time MD responded:  1029, 1040

## 2011-08-07 NOTE — Progress Notes (Signed)
UR Completed.  [name] 08/07/2011  

## 2011-08-07 NOTE — Progress Notes (Signed)
Notified Dr.Alva pt BP 88/48, asymptomatic. Started on imdur this am.  Stated Dr. Delford Field would be by to see pt. Emelda Brothers [place]

## 2011-08-07 NOTE — Progress Notes (Signed)
Subjective: No Chest pain or SOB   Physical Exam:   Blood pressure 136/79, pulse 68, temperature 98.6 F (37 C), temperature source Oral, resp. rate 18, height 5\' 7"  (1.702 m), weight 152 lb 14.4 oz (69.355 kg), SpO2 96.00%. Temp:  [97.7 F (36.5 C)-98.6 F (37 C)] 98.6 F (37 C) (11/05 0612) Pulse Rate:  [56-79] 68  (11/05 0612) Resp:  [14-21] 18  (11/05 0612) BP: (119-152)/(54-83) 136/79 mmHg (11/05 0612) SpO2:  [94 %-99 %] 96 % (11/05 0612) Weight:  [152 lb 14.4 oz (69.355 kg)] 152 lb 14.4 oz (69.355 kg) (11/04 1300) Weight change: 6 lb 4.7 oz (2.855 kg) I/O 11/04 0701 - 11/05 0700 In: 820 [P.O.:720; I.V.:100] Out: 1801 [Urine:1800; Stool:1]  HEENT-normal/normal eyelids Neck supple chest - Diminished breath sounds throughout CV - RRR/normal S1 and S2; no murmurs, rubs or gallops Abdomen -NT/ND, no HSM, no mass, + bowel sounds, no bruit 2+ femoral pulses, no bruits Ext-no edema, chords, 2+ DP Neuro-grossly nonfocal  Results for orders placed during the hospital encounter of 08/04/11 (from the past 48 hour(s))  GLUCOSE, CAPILLARY     Status: Abnormal   Collection Time   08/06/11 12:11 AM      Component Value Range Comment   Glucose-Capillary 190 (*) 70 - 99 (mg/dL)   CBC     Status: Abnormal   Collection Time   08/06/11  3:01 AM      Component Value Range Comment   WBC 7.9  4.0 - 10.5 (K/uL)    RBC 3.25 (*) 4.22 - 5.81 (MIL/uL)    Hemoglobin 9.9 (*) 13.0 - 17.0 (g/dL)    HCT 45.4 (*) 09.8 - 52.0 (%)    MCV 89.8  78.0 - 100.0 (fL)    MCH 30.5  26.0 - 34.0 (pg)    MCHC 33.9  30.0 - 36.0 (g/dL)    RDW 11.9  14.7 - 82.9 (%)    Platelets 29 (*) 150 - 400 (K/uL)   BASIC METABOLIC PANEL     Status: Abnormal   Collection Time   08/06/11  3:01 AM      Component Value Range Comment   Sodium 142  135 - 145 (mEq/L)    Potassium 3.4 (*) 3.5 - 5.1 (mEq/L)    Chloride 105  96 - 112 (mEq/L)    CO2 28  19 - 32 (mEq/L)    Glucose, Bld 108 (*) 70 - 99 (mg/dL)    BUN 31 (*) 6 -  23 (mg/dL)    Creatinine, Ser 5.62  0.50 - 1.35 (mg/dL)    Calcium 8.8  8.4 - 10.5 (mg/dL)    GFR calc non Af Amer 67 (*) >90 (mL/min)    GFR calc Af Amer 78 (*) >90 (mL/min)   GLUCOSE, CAPILLARY     Status: Abnormal   Collection Time   08/06/11  4:43 AM      Component Value Range Comment   Glucose-Capillary 122 (*) 70 - 99 (mg/dL)   CBC     Status: Abnormal   Collection Time   08/06/11  8:30 AM      Component Value Range Comment   WBC 11.4 (*) 4.0 - 10.5 (K/uL)    RBC 3.41 (*) 4.22 - 5.81 (MIL/uL)    Hemoglobin 10.3 (*) 13.0 - 17.0 (g/dL)    HCT 13.0 (*) 86.5 - 52.0 (%)    MCV 90.0  78.0 - 100.0 (fL)    MCH 30.2  26.0 - 34.0 (pg)  MCHC 33.6  30.0 - 36.0 (g/dL)    RDW 25.9  56.3 - 87.5 (%)    Platelets 22 (*) 150 - 400 (K/uL) CRITICAL VALUE NOTED.  VALUE IS CONSISTENT WITH PREVIOUSLY REPORTED AND CALLED VALUE.    No results found.  Assessment/Plan Patient Active Hospital Problem List: Pulmonary edema with congestive heart failure (08/03/2011)   Assessment: Improved; continue present dose of lasix, bblocker and hydralazine; add imdur; no ACE I secondary to renal insufficiency CAP (community acquired pneumonia) (08/03/2011)   Assessment: Improving   Plan: Continue antibiotics and pulmonary toilet per pulmonary NSTEMI (non-ST elevation myocardial infarction) (08/03/2011)   Assessment: No chest pain   Plan: Hold ASA secondary to thrombocytopenia; continue remaining meds; cath when Plt count improves. Thrombocytopenia (08/06/2011)   Assessment: Plt count pending   Plan: await HIT panel; hold ASA AAA per vascular surgery   Olga Millers MD 08/07/2011, 7:28 AM

## 2011-08-08 DIAGNOSIS — J189 Pneumonia, unspecified organism: Secondary | ICD-10-CM

## 2011-08-08 DIAGNOSIS — J441 Chronic obstructive pulmonary disease with (acute) exacerbation: Secondary | ICD-10-CM

## 2011-08-08 DIAGNOSIS — J9600 Acute respiratory failure, unspecified whether with hypoxia or hypercapnia: Secondary | ICD-10-CM

## 2011-08-08 DIAGNOSIS — D6959 Other secondary thrombocytopenia: Secondary | ICD-10-CM

## 2011-08-08 DIAGNOSIS — I200 Unstable angina: Secondary | ICD-10-CM

## 2011-08-08 LAB — HEPATIC FUNCTION PANEL
AST: 13 U/L (ref 0–37)
Alkaline Phosphatase: 71 U/L (ref 39–117)
Total Bilirubin: 0.2 mg/dL — ABNORMAL LOW (ref 0.3–1.2)

## 2011-08-08 LAB — CBC
HCT: 31.2 % — ABNORMAL LOW (ref 39.0–52.0)
Hemoglobin: 10.3 g/dL — ABNORMAL LOW (ref 13.0–17.0)
MCH: 30.6 pg (ref 26.0–34.0)
MCHC: 33 g/dL (ref 30.0–36.0)
RBC: 3.37 MIL/uL — ABNORMAL LOW (ref 4.22–5.81)

## 2011-08-08 LAB — DIFFERENTIAL
Basophils Absolute: 0 10*3/uL (ref 0.0–0.1)
Basophils Relative: 0 % (ref 0–1)
Monocytes Absolute: 0.6 10*3/uL (ref 0.1–1.0)
Neutro Abs: 5.3 10*3/uL (ref 1.7–7.7)

## 2011-08-08 LAB — DIC (DISSEMINATED INTRAVASCULAR COAGULATION)PANEL
D-Dimer, Quant: 2.49 ug/mL-FEU — ABNORMAL HIGH (ref 0.00–0.48)
Fibrinogen: 276 mg/dL (ref 204–475)
Platelets: 6 10*3/uL — CL (ref 150–400)
Prothrombin Time: 13.8 seconds (ref 11.6–15.2)
Smear Review: NONE SEEN

## 2011-08-08 LAB — BASIC METABOLIC PANEL
Calcium: 8.5 mg/dL (ref 8.4–10.5)
GFR calc non Af Amer: 67 mL/min — ABNORMAL LOW (ref >90)
Glucose, Bld: 90 mg/dL (ref 70–99)
Sodium: 141 mEq/L (ref 135–145)

## 2011-08-08 LAB — LACTATE DEHYDROGENASE: LDH: 170 U/L (ref 94–250)

## 2011-08-08 MED ORDER — AZITHROMYCIN 500 MG PO TABS
500.0000 mg | ORAL_TABLET | Freq: Every day | ORAL | Status: DC
  Administered 2011-08-08: 500 mg via ORAL
  Filled 2011-08-08: qty 1

## 2011-08-08 MED ORDER — MORPHINE SULFATE 2 MG/ML IJ SOLN
2.0000 mg | INTRAMUSCULAR | Status: DC | PRN
  Administered 2011-08-08 – 2011-08-12 (×24): 2 mg via INTRAVENOUS
  Filled 2011-08-08 (×22): qty 1

## 2011-08-08 NOTE — Progress Notes (Signed)
Met with pt to discuss discharge planning.  Pt is uninsured, lives with spouse, who is on disability.  He lost his job in September.   Pt does have primary MD, Dr. Artist Pais.  He lives in [place], and he is agreeable to [facility] consult.  He states paying for some of his medications is a hardship at times...one of his BP meds costs $160/month!  Platelet count is 6 today...needs cath when platelet issue resolved.  Will refer to [facility], investigate medication assistance programs for pt, and recommend use of generic meds by MD at discharge.  Pt appreciative of help given.  Will follow.   Jerrell Belfast, RN, BSN Pager # [phone]

## 2011-08-08 NOTE — Progress Notes (Signed)
Pt Ddimer elevated at 2.49. Result called and verified with Dr. Lanora Manis Deterding.

## 2011-08-08 NOTE — Progress Notes (Signed)
Subjective: Denies SOB or chest pain   Physical Exam:  Blood pressure 138/77, pulse 67, temperature 98.1 F (36.7 C), temperature source Oral, resp. rate 18, height 5\' 7"  (1.702 m), weight 152 lb 14.4 oz (69.355 kg), SpO2 96.00%. Temp:  [98.1 F (36.7 C)-98.6 F (37 C)] 98.1 F (36.7 C) (11/06 0500) Pulse Rate:  [64-73] 67  (11/06 0500) Resp:  [18-19] 18  (11/06 0500) BP: (86-138)/(45-77) 138/77 mmHg (11/06 0500) SpO2:  [96 %-99 %] 96 % (11/06 0733) Weight change:  I/O 11/05 0701 - 11/06 0700 In: 1320 [P.O.:1080; I.V.:240] Out: 1625 [Urine:1625]  HEENT-normal/normal eyelids Neck supple chest - CTA/ normal expansion CV - RRR/normal S1 and S2; no murmurs, rubs or gallops Abdomen -NT/ND, no HSM, no mass, + bowel sounds, no bruit 2+ femoral pulses, no bruits Ext-no edema, chords, 2+ DP Neuro-grossly nonfocal  Results for orders placed during the hospital encounter of 08/04/11 (from the past 48 hour(s))  CBC     Status: Abnormal   Collection Time   08/06/11  8:30 AM      Component Value Range Comment   WBC 11.4 (*) 4.0 - 10.5 (K/uL)    RBC 3.41 (*) 4.22 - 5.81 (MIL/uL)    Hemoglobin 10.3 (*) 13.0 - 17.0 (g/dL)    HCT 08.6 (*) 57.8 - 52.0 (%)    MCV 90.0  78.0 - 100.0 (fL)    MCH 30.2  26.0 - 34.0 (pg)    MCHC 33.6  30.0 - 36.0 (g/dL)    RDW 46.9  62.9 - 52.8 (%)    Platelets 22 (*) 150 - 400 (K/uL) CRITICAL VALUE NOTED.  VALUE IS CONSISTENT WITH PREVIOUSLY REPORTED AND CALLED VALUE.  CBC     Status: Abnormal   Collection Time   08/07/11  9:21 AM      Component Value Range Comment   WBC 7.8  4.0 - 10.5 (K/uL)    RBC 3.51 (*) 4.22 - 5.81 (MIL/uL)    Hemoglobin 10.8 (*) 13.0 - 17.0 (g/dL)    HCT 41.3 (*) 24.4 - 52.0 (%)    MCV 91.5  78.0 - 100.0 (fL)    MCH 30.8  26.0 - 34.0 (pg)    MCHC 33.6  30.0 - 36.0 (g/dL)    RDW 01.0  27.2 - 53.6 (%)    Platelets <5 (*) 150 - 400 (K/uL)   BASIC METABOLIC PANEL     Status: Abnormal   Collection Time   08/07/11  9:21 AM   Component Value Range Comment   Sodium 142  135 - 145 (mEq/L)    Potassium 3.3 (*) 3.5 - 5.1 (mEq/L)    Chloride 105  96 - 112 (mEq/L)    CO2 28  19 - 32 (mEq/L)    Glucose, Bld 109 (*) 70 - 99 (mg/dL)    BUN 23  6 - 23 (mg/dL)    Creatinine, Ser 6.44  0.50 - 1.35 (mg/dL)    Calcium 8.8  8.4 - 10.5 (mg/dL)    GFR calc non Af Amer 72 (*) >90 (mL/min)    GFR calc Af Amer 84 (*) >90 (mL/min)   CBC     Status: Abnormal   Collection Time   08/08/11  5:10 AM      Component Value Range Comment   WBC 8.3  4.0 - 10.5 (K/uL)    RBC 3.37 (*) 4.22 - 5.81 (MIL/uL)    Hemoglobin 10.3 (*) 13.0 - 17.0 (g/dL)    HCT 03.4 (*)  39.0 - 52.0 (%)    MCV 92.6  78.0 - 100.0 (fL)    MCH 30.6  26.0 - 34.0 (pg)    MCHC 33.0  30.0 - 36.0 (g/dL)    RDW 16.1  09.6 - 04.5 (%)    Platelets <5 (*) 150 - 400 (K/uL)   BASIC METABOLIC PANEL     Status: Abnormal   Collection Time   08/08/11  5:10 AM      Component Value Range Comment   Sodium 141  135 - 145 (mEq/L)    Potassium 3.8  3.5 - 5.1 (mEq/L)    Chloride 104  96 - 112 (mEq/L)    CO2 28  19 - 32 (mEq/L)    Glucose, Bld 90  70 - 99 (mg/dL)    BUN 23  6 - 23 (mg/dL)    Creatinine, Ser 4.09  0.50 - 1.35 (mg/dL)    Calcium 8.5  8.4 - 10.5 (mg/dL)    GFR calc non Af Amer 67 (*) >90 (mL/min)    GFR calc Af Amer 78 (*) >90 (mL/min)   DIFFERENTIAL     Status: Normal   Collection Time   08/08/11  5:10 AM      Component Value Range Comment   Neutrophils Relative 64  43 - 77 (%)    Neutro Abs 5.3  1.7 - 7.7 (K/uL)    Lymphocytes Relative 28  12 - 46 (%)    Lymphs Abs 2.3  0.7 - 4.0 (K/uL)    Monocytes Relative 7  3 - 12 (%)    Monocytes Absolute 0.6  0.1 - 1.0 (K/uL)    Eosinophils Relative 1  0 - 5 (%)    Eosinophils Absolute 0.1  0.0 - 0.7 (K/uL)    Basophils Relative 0  0 - 1 (%)    Basophils Absolute 0.0  0.0 - 0.1 (K/uL)   HEPATIC FUNCTION PANEL     Status: Abnormal   Collection Time   08/08/11  5:10 AM      Component Value Range Comment   Total  Protein 5.5 (*) 6.0 - 8.3 (g/dL)    Albumin 2.7 (*) 3.5 - 5.2 (g/dL)    AST 13  0 - 37 (U/L)    ALT 20  0 - 53 (U/L)    Alkaline Phosphatase 71  39 - 117 (U/L)    Total Bilirubin 0.2 (*) 0.3 - 1.2 (mg/dL)    Bilirubin, Direct <8.1  0.0 - 0.3 (mg/dL)    Indirect Bilirubin NOT CALCULATED  0.3 - 0.9 (mg/dL)   DIC PANEL     Status: Abnormal   Collection Time   08/08/11  5:10 AM      Component Value Range Comment   Prothrombin Time 13.8  11.6 - 15.2 (seconds)    INR 1.04  0.00 - 1.49     aPTT 26  24 - 37 (seconds)    Fibrinogen 276  204 - 475 (mg/dL)    D-Dimer, Quant 1.91 (*) 0.00 - 0.48 (ug/mL-FEU)    Platelets 6 (*) 150 - 400 (K/uL) CRITICAL VALUE NOTED.  VALUE IS CONSISTENT WITH PREVIOUSLY REPORTED AND CALLED VALUE.   Smear Review NO SCHISTOCYTES SEEN     LACTATE DEHYDROGENASE     Status: Normal   Collection Time   08/08/11  5:10 AM      Component Value Range Comment   LD 170  94 - 250 (U/L)     Dg Chest Portable 1 View  08/07/2011  *RADIOLOGY REPORT*  Clinical Data:  PORTABLE CHEST - 1 VIEW  Comparison: 08/05/2011  Findings: Heart remains mildly enlarged.  Right neck vascular catheter is stable.  Patchy consolidation at the lung bases has improved.  No pneumothorax.  Vascular congestion has improved.  IMPRESSION: Improved bibasilar patchy airspace disease.  Improved vascular congestion.  Original Report Authenticated By: Donavan Burnet, M.D.    Assessment/Plan Patient Active Hospital Problem List: Pulmonary edema with congestive heart failure (08/03/2011) Assessment: Improved; patient euvolemic; continue Beta blocker; dc lasix, hydralazine and nitrates secondary to hypotension. Add ACEI later as renal function has improved. CAP (community acquired pneumonia) (08/03/2011) Assessment: Improving Plan: Continue antibiotics and pulmonary toilet per pulmonary NSTEMI (non-ST elevation myocardial infarction) (08/03/2011) Assessment: No chest pain Plan: Hold ASA secondary to  thrombocytopenia; continue remaining meds; cath when Plt count improves.  Thrombocytopenia (08/06/2011) Plan: await HIT panel; hold ASA; heme is following; feels may be related to vancomycin which is now dced. No transfusion of platelets unless active bleeding per heme. AAA per vascular surgery   Olga Millers MD 08/08/2011, 7:49 AM
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
## 2011-08-08 NOTE — Progress Notes (Signed)
ANTIBIOTIC CONSULT NOTE - FOLLOW UP  Pharmacy Consult for Vancomycin Indication: pneumonia  Allergies  Allergen Reactions  . Vancomycin     thrombocytopenia  . Cephalexin     REACTION: anaphylactic shock  . Hydromorphone Other (See Comments)    Abnormal behavior.  . Moxifloxacin     REACTION: achilles tendon rupture  . Penicillins     REACTION: rash    Patient Measurements: Height: 5\' 7"  (170.2 cm) Weight: 152 lb 14.4 oz (69.355 kg) IBW/kg (Calculated) : 66.1    Vital Signs: Temp: 98.1 F (36.7 C) (11/06 0500) Temp src: Oral (11/05 2100) BP: 138/77 mmHg (11/06 0500) Pulse Rate: 67  (11/06 0500) Intake/Output from previous day: 11/05 0701 - 11/06 0700 In: 1320 [P.O.:1080; I.V.:240] Out: 1625 [Urine:1625] Intake/Output from this shift:    Labs:  Basename 08/08/11 0510 08/07/11 0921 08/06/11 0830 08/06/11 0301  WBC 8.3 7.8 11.4* --  HGB 10.3* 10.8* 10.3* --  PLT <5*6* <5* 22* --  LABCREA -- -- -- --  CREATININE 1.14 1.07 -- 1.14   Estimated Creatinine Clearance: 62.8 ml/min (by C-G formula based on Cr of 1.14). No results found for this basename: VANCOTROUGH:2,VANCOPEAK:2,VANCORANDOM:2,GENTTROUGH:2,GENTPEAK:2,GENTRANDOM:2,TOBRATROUGH:2,TOBRAPEAK:2,TOBRARND:2,AMIKACINPEAK:2,AMIKACINTROU:2,AMIKACIN:2, in the last 72 hours   Microbiology: Recent Results (from the past 720 hour(s))  MRSA PCR SCREENING     Status: Normal   Collection Time   07/31/11  3:54 PM      Component Value Range Status Comment   MRSA by PCR NEGATIVE  NEGATIVE  Final   CULTURE, RESPIRATORY     Status: Normal   Collection Time   07/31/11  5:20 PM      Component Value Range Status Comment   Specimen Description SPUTUM   Final    Special Requests NONE   Final    Gram Stain     Final    Value: ABUNDANT WBC PRESENT,BOTH PMN AND MONONUCLEAR     FEW SQUAMOUS EPITHELIAL CELLS PRESENT     ABUNDANT GRAM POSITIVE COCCI     IN PAIRS IN CHAINS   Culture ABUNDANT STREPTOCOCCUS PNEUMONIAE   Final      Report Status 08/05/2011 FINAL   Final    Organism ID, Bacteria STREPTOCOCCUS PNEUMONIAE   Final   CULTURE, BLOOD (ROUTINE X 2)     Status: Normal   Collection Time   07/31/11  9:05 PM      Component Value Range Status Comment   Specimen Description BLOOD RIGHT HAND   Final    Special Requests BOTTLES DRAWN AEROBIC ONLY 2 CC   Final    Setup Time [id number]   Final    Culture NO GROWTH 5 DAYS   Final    Report Status 08/07/2011 FINAL   Final   URINE CULTURE     Status: Normal   Collection Time   07/31/11  9:07 PM      Component Value Range Status Comment   Specimen Description URINE, CATHETERIZED   Final    Special Requests IMMUNE:NORM UT SYMPT:NEG   Final    Setup Time [id number]   Final    Colony Count NO GROWTH   Final    Culture NO GROWTH   Final    Report Status 08/02/2011 FINAL   Final   CULTURE, BLOOD (ROUTINE X 2)     Status: Normal   Collection Time   07/31/11  9:15 PM      Component Value Range Status Comment   Specimen Description BLOOD RIGHT HAND   Final  Special Requests BOTTLES DRAWN AEROBIC ONLY 2 CC   Final    Setup Time [id number]   Final    Culture NO GROWTH 5 DAYS   Final    Report Status 08/07/2011 FINAL   Final     Anti-infectives     Start     Dose/Rate Route Frequency Ordered Stop   08/07/11 2000   azithromycin (ZITHROMAX) 500 mg in dextrose 5 % 250 mL IVPB  Status:  Discontinued     Comments: Check with bedsid MD on 08/08/11 if still needed      500 mg 250 mL/hr over 60 Minutes Intravenous Every 24 hours 08/07/11 1920 08/08/11 0758   08/05/11 2100   vancomycin (VANCOCIN) IVPB 1000 mg/200 mL premix  Status:  Discontinued        1,000 mg 200 mL/hr over 60 Minutes Intravenous Every 24 hours 08/05/11 1709 08/07/11 1920   08/04/11 2200   aztreonam (AZACTAM) injection 1 g  Status:  Discontinued        1 g Intravenous 3 times per day 08/04/11 2014 08/05/11 1708   08/04/11 2100   vancomycin (VANCOCIN) IVPB 1000 mg/200 mL premix  Status:   Discontinued        1,000 mg 200 mL/hr over 60 Minutes Intravenous Every 24 hours 08/04/11 2039 08/05/11 1117          Assessment: 60 year old on vancomycin Day #9 for pneumococcal pneumonia.  Scr stable, afebrile, and BC negative.    Goal of Therapy:  Vancomycin trough level 15-20 mcg/ml  Plan:  Continue Vancomycin 1 Gram IV q24, Follow up LOT  Elwin Sleight 08/08/2011,8:56 AM

## 2011-08-08 NOTE — Progress Notes (Signed)
Pt had a 5 beat run vtach, nonsustained. Pt sleeping and asymptomatic. BP 121/73, HR 54-SB. EKG strip printed and placed in chart. Will continue to monitor.

## 2011-08-08 NOTE — Progress Notes (Signed)
    60yo male smoker admitted 11/2 from [facility] fro a planned cardiac cath. He was originally admitted 10/29 forAMS r/t morphine use and possible ETOH detox. He was intubated x2, suffered ARF(resolved) and on 10/31 had NSTEMI. His EF dropped from 45% down to 25%. PMH stable AAA, COPD, alcoholic hepatitis  RIJ 10/31 >>45/4 ETT 10/29 >> 10/30, 11/1 >> 11/2  Cx sputum 10/29 >> strep pneumniae Azactam 10/29 >>11/3 vanc 10/29 >> (note allergies) >>11/5 d/t low platelet   renal vasc (dickson) cards (cooper) Heme (murinson)  HPI/Events of Note afebrile, no dyspnea  Past History NEURO-migraines, left eye injury with right visual field blindness CVASC-hypertension requiring treatment; peripheral vascular disease; index PULM-COPD - moderate GI-Cirrhosis: encephalopathy; alcoholic hepatitis RHEUM-chronic back pain  Filed Vitals:   08/07/11 2100 08/08/11 0406 08/08/11 0500 08/08/11 0733  BP: 103/67  138/77   Pulse: 73  67   Temp: 98.6 F (37 C)  98.1 F (36.7 C)   TempSrc: Oral     Resp: 19  18   Height:      Weight:      SpO2: 96% 96% 96% 96%     Physical Exam  NEURO-Mental Status: agitated at times; GCS: M-6, V-3, E-4; awake and alert, weaning sedation. HEAD/NECK-Pupils: equal, react to light; Neck: JVD present, Mouth/Pharynx: Dry mucosa PULM-prolonged exhalation;distant bs CVASC-S1 normal; S2 normal; systolic murmur; Pulses: normal GI-no pain on palpation; Bowel Sounds: decreased GU-foley catheter; External Genitalia: normal EXTREM-no edema;Perfusion: adequate CBC    Component Value Date/Time   WBC 8.3 08/08/2011 0510   RBC 3.37* 08/08/2011 0510   HGB 10.3* 08/08/2011 0510   HCT 31.2* 08/08/2011 0510   PLT <5* 08/08/2011 0510   PLT 6* 08/08/2011 0510   MCV 92.6 08/08/2011 0510   MCH 30.6 08/08/2011 0510   MCHC 33.0 08/08/2011 0510   RDW 13.1 08/08/2011 0510   LYMPHSABS 2.3 08/08/2011 0510   MONOABS 0.6 08/08/2011 0510   EOSABS 0.1 08/08/2011 0510   BASOSABS 0.0 08/08/2011  0510    BMET    Component Value Date/Time   NA 141 08/08/2011 0510   K 3.8 08/08/2011 0510   CL 104 08/08/2011 0510   CO2 28 08/08/2011 0510   GLUCOSE 90 08/08/2011 0510   BUN 23 08/08/2011 0510   CREATININE 1.14 08/08/2011 0510   CALCIUM 8.5 08/08/2011 0510   GFRNONAA 67* 08/08/2011 0510   GFRAA 78* 08/08/2011 0510    Diagnoses PULM: ACUTE RESPIRATORY FAILURE acute COPD exacerbation Taper steroids - over 2 weeks Cont BD neb med Add flutter valve  NEURO: encephalopathy - resolved need to reassess pain meds  RENAL: acute renal failure Hold  Lasix  ARF is better    ID: Pneumococal pneumonia dc'd azactam Ct  vanc since allergic to PCN, quinolones Ask ID for input on what PO med we can switch this pt over >>>change to po azithromycin for now  CVASC: acute coronary syndrome-acute myocardial infarction (no ST elevation) congestive heart failure cath planned monday Rpt CT for AAA in 6 mnths Hold ACE ->>restart after cath  Thrombocytopenia - hold sq heparin  Heme feels is d/t vancomycin HIT panel pending  Hypokalemia repleted

## 2011-08-09 LAB — CBC
HCT: 32.9 % — ABNORMAL LOW (ref 39.0–52.0)
MCHC: 33.1 g/dL (ref 30.0–36.0)
Platelets: 22 10*3/uL — CL (ref 150–400)
RDW: 13.3 % (ref 11.5–15.5)
WBC: 11.2 10*3/uL — ABNORMAL HIGH (ref 4.0–10.5)

## 2011-08-09 LAB — OCCULT BLOOD X 1 CARD TO LAB, STOOL: Fecal Occult Bld: NEGATIVE

## 2011-08-09 LAB — BASIC METABOLIC PANEL
BUN: 22 mg/dL (ref 6–23)
Calcium: 8.8 mg/dL (ref 8.4–10.5)
Creatinine, Ser: 1.24 mg/dL (ref 0.50–1.35)
GFR calc Af Amer: 70 mL/min — ABNORMAL LOW (ref >90)
GFR calc non Af Amer: 61 mL/min — ABNORMAL LOW (ref >90)

## 2011-08-09 LAB — HEPARIN INDUCED THROMBOCYTOPENIA PNL
Heparin Induced Plt Ab: NEGATIVE
UFH High Dose UFH H: 0 % Release
UFH Low Dose 0.5 IU/mL: 0 % Release

## 2011-08-09 MED ORDER — FUROSEMIDE 20 MG PO TABS
20.0000 mg | ORAL_TABLET | Freq: Every day | ORAL | Status: DC
  Filled 2011-08-09: qty 1

## 2011-08-09 MED ORDER — PREDNISONE 20 MG PO TABS
30.0000 mg | ORAL_TABLET | Freq: Every day | ORAL | Status: DC
  Administered 2011-08-09 – 2011-08-11 (×3): 30 mg via ORAL
  Filled 2011-08-09 (×5): qty 1

## 2011-08-09 MED ORDER — POTASSIUM CHLORIDE CRYS ER 20 MEQ PO TBCR
20.0000 meq | EXTENDED_RELEASE_TABLET | Freq: Every day | ORAL | Status: DC
  Filled 2011-08-09: qty 1

## 2011-08-09 NOTE — Progress Notes (Signed)
Subjective: No SOB or CP   Physical Exam:   Blood pressure 150/76, pulse 57, temperature 98 F (36.7 C), temperature source Oral, resp. rate 20, height 5\' 7"  (1.702 m), weight 152 lb 14.4 oz (69.355 kg), SpO2 100.00%. Temp:  [97.5 F (36.4 C)-98.1 F (36.7 C)] 98 F (36.7 C) (11/07 0612) Pulse Rate:  [53-61] 57  (11/07 0612) Resp:  [18-20] 20  (11/07 0612) BP: (100-150)/(61-76) 150/76 mmHg (11/07 0612) SpO2:  [95 %-100 %] 100 % (11/07 0732) Weight change:  I/O 11/06 0701 - 11/07 0700 In: -  Out: 900 [Urine:900]  HEENT-normal/normal eyelids Neck supple chest - diminished BS throughout CV - RRR/normal S1 and S2; no murmurs, rubs or gallops Abdomen -NT/ND, no HSM, no mass, + bowel sounds, no bruit 2+ femoral pulses, no bruits Ext-no edema, chords, 2+ DP Neuro-grossly nonfocal  Results for orders placed during the hospital encounter of 08/04/11 (from the past 48 hour(s))  CBC     Status: Abnormal   Collection Time   08/07/11  9:21 AM      Component Value Range Comment   WBC 7.8  4.0 - 10.5 (K/uL)    RBC 3.51 (*) 4.22 - 5.81 (MIL/uL)    Hemoglobin 10.8 (*) 13.0 - 17.0 (g/dL)    HCT 78.2 (*) 95.6 - 52.0 (%)    MCV 91.5  78.0 - 100.0 (fL)    MCH 30.8  26.0 - 34.0 (pg)    MCHC 33.6  30.0 - 36.0 (g/dL)    RDW 21.3  08.6 - 57.8 (%)    Platelets <5 (*) 150 - 400 (K/uL)   BASIC METABOLIC PANEL     Status: Abnormal   Collection Time   08/07/11  9:21 AM      Component Value Range Comment   Sodium 142  135 - 145 (mEq/L)    Potassium 3.3 (*) 3.5 - 5.1 (mEq/L)    Chloride 105  96 - 112 (mEq/L)    CO2 28  19 - 32 (mEq/L)    Glucose, Bld 109 (*) 70 - 99 (mg/dL)    BUN 23  6 - 23 (mg/dL)    Creatinine, Ser 4.69  0.50 - 1.35 (mg/dL)    Calcium 8.8  8.4 - 10.5 (mg/dL)    GFR calc non Af Amer 72 (*) >90 (mL/min)    GFR calc Af Amer 84 (*) >90 (mL/min)   CBC     Status: Abnormal   Collection Time   08/08/11  5:10 AM      Component Value Range Comment   WBC 8.3  4.0 - 10.5 (K/uL)      RBC 3.37 (*) 4.22 - 5.81 (MIL/uL)    Hemoglobin 10.3 (*) 13.0 - 17.0 (g/dL)    HCT 62.9 (*) 52.8 - 52.0 (%)    MCV 92.6  78.0 - 100.0 (fL)    MCH 30.6  26.0 - 34.0 (pg)    MCHC 33.0  30.0 - 36.0 (g/dL)    RDW 41.3  24.4 - 01.0 (%)    Platelets <5 (*) 150 - 400 (K/uL)   BASIC METABOLIC PANEL     Status: Abnormal   Collection Time   08/08/11  5:10 AM      Component Value Range Comment   Sodium 141  135 - 145 (mEq/L)    Potassium 3.8  3.5 - 5.1 (mEq/L)    Chloride 104  96 - 112 (mEq/L)    CO2 28  19 - 32 (mEq/L)  Glucose, Bld 90  70 - 99 (mg/dL)    BUN 23  6 - 23 (mg/dL)    Creatinine, Ser 1.61  0.50 - 1.35 (mg/dL)    Calcium 8.5  8.4 - 10.5 (mg/dL)    GFR calc non Af Amer 67 (*) >90 (mL/min)    GFR calc Af Amer 78 (*) >90 (mL/min)   DIFFERENTIAL     Status: Normal   Collection Time   08/08/11  5:10 AM      Component Value Range Comment   Neutrophils Relative 64  43 - 77 (%)    Neutro Abs 5.3  1.7 - 7.7 (K/uL)    Lymphocytes Relative 28  12 - 46 (%)    Lymphs Abs 2.3  0.7 - 4.0 (K/uL)    Monocytes Relative 7  3 - 12 (%)    Monocytes Absolute 0.6  0.1 - 1.0 (K/uL)    Eosinophils Relative 1  0 - 5 (%)    Eosinophils Absolute 0.1  0.0 - 0.7 (K/uL)    Basophils Relative 0  0 - 1 (%)    Basophils Absolute 0.0  0.0 - 0.1 (K/uL)   HEPATIC FUNCTION PANEL     Status: Abnormal   Collection Time   08/08/11  5:10 AM      Component Value Range Comment   Total Protein 5.5 (*) 6.0 - 8.3 (g/dL)    Albumin 2.7 (*) 3.5 - 5.2 (g/dL)    AST 13  0 - 37 (U/L)    ALT 20  0 - 53 (U/L)    Alkaline Phosphatase 71  39 - 117 (U/L)    Total Bilirubin 0.2 (*) 0.3 - 1.2 (mg/dL)    Bilirubin, Direct <0.9  0.0 - 0.3 (mg/dL)    Indirect Bilirubin NOT CALCULATED  0.3 - 0.9 (mg/dL)   DIC PANEL     Status: Abnormal   Collection Time   08/08/11  5:10 AM      Component Value Range Comment   Prothrombin Time 13.8  11.6 - 15.2 (seconds)    INR 1.04  0.00 - 1.49     aPTT 26  24 - 37 (seconds)     Fibrinogen 276  204 - 475 (mg/dL)    D-Dimer, Quant 6.04 (*) 0.00 - 0.48 (ug/mL-FEU)    Platelets 6 (*) 150 - 400 (K/uL) CRITICAL VALUE NOTED.  VALUE IS CONSISTENT WITH PREVIOUSLY REPORTED AND CALLED VALUE.   Smear Review NO SCHISTOCYTES SEEN     LACTATE DEHYDROGENASE     Status: Normal   Collection Time   08/08/11  5:10 AM      Component Value Range Comment   LD 170  94 - 250 (U/L)   BASIC METABOLIC PANEL     Status: Abnormal   Collection Time   08/09/11  6:25 AM      Component Value Range Comment   Sodium 143  135 - 145 (mEq/L)    Potassium 3.8  3.5 - 5.1 (mEq/L)    Chloride 105  96 - 112 (mEq/L)    CO2 31  19 - 32 (mEq/L)    Glucose, Bld 93  70 - 99 (mg/dL)    BUN 22  6 - 23 (mg/dL)    Creatinine, Ser 5.40  0.50 - 1.35 (mg/dL)    Calcium 8.8  8.4 - 10.5 (mg/dL)    GFR calc non Af Amer 61 (*) >90 (mL/min)    GFR calc Af Amer 70 (*) >90 (mL/min)  CBC     Status: Abnormal   Collection Time   08/09/11  6:25 AM      Component Value Range Comment   WBC 11.2 (*) 4.0 - 10.5 (K/uL)    RBC 3.52 (*) 4.22 - 5.81 (MIL/uL)    Hemoglobin 10.9 (*) 13.0 - 17.0 (g/dL)    HCT 40.9 (*) 81.1 - 52.0 (%)    MCV 93.5  78.0 - 100.0 (fL)    MCH 31.0  26.0 - 34.0 (pg)    MCHC 33.1  30.0 - 36.0 (g/dL)    RDW 91.4  78.2 - 95.6 (%)    Platelets 22 (*) 150 - 400 (K/uL)     Dg Chest Portable 1 View  08/07/2011  *RADIOLOGY REPORT*  Clinical Data:  PORTABLE CHEST - 1 VIEW  Comparison: 08/05/2011  Findings: Heart remains mildly enlarged.  Right neck vascular catheter is stable.  Patchy consolidation at the lung bases has improved.  No pneumothorax.  Vascular congestion has improved.  IMPRESSION: Improved bibasilar patchy airspace disease.  Improved vascular congestion.  Original Report Authenticated By: Donavan Burnet, M.D.    Assessment/Plan Patient Active Hospital Problem List: Pulmonary edema with congestive heart failure (08/03/2011) Assessment: Improved; patient euvolemic; continue Beta blocker; Hold  lasix and KCL in anticipation of cath. Add ACEI following cath as renal function has improved. CAP (community acquired pneumonia) (08/03/2011) Assessment: Improving Plan: Continue antibiotics and pulmonary toilet per pulmonary NSTEMI (non-ST elevation myocardial infarction) (08/03/2011) Assessment: No chest pain Plan: Hold ASA secondary to thrombocytopenia; continue remaining meds; cath when Plt count improves.  Thrombocytopenia (08/06/2011) Plan: Plt count improving; hold ASA; heme is following; feels may be related to vancomycin which is now dced. No transfusion of platelets unless active bleeding per heme. Hopefully will be able to cath Friday. AAA per vascular surgery    Olga Millers MD 08/09/2011, 9:10 AM

## 2011-08-09 NOTE — Progress Notes (Addendum)
     60yo male smoker admitted 11/2 from [facility] fro a planned cardiac cath. He was originally admitted 10/29 forAMS r/t morphine use and possible ETOH detox. He was intubated x2, suffered ARF(resolved) and on 10/31 had NSTEMI. His EF dropped from 45% down to 25%. PMH stable AAA, COPD, alcoholic hepatitis  Low Plt d/t vancomycin  RIJ 10/31 >>11/6 ETT 10/29 >> 10/30, 11/1 >> 11/2  Cx sputum 10/29 >> strep pneumniae Azactam 10/29 >>11/3 vanc 10/29 >> (note allergies) >>11/5 d/t low platelet 11/5 azithro>>>11/6   renal vasc (dickson) cards (cooper) Heme (murinson)  HPI/Events of Note afebrile, no dyspnea, Bp trend up.  Plt count better   Past History NEURO-migraines, left eye injury with right visual field blindness CVASC-hypertension requiring treatment; peripheral vascular disease; index PULM-COPD - moderate GI-Cirrhosis: encephalopathy; alcoholic hepatitis RHEUM-chronic back pain  Filed Vitals:   08/08/11 2149 08/09/11 0145 08/09/11 0612 08/09/11 0732  BP:   150/76   Pulse:   57   Temp:   98 F (36.7 C)   TempSrc:      Resp:   20   Height:      Weight:      SpO2: 98% 100% 97% 100%     Physical Exam  NEURO-Mental Status: agitated at times; GCS: M-6, V-3, E-4; awake and alert, weaning sedation. HEAD/NECK-Pupils: equal, react to light; Neck: JVD present, Mouth/Pharynx: Dry mucosa PULM-prolonged exhalation;distant bs, few exp wheezes  CVASC-S1 normal; S2 normal; systolic murmur; Pulses: normal GI-no pain on palpation; Bowel Sounds: decreased GU-foley catheter; External Genitalia: normal EXTREM-no edema;Perfusion: adequate CBC    Component Value Date/Time   WBC 11.2* 08/09/2011 0625   RBC 3.52* 08/09/2011 0625   HGB 10.9* 08/09/2011 0625   HCT 32.9* 08/09/2011 0625   PLT 22* 08/09/2011 0625>>>>PLT count better    MCV 93.5 08/09/2011 0625   MCH 31.0 08/09/2011 0625   MCHC 33.1 08/09/2011 0625   RDW 13.3 08/09/2011 0625   LYMPHSABS 2.3 08/08/2011 0510   MONOABS 0.6  08/08/2011 0510   EOSABS 0.1 08/08/2011 0510   BASOSABS 0.0 08/08/2011 0510    BMET    Component Value Date/Time   NA 143 08/09/2011 0625   K 3.8 08/09/2011 0625   CL 105 08/09/2011 0625   CO2 31 08/09/2011 0625   GLUCOSE 93 08/09/2011 0625   BUN 22 08/09/2011 0625   CREATININE 1.24 08/09/2011 0625   CALCIUM 8.8 08/09/2011 0625   GFRNONAA 61* 08/09/2011 0625   GFRAA 70* 08/09/2011 0625   CXR 11/5: better aeration Diagnoses PULM: ACUTE RESPIRATORY FAILURE>>>resolved acute COPD exacerbation>>>improved Taper steroids - over 2 weeks Cont BD neb med>>will maintain neb med at home with generic meds Cont  flutter valve  NEURO: encephalopathy - resolved  RENAL: acute renal failure>>>better Low dose lasix to resume with KCL    ID: Pneumococal pneumonia Off vanco, on azithromycin>>ID recommended d/c all abx and I have done so  CVASC: acute coronary syndrome-acute myocardial infarction (no ST elevation) congestive heart failure Resume lasix, imdur, ACE later Cath when PLT count is better  Rpt CT for AAA in 6 mnths   Thrombocytopenia - hold sq heparin add SCD  Heme feels is d/t vancomycin HIT panel  neg  Hypokalemia repleted

## 2011-08-10 DIAGNOSIS — J441 Chronic obstructive pulmonary disease with (acute) exacerbation: Secondary | ICD-10-CM

## 2011-08-10 DIAGNOSIS — D6959 Other secondary thrombocytopenia: Secondary | ICD-10-CM

## 2011-08-10 DIAGNOSIS — I200 Unstable angina: Secondary | ICD-10-CM

## 2011-08-10 LAB — CBC
HCT: 32.5 % — ABNORMAL LOW (ref 39.0–52.0)
Hemoglobin: 11.1 g/dL — ABNORMAL LOW (ref 13.0–17.0)
Hemoglobin: 10.7 g/dL — ABNORMAL LOW (ref 13.0–17.0)
MCH: 31 pg (ref 26.0–34.0)
MCHC: 32.9 g/dL (ref 30.0–36.0)
MCV: 93.6 fL (ref 78.0–100.0)
MCV: 94.2 fL (ref 78.0–100.0)
Platelets: 55 10*3/uL — ABNORMAL LOW (ref 150–400)
RBC: 3.58 MIL/uL — ABNORMAL LOW (ref 4.22–5.81)
RDW: 13.5 % (ref 11.5–15.5)
WBC: 13.1 10*3/uL — ABNORMAL HIGH (ref 4.0–10.5)

## 2011-08-10 LAB — BASIC METABOLIC PANEL
CO2: 29 mEq/L (ref 19–32)
Calcium: 9 mg/dL (ref 8.4–10.5)
Chloride: 101 mEq/L (ref 96–112)
Glucose, Bld: 86 mg/dL (ref 70–99)
Potassium: 4 mEq/L (ref 3.5–5.1)
Sodium: 137 mEq/L (ref 135–145)

## 2011-08-10 MED ORDER — SODIUM CHLORIDE 0.9 % IJ SOLN: 3.0000 mL | INTRAMUSCULAR | Status: DC | PRN

## 2011-08-10 MED ORDER — SODIUM CHLORIDE 0.9 % IJ SOLN
3.0000 mL | Freq: Two times a day (BID) | INTRAMUSCULAR | Status: DC
  Administered 2011-08-10: 3 mL via INTRAVENOUS

## 2011-08-10 MED ORDER — DIAZEPAM 5 MG PO TABS: 5.0000 mg | ORAL_TABLET | ORAL | Status: DC

## 2011-08-10 MED ORDER — ASPIRIN 81 MG PO CHEW
81.0000 mg | CHEWABLE_TABLET | Freq: Every day | ORAL | Status: DC
  Administered 2011-08-10: 81 mg via ORAL
  Filled 2011-08-10: qty 1

## 2011-08-10 MED ORDER — SODIUM CHLORIDE 0.9 % IV SOLN
INTRAVENOUS | Status: DC
  Administered 2011-08-11: 06:00:00 via INTRAVENOUS

## 2011-08-10 MED ORDER — SODIUM CHLORIDE 0.9 % IV SOLN: 250.0000 mL | INTRAVENOUS | Status: DC

## 2011-08-10 MED ORDER — ASPIRIN 81 MG PO CHEW
324.0000 mg | CHEWABLE_TABLET | ORAL | Status: AC
  Administered 2011-08-11: 324 mg via ORAL
  Filled 2011-08-10: qty 4

## 2011-08-10 NOTE — Progress Notes (Signed)
      60yo male smoker admitted 11/2 [name] fro a planned cardiac cath. He was originally admitted 10/29 forAMS r/t morphine use and possible ETOH detox. He was intubated x2, suffered ARF(resolved) and on 10/31 had NSTEMI. His EF dropped from 45% down to 25%. PMH stable AAA, COPD, alcoholic hepatitis  Low Plt d/t vancomycin  RIJ 10/31 >>11/6 ETT 10/29 >> 10/30, 11/1 >> 11/2  Cx sputum 10/29 >> strep pneumniae Azactam 10/29 >>11/3 vanc 10/29 >> (note allergies) >>11/5 d/t low platelet 11/5 azithro>>>11/6   renal vasc (dickson) cards (cooper) Heme (murinson)  HPI/Events of Note afebrile, no dyspnea, Bp trend up.  Plt count better   Past History NEURO-migraines, left eye injury with right visual field blindness CVASC-hypertension requiring treatment; peripheral vascular disease; index PULM-COPD - moderate GI-Cirrhosis: encephalopathy; alcoholic hepatitis RHEUM-chronic back pain  Filed Vitals:   08/09/11 2200 08/10/11 0148 08/10/11 0600 08/10/11 0700  BP: 121/67  142/67   Pulse: 59 61 52   Temp: 97.7 F (36.5 C)  97.7 F (36.5 C)   TempSrc:   Oral   Resp: 17 18 19    Height:      Weight:      SpO2: 98%  100% 100%     Physical Exam  NEURO-Mental Status: alert, Ox3 HEAD/NECK-Pupils: equal, react to light; Neck: JVD present, Mouth/Pharynx: Dry mucosa PULM-prolonged exhalation;distant bs,no wheezes  CVASC-S1 normal; S2 normal; systolic murmur; Pulses: normal GI-no pain on palpation; Bowel Sounds: decreased GU-foley catheter; External Genitalia: normal EXTREM-no edema;Perfusion: adequate CBC    Component Value Date/Time   WBC 11.2* 08/09/2011 0625   RBC 3.52* 08/09/2011 0625   HGB 10.9* 08/09/2011 0625   HCT 32.9* 08/09/2011 0625   PLT 22* 08/09/2011 0625>>>>PLT count better    MCV 93.5 08/09/2011 0625   MCH 31.0 08/09/2011 0625   MCHC 33.1 08/09/2011 0625   RDW 13.3 08/09/2011 0625   LYMPHSABS 2.3 08/08/2011 0510   MONOABS 0.6 08/08/2011 0510   EOSABS 0.1 08/08/2011  0510   BASOSABS 0.0 08/08/2011 0510    BMET    Component Value Date/Time   NA 137 08/10/2011 0530   K 4.0 08/10/2011 0530   CL 101 08/10/2011 0530   CO2 29 08/10/2011 0530   GLUCOSE 86 08/10/2011 0530   BUN 25* 08/10/2011 0530   CREATININE 1.10 08/10/2011 0530   CALCIUM 9.0 08/10/2011 0530   GFRNONAA 70* 08/10/2011 0530   GFRAA 81* 08/10/2011 0530   CXR 11/5: better aeration   Diagnoses acute COPD exacerbation>>>improved Taper steroids - over 2 weeks Cont BD neb med>>will maintain neb med at home with generic meds Cont  flutter valve  NEURO: encephalopathy - resolved  RENAL: acute renal failure>>>better Hold lasix for cath 11/9  ID: Pneumococal pneumonia Off all abx  CVASC: acute coronary syndrome-acute myocardial infarction (no ST elevation) congestive heart failure Hold lasix , ACE   Restart after cath Cath 11/9 Rpt CT for AAA in 6 mnths   Thrombocytopenia - hold sq heparin add SCD  Heme feels is d/t vancomycin HIT panel  Neg>>>PLT ct up to 55k 11/8  Hypokalemia repleted   Upon d/c will have to maximize Generic meds

## 2011-08-10 NOTE — Progress Notes (Signed)
Subjective: No chest pain or SOB   Physical Exam:  Blood pressure 142/67, pulse 52, temperature 97.7 F (36.5 C), temperature source Oral, resp. rate 19, height 5\' 7"  (1.702 m), weight 152 lb 14.4 oz (69.355 kg), SpO2 100.00%. Temp:  [97.7 F (36.5 C)-98 F (36.7 C)] 97.7 F (36.5 C) (11/08 0600) Pulse Rate:  [52-61] 52  (11/08 0600) Resp:  [17-19] 19  (11/08 0600) BP: (102-142)/(61-67) 142/67 mmHg (11/08 0600) SpO2:  [94 %-100 %] 100 % (11/08 0700) Weight change:  I/O 11/07 0701 - 11/08 0700 In: 720 [P.O.:720] Out: 925 [Urine:925]  HEENT-normal/normal eyelids Neck supple chest - CTA/ normal expansion CV - RRR/normal S1 and S2; no murmurs, rubs or gallops Abdomen -NT/ND, no HSM, no mass, + bowel sounds, no bruit 2+ femoral pulses, no bruits Ext-no edema, chords, 2+ DP Neuro-grossly nonfocal  Results for orders placed during the hospital encounter of 08/04/11 (from the past 48 hour(s))  BASIC METABOLIC PANEL     Status: Abnormal   Collection Time   08/09/11  6:25 AM      Component Value Range Comment   Sodium 143  135 - 145 (mEq/L)    Potassium 3.8  3.5 - 5.1 (mEq/L)    Chloride 105  96 - 112 (mEq/L)    CO2 31  19 - 32 (mEq/L)    Glucose, Bld 93  70 - 99 (mg/dL)    BUN 22  6 - 23 (mg/dL)    Creatinine, Ser 1.61  0.50 - 1.35 (mg/dL)    Calcium 8.8  8.4 - 10.5 (mg/dL)    GFR calc non Af Amer 61 (*) >90 (mL/min)    GFR calc Af Amer 70 (*) >90 (mL/min)   CBC     Status: Abnormal   Collection Time   08/09/11  6:25 AM      Component Value Range Comment   WBC 11.2 (*) 4.0 - 10.5 (K/uL)    RBC 3.52 (*) 4.22 - 5.81 (MIL/uL)    Hemoglobin 10.9 (*) 13.0 - 17.0 (g/dL)    HCT 09.6 (*) 04.5 - 52.0 (%)    MCV 93.5  78.0 - 100.0 (fL)    MCH 31.0  26.0 - 34.0 (pg)    MCHC 33.1  30.0 - 36.0 (g/dL)    RDW 40.9  81.1 - 91.4 (%)    Platelets 22 (*) 150 - 400 (K/uL)   OCCULT BLOOD X 1 CARD TO LAB, STOOL     Status: Normal   Collection Time   08/09/11  4:59 PM      Component Value  Range Comment   Fecal Occult Bld NEGATIVE     BASIC METABOLIC PANEL     Status: Abnormal   Collection Time   08/10/11  5:30 AM      Component Value Range Comment   Sodium 137  135 - 145 (mEq/L)    Potassium 4.0  3.5 - 5.1 (mEq/L)    Chloride 101  96 - 112 (mEq/L)    CO2 29  19 - 32 (mEq/L)    Glucose, Bld 86  70 - 99 (mg/dL)    BUN 25 (*) 6 - 23 (mg/dL)    Creatinine, Ser 7.82  0.50 - 1.35 (mg/dL)    Calcium 9.0  8.4 - 10.5 (mg/dL)    GFR calc non Af Amer 70 (*) >90 (mL/min)    GFR calc Af Amer 81 (*) >90 (mL/min)   CBC     Status: Abnormal  Collection Time   08/10/11  5:30 AM      Component Value Range Comment   WBC 13.1 (*) 4.0 - 10.5 (K/uL)    RBC 3.58 (*) 4.22 - 5.81 (MIL/uL)    Hemoglobin 11.1 (*) 13.0 - 17.0 (g/dL)    HCT 95.2 (*) 84.1 - 52.0 (%)    MCV 93.6  78.0 - 100.0 (fL)    MCH 31.0  26.0 - 34.0 (pg)    MCHC 33.1  30.0 - 36.0 (g/dL)    RDW 32.4  40.1 - 02.7 (%)    Platelets 55 (*) 150 - 400 (K/uL)     No results found.  Assessment/Plan Patient Active Hospital Problem List: Pulmonary edema with congestive heart failure (08/03/2011) Assessment: Improved; patient euvolemic; continue Beta blocker; Hold lasix and KCL in anticipation of cath. Add ACEI following cath as renal function has improved. CAP (community acquired pneumonia) (08/03/2011) Assessment: Improving Plan: Continue antibiotics and pulmonary toilet per pulmonary NSTEMI (non-ST elevation myocardial infarction) (08/03/2011) Assessment: No chest pain Plan: Resume ASA as plt count improved; continue remaining meds; cath in AM if plt stable.  Thrombocytopenia (08/06/2011) Plan: Plt count improving; heme is following; feels may be related to vancomycin which is now dced. HIT negative. AAA per vascular surgery    Olga Millers MD 08/10/2011, 8:01 AM

## 2011-08-11 ENCOUNTER — Encounter (HOSPITAL_COMMUNITY): Payer: Self-pay

## 2011-08-11 ENCOUNTER — Other Ambulatory Visit: Payer: Self-pay

## 2011-08-11 ENCOUNTER — Encounter (HOSPITAL_COMMUNITY)
Admission: EM | Disposition: A | Discharge status: Home / Self Care | Payer: Self-pay | Source: Ambulatory Visit | Attending: Pulmonary Disease

## 2011-08-11 HISTORY — PX: PERCUTANEOUS CORONARY STENT INTERVENTION (PCI-S): SHX5485

## 2011-08-11 HISTORY — PX: LEFT HEART CATHETERIZATION WITH CORONARY ANGIOGRAM: SHX5451

## 2011-08-11 LAB — POCT ACTIVATED CLOTTING TIME: Activated Clotting Time: 408 seconds

## 2011-08-11 LAB — CBC
Hemoglobin: 10.9 g/dL — ABNORMAL LOW (ref 13.0–17.0)
MCH: 30.9 pg (ref 26.0–34.0)
MCHC: 32.7 g/dL (ref 30.0–36.0)
MCV: 94.3 fL (ref 78.0–100.0)
Platelets: 78 10*3/uL — ABNORMAL LOW (ref 150–400)
RBC: 3.53 MIL/uL — ABNORMAL LOW (ref 4.22–5.81)

## 2011-08-11 LAB — BASIC METABOLIC PANEL
CO2: 30 mEq/L (ref 19–32)
Calcium: 9.3 mg/dL (ref 8.4–10.5)
Creatinine, Ser: 1.2 mg/dL (ref 0.50–1.35)
GFR calc non Af Amer: 63 mL/min — ABNORMAL LOW (ref >90)
Glucose, Bld: 90 mg/dL (ref 70–99)

## 2011-08-11 SURGERY — LEFT HEART CATHETERIZATION WITH CORONARY ANGIOGRAM
Anesthesia: LOCAL | Site: Wrist

## 2011-08-11 MED ORDER — MIDAZOLAM HCL 2 MG/2ML IJ SOLN
INTRAMUSCULAR | Status: AC
  Filled 2011-08-11: qty 2

## 2011-08-11 MED ORDER — NITROGLYCERIN 0.2 MG/ML ON CALL CATH LAB
INTRAVENOUS | Status: AC
  Filled 2011-08-11: qty 1

## 2011-08-11 MED ORDER — CLOPIDOGREL BISULFATE 300 MG PO TABS: 600.0000 mg | ORAL_TABLET | Freq: Once | ORAL | Status: DC

## 2011-08-11 MED ORDER — MORPHINE SULFATE 4 MG/ML IJ SOLN
INTRAMUSCULAR | Status: AC
  Filled 2011-08-11: qty 1

## 2011-08-11 MED ORDER — FLUTICASONE-SALMETEROL 250-50 MCG/DOSE IN AEPB
1.0000 | INHALATION_SPRAY | Freq: Two times a day (BID) | RESPIRATORY_TRACT | Status: DC
  Administered 2011-08-11 – 2011-08-12 (×2): 1 via RESPIRATORY_TRACT
  Filled 2011-08-11: qty 14

## 2011-08-11 MED ORDER — HEPARIN (PORCINE) IN NACL 2-0.9 UNIT/ML-% IJ SOLN
INTRAMUSCULAR | Status: AC
  Filled 2011-08-11: qty 2000

## 2011-08-11 MED ORDER — FENTANYL CITRATE 0.05 MG/ML IJ SOLN
INTRAMUSCULAR | Status: AC
  Filled 2011-08-11: qty 2

## 2011-08-11 MED ORDER — DIAZEPAM 5 MG PO TABS
5.0000 mg | ORAL_TABLET | ORAL | Status: AC
  Administered 2011-08-11: 5 mg via ORAL
  Filled 2011-08-11: qty 1

## 2011-08-11 MED ORDER — SODIUM CHLORIDE 0.9 % IV SOLN
1.0000 mL/kg/h | INTRAVENOUS | Status: AC
  Administered 2011-08-11: 1 mL/kg/h via INTRAVENOUS

## 2011-08-11 MED ORDER — VERAPAMIL HCL 2.5 MG/ML IV SOLN
INTRAVENOUS | Status: AC
  Filled 2011-08-11: qty 2

## 2011-08-11 MED ORDER — ACETAMINOPHEN 325 MG PO TABS: 650.0000 mg | ORAL_TABLET | ORAL | Status: DC | PRN

## 2011-08-11 MED ORDER — ONDANSETRON HCL 4 MG/2ML IJ SOLN: 4.0000 mg | Freq: Four times a day (QID) | INTRAMUSCULAR | Status: DC | PRN

## 2011-08-11 MED ORDER — CLOPIDOGREL BISULFATE 300 MG PO TABS
ORAL_TABLET | ORAL | Status: AC
  Filled 2011-08-11: qty 2

## 2011-08-11 MED ORDER — CLOPIDOGREL BISULFATE 75 MG PO TABS
75.0000 mg | ORAL_TABLET | Freq: Every day | ORAL | Status: DC
  Administered 2011-08-12: 75 mg via ORAL
  Filled 2011-08-11: qty 1

## 2011-08-11 MED ORDER — HEPARIN BOLUS VIA INFUSION: 4000.0000 [IU] | Freq: Once | INTRAVENOUS | Status: DC

## 2011-08-11 NOTE — Procedures (Addendum)
CARDIAC CATH NOTE  Name: [name] MRN: [id number] DOB: December 31, 1950  Procedure: PTCA of the proximal LAD  Indication: 60 year old white male recently admitted and had a non-ST elevation myocardial infarction. Diagnostic cardiac catheterization earlier today showed a long subtotal occlusion of the proximal LAD with collateral flow from the right coronary. There was TIMI grade one flow. LV function initially on admission was severely depressed with an ejection fraction of 25%. By diagnostic cardiac catheterization today his LV function looked normal.  Procedural Details: The right wrist was prepped, draped, and anesthetized with 1% lidocaine. Using the modified Seldinger technique, a 6 Fr sheath was introduced into the radial artery. 3 mg verapamil was administered through the radial sheath. Weight-based bivalirudin was given for anticoagulation. Once a therapeutic ACT was achieved, a 6 [place] XB LAD guide catheter was inserted. This yielded poor backup support and we switched during the procedure to the left Voda 3.5 guide. This also yielded poor support. The best backup support we obtained was using a [place] guide. A [place] coronary guidewire was used .  We were unable to cross the lesion with this wire. We used multiple wires an attempt to cross the lesion including an intuition wire, propel wire, [employer] Brothers 3 wire, and grand slam wire. All were unsuccessful in crossing the lesion. This is particularly difficult because of poor visualization. He also had suboptimal guide support. There was a tendency for the wire to select the second diagonal and septal perforator branches prior to the mid LAD. We even used backup balloon support but were still able to cross the lesion. The final result was unchanged with a long 99% stenosis in the proximal to mid LAD with TIMI grade one flow. The patient tolerated the procedure well. There were no immediate procedural complications. A TR band was used for  radial hemostasis. The patient was transferred to the post catheterization recovery area for further monitoring.  Lesion Data: Vessel: The proximal LAD Percent stenosis (pre): 99% TIMI-flow (pre):  1 Stent:  N/A Percent stenosis (post): 99% TIMI-flow (post): 1  Conclusions: Unsuccessful angioplasty of the proximal to mid LAD due to inability to cross the lesion with the wire.  Recommendations: Considerations at this point would be to reattempt this lesion a later date from a femoral access to try and achieve better backup support or consideration for bypass surgery. I will discuss this with my colleagues and we'll determine future plans after discussion with patient.  Peter Swaziland MD 08/11/2011, 2:58 PM    I discussed with Drs Jens Som and Shirlee Latch. Given the excellent collaterals from the RCA and his recent medical problems it may be best to treat him medically for now. If signs/symptoms of recurrent ischemia we could reconsider revascularization options.  Peter [place]aziland MD

## 2011-08-11 NOTE — Interval H&P Note (Signed)
History and Physical Interval Note:   08/11/2011   10:18 AM   [name]  has presented today for surgery, with the diagnosis of NSTEMI  The various methods of treatment have been discussed with the patient and family. After consideration of risks, benefits and other options for treatment, the patient has consented to  Procedure(s): LEFT HEART CATHETERIZATION WITH CORONARY ANGIOGRAM as a surgical intervention .  The patients' history has been reviewed, patient examined, no change in status, stable for surgery.  I have reviewed the patients' chart and labs.  Questions were answered to the patient's satisfaction.     Marca Ancona  MD

## 2011-08-11 NOTE — Progress Notes (Signed)
[facility]  SUBJECTIVE:No complaints this am.  Patient is breathing well.  No chest pain.  Platelets improving.    Filed Vitals:   08/10/11 1928 08/10/11 2100 08/11/11 0129 08/11/11 0609  BP:  124/66  126/59  Pulse: 57 55 59 56  Temp:  97.6 F (36.4 C)  97.1 F (36.2 C)  TempSrc:  Oral  Oral  Resp: 18 18 18 18   Height:      Weight:    69.7 kg (153 lb 10.6 oz)  SpO2: 97% 100%  99%    Intake/Output Summary (Last 24 hours) at 08/11/11 0723 Last data filed at 08/10/11 2144  Gross per 24 hour  Intake    843 ml  Output    900 ml  Net    -57 ml    LABS: Basic Metabolic Panel:  Basename 08/11/11 0505 08/10/11 0530  NA 140 137  K 4.7 4.0  CL 103 101  CO2 30 29  GLUCOSE 90 86  BUN 26* 25*  CREATININE 1.20 1.10  CALCIUM 9.3 9.0  MG -- --  PHOS -- --   CBC:  Basename 08/11/11 0505 08/10/11 1043  WBC 12.4* 13.1*  NEUTROABS -- --  HGB 10.9* 10.7*  HCT 33.3* 32.5*  MCV 94.3 94.2  PLT 78* 59*    Current Facility-Administered Medications  Medication Dose Route Frequency Provider Last Rate Last Dose  . 0.9 %  sodium chloride infusion  250 mL Intravenous Continuous Wendall Stade, MD 1 mL/hr at 08/06/11 1415 250 mL at 08/06/11 1415  . 0.9 %  sodium chloride infusion   Intravenous Continuous Wendall Stade, MD 20 mL/hr at 08/07/11 0631 20 mL at 08/07/11 0631  . 0.9 %  sodium chloride infusion  250 mL Intravenous Continuous Lewayne Bunting, MD      . 0.9 %  sodium chloride infusion   Intravenous Continuous Lewayne Bunting, MD 20 mL/hr at 08/11/11 0556    . albuterol (PROVENTIL) (5 MG/ML) 0.5% nebulizer solution 2.5 mg  2.5 mg Nebulization Q3H PRN Dannielle Huh, PHARMD      . albuterol (PROVENTIL) (5 MG/ML) 0.5% nebulizer solution 2.5 mg  2.5 mg Nebulization Q6H Dannielle Huh, PHARMD   2.5 mg at 08/11/11 0131  . ALPRAZolam Prudy Feeler) tablet 0.25 mg  0.25 mg Oral Q8H PRN Anders Simmonds, NP      . amitriptyline (ELAVIL) tablet 25 mg  25 mg Oral QHS Meera  Fernand Parkins, PHARMD   25 mg at 08/10/11 2143  . aspirin chewable tablet 324 mg  324 mg Oral Pre-Cath Lewayne Bunting, MD   324 mg at 08/11/11 0555  . aspirin chewable tablet 81 mg  81 mg Oral Daily Lewayne Bunting, MD   81 mg at 08/10/11 1150  . B-complex with vitamin C tablet 1 tablet  1 tablet Oral Daily Dannielle Huh, PHARMD   1 tablet at 08/10/11 1000  . bisoprolol (ZEBETA) tablet 5 mg  5 mg Oral Daily Meera Fernand Parkins, PHARMD   5 mg at 08/10/11 1150  . diazepam (VALIUM) tablet 5 mg  5 mg Oral On Call Elson Clan, PHARMD      . digoxin (LANOXIN) tablet 0.125 mg  0.125 mg Oral Daily Rakesh V. Vassie Loll, MD   0.125 mg at 08/10/11 1149  . ipratropium (ATROVENT) nebulizer solution 0.5 mg  0.5 mg Nebulization Q3H PRN Dannielle Huh, PHARMD      . ipratropium (ATROVENT) nebulizer solution 0.5 mg  0.5 mg Nebulization Q6H Dannielle Huh, PHARMD   0.5 mg at 08/11/11 0130  . morphine (MS CONTIN) 12 hr tablet 15 mg  15 mg Oral Q12H Dannielle Huh, PHARMD   15 mg at 08/10/11 2143  . morphine 4 MG/ML injection 2 mg  2 mg Intravenous Q2H PRN Shan Levans, MD   2 mg at 08/11/11 8119  . multivitamins ther. w/minerals tablet 1 tablet  1 tablet Oral Daily Dannielle Huh, PHARMD   1 tablet at 08/10/11 1000  . pantoprazole (PROTONIX) EC tablet 40 mg  40 mg Oral Daily Severiano Gilbert, PHARMD   40 mg at 08/10/11 1208  . polyethylene glycol (MIRALAX / GLYCOLAX) packet 17 g  17 g Oral Daily Tad Moore [place], PHARMD   17 g at 08/10/11 1149  . predniSONE (DELTASONE) tablet 30 mg  30 mg Oral QAC breakfast Shan Levans, MD   30 mg at 08/10/11 0837  . simvastatin (ZOCOR) tablet 20 mg  20 mg Oral QHS Dannielle Huh, PHARMD   20 mg at 08/10/11 2143  . sodium chloride 0.9 % injection 3 mL  3 mL Intravenous Q12H Wendall Stade, MD   3 mL at 08/10/11 2144  . sodium chloride 0.9 % injection 3 mL  3 mL Intravenous PRN Wendall Stade, MD   3 mL at  08/09/11 2132  . sodium chloride 0.9 % injection 3 mL  3 mL Intravenous Q12H Lewayne Bunting, MD   3 mL at 08/10/11 2200  . sodium chloride 0.9 % injection 3 mL  3 mL Intravenous PRN Lewayne Bunting, MD      . DISCONTD: diazepam (VALIUM) tablet 5 mg  5 mg Oral On Call Lewayne Bunting, MD        PHYSICAL EXAM General: NAD Neck: No JVD, no thyromegaly or thyroid nodule.  Lungs: Occasional rhonchi, prolonged expiratory phase. CV: Nondisplaced PMI.  Heart regular S1/S2, no S3/S4, no murmur.  No peripheral edema.  No carotid bruit.   Abdomen: Soft, nontender, no hepatosplenomegaly, no distention.  Neurologic: Alert and oriented x 3.  Psych: Normal affect. Extremities: No clubbing or cyanosis.   TELEMETRY: Reviewed telemetry pt in sinus rhythm:  ASSESSMENT AND PLAN:  60 yo with history of PAD, AAA, COPD has had a long hospitalization with pneumococcal PNA/COPD exacerbation, NSTEMI, acute systolic CHF, ARF, and altered mental status.  He is now stable from the standpoint of most of these acute issues.  1. CAD: Patient's EF has fallen from 45 to 25%.  NSTEMI this admission.  He has extensive PAD.  Plan will be for left heart cath today.  Platelets are improving.  Continue ASA 81, bisoprolol, statin.  2. CHF: Acute systolic CHF, EF 14%.  Volume status improved.  Po Lasix held this morning for cath.  Continue bisoprolol.  Will need to start ACEI after cath.  3. COPD exacerbation/pneumococcal PNA: Patient has completed antibiotics and is getting prednisone taper.  4. Thrombocytopenia: Thought to be due to vancomycin.  Platelets rising, 78K today.  5. AAA: stable.  6. ARF: recovered. Will limit dye (no LV-gram).

## 2011-08-11 NOTE — H&P (View-Only) (Signed)
[facility]  SUBJECTIVE:No complaints this am.  Patient is breathing well.  No chest pain.  Platelets improving.    Filed Vitals:   08/10/11 1928 08/10/11 2100 08/11/11 0129 08/11/11 0609  BP:  124/66  126/59  Pulse: 57 55 59 56  Temp:  97.6 F (36.4 C)  97.1 F (36.2 C)  TempSrc:  Oral  Oral  Resp: 18 18 18 18  Height:      Weight:    69.7 kg (153 lb 10.6 oz)  SpO2: 97% 100%  99%    Intake/Output Summary (Last 24 hours) at 08/11/11 0723 Last data filed at 08/10/11 2144  Gross per 24 hour  Intake    843 ml  Output    900 ml  Net    -57 ml    LABS: Basic Metabolic Panel:  Basename 08/11/11 0505 08/10/11 0530  NA 140 137  K 4.7 4.0  CL 103 101  CO2 30 29  GLUCOSE 90 86  BUN 26* 25*  CREATININE 1.20 1.10  CALCIUM 9.3 9.0  MG -- --  PHOS -- --   CBC:  Basename 08/11/11 0505 08/10/11 1043  WBC 12.4* 13.1*  NEUTROABS -- --  HGB 10.9* 10.7*  HCT 33.3* 32.5*  MCV 94.3 94.2  PLT 78* 59*    Current Facility-Administered Medications  Medication Dose Route Frequency Provider Last Rate Last Dose  . 0.9 %  sodium chloride infusion  250 mL Intravenous Continuous Peter C Nishan, MD 1 mL/hr at 08/06/11 1415 250 mL at 08/06/11 1415  . 0.9 %  sodium chloride infusion   Intravenous Continuous Peter C Nishan, MD 20 mL/hr at 08/07/11 0631 20 mL at 08/07/11 0631  . 0.9 %  sodium chloride infusion  250 mL Intravenous Continuous Brian S Crenshaw, MD      . 0.9 %  sodium chloride infusion   Intravenous Continuous Brian S Crenshaw, MD 20 mL/hr at 08/11/11 0556    . albuterol (PROVENTIL) (5 MG/ML) 0.5% nebulizer solution 2.5 mg  2.5 mg Nebulization Q3H PRN Dustin George Zeigler, PHARMD      . albuterol (PROVENTIL) (5 MG/ML) 0.5% nebulizer solution 2.5 mg  2.5 mg Nebulization Q6H Dustin George Zeigler, PHARMD   2.5 mg at 08/11/11 0131  . ALPRAZolam (XANAX) tablet 0.25 mg  0.25 mg Oral Q8H PRN Pete Babcock, NP      . amitriptyline (ELAVIL) tablet 25 mg  25 mg Oral QHS Meera  Krishnavadan Patel, PHARMD   25 mg at 08/10/11 2143  . aspirin chewable tablet 324 mg  324 mg Oral Pre-Cath Brian S Crenshaw, MD   324 mg at 08/11/11 0555  . aspirin chewable tablet 81 mg  81 mg Oral Daily Brian S Crenshaw, MD   81 mg at 08/10/11 1150  . B-complex with vitamin C tablet 1 tablet  1 tablet Oral Daily Dustin George Zeigler, PHARMD   1 tablet at 08/10/11 1000  . bisoprolol (ZEBETA) tablet 5 mg  5 mg Oral Daily Meera Krishnavadan Patel, PHARMD   5 mg at 08/10/11 1150  . diazepam (VALIUM) tablet 5 mg  5 mg Oral On Call Andrea Michelle Lilliston, PHARMD      . digoxin (LANOXIN) tablet 0.125 mg  0.125 mg Oral Daily Rakesh V. Alva, MD   0.125 mg at 08/10/11 1149  . ipratropium (ATROVENT) nebulizer solution 0.5 mg  0.5 mg Nebulization Q3H PRN Dustin George Zeigler, PHARMD      . ipratropium (ATROVENT) nebulizer solution 0.5 mg    0.5 mg Nebulization Q6H Dustin George Zeigler, PHARMD   0.5 mg at 08/11/11 0130  . morphine (MS CONTIN) 12 hr tablet 15 mg  15 mg Oral Q12H Dustin George Zeigler, PHARMD   15 mg at 08/10/11 2143  . morphine 4 MG/ML injection 2 mg  2 mg Intravenous Q2H PRN Velma [name], MD   2 mg at 08/11/11 0609  . multivitamins ther. w/minerals tablet 1 tablet  1 tablet Oral Daily Dustin George Zeigler, PHARMD   1 tablet at 08/10/11 1000  . pantoprazole (PROTONIX) EC tablet 40 mg  40 mg Oral Daily Frank Rhea Wilson, PHARMD   40 mg at 08/10/11 1208  . polyethylene glycol (MIRALAX / GLYCOLAX) packet 17 g  17 g Oral Daily Dustin George Zeigler, PHARMD   17 g at 08/10/11 1149  . predniSONE (DELTASONE) tablet 30 mg  30 mg Oral QAC breakfast Andrej [name], MD   30 mg at 08/10/11 0837  . simvastatin (ZOCOR) tablet 20 mg  20 mg Oral QHS Dustin George Zeigler, PHARMD   20 mg at 08/10/11 2143  . sodium chloride 0.9 % injection 3 mL  3 mL Intravenous Q12H Peter C Nishan, MD   3 mL at 08/10/11 2144  . sodium chloride 0.9 % injection 3 mL  3 mL Intravenous PRN Peter C Nishan, MD   3 mL at  08/09/11 2132  . sodium chloride 0.9 % injection 3 mL  3 mL Intravenous Q12H Brian S Crenshaw, MD   3 mL at 08/10/11 2200  . sodium chloride 0.9 % injection 3 mL  3 mL Intravenous PRN Brian S Crenshaw, MD      . DISCONTD: diazepam (VALIUM) tablet 5 mg  5 mg Oral On Call Brian S Crenshaw, MD        PHYSICAL EXAM General: NAD Neck: No JVD, no thyromegaly or thyroid nodule.  Lungs: Occasional rhonchi, prolonged expiratory phase. CV: Nondisplaced PMI.  Heart regular S1/S2, no S3/S4, no murmur.  No peripheral edema.  No carotid bruit.   Abdomen: Soft, nontender, no hepatosplenomegaly, no distention.  Neurologic: Alert and oriented x 3.  Psych: Normal affect. Extremities: No clubbing or cyanosis.   TELEMETRY: Reviewed telemetry pt in sinus rhythm:  ASSESSMENT AND PLAN:  60 yo with history of PAD, AAA, COPD has had a long hospitalization with pneumococcal PNA/COPD exacerbation, NSTEMI, acute systolic CHF, ARF, and altered mental status.  He is now stable from the standpoint of most of these acute issues.  1. CAD: Patient's EF has fallen from 45 to 25%.  NSTEMI this admission.  He has extensive PAD.  Plan will be for left heart cath today.  Platelets are improving.  Continue ASA 81, bisoprolol, statin.  2. CHF: Acute systolic CHF, EF 25%.  Volume status improved.  Po Lasix held this morning for cath.  Continue bisoprolol.  Will need to start ACEI after cath.  3. COPD exacerbation/pneumococcal PNA: Patient has completed antibiotics and is getting prednisone taper.  4. Thrombocytopenia: Thought to be due to vancomycin.  Platelets rising, 78K today.  5. AAA: stable.  6. ARF: recovered. Will limit dye (no LV-gram).   

## 2011-08-11 NOTE — Procedures (Signed)
Cardiac Catheterization Procedure Note  Name: [name] MRN: [id number] DOB: Nov 07, 1950  Procedure: Left Heart Cath, Selective Coronary Angiography, LV angiography  Indication: MI, decreased LV systolic function.    Procedural Details: The right wrist was prepped, draped, and anesthetized with 1% lidocaine. Using the modified Seldinger technique, a 5 French sheath was introduced into the right radial artery. 3 mg of verapamil was administered through the sheath, weight-based unfractionated heparin was administered intravenously. Standard Judkins catheters were used for selective coronary angiography and left ventriculography (JR4, JL3.5). Catheter exchanges were performed over an exchange length guidewire. There were no immediate procedural complications. A TR band was used for radial hemostasis at the completion of the procedure.  The patient was transferred to the post catheterization recovery area for further monitoring.  Procedural Findings:  Hemodynamics:    AO 104/44    LV 112/7   Coronary angiography:   Coronary dominance: right    Left mainstem: Short vessel, no angiographic disease.     Left anterior descending (LAD): There was a moderate to large 1st diagonal with early take-off.  The proximal LAD just beyond the ostium of this diagonal had a long 90% stenosis with TIMI 2 flow.  The LAD was also collateralized by the RCA.     Left circumflex (LCx): The LCx was large in caliber with luminal irregularities.  There was a moderate to large PLOM.      Right coronary artery (RCA): 50% mid RCA stenosis.  The RCA gives collaterals to the LAD.    Left ventriculography: Hand ventriculogram was done.  LV EF appeared normal, estimate 55%, no definite wall motion abnormalities.   Final Conclusions:  Patient had an in-hospital MI with culprit lesion in the proximal LAD.  There is now a long 90% stenosis in the proximal LAD after the first diagonal.  This vessel may have been totally  occluded initially as there are collaterals from the RCA that have developed.  Hand ventriculogram was done, and EF looks like it has returned to near-normal.    Recommendations: I have discussed the films with Dr. [place].  We will plan PCI to LAD today.  I am going to load clopidogrel (history of questionable stroke and no insurance).   Marca Ancona 08/11/2011, 10:58 AM

## 2011-08-11 NOTE — Progress Notes (Signed)
60yo male smoker admitted 11/2 from [facility] fro a planned cardiac cath. He was originally admitted 10/29 forAMS r/t morphine use and possible ETOH detox. He was intubated x2, suffered ARF(resolved) and on 10/31 had NSTEMI. His EF dropped from 45% down to 25%. PMH stable AAA, COPD, alcoholic hepatitis  Low Plt d/t vancomycin  RIJ 10/31 >>11/6 ETT 10/29 >> 10/30, 11/1 >> 11/2  Cx sputum 10/29 >> strep pneumoniae Azactam 10/29 >>11/3 vanc 10/29 >> (note allergies) >>11/5 d/t low platelet 11/5 azithro>>>11/6   Renal - signed off,  ARI resolved vasc (dickson) - AAA >> no intervention required cards (L[facility]) Heme (murinson)  HPI/Events of Note afebrile, no dyspnea, Bp trend up.  Plt count better   Past History NEURO-migraines, left eye injury with right visual field blindness CVASC-hypertension requiring treatment; peripheral vascular disease; index PULM-COPD - moderate GI-Cirrhosis: encephalopathy; alcoholic hepatitis RHEUM-chronic back pain  Filed Vitals:   08/11/11 0609 08/11/11 0727 08/11/11 0916 08/11/11 1005  BP: 126/59  126/71   Pulse: 56  55 77  Temp: 97.1 F (36.2 C)  97 F (36.1 C)   TempSrc: Oral  Oral   Resp: 18  20   Height:      Weight: 69.7 kg (153 lb 10.6 oz)     SpO2: 99% 98% 100%      Physical Exam  NEURO-Mental Status: alert, Ox3 HEAD/NECK-Pupils: equal, react to light; Neck: JVD present, Mouth/Pharynx: Dry mucosa PULM-prolonged exhalation;distant bs,no wheezes  CVASC-S1 normal; S2 normal; systolic murmur; Pulses: normal GI-no pain on palpation; Bowel Sounds: decreased GU-foley catheter; External Genitalia: normal EXTREM-no edema;Perfusion: adequate CBC    Component Value Date/Time   WBC 11.2* 08/09/2011 0625   RBC 3.52* 08/09/2011 0625   HGB 10.9* 08/09/2011 0625   HCT 32.9* 08/09/2011 0625   PLT 22* 08/09/2011 0625>>>>PLT count better    MCV 93.5 08/09/2011 0625   MCH 31.0 08/09/2011 0625   MCHC 33.1 08/09/2011 0625   RDW 13.3 08/09/2011 0625   LYMPHSABS  2.3 08/08/2011 0510   MONOABS 0.6 08/08/2011 0510   EOSABS 0.1 08/08/2011 0510   BASOSABS 0.0 08/08/2011 0510    BMET    Component Value Date/Time   NA 140 08/11/2011 0505   K 4.7 08/11/2011 0505   CL 103 08/11/2011 0505   CO2 30 08/11/2011 0505   GLUCOSE 90 08/11/2011 0505   BUN 26* 08/11/2011 0505   CREATININE 1.20 08/11/2011 0505   CALCIUM 9.3 08/11/2011 0505   GFRNONAA 63* 08/11/2011 0505   GFRAA 73* 08/11/2011 0505   CXR 11/5: better aeration   Diagnoses acute COPD exacerbation: Resolved. No whezing D/C steroids. Begin Advair. Cont PRN albuterol  Encephalopathy - resolved  acute renal failure Resolved Hold lasix for cath 11/9  Pneumococal pneumonia Resolved. Off all abx  NSTEMI Holding lasix , ACE   Restart after cath per Cards Cath 11/9 >> LAD lesion. Planned PCA later today (11/9)  ABDOMINAL AORTIC ANEURYSM: Rpt CT for AAA in 6 mnths  Thrombocytopenia Heme feels is d/t vancomycin HIT panel  Neg Improving. Cont to monitor. Caution with antiplatelet agents  Hypokalemia resolved  Upon d/c will have to maximize Generic meds  From pulm perspective, he is ready to go home @ any time. Will discuss with Dr Shirlee Latch to see if Cards wants to assume his care  Billy Fischer, MD;  PCCM service; Mobile [phone]

## 2011-08-12 ENCOUNTER — Other Ambulatory Visit: Payer: Self-pay

## 2011-08-12 LAB — BASIC METABOLIC PANEL
CO2: 28 mEq/L (ref 19–32)
Chloride: 102 mEq/L (ref 96–112)
Creatinine, Ser: 1.15 mg/dL (ref 0.50–1.35)
Glucose, Bld: 86 mg/dL (ref 70–99)
Sodium: 139 mEq/L (ref 135–145)

## 2011-08-12 LAB — CBC
Hemoglobin: 9 g/dL — ABNORMAL LOW (ref 13.0–17.0)
MCV: 94.6 fL (ref 78.0–100.0)
Platelets: 105 10*3/uL — ABNORMAL LOW (ref 150–400)
RBC: 2.95 MIL/uL — ABNORMAL LOW (ref 4.22–5.81)
WBC: 10.1 10*3/uL (ref 4.0–10.5)

## 2011-08-12 MED ORDER — IPRATROPIUM BROMIDE 0.02 % IN SOLN: 0.5000 mg | Freq: Four times a day (QID) | RESPIRATORY_TRACT | Status: DC

## 2011-08-12 MED ORDER — ALPRAZOLAM 0.25 MG PO TABS: 0.2500 mg | ORAL_TABLET | Freq: Three times a day (TID) | ORAL | Status: DC | PRN

## 2011-08-12 MED ORDER — ISOSORBIDE DINITRATE 30 MG PO TABS: 15.0000 mg | ORAL_TABLET | Freq: Every day | ORAL | Status: DC

## 2011-08-12 MED ORDER — ALBUTEROL SULFATE (5 MG/ML) 0.5% IN NEBU: 2.5000 mg | INHALATION_SOLUTION | Freq: Four times a day (QID) | RESPIRATORY_TRACT | Status: DC

## 2011-08-12 MED ORDER — ACETAMINOPHEN 325 MG PO TABS: 650.0000 mg | ORAL_TABLET | ORAL | Status: AC | PRN

## 2011-08-12 MED ORDER — BISOPROLOL FUMARATE 5 MG PO TABS: 5.0000 mg | ORAL_TABLET | Freq: Every day | ORAL | Status: DC

## 2011-08-12 MED ORDER — DIGOXIN 125 MCG PO TABS: 0.1250 mg | ORAL_TABLET | Freq: Every day | ORAL | Status: DC

## 2011-08-12 MED ORDER — CLOPIDOGREL BISULFATE 75 MG PO TABS: 75.0000 mg | ORAL_TABLET | Freq: Every day | ORAL | Status: DC

## 2011-08-12 MED ORDER — MORPHINE SULFATE CR 15 MG PO TB12: 15.0000 mg | ORAL_TABLET | Freq: Two times a day (BID) | ORAL | Status: AC

## 2011-08-12 NOTE — Progress Notes (Signed)
CARDIAC REHAB PHASE I   PRE:  Rate/Rhythm: 49 sinus brady  BP:  Supine: 109/59  Sitting:   Standing:    SaO2: 98%  MODE:  Ambulation: 600 ft   POST:  Rate/Rhythem: 60 sinus rhythm  BP:  Supine:   Sitting: 107/56  Standing:    SaO2: 98%  8:00 -8:40am Order received and appreciated, chart reviewed and write up completed.  Pt ambulated in hallway x1 assist without difficulty, denies cp or doe.  Education completed.  Oriented to CRP II.  Pt accepting and requests referral to [facility].  Referral will be made.  Understanding verbalized-jrion,rn  Dan Europe

## 2011-08-12 NOTE — Discharge Summary (Signed)
Physician Discharge Summary  Patient ID: [name] MRN: [id number] DOB/AGE: Oct 30, 1950 60 y.o.  Admit date: 08/04/2011 Discharge date: 08/12/2011  Admission Diagnoses: Pneumonia, NSTEMI and heart failure.    Discharge Diagnoses:  Principal Problem:  *NSTEMI (non-ST elevation myocardial infarction) Active Problems:  CHRONIC OBSTRUCTIVE PULMONARY DISEASE, ACUTE EXACERBATION  Pulmonary edema with congestive heart failure  COPD  CAP (community acquired pneumonia)  BACK PAIN, LUMBAR  Lines and tubes and procedures  RIJ 10/31 >> out before discharge ETT 10/29 >> 10/30, 11/1 >> 11/2   Cardiac cath 11/9 Cultures sputum 10/29 >> strep pneumniae  Azactam 10/29 >>11/3  vanc 10/29 >> (note allergies)  Consults renal  vasc (dickson)  cards (cooper)  Brief history 60yo male smoker admitted 11/2 from [facility] fro a planned cardiac cath. He was originally  admitted 10/29 forAMS r/t morphine use and possible ETOH detox. He was intubated x2,  suffered ARF(resolved) and on 10/31 had NSTEMI. His EF dropped from 45% down to 25%.  PMH stable AAA, COPD, alcoholic hepatitis Low Plt d/t vancomycin  Hospital Course:  1)CAP (community acquired pneumonia) complicated by  CHRONIC OBSTRUCTIVE PULMONARY DISEASE, ACUTE EXACERBATION and Acute respiratory failure.  Initially admitted to [facility]. Intubated for respiratory failure and CAP. This was felt to be multi-factoral. Pneumonia and narcotic induced. He was extubated initially within 48 hrs however he developed acute respiratory distress after extubation.  He was initially treated with NIPPV and re-evaluation of cardiac and pulmonary status was carried out. This indeed demonstrated evidence of NSTEMI and progression of underlying systolic heart disease. His EF had dropped from 45-20%. He was again successfully extubated. At this point transferred to [facility] for cardiac evaluation. From a pneumonia stand-point he completed 8 days of antibiotic  treatment and also a prednisone taper. He will be discharged to home having completed both his prednisone and his antibiotics. He has been instructed to continue PRN and scheduled bronchodilators as well as follw up with [place] in our office.   2) NSTEMI (non-ST elevation myocardial infarction) with newly progressive  Pulmonary edema with congestive heart failure His EF dropped from 40s to 20%. Seen by cardiology. S/p cardiac cath. See reports in system.  Plan: Continue medical management  3)BACK PAIN, LUMBAR This was felt to complicate his initial presentation. Changes have been made to his plan of care. Specifically we have decreased his narcotic dosing.   4) history of abdominal aneurysm Seen by vascular. Felt to have no significant change.  Plan: Follow up with vascular in 6 months.   Discharge Exam: Temp:  [97 F (36.1 C)-98 F (36.7 C)] 97.5 F (36.4 C) (11/10 0755) Pulse Rate:  [32-81] 51  (11/10 0755) Resp:  [11-20] 11  (11/10 0755) BP: (94-133)/(52-71) 120/57 mmHg (11/10 0755) SpO2:  [94 %-100 %] 98 % (11/10 0755)  General appearance: NAD, conversant  Eyes: anicteric sclerae, moist conjunctivae. HENT: Atraumatic; oropharynx clear with moist mucous membranes and no mucosal ulcerations;  Neck: Trachea midline; FROM, supple, no thyromegaly or lymphadenopathy Lungs: CTA, with normal respiratory effort and no intercostal retractions CV: RRR, no MRGs  Abdomen: Soft, non-tender; no masses or HSM Extremities: No peripheral edema or extremity lymphadenopathy Skin: Normal temperature, turgor and texture; no rash, ulcers or subcutaneous nodules Psych: Appropriate affect, alert and oriented to person, place and time  Labs at discharge  Lab 08/12/11 0501 08/11/11 0505 08/10/11 1043 08/10/11 0530 08/09/11 0625 08/08/11 0510 08/06/11 0443 08/06/11 0011  NA 139 140 -- 137 143 141 -- --  K 4.2 4.7 -- -- -- -- -- --  CL 102 103 -- 101 105 104 -- --  CO2 28 30 -- 29 31 28  -- --  GLUCOSE  86 90 -- 86 93 90 -- --  BUN 26* 26* -- 25* 22 23 -- --  CREATININE 1.15 1.20 -- 1.10 1.24 1.14 -- --  CALCIUM 8.8 9.3 -- 9.0 8.8 8.5 -- --  MG -- -- -- -- -- -- -- --  PHOS -- -- -- -- -- -- -- --  AST -- -- -- -- -- 13 -- --  ALT -- -- -- -- -- 20 -- --  ALKPHOS -- -- -- -- -- 71 -- --  BILITOT -- -- -- -- -- 0.2* -- --  PROT -- -- -- -- -- 5.5* -- --  ALBUMIN -- -- -- -- -- 2.7* -- --  LIPASE -- -- -- -- -- -- -- --  AMYLASE -- -- -- -- -- -- -- --  AMMONIA -- -- -- -- -- -- -- --  WBC 10.1 12.4* 13.1* 13.1* 11.2* -- -- --  NEUTROABS -- -- -- -- -- 5.3 -- --  HGB 9.0* 10.9* 10.7* 11.1* 10.9* -- -- --  HCT 27.9* 33.3* 32.5* 33.5* 32.9* -- -- --  MCV 94.6 94.3 94.2 93.6 93.5 -- -- --  PLT 105* 78* 59* 55* 22* -- -- --  CKTOTAL -- -- -- -- -- -- -- --  CKMB -- -- -- -- -- -- -- --  CKMBINDEX -- -- -- -- -- -- -- --  TROPONINI -- -- -- -- -- -- -- --  GLUCAP -- -- -- -- -- -- 122* 190*    Current radiology studies PORTABLE CHEST - 1 VIEW  Comparison: 08/05/2011  Findings: Heart remains mildly enlarged. Right neck vascular catheter is stable. Patchy consolidation at the lung bases has improved. No pneumothorax. Vascular congestion has improved.  IMPRESSION: Improved bibasilar patchy airspace disease. Improved vascular Congestion.    Disposition:  Still a Patient  Discharge Orders    Future Appointments: Provider: Department: Dept Phone: Center:   09/22/2011 10:00 AM [facility] [phone] None   09/27/2011 2:15 PM Thomos Lemons, DO [facility] [phone] [facility]   01/31/2012 12:30 PM Chuck Hint, MD [facility] [phone] [facility]     Current Discharge Medication List    START taking these medications   Details  acetaminophen (TYLENOL) 325 MG tablet Take 2 tablets (650 mg total) by mouth every 4 (four) hours as needed. Qty: 30 tablet    albuterol (PROVENTIL) (5 MG/ML) 0.5% nebulizer solution Take 0.5 mLs (2.5 mg total)  by nebulization 4 (four) times daily. Qty: 20 mL, Refills: 6    ALPRAZolam (XANAX) 0.25 MG tablet Take 1 tablet (0.25 mg total) by mouth every 8 (eight) hours as needed for anxiety. Qty: 30 tablet, Refills: 0    bisoprolol (ZEBETA) 5 MG tablet Take 1 tablet (5 mg total) by mouth daily. Qty: 30 tablet, Refills: 6    clopidogrel (PLAVIX) 75 MG tablet Take 1 tablet (75 mg total) by mouth daily with breakfast. Qty: 30 tablet, Refills: 6    digoxin (LANOXIN) 0.125 MG tablet Take 1 tablet (0.125 mg total) by mouth daily. Qty: 30 tablet, Refills: 6    ipratropium (ATROVENT) 0.02 % nebulizer solution Take 2.5 mLs (0.5 mg total) by nebulization 4 (four) times daily. Qty: 75 mL, Refills: 6    isosorbide dinitrate (ISORDIL) 30 MG tablet Take 0.5 tablets (  15 mg total) by mouth daily. Qty: 30 tablet, Refills: 6      CONTINUE these medications which have CHANGED   Details  morphine (MS CONTIN) 15 MG 12 hr tablet Take 1 tablet (15 mg total) by mouth every 12 (twelve) hours. Qty: 60 tablet, Refills: 0      CONTINUE these medications which have NOT CHANGED   Details  albuterol (PROAIR HFA) 108 (90 BASE) MCG/ACT inhaler Inhale 2 puffs into the lungs every 4 (four) hours as needed. For shortness of breath.    albuterol (PROVENTIL) (2.5 MG/3ML) 0.083% nebulizer solution Take 2.5 mg by nebulization every 4 (four) hours as needed. For shortness of breath.    amitriptyline (ELAVIL) 25 MG tablet Take 25 mg by mouth at bedtime.      aspirin 325 MG tablet Take 325 mg by mouth daily.     b complex vitamins tablet Take 1 tablet by mouth daily.     multivitamin (THERAGRAN) per tablet Take 1 tablet by mouth daily.     polyethylene glycol (MIRALAX / GLYCOLAX) packet Take 17 g by mouth daily.     simvastatin (ZOCOR) 20 MG tablet Take 20 mg by mouth at bedtime.       STOP taking these medications     amLODipine-olmesartan (AZOR) 5-40 MG per tablet      cilostazol (PLETAL) 100 MG tablet       HYDROcodone-homatropine (HYCODAN) 5-1.5 MG/5ML syrup      ibuprofen (ADVIL,MOTRIN) 200 MG tablet      NON FORMULARY        Follow-up Information    Follow up with Shan Levans, MD. Make an appointment in 2 weeks.   Contact information:   [address] [facility] [address] [place] [address] [place] [address] [phone]          Discharged Condition: good  Signed: Marieanne Marxen,PETE 08/12/2011, 9:12 AM

## 2011-08-12 NOTE — Progress Notes (Signed)
@   Subjective:  Denies SSCP, palpitations or Dyspnea Wants to go home  Objective:  Vital Signs in the last 24 hours:       Wt Readings from Last 1 Encounters:  08/11/11 69.7 kg (153 lb 10.6 oz)    Temp:  [97 F (36.1 C)-98 F (36.7 C)] 97.6 F (36.4 C) (11/10 0513) Pulse Rate:  [32-81] 81  (11/10 0513) Resp:  [13-20] 13  (11/10 0513) BP: (94-133)/(52-71) 117/58 mmHg (11/10 0614) SpO2:  [94 %-100 %] 96 % (11/10 0614)  Intake/Output from previous day: 11/09 0701 - 11/10 0700 In: 880.2 [P.O.:340; I.V.:540.2] Out: 2100 [Urine:2100]  Physical Exam: No complaints.  S1/S2 PMI palpable mildly increased Decreased BS at base Right radial healed well Bilateral femoral bruits with decreased peripheral pulses Neuro nonfocal Skin warm and dry  Lab Results:  [facility] 08/12/11 0501 08/11/11 0505  WBC 10.1 12.4*  HGB 9.0* 10.9*  PLT 105* 78*    Basename 08/12/11 0501 08/11/11 0505  NA 139 140  K 4.2 4.7  CL 102 103  CO2 28 30  GLUCOSE 86 90  BUN 26* 26*  CREATININE 1.15 1.20   No results found for this basename: TROPONINI:2,CK,MB:2 in the last 72 hours Hepatic Function Panel No results found for this basename: PROT,ALBUMIN,AST,ALT,ALKPHOS,BILITOT,BILIDIR,IBILI in the last 72 hours No results found for this basename: CHOL in the last 72 hours No results found for this basename: PROTIME in the last 72 hours  Telemetry:  NSR   Medications:      . amitriptyline  25 mg Oral QHS  . B-complex with vitamin C  1 tablet Oral Daily  . bisoprolol  5 mg Oral Daily  . clopidogrel      . clopidogrel  600 mg Oral Once  . clopidogrel  75 mg Oral Q breakfast  . diazepam  5 mg Oral On Call  . digoxin  0.125 mg Oral Daily  . fentaNYL      . fentaNYL      . fentaNYL      . Fluticasone-Salmeterol  1 puff Inhalation BID  . heparin      . heparin      . midazolam      . midazolam      . midazolam      . morphine  15 mg Oral Q12H  . multivitamins ther. w/minerals  1 tablet Oral  Daily  . nitroGLYCERIN      . nitroGLYCERIN      . pantoprazole  40 mg Oral Daily  . polyethylene glycol  17 g Oral Daily  . simvastatin  20 mg Oral QHS  . verapamil      . DISCONTD: albuterol  2.5 mg Nebulization Q6H  . DISCONTD: aspirin  81 mg Oral Daily  . DISCONTD: heparin  4,000 Units Intravenous Once  . DISCONTD: ipratropium  0.5 mg Nebulization Q6H  . DISCONTD: predniSONE  30 mg Oral QAC breakfast  . DISCONTD: sodium chloride  3 mL Intravenous Q12H  . DISCONTD: sodium chloride  3 mL Intravenous Q12H   Assessment/Plan:   CAD:  Occluded LAD with failed angioplasty.  Per Dr [place] [facility].  Continue ASA and Plavix.  Add low dose nitrates.   Will arrange outpatient F/U with Dr Jens Som in the next 3-4 weeks.  Ok to D/C home today. Add Imdur 15mg  daily  [name] 08/12/2011, 7:55 AM

## 2011-08-14 ENCOUNTER — Encounter (HOSPITAL_COMMUNITY): Payer: Self-pay

## 2011-08-14 LAB — POCT ACTIVATED CLOTTING TIME: Activated Clotting Time: 155 seconds

## 2011-08-14 MED FILL — Sodium Chloride IV Soln 0.9%: INTRAVENOUS | Qty: 100 | Status: AC

## 2011-08-14 MED FILL — Bivalirudin Trifluoroacetate For IV Soln 250 MG (Base Equiv): INTRAVENOUS | Qty: 500 | Status: AC

## 2011-08-17 ENCOUNTER — Encounter: Payer: Self-pay | Admitting: Critical Care Medicine

## 2011-08-17 ENCOUNTER — Ambulatory Visit (INDEPENDENT_AMBULATORY_CARE_PROVIDER_SITE_OTHER): Payer: Self-pay | Admitting: Critical Care Medicine

## 2011-08-17 ENCOUNTER — Ambulatory Visit (HOSPITAL_BASED_OUTPATIENT_CLINIC_OR_DEPARTMENT_OTHER)
Admission: RE | Admit: 2011-08-17 | Discharge: 2011-08-17 | Disposition: A | Discharge status: Home / Self Care | Payer: Medicaid Other | Source: Ambulatory Visit | Attending: Critical Care Medicine | Admitting: Critical Care Medicine

## 2011-08-17 ENCOUNTER — Other Ambulatory Visit: Payer: Self-pay | Admitting: Critical Care Medicine

## 2011-08-17 VITALS — BP 110/82 | HR 56 | Temp 97.4°F | Ht 67.0 in | Wt 152.0 lb

## 2011-08-17 DIAGNOSIS — J4489 Other specified chronic obstructive pulmonary disease: Secondary | ICD-10-CM

## 2011-08-17 DIAGNOSIS — I252 Old myocardial infarction: Secondary | ICD-10-CM | POA: Insufficient documentation

## 2011-08-17 DIAGNOSIS — I509 Heart failure, unspecified: Secondary | ICD-10-CM | POA: Insufficient documentation

## 2011-08-17 DIAGNOSIS — I2510 Atherosclerotic heart disease of native coronary artery without angina pectoris: Secondary | ICD-10-CM

## 2011-08-17 DIAGNOSIS — J449 Chronic obstructive pulmonary disease, unspecified: Secondary | ICD-10-CM

## 2011-08-17 NOTE — Assessment & Plan Note (Signed)
FeV1 33%  FVC 57% Fef25 75 58%  08/17/11 Golds III COPD Ex smoker as of 10/12 Recent acute respiratory failure with vent supp d/t CAP Strep Pneumoniae assoc NonSTEMI with LAD lesion, unable to be stented Plan Medical management of CAD>>>f/u with Cardiology in two weeks D/c research inhaler Stay on neb med albuterol/ipratroprium d/t cost issues No oxygen needed May refer to pulm rehab later after cardiology clearance Pt needs to stay off cigarettes, we counselled the pt for 5 min today

## 2011-08-17 NOTE — Progress Notes (Signed)
Quick Note:  Notify the patient that the Xray is stable and no pneumonia No change in medications are recommended. Continue current meds as prescribed at last office visit ______ 
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
## 2011-08-17 NOTE — Patient Instructions (Addendum)
Stay on nebulizer 4 times daily (can occasionally do this three times daily out of convenience) No other medication changes Chest xray and labs today Keep cardiology appt and f/u with Dr Artist Pais Return [place] two months Stay off smoking Stay off research inhaler for now, the research RN will call you

## 2011-08-18 ENCOUNTER — Other Ambulatory Visit: Payer: Self-pay | Admitting: *Deleted

## 2011-08-18 LAB — BASIC METABOLIC PANEL
CO2: 24 mEq/L (ref 19–32)
Calcium: 9.1 mg/dL (ref 8.4–10.5)
Chloride: 106 mEq/L (ref 96–112)
Glucose, Bld: 86 mg/dL (ref 70–99)
Potassium: 5.4 mEq/L — ABNORMAL HIGH (ref 3.5–5.3)
Sodium: 139 mEq/L (ref 135–145)

## 2011-08-18 LAB — CBC WITH DIFFERENTIAL/PLATELET
Lymphocytes Relative: 12 % (ref 12–46)
Lymphs Abs: 0.8 10*3/uL (ref 0.7–4.0)
Monocytes Relative: 9 % (ref 3–12)
Neutro Abs: 5.5 10*3/uL (ref 1.7–7.7)
Neutrophils Relative %: 77 % (ref 43–77)
Platelets: 237 10*3/uL (ref 150–400)
RBC: 3.02 MIL/uL — ABNORMAL LOW (ref 4.22–5.81)
WBC: 7.2 10*3/uL (ref 4.0–10.5)

## 2011-08-18 MED ORDER — POLYSACCHARIDE IRON COMPLEX 150 MG PO CAPS: 150.0000 mg | ORAL_CAPSULE | Freq: Two times a day (BID) | ORAL | Status: DC

## 2011-08-18 NOTE — Progress Notes (Signed)
Quick Note:  Call pt and tell him labs are ok, but he is anemic. He needs to start Iron supplement. Call in NuIron 150mg  bid for the pt to start. ______

## 2011-08-22 NOTE — Consult Note (Signed)
  NAMELIONARDO, HAZE                ACCOUNT NO.:  [id number]  MEDICAL RECORD NO.:  [id number]  LOCATION:  1223                         FACILITY:  [facility]  PHYSICIAN:  Mindi Slicker. Lowell Guitar, M.D.  DATE OF BIRTH:  1951-02-22  DATE OF CONSULTATION: DATE OF DISCHARGE:                                CONSULTATION   I have asked by Dr. Craige Cotta to see this 60 year old male with renal failure, who presented today with altered mental status, apparently from fall and has a history of chronic opioid usage.  Patient was seen in the emergency room today and was found to have a blood pressure of 111/55, 105/45, heart rate 92 to 104.  Patient takes Azor presumably for high blood pressure.  Creatinine in 2008, was 1.2 mg/dL and upon admission, creatinine was 3.46 mg/dL with a BUN of 61.  In the emergency room, patient responded to Narcan with increased level of consciousness, but subsequently became more agitated and reportedly developed respiratory acidosis; pH down to 7.1 requiring intubation.  Nephrology consultation was requested to assist with management of his renal failure.  PAST HISTORY:  Remarkable for: 1. Chronic alcoholism. 2. COPD. 3. Back pain. 4. Cigarette abuse. 5. History of abdominal aortic aneurysm. 6. Hypertension. 7. Peripheral vascular disease.  SOCIAL HISTORY:  Unobtainable from patient.  FAMILY HISTORY:  Unobtainable from patient.  REVIEW OF SYSTEMS:  Unobtainable from patient.  HOME MEDICATIONS:  Include Azor, Pletal, hydrocodone, aspirin, Elavil, albuterol, MS Contin, Zocor, and amlodipine.  PHYSICAL EXAMINATION:  GENERAL:  Patient is an intubated Caucasian male. SKIN:  Turgor is adequate.  Color of the skin is pink and well- arterialized. HEENT:  Neck veins are flat. LUNGS:  Clear with slightly decreased breath sounds. HEART:  Regular rhythm and rate. ABDOMEN:  Soft and scaphoid. EXTREMITIES:  With no edema.  The patient is well-sedated and there is no obvious  focalities.  Sodium 132, potassium 5.5, chloride 100, CO2 of 19, BUN 61, creatinine 3.42, albumin 3.6.  Aspirin level is within normal range.  Urinalysis specific gravity 1.016, pH 5.  No protein.  Arterial blood gas, PO2 144, PCO2 63, pH 7.108, prior to intubation.  Chest x-ray is mild congestion.  CT abdomen, abdominal aortic aneurysm, bilateral iliac approximately 5 cm, bilateral iliac aneurysm.  No hydronephrosis.  Renal vascular calcifications, question cyst in the right kidney, small calculi in both kidneys.  ASSESSMENT: 1. Renal failure, probably acute, but patient at-high risk for chronic     kidney disease with his atherosclerosis,  arteriosclerosis, and     risk factors. 2. Hyperkalemia.  RECOMMENDATIONS: 1. I agree with supportive therapy including volume expansion as     tolerated. 2. Kayexalate by NG tube. 3. Renal ultrasound to assess renal size.          ______________________________ Mindi Slicker. Lowell Guitar, M.D.     ACP/MEDQ  D:  07/31/2011  T:  07/31/2011  Job:  [id number]  Electronically Signed by Casimiro Needle M.D. on 08/22/2011 07:32:07 AM

## 2011-08-31 ENCOUNTER — Ambulatory Visit (INDEPENDENT_AMBULATORY_CARE_PROVIDER_SITE_OTHER): Payer: Self-pay | Admitting: Cardiology

## 2011-08-31 ENCOUNTER — Telehealth: Payer: Self-pay | Admitting: Internal Medicine

## 2011-08-31 ENCOUNTER — Encounter: Payer: Self-pay | Admitting: Cardiology

## 2011-08-31 DIAGNOSIS — I10 Essential (primary) hypertension: Secondary | ICD-10-CM

## 2011-08-31 DIAGNOSIS — F17200 Nicotine dependence, unspecified, uncomplicated: Secondary | ICD-10-CM

## 2011-08-31 DIAGNOSIS — I6529 Occlusion and stenosis of unspecified carotid artery: Secondary | ICD-10-CM

## 2011-08-31 DIAGNOSIS — I714 Abdominal aortic aneurysm, without rupture, unspecified: Secondary | ICD-10-CM

## 2011-08-31 DIAGNOSIS — I679 Cerebrovascular disease, unspecified: Secondary | ICD-10-CM

## 2011-08-31 DIAGNOSIS — I2510 Atherosclerotic heart disease of native coronary artery without angina pectoris: Secondary | ICD-10-CM

## 2011-08-31 DIAGNOSIS — E785 Hyperlipidemia, unspecified: Secondary | ICD-10-CM

## 2011-08-31 DIAGNOSIS — I739 Peripheral vascular disease, unspecified: Secondary | ICD-10-CM

## 2011-08-31 DIAGNOSIS — I428 Other cardiomyopathies: Secondary | ICD-10-CM

## 2011-08-31 DIAGNOSIS — I701 Atherosclerosis of renal artery: Secondary | ICD-10-CM

## 2011-08-31 NOTE — Assessment & Plan Note (Addendum)
Patient's LAD could not be crossed. Plan medical therapy for now. Continue aspirin, beta blocker, nitrates and statin. LV function was reduced in the hospital. This may have been related to his acute illness. Repeat echocardiogram to reassess LV function.

## 2011-08-31 NOTE — Patient Instructions (Signed)
Your physician recommends that you schedule a follow-up appointment in: 3 MONTHS  Your physician has requested that you have a carotid duplex. This test is an ultrasound of the carotid arteries in your neck. It looks at blood flow through these arteries that supply the brain with blood. Allow one hour for this exam. There are no restrictions or special instructions.   Your physician has requested that you have an echocardiogram. Echocardiography is a painless test that uses sound waves to create images of your heart. It provides your doctor with information about the size and shape of your heart and how well your heart's chambers and valves are working. This procedure takes approximately one hour. There are no restrictions for this procedure.   FOLLOW UP WITH DR CHRIS DICKSON FOR AAA  BRING ALL MEDICINE TO NEXT OFFICE VISIT

## 2011-08-31 NOTE — Assessment & Plan Note (Addendum)
Continue statin. 

## 2011-08-31 NOTE — Assessment & Plan Note (Signed)
Refer back to vascular surgery. He will need this repaired in the future.
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
## 2011-08-31 NOTE — Assessment & Plan Note (Signed)
Continue aspirin and statin. Followup carotid Dopplers. 

## 2011-08-31 NOTE — Assessment & Plan Note (Signed)
Blood pressure controlled. Continue present medications. 

## 2011-08-31 NOTE — Assessment & Plan Note (Signed)
The patient has discontinued. 

## 2011-08-31 NOTE — Telephone Encounter (Signed)
Was given Alprazolam 0.25mg  in the hospital. Pt is requesting a new rx to be filled at [facility]. Please call patient when done. Thanks.

## 2011-08-31 NOTE — Assessment & Plan Note (Signed)
Appointment made with vascular surgery.

## 2011-08-31 NOTE — Assessment & Plan Note (Signed)
Patient has chronic claudication. Continue present medications. He has discontinued his tobacco use. Vascular surgery will also see for this issue.

## 2011-09-01 MED ORDER — ALPRAZOLAM 0.25 MG PO TABS: 0.2500 mg | ORAL_TABLET | Freq: Three times a day (TID) | ORAL | Status: DC | PRN

## 2011-09-01 NOTE — Telephone Encounter (Signed)
Ok to refill x 1. Needs OV before additional refills.

## 2011-09-01 NOTE — Telephone Encounter (Signed)
rx called in, pt aware 

## 2011-09-11 ENCOUNTER — Telehealth: Payer: Self-pay

## 2011-09-11 DIAGNOSIS — I714 Abdominal aortic aneurysm, without rupture: Secondary | ICD-10-CM

## 2011-09-11 NOTE — Telephone Encounter (Signed)
Rec'd request per B. Roux, receptionist to clarify order for CT ABD/Pelvis to f/u on AAA  In 6 mos.  Per Record-A-Phone record of 10/31, rec'd order per Dr. Edilia Bo to schedule "CT scan in 6 mos to f/u on 4.5 cm AAA".  [facility] called to question if this should be a CTA ABD/Pelvis vs CT ABD/Pelvis?  Discussed w/ Dr. Edilia Bo.  Rec'd verbal order to schedule CTA abd/pelvis at 6 mo interval from hospital consult.  Order placed in EPIC.

## 2011-09-27 ENCOUNTER — Ambulatory Visit (INDEPENDENT_AMBULATORY_CARE_PROVIDER_SITE_OTHER): Payer: Self-pay | Admitting: Internal Medicine

## 2011-09-27 ENCOUNTER — Encounter: Payer: Self-pay | Admitting: Internal Medicine

## 2011-09-27 DIAGNOSIS — I701 Atherosclerosis of renal artery: Secondary | ICD-10-CM

## 2011-09-27 DIAGNOSIS — I10 Essential (primary) hypertension: Secondary | ICD-10-CM

## 2011-09-27 DIAGNOSIS — M545 Low back pain: Secondary | ICD-10-CM

## 2011-09-27 DIAGNOSIS — I2510 Atherosclerotic heart disease of native coronary artery without angina pectoris: Secondary | ICD-10-CM

## 2011-09-27 DIAGNOSIS — D649 Anemia, unspecified: Secondary | ICD-10-CM | POA: Insufficient documentation

## 2011-09-27 DIAGNOSIS — E785 Hyperlipidemia, unspecified: Secondary | ICD-10-CM

## 2011-09-27 MED ORDER — MORPHINE SULFATE 15 MG PO TABS: 15.0000 mg | ORAL_TABLET | Freq: Two times a day (BID) | ORAL | Status: AC | PRN

## 2011-09-27 MED ORDER — ALPRAZOLAM 0.25 MG PO TABS: 0.2500 mg | ORAL_TABLET | Freq: Three times a day (TID) | ORAL | Status: DC | PRN

## 2011-09-27 MED ORDER — MORPHINE SULFATE 15 MG PO TABS: 15.0000 mg | ORAL_TABLET | Freq: Two times a day (BID) | ORAL | Status: DC

## 2011-09-27 MED ORDER — SIMVASTATIN 20 MG PO TABS: 20.0000 mg | ORAL_TABLET | Freq: Every day | ORAL | Status: DC

## 2011-09-27 NOTE — Assessment & Plan Note (Signed)
Stable.  Repeat BMET.  Consider add low dose ARB consider EF of 20-25 %  BP: 122/70 mmHg

## 2011-09-27 NOTE — Assessment & Plan Note (Signed)
Patient hospitalized his previous office visit. There is note of acute mental status change which may have been partly caused by his chronic use of pain medications. I agree with lowering dose.

## 2011-09-27 NOTE — Assessment & Plan Note (Signed)
Normocytic anemia of unclear etiology.  FOBT during hospitalization was negative. Check iron studies and B12 level. Patient currently taking iron supplement. Lab Results  Component Value Date   WBC 7.2 08/17/2011   HGB 9.2* 08/17/2011   HCT 29.2* 08/17/2011   MCV 96.7 08/17/2011   PLT 237 08/17/2011
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
## 2011-09-27 NOTE — Assessment & Plan Note (Signed)
Management as per cardiology

## 2011-09-27 NOTE — Assessment & Plan Note (Signed)
Goal LDL < 70.  Consider increasing simvastatin dose.  Lab Results  Component Value Date   CHOL 198 11/16/2010   HDL 48 11/16/2010   LDLCALC 108* 11/16/2010   TRIG 211* 11/16/2010   CHOLHDL 4.1 Ratio 11/16/2010

## 2011-09-29 ENCOUNTER — Encounter (INDEPENDENT_AMBULATORY_CARE_PROVIDER_SITE_OTHER): Payer: Self-pay | Admitting: *Deleted

## 2011-09-29 ENCOUNTER — Ambulatory Visit (HOSPITAL_COMMUNITY): Payer: Medicaid Other | Attending: Internal Medicine | Admitting: Radiology

## 2011-09-29 DIAGNOSIS — E785 Hyperlipidemia, unspecified: Secondary | ICD-10-CM | POA: Insufficient documentation

## 2011-09-29 DIAGNOSIS — I6529 Occlusion and stenosis of unspecified carotid artery: Secondary | ICD-10-CM

## 2011-09-29 DIAGNOSIS — I10 Essential (primary) hypertension: Secondary | ICD-10-CM | POA: Insufficient documentation

## 2011-09-29 DIAGNOSIS — I428 Other cardiomyopathies: Secondary | ICD-10-CM | POA: Insufficient documentation

## 2011-09-29 DIAGNOSIS — I2510 Atherosclerotic heart disease of native coronary artery without angina pectoris: Secondary | ICD-10-CM | POA: Insufficient documentation

## 2011-10-13 ENCOUNTER — Telehealth: Payer: Self-pay | Admitting: Critical Care Medicine

## 2011-10-13 ENCOUNTER — Ambulatory Visit (INDEPENDENT_AMBULATORY_CARE_PROVIDER_SITE_OTHER): Payer: Self-pay | Admitting: Internal Medicine

## 2011-10-13 ENCOUNTER — Encounter: Payer: Self-pay | Admitting: Internal Medicine

## 2011-10-13 DIAGNOSIS — J449 Chronic obstructive pulmonary disease, unspecified: Secondary | ICD-10-CM

## 2011-10-13 MED ORDER — HYDROCODONE-HOMATROPINE 5-1.5 MG/5ML PO SYRP: 5.0000 mL | ORAL_SOLUTION | ORAL | Status: AC | PRN

## 2011-10-13 MED ORDER — PREDNISONE (PAK) 10 MG PO TABS: ORAL_TABLET | ORAL | Status: AC

## 2011-10-13 MED ORDER — BUDESONIDE-FORMOTEROL FUMARATE 160-4.5 MCG/ACT IN AERO: INHALATION_SPRAY | RESPIRATORY_TRACT | Status: DC

## 2011-10-13 MED ORDER — DOXYCYCLINE HYCLATE 100 MG PO TABS: 100.0000 mg | ORAL_TABLET | Freq: Two times a day (BID) | ORAL | Status: DC

## 2011-10-13 NOTE — Telephone Encounter (Signed)
Error.[name] ° °
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
## 2011-10-13 NOTE — Patient Instructions (Addendum)
Prednisone 10 mg take  4 each am x 2 days,   2 each am x 2 days,  1 each am x2days and stop  Doxycycline 100 mg twice daily before eating with glass of water  Try symbicort 160 Take 2 puffs first thing in am and then another 2 puffs about 12 hours later.    Try to reduce your dependence on your nebulizer as you improve with the goal of twice weekly at most but ok to use it up to 4 x daily if needed  Hycodan cough syrup as needed  Keep appt to see Dr Delford Field in [facility]

## 2011-10-14 NOTE — Assessment & Plan Note (Signed)
GOLD III COPD at baseline and exacerbating again on a regimen that lacks ICS/LABA prob related to his inability to pay  Rec trial of symbicort 160 Take 2 puffs first thing in am and then another 2 puffs about 12 hours later.    and if effective at reducing exac and use of saba than maybe he can get it through the company's program  > defer final decision on maint rx to Dr Delford Field  In meantime, rx with short course of pred / doxy    Each maintenance medication was reviewed in detail including most importantly the difference between maintenance and as needed and under what circumstances the prns are to be used.  Please see instructions for details which were reviewed in writing and the patient given a copy.

## 2011-10-16 ENCOUNTER — Encounter: Payer: Self-pay | Admitting: Critical Care Medicine

## 2011-10-16 ENCOUNTER — Ambulatory Visit (INDEPENDENT_AMBULATORY_CARE_PROVIDER_SITE_OTHER): Payer: Self-pay | Admitting: Critical Care Medicine

## 2011-10-16 DIAGNOSIS — J449 Chronic obstructive pulmonary disease, unspecified: Secondary | ICD-10-CM

## 2011-10-16 MED ORDER — MOXIFLOXACIN HCL 400 MG PO TABS: 400.0000 mg | ORAL_TABLET | Freq: Every day | ORAL | Status: AC

## 2011-10-16 NOTE — Assessment & Plan Note (Signed)
FeV1 33%  FVC 57% Fef25 75 58%  08/17/11 Golds III COPD Ex smoker as of 10/12  Plan Cont symbicort D/c doxy Start avelox 400/d Stay off cigarettes
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
## 2011-10-16 NOTE — Patient Instructions (Signed)
Stop doxycycline Start avelox one daily, use samples No other medication change Return 6 weeks

## 2011-10-17 ENCOUNTER — Encounter: Payer: Self-pay | Admitting: Vascular Surgery

## 2011-10-18 ENCOUNTER — Other Ambulatory Visit: Payer: Medicaid Other

## 2011-10-18 ENCOUNTER — Encounter: Payer: Self-pay | Admitting: Vascular Surgery

## 2011-10-18 ENCOUNTER — Ambulatory Visit (INDEPENDENT_AMBULATORY_CARE_PROVIDER_SITE_OTHER): Payer: Medicaid Other | Admitting: Vascular Surgery

## 2011-10-18 VITALS — BP 145/72 | HR 53 | Resp 16 | Ht 67.0 in | Wt 158.0 lb

## 2011-10-18 DIAGNOSIS — I711 Thoracic aortic aneurysm, ruptured, unspecified: Secondary | ICD-10-CM | POA: Insufficient documentation

## 2011-10-18 DIAGNOSIS — I714 Abdominal aortic aneurysm, without rupture, unspecified: Secondary | ICD-10-CM

## 2011-10-18 DIAGNOSIS — I6529 Occlusion and stenosis of unspecified carotid artery: Secondary | ICD-10-CM
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
## 2011-11-22 DIAGNOSIS — G8929 Other chronic pain: Secondary | ICD-10-CM | POA: Insufficient documentation

## 2011-11-22 DIAGNOSIS — M545 Low back pain, unspecified: Secondary | ICD-10-CM | POA: Insufficient documentation

## 2011-11-22 DIAGNOSIS — I739 Peripheral vascular disease, unspecified: Secondary | ICD-10-CM | POA: Insufficient documentation

## 2011-11-22 DIAGNOSIS — F419 Anxiety disorder, unspecified: Secondary | ICD-10-CM | POA: Insufficient documentation

## 2011-11-27 ENCOUNTER — Encounter: Payer: Self-pay | Admitting: Critical Care Medicine

## 2011-11-27 ENCOUNTER — Encounter: Payer: Self-pay | Admitting: Internal Medicine

## 2011-11-27 ENCOUNTER — Ambulatory Visit (INDEPENDENT_AMBULATORY_CARE_PROVIDER_SITE_OTHER): Payer: Self-pay | Admitting: Critical Care Medicine

## 2011-11-27 ENCOUNTER — Ambulatory Visit (INDEPENDENT_AMBULATORY_CARE_PROVIDER_SITE_OTHER): Payer: Self-pay | Admitting: Internal Medicine

## 2011-11-27 VITALS — BP 146/82 | HR 72 | Temp 97.9°F | Wt 159.5 lb

## 2011-11-27 VITALS — BP 120/76 | HR 71 | Temp 97.8°F | Ht 67.0 in | Wt 157.0 lb

## 2011-11-27 DIAGNOSIS — J449 Chronic obstructive pulmonary disease, unspecified: Secondary | ICD-10-CM

## 2011-11-27 DIAGNOSIS — I2510 Atherosclerotic heart disease of native coronary artery without angina pectoris: Secondary | ICD-10-CM

## 2011-11-27 DIAGNOSIS — M545 Low back pain: Secondary | ICD-10-CM

## 2011-11-27 DIAGNOSIS — I10 Essential (primary) hypertension: Secondary | ICD-10-CM

## 2011-11-27 MED ORDER — LOSARTAN POTASSIUM 25 MG PO TABS: 25.0000 mg | ORAL_TABLET | Freq: Every day | ORAL | Status: DC

## 2011-11-27 MED ORDER — BUDESONIDE-FORMOTEROL FUMARATE 160-4.5 MCG/ACT IN AERO: INHALATION_SPRAY | RESPIRATORY_TRACT | Status: DC

## 2011-11-27 MED ORDER — ALPRAZOLAM 0.25 MG PO TABS: 0.2500 mg | ORAL_TABLET | Freq: Three times a day (TID) | ORAL | Status: AC | PRN

## 2011-11-27 MED ORDER — MORPHINE SULFATE 15 MG PO TABS: 15.0000 mg | ORAL_TABLET | Freq: Two times a day (BID) | ORAL | Status: DC

## 2011-11-27 NOTE — Patient Instructions (Signed)
Please complete the following lab tests within 2 weeks of starting new blood pressure medication: BMET - 401.9 CBC - 285.9
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
## 2011-11-27 NOTE — Patient Instructions (Signed)
Resume symbicort, use samples No other medication changes Return 3 months

## 2011-11-27 NOTE — Assessment & Plan Note (Signed)
Pt reports pain is suboptimally controlled.  I suggest he add tylenol 500 mg bid prn.  I would like to avoid overmedicating with narcotics given severity of COPD and cardiomyopathy.

## 2011-11-27 NOTE — Assessment & Plan Note (Signed)
BP is suboptimally controlled.  Add losartan.  Correction to previous note - EF 35-40%.

## 2011-11-27 NOTE — Assessment & Plan Note (Signed)
Severe golds D. COPD , recent smoking cessation Plan Maintain inhaled medications as prescribed Return 3 months

## 2011-11-27 NOTE — Assessment & Plan Note (Signed)
Add losartan 25 mg.  Arrange BMET within 2 weeks. 2D Echo 09/2011- Left ventricle: LVEF is approximately 35 to 40% with akinesis of the distal LV. The cavity size was normal. Wall thickness was increased in a pattern of moderate LVH. Doppler parameters are consistent with abnormal left ventricular relaxation (grade 1 diastolic dysfunction). - Left atrium: The atrium was moderately dilated. - Pulmonary arteries: PA peak pressure: 34mm Hg (S).
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
## 2011-11-29 ENCOUNTER — Ambulatory Visit: Payer: Self-pay | Admitting: Internal Medicine

## 2011-11-30 ENCOUNTER — Encounter: Payer: Self-pay | Admitting: Cardiology

## 2011-11-30 ENCOUNTER — Ambulatory Visit (INDEPENDENT_AMBULATORY_CARE_PROVIDER_SITE_OTHER): Payer: Medicaid Other | Admitting: Cardiology

## 2011-11-30 VITALS — BP 140/60 | HR 53 | Ht 67.0 in | Wt 159.0 lb

## 2011-11-30 DIAGNOSIS — I10 Essential (primary) hypertension: Secondary | ICD-10-CM

## 2011-11-30 DIAGNOSIS — I701 Atherosclerosis of renal artery: Secondary | ICD-10-CM

## 2011-11-30 DIAGNOSIS — I2510 Atherosclerotic heart disease of native coronary artery without angina pectoris: Secondary | ICD-10-CM

## 2011-11-30 DIAGNOSIS — I714 Abdominal aortic aneurysm, without rupture: Secondary | ICD-10-CM

## 2011-11-30 DIAGNOSIS — E785 Hyperlipidemia, unspecified: Secondary | ICD-10-CM

## 2011-11-30 MED ORDER — LISINOPRIL 2.5 MG PO TABS: 2.5000 mg | ORAL_TABLET | Freq: Every day | ORAL | Status: DC
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
## 2011-11-30 NOTE — Patient Instructions (Signed)
Your physician recommends that you schedule a follow-up appointment in: 3 MONTHS  Your physician recommends that you return for lab work in: Monday 12-04-11 ANYTIME AFTER 8:30AM  START LISINOPRIL 2.5 MG ONCE DAILY

## 2011-11-30 NOTE — Assessment & Plan Note (Signed)
Abdominal ultrasounds are now being followed by vascular surgery.

## 2011-11-30 NOTE — Assessment & Plan Note (Signed)
Blood pressure controlled. Continue present medications. 

## 2011-11-30 NOTE — Assessment & Plan Note (Signed)
Patient with significant claudication. Followed by vascular surgery.

## 2011-11-30 NOTE — Assessment & Plan Note (Addendum)
Continue aspirin and statin. Followed by vascular surgery. 

## 2011-11-30 NOTE — Assessment & Plan Note (Signed)
Will continue statin. Check lipids and liver.

## 2011-11-30 NOTE — Assessment & Plan Note (Signed)
Continue aspirin and statin. Patient has an ischemic cardiomyopathy. Continue beta blocker. I will add lisinopril 2.5 mg daily. He does have renal artery stenosis on previous ultrasound. This is bilateral. His renal function has been normal. We will follow his renal function closely and if he deteriorates discontinue his ACE inhibitor and begin hydralazine along with his nitrates.

## 2011-12-01 ENCOUNTER — Telehealth: Payer: Self-pay | Admitting: Cardiology

## 2011-12-01 NOTE — Telephone Encounter (Signed)
Spoke with pt wife, when the pt was seen by dr Jens Som he was given lisinopril, he had already been started on losartan 25 mg by dr Artist Pais. They want to know from dr Jens Som which one would be the best to take. Will forward for dr Jens Som review. Will send order over for cardiac rehab for the pt

## 2011-12-01 NOTE — Telephone Encounter (Signed)
Please return call to patient wife Charlette [phone]  Patient wife would like to speak with nurse regarding patient medications as pharmacist said he should not be taking medications together at this point.  Wife can be reached at hm#.

## 2011-12-01 NOTE — Telephone Encounter (Signed)
Dc lisinopril and continue losartan with fu labs as previously scheduled Olga Millers

## 2011-12-01 NOTE — Telephone Encounter (Signed)
Spoke with pt wife,  

## 2011-12-04 ENCOUNTER — Other Ambulatory Visit (INDEPENDENT_AMBULATORY_CARE_PROVIDER_SITE_OTHER): Payer: Medicaid Other

## 2011-12-04 DIAGNOSIS — I714 Abdominal aortic aneurysm, without rupture: Secondary | ICD-10-CM

## 2011-12-04 DIAGNOSIS — E785 Hyperlipidemia, unspecified: Secondary | ICD-10-CM

## 2011-12-04 DIAGNOSIS — I701 Atherosclerosis of renal artery: Secondary | ICD-10-CM

## 2011-12-04 DIAGNOSIS — I10 Essential (primary) hypertension: Secondary | ICD-10-CM

## 2011-12-04 DIAGNOSIS — I2510 Atherosclerotic heart disease of native coronary artery without angina pectoris: Secondary | ICD-10-CM

## 2011-12-04 LAB — BASIC METABOLIC PANEL
CO2: 29 mEq/L (ref 19–32)
Calcium: 8.9 mg/dL (ref 8.4–10.5)
Chloride: 105 mEq/L (ref 96–112)
Glucose, Bld: 96 mg/dL (ref 70–99)
Potassium: 4.9 mEq/L (ref 3.5–5.1)
Sodium: 138 mEq/L (ref 135–145)

## 2011-12-04 LAB — HEPATIC FUNCTION PANEL
ALT: 9 U/L (ref 0–53)
Bilirubin, Direct: 0 mg/dL (ref 0.0–0.3)
Total Bilirubin: 0.2 mg/dL — ABNORMAL LOW (ref 0.3–1.2)

## 2011-12-04 LAB — LIPID PANEL
HDL: 31.7 mg/dL — ABNORMAL LOW (ref >39.00)
Total CHOL/HDL Ratio: 4
Triglycerides: 254 mg/dL — ABNORMAL HIGH (ref 0.0–149.0)
VLDL: 50.8 mg/dL — ABNORMAL HIGH (ref 0.0–40.0)

## 2011-12-05 LAB — LDL CHOLESTEROL, DIRECT: Direct LDL: 64.3 mg/dL

## 2011-12-05 NOTE — Telephone Encounter (Signed)
Spoke with pt, he reports he never picked up the losartan and he is only taking the lisinopril. Labs were drawn yesterday.

## 2011-12-08 ENCOUNTER — Telehealth: Payer: Self-pay | Admitting: Cardiology

## 2011-12-08 ENCOUNTER — Encounter: Payer: Self-pay | Admitting: *Deleted

## 2011-12-08 NOTE — Telephone Encounter (Signed)
Patient returning nurse call, he can be reached at [phone] for return call

## 2011-12-08 NOTE — Telephone Encounter (Signed)
Spoke with pt, aware labs were normal, copy mailed to his home

## 2011-12-12 ENCOUNTER — Other Ambulatory Visit: Payer: Self-pay

## 2011-12-21 ENCOUNTER — Encounter (HOSPITAL_COMMUNITY): Payer: Self-pay

## 2011-12-21 ENCOUNTER — Encounter (HOSPITAL_COMMUNITY)
Admission: RE | Admit: 2011-12-21 | Discharge: 2011-12-21 | Disposition: A | Discharge status: Home / Self Care | Payer: Medicaid Other | Source: Ambulatory Visit | Attending: Cardiology | Admitting: Cardiology

## 2011-12-21 DIAGNOSIS — I2510 Atherosclerotic heart disease of native coronary artery without angina pectoris: Secondary | ICD-10-CM | POA: Insufficient documentation

## 2011-12-21 DIAGNOSIS — I739 Peripheral vascular disease, unspecified: Secondary | ICD-10-CM | POA: Insufficient documentation

## 2011-12-21 DIAGNOSIS — I10 Essential (primary) hypertension: Secondary | ICD-10-CM | POA: Insufficient documentation

## 2011-12-21 DIAGNOSIS — I711 Thoracic aortic aneurysm, ruptured, unspecified: Secondary | ICD-10-CM | POA: Insufficient documentation

## 2011-12-21 DIAGNOSIS — J449 Chronic obstructive pulmonary disease, unspecified: Secondary | ICD-10-CM | POA: Insufficient documentation

## 2011-12-21 DIAGNOSIS — Z5189 Encounter for other specified aftercare: Secondary | ICD-10-CM | POA: Insufficient documentation

## 2011-12-21 DIAGNOSIS — I6529 Occlusion and stenosis of unspecified carotid artery: Secondary | ICD-10-CM | POA: Insufficient documentation

## 2011-12-21 DIAGNOSIS — R011 Cardiac murmur, unspecified: Secondary | ICD-10-CM | POA: Insufficient documentation

## 2011-12-21 DIAGNOSIS — H349 Unspecified retinal vascular occlusion: Secondary | ICD-10-CM | POA: Insufficient documentation

## 2011-12-21 DIAGNOSIS — I714 Abdominal aortic aneurysm, without rupture, unspecified: Secondary | ICD-10-CM | POA: Insufficient documentation

## 2011-12-21 DIAGNOSIS — E785 Hyperlipidemia, unspecified: Secondary | ICD-10-CM | POA: Insufficient documentation

## 2011-12-21 DIAGNOSIS — I701 Atherosclerosis of renal artery: Secondary | ICD-10-CM | POA: Insufficient documentation

## 2011-12-21 DIAGNOSIS — J4489 Other specified chronic obstructive pulmonary disease: Secondary | ICD-10-CM | POA: Insufficient documentation

## 2011-12-21 DIAGNOSIS — I252 Old myocardial infarction: Secondary | ICD-10-CM | POA: Insufficient documentation

## 2011-12-21 DIAGNOSIS — I679 Cerebrovascular disease, unspecified: Secondary | ICD-10-CM | POA: Insufficient documentation

## 2011-12-21 NOTE — Progress Notes (Signed)
Cardiac Rehab Medication Review by a Pharmacist  Does the patient  feel that his/her medications are working for him/her?  yes  Has the patient been experiencing any side effects to the medications prescribed?  no  Does the patient measure his/her own blood pressure or blood glucose at home?  yes -once in a while  Does the patient have any problems obtaining medications due to transportation or finances?   no  Understanding of regimen: good Understanding of indications: fair Potential of compliance: good -uses a pillbox    [name] 12/21/2011 8:14 AM

## 2011-12-24 ENCOUNTER — Other Ambulatory Visit: Payer: Self-pay | Admitting: Internal Medicine

## 2011-12-25 ENCOUNTER — Encounter (HOSPITAL_COMMUNITY): Payer: Self-pay

## 2011-12-25 ENCOUNTER — Other Ambulatory Visit: Payer: Self-pay | Admitting: Internal Medicine

## 2011-12-25 ENCOUNTER — Encounter (HOSPITAL_COMMUNITY)
Admission: RE | Admit: 2011-12-25 | Discharge: 2011-12-25 | Disposition: A | Discharge status: Home / Self Care | Payer: Medicaid Other | Source: Ambulatory Visit | Attending: Cardiology | Admitting: Cardiology

## 2011-12-25 NOTE — Progress Notes (Signed)
Pt started cardiac rehab today.  Pt tolerated light exercise without difficulty.  VSS.  Telemetry-sinus bradycardia at rest, occasional PVC with exercise.  Pt asymptomatic.  Pt oriented to exercise equipment and routine.  Understanding verbalized.  Will continue to monitor the patient throughout  the program.

## 2011-12-27 ENCOUNTER — Encounter (HOSPITAL_COMMUNITY)
Admission: RE | Admit: 2011-12-27 | Discharge: 2011-12-27 | Disposition: A | Discharge status: Home / Self Care | Payer: Medicaid Other | Source: Ambulatory Visit | Attending: Cardiology | Admitting: Cardiology

## 2011-12-27 NOTE — Progress Notes (Signed)
Pt quality of life assessment reviewed, low scores are related to pt recent MI.  Pt is concerned because he does not have the stamina or breath to participate in activities that he finds pleasurable. Pt states he has not discussed this concern with his cardiologist but plans to at next office visit if not improved.  Advised pt of need to notify MD sooner if sx are worsening, not improving.  Pt denies sx at cardiac rehab. Pt reports positive family support and denies depression.  Will continue to monitor

## 2011-12-29 ENCOUNTER — Encounter (HOSPITAL_COMMUNITY)
Admission: RE | Admit: 2011-12-29 | Discharge: 2011-12-29 | Disposition: A | Discharge status: Home / Self Care | Payer: Medicaid Other | Source: Ambulatory Visit | Attending: Cardiology | Admitting: Cardiology

## 2012-01-01 ENCOUNTER — Encounter (HOSPITAL_COMMUNITY)
Admission: RE | Admit: 2012-01-01 | Discharge: 2012-01-01 | Disposition: A | Discharge status: Home / Self Care | Payer: Medicaid Other | Source: Ambulatory Visit | Attending: Cardiology | Admitting: Cardiology

## 2012-01-01 DIAGNOSIS — I6529 Occlusion and stenosis of unspecified carotid artery: Secondary | ICD-10-CM | POA: Insufficient documentation

## 2012-01-01 DIAGNOSIS — J449 Chronic obstructive pulmonary disease, unspecified: Secondary | ICD-10-CM | POA: Insufficient documentation

## 2012-01-01 DIAGNOSIS — I2510 Atherosclerotic heart disease of native coronary artery without angina pectoris: Secondary | ICD-10-CM | POA: Insufficient documentation

## 2012-01-01 DIAGNOSIS — I711 Thoracic aortic aneurysm, ruptured, unspecified: Secondary | ICD-10-CM | POA: Insufficient documentation

## 2012-01-01 DIAGNOSIS — I739 Peripheral vascular disease, unspecified: Secondary | ICD-10-CM | POA: Insufficient documentation

## 2012-01-01 DIAGNOSIS — I714 Abdominal aortic aneurysm, without rupture, unspecified: Secondary | ICD-10-CM | POA: Insufficient documentation

## 2012-01-01 DIAGNOSIS — I701 Atherosclerosis of renal artery: Secondary | ICD-10-CM | POA: Insufficient documentation

## 2012-01-01 DIAGNOSIS — H349 Unspecified retinal vascular occlusion: Secondary | ICD-10-CM | POA: Insufficient documentation

## 2012-01-01 DIAGNOSIS — Z5189 Encounter for other specified aftercare: Secondary | ICD-10-CM | POA: Insufficient documentation

## 2012-01-01 DIAGNOSIS — E785 Hyperlipidemia, unspecified: Secondary | ICD-10-CM | POA: Insufficient documentation

## 2012-01-01 DIAGNOSIS — J4489 Other specified chronic obstructive pulmonary disease: Secondary | ICD-10-CM | POA: Insufficient documentation

## 2012-01-01 DIAGNOSIS — I679 Cerebrovascular disease, unspecified: Secondary | ICD-10-CM | POA: Insufficient documentation

## 2012-01-01 DIAGNOSIS — I252 Old myocardial infarction: Secondary | ICD-10-CM | POA: Insufficient documentation

## 2012-01-01 DIAGNOSIS — I10 Essential (primary) hypertension: Secondary | ICD-10-CM | POA: Insufficient documentation

## 2012-01-01 DIAGNOSIS — R011 Cardiac murmur, unspecified: Secondary | ICD-10-CM | POA: Insufficient documentation

## 2012-01-03 ENCOUNTER — Encounter (HOSPITAL_COMMUNITY)
Admission: RE | Admit: 2012-01-03 | Discharge: 2012-01-03 | Disposition: A | Discharge status: Home / Self Care | Payer: Medicaid Other | Source: Ambulatory Visit | Attending: Cardiology | Admitting: Cardiology

## 2012-01-05 ENCOUNTER — Encounter (HOSPITAL_COMMUNITY)
Admission: RE | Admit: 2012-01-05 | Discharge: 2012-01-05 | Disposition: A | Discharge status: Home / Self Care | Payer: Medicaid Other | Source: Ambulatory Visit | Attending: Cardiology | Admitting: Cardiology
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
## 2012-01-08 ENCOUNTER — Encounter (HOSPITAL_COMMUNITY)
Admission: RE | Admit: 2012-01-08 | Discharge: 2012-01-08 | Disposition: A | Discharge status: Home / Self Care | Payer: Medicaid Other | Source: Ambulatory Visit | Attending: Cardiology | Admitting: Cardiology

## 2012-01-10 ENCOUNTER — Encounter (HOSPITAL_COMMUNITY)
Admission: RE | Admit: 2012-01-10 | Discharge: 2012-01-10 | Disposition: A | Discharge status: Home / Self Care | Payer: Medicaid Other | Source: Ambulatory Visit | Attending: Cardiology | Admitting: Cardiology

## 2012-01-12 ENCOUNTER — Encounter (HOSPITAL_COMMUNITY)
Admission: RE | Admit: 2012-01-12 | Discharge: 2012-01-12 | Disposition: A | Discharge status: Home / Self Care | Payer: Medicaid Other | Source: Ambulatory Visit | Attending: Cardiology | Admitting: Cardiology

## 2012-01-15 ENCOUNTER — Encounter (HOSPITAL_COMMUNITY)
Admission: RE | Admit: 2012-01-15 | Discharge: 2012-01-15 | Disposition: A | Discharge status: Home / Self Care | Payer: Medicaid Other | Source: Ambulatory Visit | Attending: Cardiology | Admitting: Cardiology

## 2012-01-17 ENCOUNTER — Encounter (HOSPITAL_COMMUNITY)
Admission: RE | Admit: 2012-01-17 | Discharge: 2012-01-17 | Disposition: A | Discharge status: Home / Self Care | Payer: Medicaid Other | Source: Ambulatory Visit | Attending: Cardiology | Admitting: Cardiology

## 2012-01-19 ENCOUNTER — Encounter (HOSPITAL_COMMUNITY)
Admission: RE | Admit: 2012-01-19 | Discharge: 2012-01-19 | Disposition: A | Discharge status: Home / Self Care | Payer: Medicaid Other | Source: Ambulatory Visit | Attending: Cardiology | Admitting: Cardiology

## 2012-01-22 ENCOUNTER — Encounter (HOSPITAL_COMMUNITY)
Admission: RE | Admit: 2012-01-22 | Discharge: 2012-01-22 | Disposition: A | Discharge status: Home / Self Care | Payer: Medicaid Other | Source: Ambulatory Visit | Attending: Cardiology | Admitting: Cardiology

## 2012-01-22 ENCOUNTER — Ambulatory Visit (INDEPENDENT_AMBULATORY_CARE_PROVIDER_SITE_OTHER): Payer: Medicaid Other | Admitting: Internal Medicine

## 2012-01-22 ENCOUNTER — Encounter: Payer: Self-pay | Admitting: Internal Medicine

## 2012-01-22 VITALS — BP 142/68 | HR 76 | Temp 97.9°F | Ht 67.0 in | Wt 158.0 lb

## 2012-01-22 DIAGNOSIS — M545 Low back pain: Secondary | ICD-10-CM

## 2012-01-22 DIAGNOSIS — I10 Essential (primary) hypertension: Secondary | ICD-10-CM

## 2012-01-22 MED ORDER — LOSARTAN POTASSIUM 50 MG PO TABS: 50.0000 mg | ORAL_TABLET | Freq: Every day | ORAL | Status: DC

## 2012-01-22 MED ORDER — ALPRAZOLAM 0.25 MG PO TABS: 0.2500 mg | ORAL_TABLET | Freq: Three times a day (TID) | ORAL | Status: DC | PRN

## 2012-01-22 MED ORDER — MORPHINE SULFATE 15 MG PO TABS: 15.0000 mg | ORAL_TABLET | Freq: Two times a day (BID) | ORAL | Status: DC

## 2012-01-22 NOTE — Assessment & Plan Note (Signed)
The patient recently started on ACE inhibitor. He has mild wheezing on exam. I suggest with the ARB.
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
## 2012-01-22 NOTE — Assessment & Plan Note (Signed)
No change.  Continue same meds.

## 2012-01-24 ENCOUNTER — Encounter (HOSPITAL_COMMUNITY)
Admission: RE | Admit: 2012-01-24 | Discharge: 2012-01-24 | Disposition: A | Discharge status: Home / Self Care | Payer: Medicaid Other | Source: Ambulatory Visit | Attending: Cardiology | Admitting: Cardiology

## 2012-01-26 ENCOUNTER — Encounter (HOSPITAL_COMMUNITY)
Admission: RE | Admit: 2012-01-26 | Discharge: 2012-01-26 | Disposition: A | Discharge status: Home / Self Care | Payer: Medicaid Other | Source: Ambulatory Visit | Attending: Cardiology | Admitting: Cardiology

## 2012-01-29 ENCOUNTER — Encounter (HOSPITAL_COMMUNITY)
Admission: RE | Admit: 2012-01-29 | Discharge: 2012-01-29 | Disposition: A | Discharge status: Home / Self Care | Payer: Medicaid Other | Source: Ambulatory Visit | Attending: Cardiology | Admitting: Cardiology

## 2012-01-31 ENCOUNTER — Ambulatory Visit: Payer: 59 | Admitting: Vascular Surgery

## 2012-01-31 ENCOUNTER — Encounter (HOSPITAL_COMMUNITY)
Admission: RE | Admit: 2012-01-31 | Discharge: 2012-01-31 | Disposition: A | Discharge status: Home / Self Care | Payer: Medicaid Other | Source: Ambulatory Visit | Attending: Cardiology | Admitting: Cardiology

## 2012-01-31 ENCOUNTER — Telehealth: Payer: Self-pay | Admitting: *Deleted

## 2012-01-31 DIAGNOSIS — J4489 Other specified chronic obstructive pulmonary disease: Secondary | ICD-10-CM | POA: Insufficient documentation

## 2012-01-31 DIAGNOSIS — Z5189 Encounter for other specified aftercare: Secondary | ICD-10-CM | POA: Insufficient documentation

## 2012-01-31 DIAGNOSIS — I252 Old myocardial infarction: Secondary | ICD-10-CM | POA: Insufficient documentation

## 2012-01-31 DIAGNOSIS — I2510 Atherosclerotic heart disease of native coronary artery without angina pectoris: Secondary | ICD-10-CM | POA: Insufficient documentation

## 2012-01-31 DIAGNOSIS — I10 Essential (primary) hypertension: Secondary | ICD-10-CM | POA: Insufficient documentation

## 2012-01-31 DIAGNOSIS — I711 Thoracic aortic aneurysm, ruptured, unspecified: Secondary | ICD-10-CM | POA: Insufficient documentation

## 2012-01-31 DIAGNOSIS — R011 Cardiac murmur, unspecified: Secondary | ICD-10-CM | POA: Insufficient documentation

## 2012-01-31 DIAGNOSIS — I701 Atherosclerosis of renal artery: Secondary | ICD-10-CM | POA: Insufficient documentation

## 2012-01-31 DIAGNOSIS — E785 Hyperlipidemia, unspecified: Secondary | ICD-10-CM | POA: Insufficient documentation

## 2012-01-31 DIAGNOSIS — I714 Abdominal aortic aneurysm, without rupture, unspecified: Secondary | ICD-10-CM | POA: Insufficient documentation

## 2012-01-31 DIAGNOSIS — I739 Peripheral vascular disease, unspecified: Secondary | ICD-10-CM | POA: Insufficient documentation

## 2012-01-31 DIAGNOSIS — I679 Cerebrovascular disease, unspecified: Secondary | ICD-10-CM | POA: Insufficient documentation

## 2012-01-31 DIAGNOSIS — J449 Chronic obstructive pulmonary disease, unspecified: Secondary | ICD-10-CM | POA: Insufficient documentation

## 2012-01-31 DIAGNOSIS — I6529 Occlusion and stenosis of unspecified carotid artery: Secondary | ICD-10-CM | POA: Insufficient documentation

## 2012-01-31 DIAGNOSIS — H349 Unspecified retinal vascular occlusion: Secondary | ICD-10-CM | POA: Insufficient documentation

## 2012-01-31 NOTE — Progress Notes (Signed)
Talked with Dt Yoo's office . Dr Artist Pais wants [name] to follow up with cardiology. Patient given an appointment to follow up with [name] tomorrow at 11:00. Patient left without complaints of SOB exit heart rate 59.  Patient know to seek medical attention if symptoms return.  Exit SAO2  99% on room air.

## 2012-01-31 NOTE — Telephone Encounter (Signed)
Nurse called from Cardiac Rehab pt is experiencing SOB with exertion, SAT 92%, BP 162/60, pt is wheezing.  Weight has increase but he does not have any swelling.  At rest SAT 99%, BP 132/60.  Per Dr Artist Pais have pt see Cardiology and if they can't he will see pt on Friday.  Gladstone Lighter, RN is aware and pt has appt with Cardiology on 02/01/12.  Nurse faxed over information for Dr Artist Pais to look at.

## 2012-01-31 NOTE — Progress Notes (Signed)
Patient complained of extreme shortness of breath today.  Exercise stopped on the nustep patient assisted to chair SAO2 92% on Room Air blood pressure 162/60.  Upon assessment lung fields with expiratory wheezes throughout all lung fields. Patient denied any chest pain.  Telemetry rhythm sinus tach 110.  Kerney's SOB resolved with rest. Weight 74.2 kg.  Dr Olegario Messier office called and notified of today's events and weight.

## 2012-02-01 ENCOUNTER — Telehealth: Payer: Self-pay | Admitting: *Deleted

## 2012-02-01 ENCOUNTER — Other Ambulatory Visit: Payer: Self-pay | Admitting: *Deleted

## 2012-02-01 ENCOUNTER — Other Ambulatory Visit (INDEPENDENT_AMBULATORY_CARE_PROVIDER_SITE_OTHER): Payer: Medicaid Other

## 2012-02-01 ENCOUNTER — Ambulatory Visit (INDEPENDENT_AMBULATORY_CARE_PROVIDER_SITE_OTHER)
Admission: RE | Admit: 2012-02-01 | Discharge: 2012-02-01 | Disposition: A | Discharge status: Home / Self Care | Payer: Medicaid Other | Source: Ambulatory Visit | Attending: Physician Assistant | Admitting: Physician Assistant

## 2012-02-01 ENCOUNTER — Encounter: Payer: Self-pay | Admitting: Physician Assistant

## 2012-02-01 ENCOUNTER — Ambulatory Visit (INDEPENDENT_AMBULATORY_CARE_PROVIDER_SITE_OTHER): Payer: Medicaid Other | Admitting: Physician Assistant

## 2012-02-01 VITALS — BP 108/54 | HR 55 | Ht 67.0 in | Wt 164.0 lb

## 2012-02-01 DIAGNOSIS — R0602 Shortness of breath: Secondary | ICD-10-CM

## 2012-02-01 DIAGNOSIS — I2510 Atherosclerotic heart disease of native coronary artery without angina pectoris: Secondary | ICD-10-CM

## 2012-02-01 DIAGNOSIS — I255 Ischemic cardiomyopathy: Secondary | ICD-10-CM

## 2012-02-01 DIAGNOSIS — I2589 Other forms of chronic ischemic heart disease: Secondary | ICD-10-CM

## 2012-02-01 LAB — BASIC METABOLIC PANEL
CO2: 27 mEq/L (ref 19–32)
Calcium: 9.3 mg/dL (ref 8.4–10.5)
Creatinine, Ser: 1.2 mg/dL (ref 0.4–1.5)
GFR: 64.97 mL/min (ref >60.00)

## 2012-02-01 LAB — CBC WITH DIFFERENTIAL/PLATELET
Basophils Absolute: 0 10*3/uL (ref 0.0–0.1)
Basophils Relative: 0.4 % (ref 0.0–3.0)
Eosinophils Absolute: 0.1 10*3/uL (ref 0.0–0.7)
Lymphocytes Relative: 16.1 % (ref 12.0–46.0)
MCHC: 34.1 g/dL (ref 30.0–36.0)
Monocytes Relative: 6.6 % (ref 3.0–12.0)
Neutrophils Relative %: 75.2 % (ref 43.0–77.0)
RBC: 3.97 Mil/uL — ABNORMAL LOW (ref 4.22–5.81)
RDW: 15.1 % — ABNORMAL HIGH (ref 11.5–14.6)

## 2012-02-01 MED ORDER — DOXYCYCLINE HYCLATE 100 MG PO TBEC: 100.0000 mg | DELAYED_RELEASE_TABLET | Freq: Two times a day (BID) | ORAL | Status: DC

## 2012-02-01 MED ORDER — PREDNISONE (PAK) 10 MG PO TABS: 10.0000 mg | ORAL_TABLET | ORAL | Status: AC

## 2012-02-01 MED ORDER — DOXYCYCLINE HYCLATE 100 MG PO TABS: 100.0000 mg | ORAL_TABLET | Freq: Two times a day (BID) | ORAL | Status: AC

## 2012-02-01 MED ORDER — PREDNISONE (PAK) 10 MG PO TABS: ORAL_TABLET | ORAL | Status: DC

## 2012-02-01 NOTE — Telephone Encounter (Signed)
REPEAT CXR ORDER PLACED TODAY TO BE DONE 5/3 @ [facility] CXR WITH NIPPLE MARKERS

## 2012-02-01 NOTE — Patient Instructions (Addendum)
Your physician recommends that you schedule a follow-up appointment in: PLEASE KEEP FOLLOW UP WITH DR. CRENSHAW  PLEASE GO OVER TO THE [facility] OFFICE FOR A CHEST XRAY TODAY  LABS TODAY, BMET, CBC W/DIFF, BNP  A PREDNISONE PAK 10 MG HAS BEEN SENT IN TODAY FOR YOU AS WELL AS DOXYCYCLINE  USE NEBULIZER 3 TIMES DAILY FOR 3 DAYS THEN AS NEEDED  PLEASE MAKE APPT TO SEE DR. RAMASWAMY 02/02/12 @ 3 PM

## 2012-02-01 NOTE — Telephone Encounter (Signed)
rx sent in for doxycycline 100

## 2012-02-01 NOTE — Telephone Encounter (Signed)
Message copied by Tarri Fuller on Thu Feb 01, 2012  5:31 PM ------      Message from: [place], [place] T      Created: Thu Feb 01, 2012  3:39 PM       Hgb ok      BNP elevated but much lower than previous        - no edema on CXR, BUN elevated        - do not think CHF cause of dyspnea        - take antibxs and follow up with pulmonary as planned        - call over the next few days if dyspnea not getting any better - can try low dose Lasix at that time if still short of breath      K+ on high side        - decrease dietary K+        - repeat BMET on Monday 02/05/12      Tereso Newcomer, PA-C  3:38 PM 02/01/2012
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
## 2012-02-02 ENCOUNTER — Ambulatory Visit (INDEPENDENT_AMBULATORY_CARE_PROVIDER_SITE_OTHER)
Admission: RE | Admit: 2012-02-02 | Discharge: 2012-02-02 | Disposition: A | Discharge status: Home / Self Care | Payer: Medicaid Other | Source: Ambulatory Visit | Attending: Physician Assistant | Admitting: Physician Assistant

## 2012-02-02 ENCOUNTER — Ambulatory Visit (INDEPENDENT_AMBULATORY_CARE_PROVIDER_SITE_OTHER): Payer: Medicaid Other | Admitting: Internal Medicine

## 2012-02-02 ENCOUNTER — Encounter (HOSPITAL_COMMUNITY)
Admission: RE | Admit: 2012-02-02 | Discharge: 2012-02-02 | Disposition: A | Discharge status: Home / Self Care | Payer: Medicaid Other | Source: Ambulatory Visit | Attending: Cardiology | Admitting: Cardiology

## 2012-02-02 ENCOUNTER — Encounter: Payer: Self-pay | Admitting: Internal Medicine

## 2012-02-02 VITALS — BP 138/56 | HR 69 | Temp 98.1°F | Ht 67.0 in | Wt 166.6 lb

## 2012-02-02 DIAGNOSIS — J441 Chronic obstructive pulmonary disease with (acute) exacerbation: Secondary | ICD-10-CM

## 2012-02-02 DIAGNOSIS — R0602 Shortness of breath: Secondary | ICD-10-CM

## 2012-02-02 DIAGNOSIS — R911 Solitary pulmonary nodule: Secondary | ICD-10-CM

## 2012-02-02 MED ORDER — PREDNISONE 10 MG PO TABS: ORAL_TABLET | ORAL | Status: DC

## 2012-02-02 MED ORDER — IPRATROPIUM BROMIDE 0.02 % IN SOLN: 0.5000 mg | Freq: Four times a day (QID) | RESPIRATORY_TRACT | Status: DC

## 2012-02-02 MED ORDER — ALBUTEROL SULFATE (5 MG/ML) 0.5% IN NEBU: 2.5000 mg | INHALATION_SOLUTION | Freq: Four times a day (QID) | RESPIRATORY_TRACT | Status: DC

## 2012-02-02 NOTE — Progress Notes (Signed)
Subjective:    Patient ID: [name], male    DOB: 19-Jan-1951, 61 y.o.   MRN: [id number]  HPI  61 y.o.  with known history of with known history of COPD , current smoker . Golds Stage III  FeV1 39%   08/17/11 ov/[name]  Not seen since 2/12.  Adm 11/2-11/10 for Strep Pneumonia PNA/CAP, acute resp failure, copd exac, vent dependent, AMI with cath.   Notes some cough, some mucus.   rec Stay on nebulizer 4 times daily (can occasionally do this three times daily out of convenience) No other medication changes Chest xray and labs today Keep cardiology appt and f/u with Dr Artist Pais Return [place] two months Stay off smoking Stay off research inhaler for now, the research RN will call you  cxr 08/17/11  Emphysema/COPD with evidence of pulmonary arterial hypertension.  Stable appearance compared to chest radiograph of 2010.  No radiographic evidence of pneumonia or CHF. rec Stay on nebulizer 4 times daily (can occasionally do this three times daily out of convenience) No other medication changes Chest xray and labs today Keep cardiology appt and f/u with Dr Artist Pais Return [place] two months Stay off smoking Stay off research inhaler for now, the research RN will call you   1/11 ov/[name] still not smoking  Pt c/o chest tightness x 2 days and also has runny nose and prod cough with clear to light brown sputum. Over using both saba hfa and neb and never able to reduce the neb as per Dr Lynelle Doctor last set of instructions, doesn't think he can afford more maint rx.   No CP. Also denies any obvious fluctuation of symptoms with weather or environmental changes or other aggravating or alleviating factors except as outlined above    1/14 Tried the symbicort two puff bid and helps some.  Still coughing  Brown mucus.   Also on doxycycline,    11/27/2011 Dyspnea sl better.  Mucus still present , gray to light brown. Worse in the am.  No real wheeze. No chest pain   No cigarettes currently.      REC Resume symbicort, use samples No other medication changes Return 3 months   ACUTE OV 02/02/2012 with Dr Marchelle Gearing  Patient of Dr Delford Field. Known CAD with ef 45% and Gold stage 3 copd. Got dyspneic 2 days ago acutely at cardiac rehab. Saw cards yesterday 02/01/12. AECOPD suspected based on labs below. 6 day prednisone and 7 day doxy started. REferred here today 02/02/2012. Says since onset dyspnea better and able to do rehab but reporting worsening cough of mild severity only with worsening sputum volume than baseline but no change in sputum color. Denies fever, edema, chest pain, hemoptysis, syncope   CAT score is 13 today  - unclear his baseline. He has level 2 cough, level 3 phlegm, level 2 chest tighntenss, level 1 difficulty with stairs, level 1 ADL difficulty, level 0 confidence leaving home, level 1 sleep qualty and level  fatigue   Lab 02/01/12 1248  PROBNP 271.0*    Lab 02/01/12 1248  HGB 12.5*  HCT 36.8*  WBC 8.1  PLT 152.0    Lab 02/01/12 1248  NA 141  K 5.2*  CL 105  CO2 27  GLUCOSE 91  BUN 28*  CREATININE 1.2  CALCIUM 9.3  MG --  PHOS --    No results found for this basename: TROPONINI:5 in the last 168 hours No results found.   CXR 02/01/12 - clear byut possible  LLL 1.2cm v nipple shadow   Current outpatient prescriptions:albuterol (PROVENTIL HFA;VENTOLIN HFA) 108 (90 BASE) MCG/ACT inhaler, Inhale 2 puffs into the lungs every 6 (six) hours as needed. , Disp: , Rfl: ;  albuterol (PROVENTIL) (5 MG/ML) 0.5% nebulizer solution, Take 0.5 mLs (2.5 mg total) by nebulization 4 (four) times daily., Disp: 20 mL, Rfl: 6 ALPRAZolam (XANAX) 0.25 MG tablet, Take 1 tablet (0.25 mg total) by mouth every 8 (eight) hours as needed., Disp: 30 tablet, Rfl: 3;  amitriptyline (ELAVIL) 25 MG tablet, TAKE ONE TABLET BY MOUTH EVERY DAY AT BEDTIME, Disp: 90 tablet, Rfl: 1;  aspirin 325 MG tablet, Take 325 mg by mouth daily. , Disp: , Rfl: ;  b complex vitamins tablet, Take 1 tablet by  mouth daily. , Disp: , Rfl:  bisoprolol (ZEBETA) 5 MG tablet, Take 1 tablet (5 mg total) by mouth daily., Disp: 30 tablet, Rfl: 6;  budesonide-formoterol (SYMBICORT) 160-4.5 MCG/ACT inhaler, Take 2 puffs first thing in am and then another 2 puffs about 12 hours later., Disp: 1 Inhaler, Rfl: 11;  clopidogrel (PLAVIX) 75 MG tablet, Take 1 tablet (75 mg total) by mouth daily with breakfast., Disp: 30 tablet, Rfl: 6 digoxin (LANOXIN) 0.125 MG tablet, Take 1 tablet (0.125 mg total) by mouth daily., Disp: 30 tablet, Rfl: 6;  doxycycline (VIBRA-TABS) 100 MG tablet, Take 1 tablet (100 mg total) by mouth 2 (two) times daily., Disp: 14 tablet, Rfl: 0;  ipratropium (ATROVENT) 0.02 % nebulizer solution, Take 2.5 mLs (0.5 mg total) by nebulization 4 (four) times daily., Disp: 75 mL, Rfl: 6 iron polysaccharides (NU-IRON) 150 MG capsule, Take 1 capsule (150 mg total) by mouth 2 (two) times daily., Disp: 60 capsule, Rfl: 3;  isosorbide dinitrate (ISORDIL) 30 MG tablet, Take 0.5 tablets (15 mg total) by mouth daily., Disp: 30 tablet, Rfl: 6;  losartan (COZAAR) 50 MG tablet, Take 1 tablet (50 mg total) by mouth daily., Disp: 90 tablet, Rfl: 1 morphine (MSIR) 15 MG tablet, Take 1 tablet (15 mg total) by mouth every 12 (twelve) hours., Disp: 60 tablet, Rfl: 0;  multivitamin (THERAGRAN) per tablet, Take 1 tablet by mouth daily. , Disp: , Rfl: ;  polyethylene glycol (MIRALAX / GLYCOLAX) packet, Take 17 g by mouth as needed. , Disp: , Rfl:  predniSONE (STERAPRED UNI-PAK) 10 MG tablet, Take 1 tablet (10 mg total) by mouth as directed. 6 TABS TODAY, 5 TABS FRI, 4 TABS SAT, 3 TABS SUN, 2 TABS MON, 1 TAB TUES, THEN STOP, Disp: 21 tablet, Rfl: 0;  simvastatin (ZOCOR) 20 MG tablet, Take 1 tablet (20 mg total) by mouth at bedtime., Disp: 30 tablet, Rfl: 5  Review of Systems  Constitutional: Positive for unexpected weight change. Negative for fever.  HENT: Positive for sneezing and postnasal drip. Negative for ear pain, nosebleeds,  congestion, sore throat, rhinorrhea, trouble swallowing, dental problem and sinus pressure.   Eyes: Negative for redness and itching.  Respiratory: Positive for cough, chest tightness, shortness of breath and wheezing.   Cardiovascular: Negative for chest pain, palpitations and leg swelling.  Gastrointestinal: Negative for nausea and vomiting.  Genitourinary: Positive for difficulty urinating. Negative for dysuria.  Musculoskeletal: Negative for joint swelling.  Skin: Negative for rash.  Neurological: Negative for headaches.  Hematological: Bruises/bleeds easily.  Psychiatric/Behavioral: Negative for dysphoric mood. The patient is not nervous/anxious.        Objective:   Physical Exam  Gen: Pleasant, well-nourished, in no distress,  normal affect  ENT: No lesions,  mouth clear,  oropharynx clear, no postnasal drip Edentulous with dentures in place  Neck: No JVD, no TMG, no carotid bruits  Lungs: No use of accessory muscles, no dullness to percussion,distant BS  BUT SCATTERED WHEEZE +  Cardiovascular: RRR, heart sounds normal, no murmur or gallops, no peripheral edema  Abdomen: soft and NT, no HSM,  BS normal  Musculoskeletal: No deformities, no cyanosis or clubbing  Neuro: alert, non focal                             Skin: Warm, no lesions or rashes               Assessment & Plan:  6

## 2012-02-02 NOTE — Patient Instructions (Signed)
#  COPD attack  You have mild attack of copd called COPD exacerbation Please take doxycycline 100mg  twice daily after meals x 7 days; avoid sunlight - this is just per cardiology advice  Pleas change prednisone to below - nurse will do script Please take prednisone 40mg  once daily x 3 days, then 20mg  once daily x 3 days, then 10mg  once daily x 3 days, then 5mg  once dailyx 3 days and stop Nurse will do refill on nebulizer  #Lung nodule  - there is some lack of clarity if there is a nodule on cxr yesterday. We are waiting on report of cxr today  - depending on results, might need ct chest  #Followup  - 1 month with Dr Danise Mina - come sooner or call if not well or get worse

## 2012-02-03 ENCOUNTER — Telehealth: Payer: Self-pay | Admitting: Internal Medicine

## 2012-02-03 DIAGNOSIS — R911 Solitary pulmonary nodule: Secondary | ICD-10-CM | POA: Insufficient documentation

## 2012-02-03 DIAGNOSIS — J441 Chronic obstructive pulmonary disease with (acute) exacerbation: Secondary | ICD-10-CM | POA: Insufficient documentation

## 2012-02-03 NOTE — Assessment & Plan Note (Signed)
LLL nodule v nipple shadow in left lower lobe on cxr. However, given coopd, age, smoking hx and potential for nodule; best to get CT chest wihtout contratsst

## 2012-02-03 NOTE — Telephone Encounter (Signed)
Crystal  This is Dr Delford Field patient. CXR showed LLL nodule v nippl shadow. I saw him acoutely 02/02/12. I have given some thought to th issue. Best he hvave ct chest without contrast  Thanks  MR

## 2012-02-03 NOTE — Assessment & Plan Note (Signed)
#  COPD attack  You have mild attack of copd called COPD exacerbation Please take doxycycline 100mg  twice daily after meals x 7 days; avoid sunlight - this is just per cardiology advice  Pleas change prednisone to below - nurse will do script Please take prednisone 40mg  once daily x 3 days, then 20mg  once daily x 3 days, then 10mg  once daily x 3 days, then 5mg  once dailyx 3 days and stop Nurse will do refill on nebulizer  #Followup  - 1 month with Dr Danise Mina - come sooner or call if not well or get worse

## 2012-02-05 ENCOUNTER — Telehealth: Payer: Self-pay | Admitting: *Deleted

## 2012-02-05 ENCOUNTER — Encounter (HOSPITAL_COMMUNITY): Payer: Medicaid Other

## 2012-02-05 NOTE — Telephone Encounter (Signed)
When called to let patient know about xray wife stated Dr Marchelle Gearing had ordered CT of chest and patient had today.  Advised wife that Dr Jane Canary office would be calling with report.

## 2012-02-05 NOTE — Telephone Encounter (Signed)
Message copied by Burnell Blanks on Mon Feb 05, 2012  2:26 PM ------      Message from: [place], [place] T      Created: Mon Feb 05, 2012  1:38 PM       Abnormality on CXR from nipple shadow.      CXR is ok.      Tereso Newcomer, PA-C  1:37 PM 02/05/2012

## 2012-02-06 NOTE — Telephone Encounter (Signed)
Order has already been placed by MR and CT Chest has already been scheduled for Feb 08, 2012.

## 2012-02-07 ENCOUNTER — Encounter (HOSPITAL_COMMUNITY): Payer: Medicaid Other

## 2012-02-08 ENCOUNTER — Telehealth: Payer: Self-pay | Admitting: Internal Medicine

## 2012-02-08 ENCOUNTER — Ambulatory Visit (INDEPENDENT_AMBULATORY_CARE_PROVIDER_SITE_OTHER)
Admission: RE | Admit: 2012-02-08 | Discharge: 2012-02-08 | Disposition: A | Discharge status: Home / Self Care | Payer: Medicaid Other | Source: Ambulatory Visit | Attending: Internal Medicine | Admitting: Internal Medicine

## 2012-02-08 DIAGNOSIS — R911 Solitary pulmonary nodule: Secondary | ICD-10-CM

## 2012-02-08 NOTE — Telephone Encounter (Signed)
  All contennts of the ct discussed - advisd fu CT chest in 1  Year. He said last pneumovax 10 years ago. He will fu with Dr Delford Field for this. cc'ing Dr Delford Field on this; no need to re;ply  Ct Chest Wo Contrast  02/08/2012  *  IMPRESSION:  1.  Scattered tiny pulmonary nodules.  As this patient is at high risk for bronchogenic carcinoma, follow-up chest CT at 1 year is recommended.  This recommendation follows the consensus statement: Guidelines for Management of Small Pulmonary Nodules Detected on CT Scans:  A Statement from the Fleischner Society as published in Radiology 2005; 237:395-400. 2.  Small ascending aortic aneurysm.  Pulmonary arterial hypertension. 3.  Age advanced coronary artery calcification. 4.  Narrowing of the right middle lobe bronchus, etiology indeterminate.  Original Report Authenticated By: Reyes Ivan, M.D.

## 2012-02-09 ENCOUNTER — Telehealth: Payer: Self-pay | Admitting: *Deleted

## 2012-02-09 ENCOUNTER — Encounter (HOSPITAL_COMMUNITY)
Admission: RE | Admit: 2012-02-09 | Discharge: 2012-02-09 | Disposition: A | Discharge status: Home / Self Care | Payer: Medicaid Other | Source: Ambulatory Visit | Attending: Cardiology | Admitting: Cardiology

## 2012-02-09 DIAGNOSIS — I10 Essential (primary) hypertension: Secondary | ICD-10-CM

## 2012-02-09 NOTE — Telephone Encounter (Signed)
ptcb and states he will come in on 5/13 for repeat bmet that should had been done 5/6

## 2012-02-09 NOTE — Telephone Encounter (Signed)
Message copied by Tarri Fuller on Fri Feb 09, 2012 12:58 PM ------      Message from: [place], [place] T      Created: Thu Feb 08, 2012  4:40 PM       Has repeat BMET been done (for high K+)?      Tereso Newcomer, PA-C  4:40 PM 02/08/2012

## 2012-02-09 NOTE — Progress Notes (Signed)
[name]'s blood pressure went up to 170/68 on the nustep today.  Patient switched to the track.  Subsequent blood pressures improved. Exit blood pressure 118/62. . Will send exercise flow sheets for review via Careers information officer. Patient asymptomatic.

## 2012-02-12 ENCOUNTER — Encounter (HOSPITAL_COMMUNITY)
Admission: RE | Admit: 2012-02-12 | Discharge: 2012-02-12 | Disposition: A | Discharge status: Home / Self Care | Payer: Medicaid Other | Source: Ambulatory Visit | Attending: Cardiology | Admitting: Cardiology

## 2012-02-12 ENCOUNTER — Other Ambulatory Visit (INDEPENDENT_AMBULATORY_CARE_PROVIDER_SITE_OTHER): Payer: Medicaid Other

## 2012-02-12 DIAGNOSIS — I10 Essential (primary) hypertension: Secondary | ICD-10-CM

## 2012-02-12 LAB — BASIC METABOLIC PANEL
BUN: 25 mg/dL — ABNORMAL HIGH (ref 6–23)
CO2: 27 mEq/L (ref 19–32)
Chloride: 98 mEq/L (ref 96–112)
Creatinine, Ser: 1.2 mg/dL (ref 0.4–1.5)
Glucose, Bld: 114 mg/dL — ABNORMAL HIGH (ref 70–99)
Potassium: 4.2 mEq/L (ref 3.5–5.1)

## 2012-02-13 ENCOUNTER — Telehealth: Payer: Self-pay | Admitting: *Deleted

## 2012-02-13 NOTE — Telephone Encounter (Signed)
Message copied by Tarri Fuller on Tue Feb 13, 2012  9:30 AM ------      Message from: [place], [place] T      Created: Mon Feb 12, 2012  5:02 PM       Please notify patient that the lab results are ok.      Tereso Newcomer, PA-C  5:02 PM 02/12/2012

## 2012-02-13 NOTE — Telephone Encounter (Signed)
pt's wife notified of lab results today

## 2012-02-14 ENCOUNTER — Encounter (HOSPITAL_COMMUNITY)
Admission: RE | Admit: 2012-02-14 | Discharge: 2012-02-14 | Disposition: A | Discharge status: Home / Self Care | Payer: Medicaid Other | Source: Ambulatory Visit | Attending: Cardiology | Admitting: Cardiology

## 2012-02-16 ENCOUNTER — Encounter (HOSPITAL_COMMUNITY)
Admission: RE | Admit: 2012-02-16 | Discharge: 2012-02-16 | Disposition: A | Discharge status: Home / Self Care | Payer: Medicaid Other | Source: Ambulatory Visit | Attending: Cardiology | Admitting: Cardiology

## 2012-02-19 ENCOUNTER — Encounter (HOSPITAL_COMMUNITY)
Admission: RE | Admit: 2012-02-19 | Discharge: 2012-02-19 | Disposition: A | Discharge status: Home / Self Care | Payer: Medicaid Other | Source: Ambulatory Visit | Attending: Cardiology | Admitting: Cardiology

## 2012-02-21 ENCOUNTER — Encounter (HOSPITAL_COMMUNITY)
Admission: RE | Admit: 2012-02-21 | Discharge: 2012-02-21 | Disposition: A | Discharge status: Home / Self Care | Payer: Medicaid Other | Source: Ambulatory Visit | Attending: Cardiology | Admitting: Cardiology

## 2012-02-23 ENCOUNTER — Encounter (HOSPITAL_COMMUNITY)
Admission: RE | Admit: 2012-02-23 | Discharge: 2012-02-23 | Disposition: A | Discharge status: Home / Self Care | Payer: Medicaid Other | Source: Ambulatory Visit | Attending: Cardiology | Admitting: Cardiology

## 2012-02-27 ENCOUNTER — Ambulatory Visit (INDEPENDENT_AMBULATORY_CARE_PROVIDER_SITE_OTHER): Payer: Medicaid Other | Admitting: Cardiology

## 2012-02-27 ENCOUNTER — Encounter: Payer: Self-pay | Admitting: Cardiology

## 2012-02-27 VITALS — BP 120/60 | HR 58 | Ht 67.0 in | Wt 165.0 lb

## 2012-02-27 DIAGNOSIS — I255 Ischemic cardiomyopathy: Secondary | ICD-10-CM

## 2012-02-27 DIAGNOSIS — I701 Atherosclerosis of renal artery: Secondary | ICD-10-CM

## 2012-02-27 DIAGNOSIS — IMO0001 Reserved for inherently not codable concepts without codable children: Secondary | ICD-10-CM

## 2012-02-27 DIAGNOSIS — I2589 Other forms of chronic ischemic heart disease: Secondary | ICD-10-CM

## 2012-02-27 DIAGNOSIS — E785 Hyperlipidemia, unspecified: Secondary | ICD-10-CM

## 2012-02-27 DIAGNOSIS — I714 Abdominal aortic aneurysm, without rupture: Secondary | ICD-10-CM

## 2012-02-27 DIAGNOSIS — I10 Essential (primary) hypertension: Secondary | ICD-10-CM

## 2012-02-27 DIAGNOSIS — I679 Cerebrovascular disease, unspecified: Secondary | ICD-10-CM

## 2012-02-27 DIAGNOSIS — I712 Thoracic aortic aneurysm, without rupture: Secondary | ICD-10-CM | POA: Insufficient documentation

## 2012-02-27 DIAGNOSIS — I2510 Atherosclerotic heart disease of native coronary artery without angina pectoris: Secondary | ICD-10-CM

## 2012-02-27 MED ORDER — ISOSORBIDE DINITRATE 30 MG PO TABS: ORAL_TABLET | ORAL | Status: DC

## 2012-02-27 MED ORDER — ISOSORBIDE DINITRATE 30 MG PO TABS: 15.0000 mg | ORAL_TABLET | Freq: Every day | ORAL | Status: DC

## 2012-02-27 MED ORDER — BISOPROLOL FUMARATE 5 MG PO TABS: 5.0000 mg | ORAL_TABLET | Freq: Every day | ORAL | Status: DC

## 2012-02-27 MED ORDER — CLOPIDOGREL BISULFATE 75 MG PO TABS: 75.0000 mg | ORAL_TABLET | Freq: Every day | ORAL | Status: DC

## 2012-02-27 NOTE — Assessment & Plan Note (Signed)
Continue aspirin and statin. 

## 2012-02-27 NOTE — Assessment & Plan Note (Signed)
Followed by vascular surgery. 

## 2012-02-27 NOTE — Assessment & Plan Note (Signed)
Continue statin. 
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
## 2012-02-27 NOTE — Assessment & Plan Note (Signed)
Blood pressure controlled. Continue present medications. 

## 2012-02-27 NOTE — Assessment & Plan Note (Signed)
Mildly enlarged thoracic aortic root on recent CT. He needs followup CTAs in the future for nodule which is followed by pulmonary. We will reassess his thoracic aortic root on those images.

## 2012-02-27 NOTE — Assessment & Plan Note (Signed)
Continue aspirin and statin. Followed by vascular surgery. 

## 2012-02-27 NOTE — Assessment & Plan Note (Signed)
Continue ARB and beta blocker. Discontinue digoxin.

## 2012-02-27 NOTE — Assessment & Plan Note (Signed)
Continue aspirin, Plavix and statin. 

## 2012-02-27 NOTE — Patient Instructions (Signed)
Your physician wants you to follow-up in: 6 MONTHS WITH DR CRENSHAW You will receive a reminder letter in the mail two months in advance. If you don't receive a letter, please call our office to schedule the follow-up appointment.   STOP DIGOXIN 

## 2012-02-28 ENCOUNTER — Encounter (HOSPITAL_COMMUNITY)
Admission: RE | Admit: 2012-02-28 | Discharge: 2012-02-28 | Disposition: A | Discharge status: Home / Self Care | Payer: Medicaid Other | Source: Ambulatory Visit | Attending: Cardiology | Admitting: Cardiology

## 2012-03-01 ENCOUNTER — Encounter (HOSPITAL_COMMUNITY)
Admission: RE | Admit: 2012-03-01 | Discharge: 2012-03-01 | Disposition: A | Discharge status: Home / Self Care | Payer: Medicaid Other | Source: Ambulatory Visit | Attending: Cardiology | Admitting: Cardiology

## 2012-03-04 ENCOUNTER — Encounter (HOSPITAL_COMMUNITY)
Admission: RE | Admit: 2012-03-04 | Discharge: 2012-03-04 | Disposition: A | Discharge status: Home / Self Care | Payer: Medicaid Other | Source: Ambulatory Visit | Attending: Cardiology | Admitting: Cardiology

## 2012-03-04 DIAGNOSIS — H349 Unspecified retinal vascular occlusion: Secondary | ICD-10-CM | POA: Insufficient documentation

## 2012-03-04 DIAGNOSIS — I739 Peripheral vascular disease, unspecified: Secondary | ICD-10-CM | POA: Insufficient documentation

## 2012-03-04 DIAGNOSIS — I711 Thoracic aortic aneurysm, ruptured, unspecified: Secondary | ICD-10-CM | POA: Insufficient documentation

## 2012-03-04 DIAGNOSIS — I252 Old myocardial infarction: Secondary | ICD-10-CM | POA: Insufficient documentation

## 2012-03-04 DIAGNOSIS — I701 Atherosclerosis of renal artery: Secondary | ICD-10-CM | POA: Insufficient documentation

## 2012-03-04 DIAGNOSIS — I10 Essential (primary) hypertension: Secondary | ICD-10-CM | POA: Insufficient documentation

## 2012-03-04 DIAGNOSIS — Z5189 Encounter for other specified aftercare: Secondary | ICD-10-CM | POA: Insufficient documentation

## 2012-03-04 DIAGNOSIS — J4489 Other specified chronic obstructive pulmonary disease: Secondary | ICD-10-CM | POA: Insufficient documentation

## 2012-03-04 DIAGNOSIS — I6529 Occlusion and stenosis of unspecified carotid artery: Secondary | ICD-10-CM | POA: Insufficient documentation

## 2012-03-04 DIAGNOSIS — I714 Abdominal aortic aneurysm, without rupture, unspecified: Secondary | ICD-10-CM | POA: Insufficient documentation

## 2012-03-04 DIAGNOSIS — E785 Hyperlipidemia, unspecified: Secondary | ICD-10-CM | POA: Insufficient documentation

## 2012-03-04 DIAGNOSIS — I2510 Atherosclerotic heart disease of native coronary artery without angina pectoris: Secondary | ICD-10-CM | POA: Insufficient documentation

## 2012-03-04 DIAGNOSIS — R011 Cardiac murmur, unspecified: Secondary | ICD-10-CM | POA: Insufficient documentation

## 2012-03-04 DIAGNOSIS — I679 Cerebrovascular disease, unspecified: Secondary | ICD-10-CM | POA: Insufficient documentation

## 2012-03-04 DIAGNOSIS — J449 Chronic obstructive pulmonary disease, unspecified: Secondary | ICD-10-CM | POA: Insufficient documentation

## 2012-03-05 ENCOUNTER — Ambulatory Visit (INDEPENDENT_AMBULATORY_CARE_PROVIDER_SITE_OTHER): Payer: Medicaid Other | Admitting: Adult Health

## 2012-03-05 ENCOUNTER — Encounter: Payer: Self-pay | Admitting: Adult Health

## 2012-03-05 VITALS — BP 134/64 | HR 49 | Temp 97.5°F | Ht 67.0 in | Wt 164.0 lb

## 2012-03-05 DIAGNOSIS — J449 Chronic obstructive pulmonary disease, unspecified: Secondary | ICD-10-CM

## 2012-03-05 DIAGNOSIS — R911 Solitary pulmonary nodule: Secondary | ICD-10-CM

## 2012-03-05 MED ORDER — ALBUTEROL SULFATE HFA 108 (90 BASE) MCG/ACT IN AERS: 2.0000 | INHALATION_SPRAY | Freq: Four times a day (QID) | RESPIRATORY_TRACT | Status: DC | PRN

## 2012-03-05 NOTE — Patient Instructions (Signed)
Continue on current regimen.  follow up Dr. Delford Field  In 6 weeks and As needed

## 2012-03-06 ENCOUNTER — Encounter (HOSPITAL_COMMUNITY)
Admission: RE | Admit: 2012-03-06 | Discharge: 2012-03-06 | Disposition: A | Discharge status: Home / Self Care | Payer: Medicaid Other | Source: Ambulatory Visit | Attending: Cardiology | Admitting: Cardiology

## 2012-03-06 ENCOUNTER — Telehealth: Payer: Self-pay | Admitting: *Deleted

## 2012-03-06 NOTE — Telephone Encounter (Signed)
[name], cardiac rehab, calls with bp readings. Prior to exercise today bp was 136/58 After exercise (*pt said he worked harder this time)  BP was 210/94 At rest BP 158/80  Pt resumed lighter exercise during rehab and blood pressure  Was 150/64. Pt was asymptomatic during all exercises and denied any back pain. Mylo Red RN

## 2012-03-06 NOTE — Progress Notes (Signed)
Cardiac Rehab BP-210/94 HR-103 on NUstep.   Exercise stopped.  Recheck 158/84.  Exercise resumed at lower workload, highest BP-150/64.  Pt denies missed meds or diet changes.  Pt denies increased sodium intake.  Pt denies back pain and reports he has taken pain medication today as prescribed.  Debbie, Dr. Ludwig Clarks office informed.  No new recommendations.  Will continue to monitor. Pt advised to avoid exercise and activities at home that are equivalent workload as Nustep.  Understanding verbalized

## 2012-03-07 NOTE — Assessment & Plan Note (Addendum)
Compensated on present regimen w/ Symbicort and Atrovent Neb  follow up in 3 months and As needed

## 2012-03-07 NOTE — Assessment & Plan Note (Signed)
CT showed scattered pulm nodules  Will need to repeat at 1 year
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
## 2012-03-08 ENCOUNTER — Encounter (HOSPITAL_COMMUNITY)
Admission: RE | Admit: 2012-03-08 | Discharge: 2012-03-08 | Disposition: A | Discharge status: Home / Self Care | Payer: Medicaid Other | Source: Ambulatory Visit | Attending: Cardiology | Admitting: Cardiology

## 2012-03-11 ENCOUNTER — Encounter (HOSPITAL_COMMUNITY)
Admission: RE | Admit: 2012-03-11 | Discharge: 2012-03-11 | Disposition: A | Discharge status: Home / Self Care | Payer: Medicaid Other | Source: Ambulatory Visit | Attending: Cardiology | Admitting: Cardiology

## 2012-03-12 ENCOUNTER — Other Ambulatory Visit: Payer: Self-pay | Admitting: *Deleted

## 2012-03-12 MED ORDER — ALPRAZOLAM 0.25 MG PO TABS: 0.2500 mg | ORAL_TABLET | Freq: Three times a day (TID) | ORAL | Status: DC | PRN

## 2012-03-13 ENCOUNTER — Encounter (HOSPITAL_COMMUNITY)
Admission: RE | Admit: 2012-03-13 | Discharge: 2012-03-13 | Disposition: A | Discharge status: Home / Self Care | Payer: Medicaid Other | Source: Ambulatory Visit | Attending: Cardiology | Admitting: Cardiology

## 2012-03-15 ENCOUNTER — Other Ambulatory Visit (INDEPENDENT_AMBULATORY_CARE_PROVIDER_SITE_OTHER): Payer: Self-pay

## 2012-03-15 ENCOUNTER — Encounter (HOSPITAL_COMMUNITY): Payer: Self-pay

## 2012-03-15 ENCOUNTER — Telehealth (HOSPITAL_COMMUNITY): Payer: Self-pay | Admitting: Cardiac Rehabilitation

## 2012-03-15 ENCOUNTER — Encounter (HOSPITAL_COMMUNITY)
Admission: RE | Admit: 2012-03-15 | Discharge: 2012-03-15 | Disposition: A | Discharge status: Home / Self Care | Payer: Self-pay | Source: Ambulatory Visit | Attending: Cardiology | Admitting: Cardiology

## 2012-03-15 DIAGNOSIS — I2510 Atherosclerotic heart disease of native coronary artery without angina pectoris: Secondary | ICD-10-CM

## 2012-03-15 LAB — BASIC METABOLIC PANEL
CO2: 27 mEq/L (ref 19–32)
Calcium: 9.1 mg/dL (ref 8.4–10.5)
Creatinine, Ser: 1.3 mg/dL (ref 0.4–1.5)

## 2012-03-15 NOTE — Telephone Encounter (Signed)
Message copied by Robyne Peers on Fri Mar 15, 2012  8:10 AM ------      Message from: MATHIS, DEBRA W      Created: Thu Mar 14, 2012  3:07 PM      Regarding: FW: Cardiac Rehab                   ----- Message -----         From: Lewayne Bunting, MD         Sent: 03/08/2012  11:28 PM           To: Deliah Goody, RN      Subject: FW: Cardiac Rehab                                        Continue to follow bp      Olga Millers            ----- Message -----         From: Robyne Peers, RN         Sent: 03/08/2012   2:43 PM           To: Lewayne Bunting, MD      Subject: Cardiac Rehab                                            Dr. Jens Som,            Lorain Childes.Marland Kitchenpt had exercise hypertension this week more than usual.  Highest BP-210/94, exercise stopped BP returned to normal.  Pt denies missed meds or change in diet/routine.  Denies back pain which has been cause of hypertension in past. Please see rehab report scanned in for your review and advise accordingly.            Thank you,       Deveron Furlong, RN      Cardiac Pulmonary Rehab

## 2012-03-15 NOTE — Addendum Note (Signed)
Addended by: Rita Ohara R on: 03/15/2012 10:05 AM   Modules accepted: Orders

## 2012-03-18 ENCOUNTER — Encounter (HOSPITAL_COMMUNITY): Payer: Medicaid Other

## 2012-03-20 ENCOUNTER — Encounter (HOSPITAL_COMMUNITY): Payer: Medicaid Other

## 2012-03-21 ENCOUNTER — Ambulatory Visit (INDEPENDENT_AMBULATORY_CARE_PROVIDER_SITE_OTHER): Payer: Self-pay | Admitting: Internal Medicine

## 2012-03-21 ENCOUNTER — Encounter: Payer: Self-pay | Admitting: Internal Medicine

## 2012-03-21 VITALS — BP 134/76 | HR 68 | Temp 98.4°F | Wt 163.0 lb

## 2012-03-21 DIAGNOSIS — I10 Essential (primary) hypertension: Secondary | ICD-10-CM

## 2012-03-21 DIAGNOSIS — M545 Low back pain: Secondary | ICD-10-CM

## 2012-03-21 MED ORDER — MORPHINE SULFATE 15 MG PO TABS: 15.0000 mg | ORAL_TABLET | Freq: Two times a day (BID) | ORAL | Status: DC

## 2012-03-21 MED ORDER — MORPHINE SULFATE 15 MG PO TABS: 15.0000 mg | ORAL_TABLET | Freq: Three times a day (TID) | ORAL | Status: DC

## 2012-03-21 NOTE — Patient Instructions (Addendum)
Follow up in 2 months BMET 1 wk prior dx 276.7

## 2012-03-21 NOTE — Assessment & Plan Note (Signed)
Patient experiencing exacerbation of low back pain since participating in cardiac rehabilitation. Increase morphine sulfate 15 mg to 3 times a day.

## 2012-03-21 NOTE — Assessment & Plan Note (Signed)
Good response to ARB vs ACE.  His respiratory status has improved.
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
## 2012-03-22 ENCOUNTER — Ambulatory Visit: Payer: Medicaid Other | Admitting: Internal Medicine

## 2012-03-22 ENCOUNTER — Encounter (HOSPITAL_COMMUNITY): Payer: Medicaid Other

## 2012-03-25 ENCOUNTER — Encounter (HOSPITAL_COMMUNITY): Payer: Medicaid Other

## 2012-03-27 ENCOUNTER — Encounter (HOSPITAL_COMMUNITY): Payer: Medicaid Other

## 2012-03-29 ENCOUNTER — Encounter (HOSPITAL_COMMUNITY): Payer: Medicaid Other

## 2012-04-02 NOTE — Progress Notes (Signed)
Addendum to Cardiac Rehab Program Outcomes Report:  Pt successfully completed cardiac rehab attending 32/36 exercise and 11 education sessions. Pt had good participation.   Pt hypertensive at times-highest resting BP-164/68 highest exercise BP-184/74  BP recovered with rest.  Telemetry-NSR, occ PVC.  Pt METS increased from 2.3 at baseline to 3.6 upon completion of program. Pt plans to exercise on his own.  Pt did not have hospital admission during cardiac rehab period.  Pt has made positive lifestyle changes, pt has maintained smoking cessation however continues to use Ecigarettes.  Pt will need MD reinforcement to counsel on potential harm with Ecigarettes as nicotine replacement product. Pt should be commended for his efforts.  Pt was a pleasure to work with.  Thank you for the referral.

## 2012-04-10 ENCOUNTER — Ambulatory Visit: Payer: Self-pay | Admitting: Vascular Surgery

## 2012-04-10 ENCOUNTER — Other Ambulatory Visit: Payer: Medicaid Other

## 2012-04-22 ENCOUNTER — Ambulatory Visit (INDEPENDENT_AMBULATORY_CARE_PROVIDER_SITE_OTHER): Payer: Self-pay | Admitting: Critical Care Medicine

## 2012-04-22 ENCOUNTER — Encounter: Payer: Self-pay | Admitting: Critical Care Medicine

## 2012-04-22 ENCOUNTER — Telehealth: Payer: Self-pay | Admitting: Internal Medicine

## 2012-04-22 VITALS — BP 112/60 | HR 77 | Temp 98.0°F | Ht 67.0 in | Wt 170.0 lb

## 2012-04-22 DIAGNOSIS — J449 Chronic obstructive pulmonary disease, unspecified: Secondary | ICD-10-CM

## 2012-04-22 DIAGNOSIS — J441 Chronic obstructive pulmonary disease with (acute) exacerbation: Secondary | ICD-10-CM

## 2012-04-22 DIAGNOSIS — Z23 Encounter for immunization: Secondary | ICD-10-CM

## 2012-04-22 MED ORDER — ALBUTEROL SULFATE (5 MG/ML) 0.5% IN NEBU: 2.5000 mg | INHALATION_SOLUTION | Freq: Four times a day (QID) | RESPIRATORY_TRACT | Status: DC

## 2012-04-22 MED ORDER — PREDNISONE 10 MG PO TABS: ORAL_TABLET | ORAL | Status: DC

## 2012-04-22 MED ORDER — IPRATROPIUM BROMIDE 0.02 % IN SOLN: 0.5000 mg | Freq: Four times a day (QID) | RESPIRATORY_TRACT | Status: DC

## 2012-04-22 MED ORDER — ROFLUMILAST 500 MCG PO TABS: 500.0000 ug | ORAL_TABLET | Freq: Every day | ORAL | Status: DC

## 2012-04-22 MED ORDER — ALPRAZOLAM 0.25 MG PO TABS: 0.2500 mg | ORAL_TABLET | Freq: Three times a day (TID) | ORAL | Status: DC | PRN

## 2012-04-22 NOTE — Assessment & Plan Note (Signed)
Recurrent exacerbations with gold stage D. COPD  Plan Prednisone 10mg  Take 4 tablets daily for 5 days then stop Stay on symbicort 2 puff twice daily Start Daliresp one every other day for one week then one daily, use samples, refills sent to pharmacy Use atrovent in nebulizer as needed Return 2 months [place]

## 2012-04-22 NOTE — Patient Instructions (Addendum)
Prednisone 10mg  Take 4 tablets daily for 5 days then stop Stay on symbicort 2 puff twice daily Start Daliresp one every other day for one week then one daily, use samples, refills sent to pharmacy Use atrovent in nebulizer as needed Return 2 months [place]

## 2012-04-22 NOTE — Telephone Encounter (Signed)
rx called in
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
## 2012-04-22 NOTE — Telephone Encounter (Signed)
Ok to refill x 3 

## 2012-04-22 NOTE — Assessment & Plan Note (Signed)
Recurrent chronic obstructive lung disease gold stage D. Exacerbations Plan Prednisone 10mg  Take 4 tablets daily for 5 days then stop Stay on symbicort 2 puff twice daily Start Daliresp one every other day for one week then one daily, use samples, refills sent to pharmacy Use atrovent in nebulizer as needed Return 2 months [place]

## 2012-04-22 NOTE — Telephone Encounter (Signed)
Refill- alprazolam 0.25mg  tablet. Take one tablet every eight hours as needed.

## 2012-04-23 ENCOUNTER — Encounter: Payer: Self-pay | Admitting: Vascular Surgery

## 2012-04-24 ENCOUNTER — Encounter: Payer: Self-pay | Admitting: Vascular Surgery

## 2012-04-24 ENCOUNTER — Encounter (INDEPENDENT_AMBULATORY_CARE_PROVIDER_SITE_OTHER): Payer: Self-pay | Admitting: *Deleted

## 2012-04-24 ENCOUNTER — Other Ambulatory Visit (INDEPENDENT_AMBULATORY_CARE_PROVIDER_SITE_OTHER): Payer: Self-pay | Admitting: *Deleted

## 2012-04-24 ENCOUNTER — Ambulatory Visit (INDEPENDENT_AMBULATORY_CARE_PROVIDER_SITE_OTHER): Payer: Self-pay | Admitting: Vascular Surgery

## 2012-04-24 VITALS — BP 125/64 | HR 63 | Temp 97.9°F | Resp 12 | Ht 67.0 in | Wt 169.0 lb

## 2012-04-24 DIAGNOSIS — I714 Abdominal aortic aneurysm, without rupture: Secondary | ICD-10-CM

## 2012-04-24 DIAGNOSIS — I6529 Occlusion and stenosis of unspecified carotid artery: Secondary | ICD-10-CM

## 2012-04-24 NOTE — Assessment & Plan Note (Signed)
His carotid duplex scan today shows a 40-59% right carotid stenosis with a 60-79% left carotid stenosis. The stenosis on the left is in the lower end of that range. He understands we would not consider carotid endarterectomy unless the stenosis progressed to greater than 80%. He is on aspirin. I've ordered a fall duplex scan in 6 months. I'll see him back in one year and less there has been any significant change in his carotid duplex scan in 6 months. He knows to call sooner if he has problems.

## 2012-04-24 NOTE — Assessment & Plan Note (Signed)
The aneurysm today measures 5.3 centimeters in maximum diameter and is thus not changed significantly since January 2013 that measured 5.2 cm by CT scan. He has been asymptomatic. He understands we would not consider elective repair unless it reached 5.5 cm in maximum diameter. Of note based on his CT scan the aneurysm is juxtarenal and he does not appear to be a candidate for endovascular aneurysm repair. I have ordered a follow up ultrasound in 6 months. I'll plan on seeing him back in one year unless he has had any significant change in the size of his aneurysm at 6 months. Fortunately he quit smoking in 2011.
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
## 2012-05-01 NOTE — Procedures (Unsigned)
DUPLEX ULTRASOUND OF ABDOMINAL AORTA  INDICATION:  Followup of abdominal aortic aneurysm.  HISTORY: Diabetes:  No Cardiac:  MI Hypertension:  Yes Smoking:  Previously Connective Tissue Disorder: Family History: Previous Surgery:  DUPLEX EXAM:         AP (cm)                   TRANSVERSE (cm) Proximal             2.60 cm                   2.64 cm Mid                  5.02 cm                   4.70 cm Distal               5.31 cm                   4.37 cm Right Iliac          2.0  cm                   2.1  cm Left Iliac           1.46 cm                   1.30 cm  PREVIOUS:  CT Date:  AP:  5.2  TRANSVERSE:  IMPRESSION:  Positive for a 5.31 x 4.70 bilobular abdominal aortic aneurysm at its largest diameter.  ___________________________________________ Di Kindle. Edilia Bo, M.D.  SS/MEDQ  D:  04/24/2012  T:  04/24/2012  Job:  [id number]

## 2012-05-01 NOTE — Procedures (Unsigned)
CAROTID DUPLEX EXAM  INDICATION:  Follow up carotid artery stenosis.  HISTORY: Diabetes:  No. Cardiac:  MI. Hypertension:  Yes. Smoking:  Previous. Previous Surgery: CV History:  History of partial right vision loss, currently asymptomatic. Amaurosis Fugax No, Paresthesias No, Hemiparesis No                                      RIGHT             LEFT Brachial systolic pressure:         130               110 Brachial Doppler waveforms:         Triphasic         Monophasic Vertebral direction of flow:        Antegrade         Not visualized DUPLEX VELOCITIES (cm/sec) CCA peak systolic                   137               131 ECA peak systolic                   117               166 ICA peak systolic                   113               201 ICA end diastolic                   36                63 PLAQUE MORPHOLOGY:                  Heterogeneous, irregular            Calcific PLAQUE AMOUNT:                      Mild              Moderate PLAQUE LOCATION:                    Bifurcation, ICA, ECA               Bifurcation, ICA, ECA, CCA  IMPRESSION: 1. 40-59% right internal carotid artery stenosis, lower end of range. 2. 60-79% left internal carotid artery stenosis, lower end of range. 3. Left vertebral artery flow not visualized. 4. Right vertebral arterial flow antegrade.  ___________________________________________ Di Kindle. Edilia Bo, M.D.  SS/MEDQ  D:  04/24/2012  T:  04/24/2012  Job:  [id number]

## 2012-05-22 ENCOUNTER — Ambulatory Visit (INDEPENDENT_AMBULATORY_CARE_PROVIDER_SITE_OTHER): Payer: Self-pay | Admitting: Internal Medicine

## 2012-05-22 ENCOUNTER — Encounter: Payer: Self-pay | Admitting: Internal Medicine

## 2012-05-22 VITALS — BP 134/72 | HR 76 | Temp 98.1°F | Wt 166.0 lb

## 2012-05-22 DIAGNOSIS — D649 Anemia, unspecified: Secondary | ICD-10-CM

## 2012-05-22 DIAGNOSIS — J449 Chronic obstructive pulmonary disease, unspecified: Secondary | ICD-10-CM

## 2012-05-22 DIAGNOSIS — M545 Low back pain: Secondary | ICD-10-CM

## 2012-05-22 DIAGNOSIS — E785 Hyperlipidemia, unspecified: Secondary | ICD-10-CM

## 2012-05-22 DIAGNOSIS — I10 Essential (primary) hypertension: Secondary | ICD-10-CM

## 2012-05-22 LAB — BASIC METABOLIC PANEL
CO2: 26 mEq/L (ref 19–32)
Calcium: 9.2 mg/dL (ref 8.4–10.5)
Creatinine, Ser: 1.1 mg/dL (ref 0.4–1.5)
GFR: 69.57 mL/min (ref >60.00)
Sodium: 139 mEq/L (ref 135–145)

## 2012-05-22 LAB — HEPATIC FUNCTION PANEL
ALT: 15 U/L (ref 0–53)
Albumin: 4 g/dL (ref 3.5–5.2)
Bilirubin, Direct: 0.1 mg/dL (ref 0.0–0.3)
Total Protein: 7.1 g/dL (ref 6.0–8.3)

## 2012-05-22 LAB — FERRITIN: Ferritin: 187.3 ng/mL (ref 22.0–322.0)

## 2012-05-22 LAB — IBC PANEL
Iron: 76 ug/dL (ref 42–165)
Transferrin: 230 mg/dL (ref 212.0–360.0)

## 2012-05-22 LAB — LIPID PANEL: Triglycerides: 266 mg/dL — ABNORMAL HIGH (ref 0.0–149.0)

## 2012-05-22 MED ORDER — SIMVASTATIN 20 MG PO TABS: 20.0000 mg | ORAL_TABLET | Freq: Every day | ORAL | Status: DC

## 2012-05-22 MED ORDER — MORPHINE SULFATE 15 MG PO TABS: 15.0000 mg | ORAL_TABLET | Freq: Three times a day (TID) | ORAL | Status: DC

## 2012-05-22 MED ORDER — LOSARTAN POTASSIUM 50 MG PO TABS: 50.0000 mg | ORAL_TABLET | Freq: Every day | ORAL | Status: DC

## 2012-05-22 NOTE — Assessment & Plan Note (Signed)
Improved with Daliresp.  Follow up with pulmonary.

## 2012-05-22 NOTE — Assessment & Plan Note (Signed)
Goal LDL < 70.  Monitor FLP and LFTs.  Good compliance with simvastatin.

## 2012-05-22 NOTE — Assessment & Plan Note (Addendum)
Continue morphine sulfate 15 mg tid.
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
## 2012-05-22 NOTE — Assessment & Plan Note (Signed)
Well controlled.   Continue losartan. BP: 134/72 mmHg  Lab Results  Component Value Date   CREATININE 1.3 03/15/2012

## 2012-05-23 LAB — LDL CHOLESTEROL, DIRECT: Direct LDL: 54.9 mg/dL

## 2012-05-27 ENCOUNTER — Other Ambulatory Visit: Payer: Self-pay | Admitting: *Deleted

## 2012-05-27 MED ORDER — ALPRAZOLAM 0.25 MG PO TABS: 0.2500 mg | ORAL_TABLET | Freq: Three times a day (TID) | ORAL | Status: DC | PRN

## 2012-06-27 ENCOUNTER — Encounter: Payer: Self-pay | Admitting: Critical Care Medicine

## 2012-06-27 ENCOUNTER — Ambulatory Visit (INDEPENDENT_AMBULATORY_CARE_PROVIDER_SITE_OTHER): Payer: Self-pay | Admitting: Critical Care Medicine

## 2012-06-27 VITALS — BP 110/60 | HR 73 | Temp 97.7°F | Ht 67.0 in | Wt 165.0 lb

## 2012-06-27 DIAGNOSIS — J449 Chronic obstructive pulmonary disease, unspecified: Secondary | ICD-10-CM

## 2012-06-27 MED ORDER — ROFLUMILAST 500 MCG PO TABS: 500.0000 ug | ORAL_TABLET | Freq: Every day | ORAL | Status: DC

## 2012-06-27 MED ORDER — BUDESONIDE-FORMOTEROL FUMARATE 160-4.5 MCG/ACT IN AERO: INHALATION_SPRAY | RESPIRATORY_TRACT | Status: DC

## 2012-06-27 NOTE — Patient Instructions (Addendum)
Flu vaccine today Stay on symbicort and nebullizer Return 4 months

## 2012-06-27 NOTE — Assessment & Plan Note (Signed)
Golds D. COPD with no recent exacerbations now on Daliresp daily Plan Flu vaccine today Stay on symbicort and nebullizer Return 4 months
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
## 2012-07-08 ENCOUNTER — Other Ambulatory Visit: Payer: Self-pay | Admitting: *Deleted

## 2012-07-08 MED ORDER — IPRATROPIUM BROMIDE 0.02 % IN SOLN: 0.5000 mg | Freq: Two times a day (BID) | RESPIRATORY_TRACT | Status: DC

## 2012-07-22 ENCOUNTER — Ambulatory Visit (INDEPENDENT_AMBULATORY_CARE_PROVIDER_SITE_OTHER): Payer: Self-pay | Admitting: Internal Medicine

## 2012-07-22 VITALS — BP 128/80 | HR 78 | Temp 98.3°F | Wt 171.0 lb

## 2012-07-22 DIAGNOSIS — M545 Low back pain, unspecified: Secondary | ICD-10-CM

## 2012-07-22 MED ORDER — MORPHINE SULFATE 15 MG PO TABS: 15.0000 mg | ORAL_TABLET | Freq: Three times a day (TID) | ORAL | Status: DC
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
## 2012-07-22 NOTE — Assessment & Plan Note (Signed)
No change in symptoms. He is tolerating morphine sulfate 15 mg 3 times daily. He denies constipation. No report of mental status change. Monitor urine drug screen.

## 2012-07-23 LAB — DRUG SCREEN, URINE
Amphetamine Screen, Ur: NEGATIVE
Barbiturate Quant, Ur: NEGATIVE
Cocaine Metabolites: NEGATIVE
Marijuana Metabolite: NEGATIVE
Methadone: NEGATIVE
Opiates: POSITIVE — AB

## 2012-09-10 ENCOUNTER — Ambulatory Visit (INDEPENDENT_AMBULATORY_CARE_PROVIDER_SITE_OTHER): Payer: Self-pay | Admitting: Adult Health

## 2012-09-10 ENCOUNTER — Encounter: Payer: Self-pay | Admitting: Adult Health

## 2012-09-10 VITALS — BP 116/74 | HR 65 | Temp 97.0°F | Ht 67.0 in | Wt 167.0 lb

## 2012-09-10 DIAGNOSIS — J449 Chronic obstructive pulmonary disease, unspecified: Secondary | ICD-10-CM

## 2012-09-10 MED ORDER — HYDROCODONE-HOMATROPINE 5-1.5 MG/5ML PO SYRP: 5.0000 mL | ORAL_SOLUTION | Freq: Four times a day (QID) | ORAL | Status: AC | PRN

## 2012-09-10 MED ORDER — DOXYCYCLINE HYCLATE 100 MG PO TABS: 100.0000 mg | ORAL_TABLET | Freq: Two times a day (BID) | ORAL | Status: DC

## 2012-09-10 MED ORDER — PREDNISONE 10 MG PO TABS: ORAL_TABLET | ORAL | Status: DC

## 2012-09-10 NOTE — Patient Instructions (Addendum)
Doxycycline 100mg  Twice daily  For 10  Days  Mucinex DM Twice daily  As needed  Cough/congestion  Prednisone taper over next week.  Fluids and rest  Please contact office for sooner follow up if symptoms do not improve or worsen or seek emergency care  follow up with Dr. Delford Field  In 6 weeks and As needed
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
## 2012-09-12 NOTE — Assessment & Plan Note (Signed)
Flare   Plan Doxycycline 100mg  Twice daily  For 10  Days  Mucinex DM Twice daily  As needed  Cough/congestion  Prednisone taper over next week.  Fluids and rest  Please contact office for sooner follow up if symptoms do not improve or worsen or seek emergency care  follow up with Dr. Delford Field  In 6 weeks and As needed

## 2012-09-18 ENCOUNTER — Other Ambulatory Visit: Payer: Self-pay | Admitting: Internal Medicine

## 2012-09-20 ENCOUNTER — Encounter: Payer: Self-pay | Admitting: Internal Medicine

## 2012-09-20 ENCOUNTER — Ambulatory Visit (INDEPENDENT_AMBULATORY_CARE_PROVIDER_SITE_OTHER): Payer: Self-pay | Admitting: Internal Medicine

## 2012-09-20 VITALS — BP 134/74 | HR 76 | Temp 98.3°F | Wt 162.0 lb

## 2012-09-20 DIAGNOSIS — J449 Chronic obstructive pulmonary disease, unspecified: Secondary | ICD-10-CM

## 2012-09-20 DIAGNOSIS — M545 Low back pain: Secondary | ICD-10-CM

## 2012-09-20 MED ORDER — ALPRAZOLAM 0.25 MG PO TABS: 0.2500 mg | ORAL_TABLET | Freq: Three times a day (TID) | ORAL | Status: DC | PRN

## 2012-09-20 MED ORDER — MORPHINE SULFATE 15 MG PO TABS: 15.0000 mg | ORAL_TABLET | Freq: Three times a day (TID) | ORAL | Status: DC

## 2012-09-20 NOTE — Patient Instructions (Signed)
Please complete the following lab tests before your next follow up appointment: BMET - 401.9 

## 2012-09-20 NOTE — Assessment & Plan Note (Signed)
Continue morphine sulfate 15 mg 3 times daily. Urine drug screen consistent with medication use.
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
## 2012-09-20 NOTE — Assessment & Plan Note (Signed)
Patient's recent flare responded nicely to doxycycline and prednisone taper. He still has mild wheezing on exam. Patient advised to continue his maintenance inhalers and contact our office should his symptoms worsen.

## 2012-09-24 ENCOUNTER — Telehealth: Payer: Self-pay | Admitting: Critical Care Medicine

## 2012-09-24 MED ORDER — HYDROCODONE-HOMATROPINE 5-1.5 MG/5ML PO SYRP: 5.0000 mL | ORAL_SOLUTION | Freq: Four times a day (QID) | ORAL | Status: DC | PRN

## 2012-09-24 MED ORDER — PREDNISONE 10 MG PO TABS: ORAL_TABLET | ORAL | Status: DC

## 2012-09-24 NOTE — Telephone Encounter (Signed)
Last OV on 09-10-12. Pt was given doxycycline x 10 days and pred taper. Pt states he finished abx on 09-19-12. He states he was feeling some better when he finished the meds, but symptoms were not completely gone and now they have begun to get worse again. Pt is c/o productive cough with yellow phlegm, hoarseness, chest tightness, wheezing, and increased SOB. He states the symptoms are not as bad as they were when he came in for OV, but are worse then when he was on abx and prednisone. Please advise.[name], CMA Allergies  Allergen Reactions  . Vancomycin     thrombocytopenia  . Cephalexin     REACTION: anaphylactic shock  . Moxifloxacin     REACTION: achilles tendon rupture  . Penicillins     REACTION: rash

## 2012-09-24 NOTE — Telephone Encounter (Signed)
Prednisone taper 10mg  #30  4 tabs for 3 days, then 3 tabs for 3 days, 2 tabs for 3 days, then 1 tab for 3 days, then stop mucinex DM Twice daily  As needed  Cough/congestion  Please contact office for sooner follow up if symptoms do not improve or worsen or seek emergency care  Make sure he has ov with Dr. Delford Field  For follow up in 1-2 weeks  ER if not improving or our office opens back up on Thursday

## 2012-09-24 NOTE — Telephone Encounter (Signed)
Pt is aware of Tammy's recommendations. They are requesting a cough syrup. Per TP they can have Hydromet 1-2 tsp Q 4-6hrs #270mL 0 refills. I have called this to the pt's pharmacy and his wife is aware.

## 2012-10-04 ENCOUNTER — Other Ambulatory Visit: Payer: Self-pay | Admitting: *Deleted

## 2012-10-04 MED ORDER — ALPRAZOLAM 0.25 MG PO TABS: 0.2500 mg | ORAL_TABLET | Freq: Three times a day (TID) | ORAL | Status: DC | PRN

## 2012-10-24 ENCOUNTER — Ambulatory Visit: Payer: Self-pay | Admitting: Critical Care Medicine

## 2012-10-30 ENCOUNTER — Other Ambulatory Visit: Payer: Self-pay

## 2012-10-30 ENCOUNTER — Ambulatory Visit: Payer: Self-pay | Admitting: Neurosurgery

## 2012-10-31 ENCOUNTER — Ambulatory Visit (INDEPENDENT_AMBULATORY_CARE_PROVIDER_SITE_OTHER): Payer: Self-pay | Admitting: Critical Care Medicine

## 2012-10-31 ENCOUNTER — Encounter: Payer: Self-pay | Admitting: Critical Care Medicine

## 2012-10-31 VITALS — BP 122/72 | HR 64 | Temp 98.1°F | Ht 67.0 in | Wt 160.0 lb

## 2012-10-31 DIAGNOSIS — J449 Chronic obstructive pulmonary disease, unspecified: Secondary | ICD-10-CM

## 2012-10-31 MED ORDER — PREDNISONE 10 MG PO TABS: ORAL_TABLET | ORAL | Status: DC

## 2012-10-31 MED ORDER — AZITHROMYCIN 250 MG PO TABS: 250.0000 mg | ORAL_TABLET | Freq: Every day | ORAL | Status: DC

## 2012-10-31 NOTE — Assessment & Plan Note (Signed)
Gold stage D. COPD with acute tracheobronchitis and exacerbation Plan Take azithromycin 250mg  Take two once then one daily until gone Take prednisone 10mg  Take 4 tablets daily for 5 days then stop Both meds above sent to pharmacy downstairs No change in inhalers/nebulizer Return 2 months

## 2012-10-31 NOTE — Patient Instructions (Addendum)
Take azithromycin 250mg  Take two once then one daily until gone Take prednisone 10mg  Take 4 tablets daily for 5 days then stop Both meds above sent to pharmacy downstairs No change in inhalers/nebulizer Return 2 months
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
## 2012-11-22 ENCOUNTER — Other Ambulatory Visit: Payer: Self-pay | Admitting: Internal Medicine

## 2012-11-28 ENCOUNTER — Other Ambulatory Visit: Payer: Self-pay | Admitting: Internal Medicine

## 2012-11-30 ENCOUNTER — Other Ambulatory Visit: Payer: Self-pay | Admitting: Internal Medicine

## 2012-12-03 ENCOUNTER — Encounter: Payer: Self-pay | Admitting: Neurosurgery

## 2012-12-04 ENCOUNTER — Encounter: Payer: Self-pay | Admitting: Neurosurgery

## 2012-12-04 ENCOUNTER — Other Ambulatory Visit (INDEPENDENT_AMBULATORY_CARE_PROVIDER_SITE_OTHER): Payer: Medicare Other | Admitting: Vascular Surgery

## 2012-12-04 ENCOUNTER — Other Ambulatory Visit: Payer: Self-pay | Admitting: Critical Care Medicine

## 2012-12-04 ENCOUNTER — Encounter (INDEPENDENT_AMBULATORY_CARE_PROVIDER_SITE_OTHER): Payer: Medicare Other | Admitting: Vascular Surgery

## 2012-12-04 ENCOUNTER — Ambulatory Visit (INDEPENDENT_AMBULATORY_CARE_PROVIDER_SITE_OTHER): Payer: Medicare Other | Admitting: Neurosurgery

## 2012-12-04 VITALS — BP 106/74 | HR 70 | Resp 16 | Ht 67.0 in | Wt 170.0 lb

## 2012-12-04 DIAGNOSIS — I714 Abdominal aortic aneurysm, without rupture: Secondary | ICD-10-CM

## 2012-12-04 DIAGNOSIS — I6529 Occlusion and stenosis of unspecified carotid artery: Secondary | ICD-10-CM

## 2012-12-04 MED ORDER — IPRATROPIUM BROMIDE 0.02 % IN SOLN: 0.5000 mg | Freq: Two times a day (BID) | RESPIRATORY_TRACT | Status: DC

## 2012-12-04 NOTE — Telephone Encounter (Signed)
PHARMACY NEEDED NEW RX WITH DX CODE FOR PTS ALBUTEROL . NEW RX SENT WITH DX CODE ON IT . NOTHING FURTHER NEEDED

## 2012-12-04 NOTE — Addendum Note (Signed)
Addended by: Melodye Ped C on: 12/04/2012 11:57 AM   Modules accepted: Orders
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
## 2012-12-12 ENCOUNTER — Other Ambulatory Visit (INDEPENDENT_AMBULATORY_CARE_PROVIDER_SITE_OTHER): Payer: Medicare Other

## 2012-12-12 DIAGNOSIS — I10 Essential (primary) hypertension: Secondary | ICD-10-CM

## 2012-12-12 LAB — BASIC METABOLIC PANEL
GFR: 55.14 mL/min — ABNORMAL LOW (ref >60.00)
Glucose, Bld: 90 mg/dL (ref 70–99)
Potassium: 4.3 mEq/L (ref 3.5–5.1)
Sodium: 140 mEq/L (ref 135–145)

## 2012-12-17 ENCOUNTER — Encounter: Payer: Self-pay | Admitting: Vascular Surgery

## 2012-12-18 ENCOUNTER — Ambulatory Visit
Admission: RE | Admit: 2012-12-18 | Discharge: 2012-12-18 | Disposition: A | Discharge status: Home / Self Care | Payer: Medicare Other | Source: Ambulatory Visit | Attending: Neurosurgery | Admitting: Neurosurgery

## 2012-12-18 ENCOUNTER — Ambulatory Visit (INDEPENDENT_AMBULATORY_CARE_PROVIDER_SITE_OTHER): Payer: Medicare Other | Admitting: Vascular Surgery

## 2012-12-18 ENCOUNTER — Encounter: Payer: Self-pay | Admitting: Vascular Surgery

## 2012-12-18 DIAGNOSIS — I714 Abdominal aortic aneurysm, without rupture: Secondary | ICD-10-CM

## 2012-12-18 MED ORDER — IOHEXOL 350 MG/ML SOLN
100.0000 mL | Freq: Once | INTRAVENOUS | Status: AC | PRN
  Administered 2012-12-18: 100 mL via INTRAVENOUS
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
## 2012-12-19 ENCOUNTER — Ambulatory Visit (INDEPENDENT_AMBULATORY_CARE_PROVIDER_SITE_OTHER): Payer: Medicare Other | Admitting: Internal Medicine

## 2012-12-19 ENCOUNTER — Encounter: Payer: Self-pay | Admitting: Internal Medicine

## 2012-12-19 ENCOUNTER — Other Ambulatory Visit: Payer: Self-pay | Admitting: *Deleted

## 2012-12-19 VITALS — BP 130/70 | HR 72 | Temp 97.8°F | Wt 166.0 lb

## 2012-12-19 DIAGNOSIS — I10 Essential (primary) hypertension: Secondary | ICD-10-CM

## 2012-12-19 DIAGNOSIS — M545 Low back pain, unspecified: Secondary | ICD-10-CM

## 2012-12-19 MED ORDER — MORPHINE SULFATE 15 MG PO TABS: 15.0000 mg | ORAL_TABLET | Freq: Three times a day (TID) | ORAL | Status: DC

## 2012-12-19 NOTE — Assessment & Plan Note (Signed)
Patient reports back pain is slightly worse. His serum creatinine slightly higher secondary to ibuprofen use. Patient advised discontinue all NSAIDs. Use acetaminophen 325 mg 3 times a day along with morphine sulfate.

## 2012-12-19 NOTE — Assessment & Plan Note (Signed)
Patient serum creatinine 1.4. This is likely secondary to NSAID use. Patient understands to discontinue all NSAIDs. No evidence of dehydration or hypotension. Repeat electrolytes and kidney function in 2 weeks. BP: 130/70 mmHg  Lab Results  Component Value Date   CREATININE 1.4 12/12/2012

## 2012-12-19 NOTE — Patient Instructions (Addendum)
Please complete the following lab test in 2 weeks: BMET - 401.9
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
## 2012-12-26 ENCOUNTER — Encounter: Payer: Self-pay | Admitting: Critical Care Medicine

## 2012-12-26 ENCOUNTER — Ambulatory Visit (INDEPENDENT_AMBULATORY_CARE_PROVIDER_SITE_OTHER): Payer: Medicare Other | Admitting: Critical Care Medicine

## 2012-12-26 VITALS — BP 108/68 | HR 74 | Temp 98.4°F | Ht 67.0 in | Wt 164.0 lb

## 2012-12-26 DIAGNOSIS — J449 Chronic obstructive pulmonary disease, unspecified: Secondary | ICD-10-CM

## 2012-12-26 NOTE — Assessment & Plan Note (Signed)
Gold stage D. COPD with frequent exacerbations, stable at this time Plan Continued inhaled medicines as prescribed
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
## 2012-12-26 NOTE — Patient Instructions (Signed)
No change in medications. Return in         4 months 

## 2013-01-10 ENCOUNTER — Other Ambulatory Visit: Payer: Self-pay | Admitting: Critical Care Medicine

## 2013-02-11 ENCOUNTER — Other Ambulatory Visit (INDEPENDENT_AMBULATORY_CARE_PROVIDER_SITE_OTHER): Payer: Medicare Other

## 2013-02-11 DIAGNOSIS — I10 Essential (primary) hypertension: Secondary | ICD-10-CM

## 2013-02-18 ENCOUNTER — Ambulatory Visit (INDEPENDENT_AMBULATORY_CARE_PROVIDER_SITE_OTHER): Payer: Medicare Other | Admitting: Internal Medicine

## 2013-02-18 ENCOUNTER — Encounter: Payer: Self-pay | Admitting: Internal Medicine

## 2013-02-18 VITALS — BP 112/64 | HR 76 | Temp 98.2°F | Wt 159.0 lb

## 2013-02-18 DIAGNOSIS — M545 Low back pain: Secondary | ICD-10-CM

## 2013-02-18 DIAGNOSIS — I10 Essential (primary) hypertension: Secondary | ICD-10-CM

## 2013-02-18 LAB — BASIC METABOLIC PANEL
BUN: 21 mg/dL (ref 6–23)
CO2: 25 mEq/L (ref 19–32)
Calcium: 9.6 mg/dL (ref 8.4–10.5)
Chloride: 108 mEq/L (ref 96–112)
Creatinine, Ser: 1.3 mg/dL (ref 0.4–1.5)
Glucose, Bld: 99 mg/dL (ref 70–99)

## 2013-02-18 MED ORDER — MORPHINE SULFATE 15 MG PO TABS: 15.0000 mg | ORAL_TABLET | Freq: Three times a day (TID) | ORAL | Status: DC

## 2013-02-18 MED ORDER — ALPRAZOLAM 0.25 MG PO TABS: 0.2500 mg | ORAL_TABLET | Freq: Three times a day (TID) | ORAL | Status: DC | PRN

## 2013-02-18 MED ORDER — AMITRIPTYLINE HCL 25 MG PO TABS: 25.0000 mg | ORAL_TABLET | Freq: Every day | ORAL | Status: DC

## 2013-02-18 MED ORDER — SIMVASTATIN 20 MG PO TABS: 20.0000 mg | ORAL_TABLET | Freq: Every day | ORAL | Status: DC

## 2013-02-18 NOTE — Patient Instructions (Addendum)
Please complete the following lab tests before your next follow up appointment: BMET - 401.9 FLP, LFTs - 272.4 

## 2013-02-18 NOTE — Assessment & Plan Note (Signed)
Blood pressure stable. Monitor electrodes and kidney function. BP: 112/64 mmHg  Lab Results  Component Value Date   CREATININE 1.3 02/18/2013

## 2013-02-18 NOTE — Assessment & Plan Note (Signed)
Continue same dose of morphine sulfate 15 mg 3 times a day. We discussed risk of switching to OxyContin due to his severe COPD. Continue same regimen.
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
## 2013-02-27 ENCOUNTER — Other Ambulatory Visit: Payer: Self-pay | Admitting: Cardiology

## 2013-03-21 ENCOUNTER — Ambulatory Visit: Payer: Medicare Other | Admitting: Internal Medicine

## 2013-04-01 ENCOUNTER — Other Ambulatory Visit: Payer: Self-pay | Admitting: Cardiology

## 2013-04-16 ENCOUNTER — Encounter (HOSPITAL_COMMUNITY): Payer: Self-pay | Admitting: Emergency Medicine

## 2013-04-16 ENCOUNTER — Emergency Department (HOSPITAL_COMMUNITY): Payer: Medicare Other

## 2013-04-16 ENCOUNTER — Inpatient Hospital Stay (HOSPITAL_COMMUNITY)
Admission: EM | Admit: 2013-04-16 | Discharge: 2013-04-18 | DRG: 191 | Disposition: A | Discharge status: Home / Self Care | Payer: Medicare Other | Attending: Internal Medicine | Admitting: Internal Medicine

## 2013-04-16 DIAGNOSIS — I10 Essential (primary) hypertension: Secondary | ICD-10-CM | POA: Diagnosis present

## 2013-04-16 DIAGNOSIS — I214 Non-ST elevation (NSTEMI) myocardial infarction: Secondary | ICD-10-CM

## 2013-04-16 DIAGNOSIS — I739 Peripheral vascular disease, unspecified: Secondary | ICD-10-CM

## 2013-04-16 DIAGNOSIS — M545 Low back pain: Secondary | ICD-10-CM

## 2013-04-16 DIAGNOSIS — E785 Hyperlipidemia, unspecified: Secondary | ICD-10-CM | POA: Diagnosis present

## 2013-04-16 DIAGNOSIS — N19 Unspecified kidney failure: Secondary | ICD-10-CM

## 2013-04-16 DIAGNOSIS — G43909 Migraine, unspecified, not intractable, without status migrainosus: Secondary | ICD-10-CM

## 2013-04-16 DIAGNOSIS — I2510 Atherosclerotic heart disease of native coronary artery without angina pectoris: Secondary | ICD-10-CM | POA: Diagnosis present

## 2013-04-16 DIAGNOSIS — E871 Hypo-osmolality and hyponatremia: Secondary | ICD-10-CM | POA: Diagnosis present

## 2013-04-16 DIAGNOSIS — R911 Solitary pulmonary nodule: Secondary | ICD-10-CM

## 2013-04-16 DIAGNOSIS — M7660 Achilles tendinitis, unspecified leg: Secondary | ICD-10-CM

## 2013-04-16 DIAGNOSIS — R011 Cardiac murmur, unspecified: Secondary | ICD-10-CM

## 2013-04-16 DIAGNOSIS — I701 Atherosclerosis of renal artery: Secondary | ICD-10-CM

## 2013-04-16 DIAGNOSIS — M899 Disorder of bone, unspecified: Secondary | ICD-10-CM | POA: Diagnosis present

## 2013-04-16 DIAGNOSIS — I252 Old myocardial infarction: Secondary | ICD-10-CM

## 2013-04-16 DIAGNOSIS — F17200 Nicotine dependence, unspecified, uncomplicated: Secondary | ICD-10-CM | POA: Diagnosis present

## 2013-04-16 DIAGNOSIS — D696 Thrombocytopenia, unspecified: Secondary | ICD-10-CM

## 2013-04-16 DIAGNOSIS — I714 Abdominal aortic aneurysm, without rupture: Secondary | ICD-10-CM

## 2013-04-16 DIAGNOSIS — J449 Chronic obstructive pulmonary disease, unspecified: Secondary | ICD-10-CM

## 2013-04-16 DIAGNOSIS — N179 Acute kidney failure, unspecified: Secondary | ICD-10-CM | POA: Diagnosis present

## 2013-04-16 DIAGNOSIS — H349 Unspecified retinal vascular occlusion: Secondary | ICD-10-CM

## 2013-04-16 DIAGNOSIS — J441 Chronic obstructive pulmonary disease with (acute) exacerbation: Principal | ICD-10-CM | POA: Diagnosis present

## 2013-04-16 DIAGNOSIS — R0902 Hypoxemia: Secondary | ICD-10-CM | POA: Diagnosis present

## 2013-04-16 DIAGNOSIS — I711 Thoracic aortic aneurysm, ruptured: Secondary | ICD-10-CM

## 2013-04-16 DIAGNOSIS — Z87448 Personal history of other diseases of urinary system: Secondary | ICD-10-CM

## 2013-04-16 DIAGNOSIS — IMO0001 Reserved for inherently not codable concepts without codable children: Secondary | ICD-10-CM

## 2013-04-16 DIAGNOSIS — R000 Tachycardia, unspecified: Secondary | ICD-10-CM | POA: Diagnosis present

## 2013-04-16 DIAGNOSIS — D649 Anemia, unspecified: Secondary | ICD-10-CM | POA: Diagnosis present

## 2013-04-16 DIAGNOSIS — I255 Ischemic cardiomyopathy: Secondary | ICD-10-CM

## 2013-04-16 DIAGNOSIS — I679 Cerebrovascular disease, unspecified: Secondary | ICD-10-CM

## 2013-04-16 LAB — COMPREHENSIVE METABOLIC PANEL
ALT: 12 U/L (ref 0–53)
AST: 18 U/L (ref 0–37)
Albumin: 3.6 g/dL (ref 3.5–5.2)
Alkaline Phosphatase: 119 U/L — ABNORMAL HIGH (ref 39–117)
BUN: 74 mg/dL — ABNORMAL HIGH (ref 6–23)
CO2: 20 mEq/L (ref 19–32)
Calcium: 8.9 mg/dL (ref 8.4–10.5)
Chloride: 92 mEq/L — ABNORMAL LOW (ref 96–112)
Creatinine, Ser: 4.02 mg/dL — ABNORMAL HIGH (ref 0.50–1.35)
GFR calc Af Amer: 17 mL/min — ABNORMAL LOW (ref >90)
GFR calc non Af Amer: 14 mL/min — ABNORMAL LOW (ref >90)
Glucose, Bld: 103 mg/dL — ABNORMAL HIGH (ref 70–99)
Potassium: 4.1 mEq/L (ref 3.5–5.1)
Sodium: 129 mEq/L — ABNORMAL LOW (ref 135–145)
Total Bilirubin: 0.4 mg/dL (ref 0.3–1.2)
Total Protein: 7.2 g/dL (ref 6.0–8.3)

## 2013-04-16 LAB — CBC
HCT: 36 % — ABNORMAL LOW (ref 39.0–52.0)
Hemoglobin: 12.2 g/dL — ABNORMAL LOW (ref 13.0–17.0)
MCH: 30.5 pg (ref 26.0–34.0)
MCHC: 33.9 g/dL (ref 30.0–36.0)
MCV: 90 fL (ref 78.0–100.0)
Platelets: 138 10*3/uL — ABNORMAL LOW (ref 150–400)
RBC: 4 MIL/uL — ABNORMAL LOW (ref 4.22–5.81)
RDW: 13.1 % (ref 11.5–15.5)
WBC: 8.3 10*3/uL (ref 4.0–10.5)

## 2013-04-16 MED ORDER — ALBUTEROL SULFATE (5 MG/ML) 0.5% IN NEBU
5.0000 mg | INHALATION_SOLUTION | Freq: Once | RESPIRATORY_TRACT | Status: AC
  Administered 2013-04-16: 5 mg via RESPIRATORY_TRACT
  Filled 2013-04-16: qty 1

## 2013-04-16 MED ORDER — ALBUTEROL (5 MG/ML) CONTINUOUS INHALATION SOLN
10.0000 mg/h | INHALATION_SOLUTION | RESPIRATORY_TRACT | Status: AC
  Administered 2013-04-16: 10 mg/h via RESPIRATORY_TRACT
  Filled 2013-04-16: qty 20

## 2013-04-16 MED ORDER — SODIUM CHLORIDE 0.9 % IV BOLUS (SEPSIS)
1000.0000 mL | Freq: Once | INTRAVENOUS | Status: AC
  Administered 2013-04-16: 1000 mL via INTRAVENOUS

## 2013-04-16 MED ORDER — IPRATROPIUM BROMIDE 0.02 % IN SOLN
0.5000 mg | Freq: Once | RESPIRATORY_TRACT | Status: AC
  Administered 2013-04-16: 0.5 mg via RESPIRATORY_TRACT
  Filled 2013-04-16: qty 2.5

## 2013-04-16 NOTE — ED Notes (Signed)
[id number]<AV> Expected date:<BR> Expected time:<BR> Means of arrival:<BR> Comments:<BR> EMS, 62yo M; SHOB

## 2013-04-16 NOTE — ED Notes (Signed)
Brought in by EMS from home with c/o shortness of breath.  Per EMS, pt's O2 sat was 81% on room air on their arrival; pt was given Albuterol neb tx with a total of 15mg ; pt was also given Solu-Medrol 125 mg IV.  Pt has hx of COPD; pt's shortness of breath, onset last night.

## 2013-04-17 ENCOUNTER — Inpatient Hospital Stay (HOSPITAL_COMMUNITY): Payer: Medicare Other

## 2013-04-17 ENCOUNTER — Ambulatory Visit: Payer: Medicare Other | Admitting: Internal Medicine

## 2013-04-17 DIAGNOSIS — J449 Chronic obstructive pulmonary disease, unspecified: Secondary | ICD-10-CM

## 2013-04-17 DIAGNOSIS — I2589 Other forms of chronic ischemic heart disease: Secondary | ICD-10-CM

## 2013-04-17 DIAGNOSIS — J441 Chronic obstructive pulmonary disease with (acute) exacerbation: Principal | ICD-10-CM

## 2013-04-17 DIAGNOSIS — N19 Unspecified kidney failure: Secondary | ICD-10-CM

## 2013-04-17 DIAGNOSIS — N179 Acute kidney failure, unspecified: Secondary | ICD-10-CM

## 2013-04-17 LAB — URINALYSIS, ROUTINE W REFLEX MICROSCOPIC
Bilirubin Urine: NEGATIVE
Glucose, UA: NEGATIVE mg/dL
Ketones, ur: NEGATIVE mg/dL
pH: 5 (ref 5.0–8.0)

## 2013-04-17 LAB — BASIC METABOLIC PANEL
Calcium: 8.6 mg/dL (ref 8.4–10.5)
Creatinine, Ser: 3.56 mg/dL — ABNORMAL HIGH (ref 0.50–1.35)
GFR calc non Af Amer: 17 mL/min — ABNORMAL LOW (ref >90)
Glucose, Bld: 208 mg/dL — ABNORMAL HIGH (ref 70–99)
Sodium: 128 mEq/L — ABNORMAL LOW (ref 135–145)

## 2013-04-17 LAB — CBC
MCH: 30.9 pg (ref 26.0–34.0)
MCV: 89.2 fL (ref 78.0–100.0)
Platelets: 124 10*3/uL — ABNORMAL LOW (ref 150–400)
RBC: 3.69 MIL/uL — ABNORMAL LOW (ref 4.22–5.81)
RDW: 13 % (ref 11.5–15.5)
WBC: 6.4 10*3/uL (ref 4.0–10.5)

## 2013-04-17 LAB — BLOOD GAS, ARTERIAL
Drawn by: 244901
O2 Content: 4 L/min
Patient temperature: 98.4
pCO2 arterial: 33.9 mmHg — ABNORMAL LOW (ref 35.0–45.0)
pH, Arterial: 7.276 — ABNORMAL LOW (ref 7.350–7.450)

## 2013-04-17 LAB — GLUCOSE, CAPILLARY: Glucose-Capillary: 198 mg/dL — ABNORMAL HIGH (ref 70–99)

## 2013-04-17 LAB — ETHANOL: Alcohol, Ethyl (B): <11 mg/dL (ref 0–11)

## 2013-04-17 LAB — IRON AND TIBC
Saturation Ratios: 9 % — ABNORMAL LOW (ref 20–55)
TIBC: 244 ug/dL (ref 215–435)

## 2013-04-17 MED ORDER — ENOXAPARIN SODIUM 30 MG/0.3ML ~~LOC~~ SOLN
30.0000 mg | SUBCUTANEOUS | Status: DC
  Administered 2013-04-17 – 2013-04-18 (×2): 30 mg via SUBCUTANEOUS
  Filled 2013-04-17 (×2): qty 0.3

## 2013-04-17 MED ORDER — NICOTINE 21 MG/24HR TD PT24
21.0000 mg | MEDICATED_PATCH | Freq: Every day | TRANSDERMAL | Status: DC
  Administered 2013-04-17 – 2013-04-18 (×3): 21 mg via TRANSDERMAL
  Filled 2013-04-17 (×3): qty 1

## 2013-04-17 MED ORDER — ALBUTEROL SULFATE (5 MG/ML) 0.5% IN NEBU
2.5000 mg | INHALATION_SOLUTION | RESPIRATORY_TRACT | Status: DC | PRN
  Administered 2013-04-17: 2.5 mg via RESPIRATORY_TRACT
  Filled 2013-04-17: qty 0.5

## 2013-04-17 MED ORDER — ONDANSETRON HCL 4 MG PO TABS: 4.0000 mg | ORAL_TABLET | Freq: Four times a day (QID) | ORAL | Status: DC | PRN

## 2013-04-17 MED ORDER — DOXYCYCLINE HYCLATE 100 MG PO TABS
100.0000 mg | ORAL_TABLET | Freq: Two times a day (BID) | ORAL | Status: DC
  Administered 2013-04-17 – 2013-04-18 (×3): 100 mg via ORAL
  Filled 2013-04-17 (×4): qty 1

## 2013-04-17 MED ORDER — ALBUTEROL SULFATE (5 MG/ML) 0.5% IN NEBU
2.5000 mg | INHALATION_SOLUTION | Freq: Four times a day (QID) | RESPIRATORY_TRACT | Status: DC
  Administered 2013-04-17 – 2013-04-18 (×5): 2.5 mg via RESPIRATORY_TRACT
  Filled 2013-04-17 (×5): qty 0.5

## 2013-04-17 MED ORDER — BUDESONIDE-FORMOTEROL FUMARATE 160-4.5 MCG/ACT IN AERO
2.0000 | INHALATION_SPRAY | Freq: Two times a day (BID) | RESPIRATORY_TRACT | Status: DC
  Administered 2013-04-17 – 2013-04-18 (×3): 2 via RESPIRATORY_TRACT
  Filled 2013-04-17: qty 6

## 2013-04-17 MED ORDER — ALUM & MAG HYDROXIDE-SIMETH 200-200-20 MG/5ML PO SUSP
30.0000 mL | Freq: Four times a day (QID) | ORAL | Status: DC | PRN
  Administered 2013-04-17: 30 mL via ORAL
  Filled 2013-04-17: qty 30

## 2013-04-17 MED ORDER — ROFLUMILAST 500 MCG PO TABS
500.0000 ug | ORAL_TABLET | Freq: Every day | ORAL | Status: DC
  Administered 2013-04-17 – 2013-04-18 (×2): 500 ug via ORAL
  Filled 2013-04-17 (×2): qty 1

## 2013-04-17 MED ORDER — ASPIRIN 325 MG PO TABS
325.0000 mg | ORAL_TABLET | Freq: Every day | ORAL | Status: DC
  Administered 2013-04-17 – 2013-04-18 (×2): 325 mg via ORAL
  Filled 2013-04-17 (×2): qty 1

## 2013-04-17 MED ORDER — AMITRIPTYLINE HCL 25 MG PO TABS
25.0000 mg | ORAL_TABLET | Freq: Every day | ORAL | Status: DC
  Administered 2013-04-17 (×2): 25 mg via ORAL
  Filled 2013-04-17 (×3): qty 1

## 2013-04-17 MED ORDER — CLOPIDOGREL BISULFATE 75 MG PO TABS
75.0000 mg | ORAL_TABLET | Freq: Every day | ORAL | Status: DC
  Administered 2013-04-17 – 2013-04-18 (×2): 75 mg via ORAL
  Filled 2013-04-17 (×3): qty 1

## 2013-04-17 MED ORDER — ALBUTEROL SULFATE HFA 108 (90 BASE) MCG/ACT IN AERS
2.0000 | INHALATION_SPRAY | Freq: Four times a day (QID) | RESPIRATORY_TRACT | Status: DC | PRN
  Filled 2013-04-17: qty 6.7

## 2013-04-17 MED ORDER — ACETAMINOPHEN 650 MG RE SUPP: 650.0000 mg | Freq: Four times a day (QID) | RECTAL | Status: DC | PRN

## 2013-04-17 MED ORDER — SODIUM CHLORIDE 0.9 % IV SOLN
INTRAVENOUS | Status: DC
  Administered 2013-04-17 – 2013-04-18 (×3): via INTRAVENOUS

## 2013-04-17 MED ORDER — SIMVASTATIN 20 MG PO TABS
20.0000 mg | ORAL_TABLET | Freq: Every day | ORAL | Status: DC
  Administered 2013-04-17 (×2): 20 mg via ORAL
  Filled 2013-04-17 (×3): qty 1

## 2013-04-17 MED ORDER — MORPHINE SULFATE 15 MG PO TABS
15.0000 mg | ORAL_TABLET | Freq: Three times a day (TID) | ORAL | Status: DC
  Administered 2013-04-17 – 2013-04-18 (×4): 15 mg via ORAL
  Filled 2013-04-17 (×4): qty 1

## 2013-04-17 MED ORDER — POLYETHYLENE GLYCOL 3350 17 G PO PACK
17.0000 g | PACK | ORAL | Status: DC | PRN
  Filled 2013-04-17: qty 1

## 2013-04-17 MED ORDER — ALPRAZOLAM 0.25 MG PO TABS
0.2500 mg | ORAL_TABLET | Freq: Three times a day (TID) | ORAL | Status: DC | PRN
  Administered 2013-04-17: 0.25 mg via ORAL
  Filled 2013-04-17: qty 1

## 2013-04-17 MED ORDER — SODIUM CHLORIDE 0.9 % IJ SOLN
3.0000 mL | Freq: Two times a day (BID) | INTRAMUSCULAR | Status: DC
  Administered 2013-04-17: 3 mL via INTRAVENOUS

## 2013-04-17 MED ORDER — METHYLPREDNISOLONE SODIUM SUCC 125 MG IJ SOLR
60.0000 mg | Freq: Four times a day (QID) | INTRAMUSCULAR | Status: DC
  Administered 2013-04-17 (×2): 60 mg via INTRAVENOUS
  Filled 2013-04-17 (×5): qty 0.96

## 2013-04-17 MED ORDER — BISOPROLOL FUMARATE 5 MG PO TABS
5.0000 mg | ORAL_TABLET | Freq: Every day | ORAL | Status: DC
  Administered 2013-04-17: 5 mg via ORAL
  Filled 2013-04-17: qty 1

## 2013-04-17 MED ORDER — PANTOPRAZOLE SODIUM 40 MG PO TBEC
40.0000 mg | DELAYED_RELEASE_TABLET | Freq: Every day | ORAL | Status: DC
  Administered 2013-04-17 – 2013-04-18 (×2): 40 mg via ORAL
  Filled 2013-04-17: qty 1

## 2013-04-17 MED ORDER — ACETAMINOPHEN 325 MG PO TABS
650.0000 mg | ORAL_TABLET | Freq: Four times a day (QID) | ORAL | Status: DC | PRN
  Administered 2013-04-17 (×2): 650 mg via ORAL
  Filled 2013-04-17 (×2): qty 2

## 2013-04-17 MED ORDER — METHYLPREDNISOLONE SODIUM SUCC 125 MG IJ SOLR
60.0000 mg | Freq: Two times a day (BID) | INTRAMUSCULAR | Status: DC
  Filled 2013-04-17: qty 0.96

## 2013-04-17 MED ORDER — IPRATROPIUM BROMIDE 0.02 % IN SOLN
0.5000 mg | Freq: Four times a day (QID) | RESPIRATORY_TRACT | Status: DC | PRN
  Administered 2013-04-17: 0.5 mg via RESPIRATORY_TRACT
  Filled 2013-04-17: qty 2.5

## 2013-04-17 MED ORDER — IPRATROPIUM BROMIDE 0.02 % IN SOLN
0.5000 mg | Freq: Four times a day (QID) | RESPIRATORY_TRACT | Status: DC
  Administered 2013-04-17 – 2013-04-18 (×5): 0.5 mg via RESPIRATORY_TRACT
  Filled 2013-04-17 (×5): qty 2.5

## 2013-04-17 MED ORDER — ONDANSETRON HCL 4 MG/2ML IJ SOLN: 4.0000 mg | Freq: Four times a day (QID) | INTRAMUSCULAR | Status: DC | PRN

## 2013-04-17 NOTE — Care Management Note (Addendum)
    Page 1 of 1   04/18/2013     10:17:50 AM   CARE MANAGEMENT NOTE 04/18/2013  Patient:  [name], [name]   Account Number:  [id number]  Date Initiated:  04/17/2013  Documentation initiated by:  [facility]  Subjective/Objective Assessment:   ADMITTED W/SOB.COPD.     Action/Plan:   FROM HOME W/SPOUSE.HAS PCP,PHARMACY.   Anticipated DC Date:  04/18/2013   Anticipated DC Plan:  HOME/SELF CARE      DC Planning Services  CM consult      Choice offered to / List presented to:             Status of service:  Completed, signed off Medicare Important Message given?   (If response is "NO", the following Medicare IM given date fields will be blank) Date Medicare IM given:   Date Additional Medicare IM given:    Discharge Disposition:  HOME/SELF CARE  Per UR Regulation:  Reviewed for med. necessity/level of care/duration of stay  If discussed at Long Length of Stay Meetings, dates discussed:    Comments:  04/18/13 Malene Blaydes RN,BSN NCM [phone] D/C HOME NO ORDERS OR NEEDS.  04/17/13 Angello Chien RN,BSN NCM [phone] NOTED LOW 02 SATS 80'S. IF HOME 02 NEEDED CAN ARRANGE IF NEEDED.

## 2013-04-17 NOTE — Progress Notes (Signed)
Nutrition Brief Note  RD consulted to assess pt due to poor PO intake  Body mass index is 25.17 kg/(m^2). Patient meets criteria for Overweight based on current BMI. Pt states he is at his usual body weight of 160 lbs.  Current diet order is Heart Healthy, patient is consuming approximately 100% of meals at this time. Pt reports that his appetite has been good and he was eating 2 meals daily PTA which is normal for him. Pt has no questions or concerns; pt wanting to go home. Labs and medications reviewed.   No nutrition interventions warranted at this time. If nutrition issues arise, please re-consult RD.   Ian Malkin RD, LDN Inpatient Clinical Dietitian Pager: [phone] After Hours Pager: [phone]

## 2013-04-17 NOTE — Evaluation (Signed)
Occupational Therapy Evaluation Patient Details Name: LERONE ONDER MRN: [id number] DOB: 1951-03-14 Today's Date: 04/17/2013 Time: 1478-2956 OT Time Calculation (min): 29 min  OT Assessment / Plan / Recommendation History of present illness ISOM KOCHAN is a 62 y.o. male with prior h/o tobacco abuse, copd, AAA, hypertension, alcoholic hepaptis, CAD, was brought in by EMS for sob, worsened and wheezing. On arrival to ED, he was found hypoxic, tachycardic, with oxygen sat's in 80's, with diffuse bilateral wheezes. He was put on nasal oxygen and his sats improved to 92%. His cxr revealed cardiomegaly. He was also found to be in acute renal failure. He is referred to medical service for copd exacerbation and acute renal failure   Clinical Impression   Patient evaluated by Occupational Therapy with no further acute OT needs identified. All education has been completed and the patient has no further questions. See below for any follow-up Occupational Therapy or equipment needs. OT is signing off. Thank you for this referral.     OT Assessment  Patient does not need any further OT services    Follow Up Recommendations  No OT follow up    Barriers to Discharge      Equipment Recommendations  None recommended by OT    Recommendations for Other Services    Frequency       Precautions / Restrictions Precautions Precautions: None Restrictions Weight Bearing Restrictions: No   Pertinent Vitals/Pain Pt initially with 02 sats 95% on 3L.   No dyspnea with activity so 02 decreased to 2L and maintained at 94-95%; Pt moved to RA and maintained sats 93-94%. RN notified and pt left on RA    ADL  Eating/Feeding: Independent Where Assessed - Eating/Feeding: Edge of bed Grooming: Wash/dry hands;Wash/dry face;Teeth care;Denture care;Modified independent Where Assessed - Grooming: Unsupported standing Upper Body Bathing: Set up Where Assessed - Upper Body Bathing: Unsupported sitting Lower Body  Bathing: Set up Where Assessed - Lower Body Bathing: Unsupported sit to stand Upper Body Dressing: Set up Where Assessed - Upper Body Dressing: Unsupported sitting Lower Body Dressing: Set up Where Assessed - Lower Body Dressing: Unsupported sit to stand Toilet Transfer: Modified independent Toilet Transfer Method: Sit to stand;Stand pivot Acupuncturist: Comfort height toilet Toileting - Clothing Manipulation and Hygiene: Modified independent Where Assessed - Engineer, mining and Hygiene: Standing Tub/Shower Transfer: Modified independent Tub/Shower Transfer Method: Ambulating Transfers/Ambulation Related to ADLs: independent ADL Comments: Pt approximating baseline    OT Diagnosis:    OT Problem List:   OT Treatment Interventions:     OT Goals(Current goals can be found in the care plan section) Acute Rehab OT Goals Patient Stated Goal: to go home  Visit Information  Last OT Received On: 04/17/13 Assistance Needed: +1 History of Present Illness: FLYNN GWYN is a 62 y.o. male with prior h/o tobacco abuse, copd, AAA, hypertension, alcoholic hepaptis, CAD, was brought in by EMS for sob, worsened and wheezing. On arrival to ED, he was found hypoxic, tachycardic, with oxygen sat's in 80's, with diffuse bilateral wheezes. He was put on nasal oxygen and his sats improved to 92%. His cxr revealed cardiomegaly. He was also found to be in acute renal failure. He is referred to medical service for copd exacerbation and acute renal failure       Prior Functioning     Home Living Family/patient expects to be discharged to:: Private residence Living Arrangements: Spouse/significant other Available Help at Discharge: Family;Available PRN/intermittently Type of Home: Apartment Home  Access: Level entry Home Layout: One level Home Equipment: Environmental consultant - 2 wheels (wife has) Additional Comments: Pt does not use 02 at home Prior Function Level of Independence:  Independent Comments: Retired Curator; Passenger transport manager: No difficulties         Vision/Perception     Copywriter, advertising Arousal/Alertness: Awake/alert Behavior During Therapy: WFL for tasks assessed/performed Overall Cognitive Status: Within Functional Limits for tasks assessed    Extremity/Trunk Assessment Upper Extremity Assessment Upper Extremity Assessment: Overall WFL for tasks assessed Cervical / Trunk Assessment Cervical / Trunk Assessment: Normal     Mobility Bed Mobility Bed Mobility: Not assessed Transfers Transfers: Sit to Stand;Stand to Sit Sit to Stand: 7: Independent Stand to Sit: 7: Independent     Exercise     Balance     End of Session OT - End of Session Activity Tolerance: Patient tolerated treatment well Patient left: in bed;with nursing/sitter in room (EOB) Nurse Communication: Other (comment) (02 sats 94 on RA)  GO     Kenzey Birkland M 04/17/2013, 9:53 AM

## 2013-04-17 NOTE — Progress Notes (Signed)
PT Cancellation Note  Patient Details Name: [name] MRN: [id number] DOB: [birth year]   Cancelled Treatment:    Reason Eval/Treat Not Completed: PT screened, no needs identified, will sign off   [facility] 04/17/2013, 4:26 PM
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
## 2013-04-17 NOTE — Progress Notes (Signed)
Pts admitting bp was low at 87/58, and it had been running low in the ED.  His tele monitor also showed vent. Trigeminy right around the time he was receiving a breathing tx.  Bp was retaken after RT was done with pt and he had gotten settled, but it was still low at 92/59.  MD notified, and the NP on call came to floor. Pt is currently asymptomatic and his rhythm has gone back to NSR with pvcs.  No new orders at this time, will continue to monitor pt and repage MD on call if BP remains low.

## 2013-04-17 NOTE — Progress Notes (Signed)
Inpatient Diabetes Program Recommendations  AACE/ADA: New Consensus Statement on Inpatient Glycemic Control (2013)  Target Ranges:  Prepandial:   less than 140 mg/dL      Peak postprandial:   less than 180 mg/dL (1-2 hours)      Critically ill patients:  140 - 180 mg/dL   Reason for Visit: Elevated Lab Glucose  Results for CHAN, SHEAHAN (MRN [id number]) as of 04/17/2013 10:27  Ref. Range 04/17/2013 02:30  Glucose Latest Range: 70-99 mg/dL 782 (H)   Results for MAURI, TOLEN (MRN [id number]) as of 04/17/2013 10:27  Ref. Range 04/17/2013 08:32  Glucose-Capillary Latest Range: 70-99 mg/dL 578 (H)   Inpatient Diabetes Program Recommendations Correction (SSI): Add Novolog sensitive tidwc HgbA1C: Check HgbA1C to assess glycemic control prior to hospitalization Diet: Add CHO mod to heart healthy diet  Note: Will follow.
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
## 2013-04-17 NOTE — Progress Notes (Signed)
Patient seen and examined. Admitted earlier today for SOB and found to have ARF. Cr improving after IVF and cessation of ARB. No longer SOB. No wheezes on exam. Will titrate steroids and continue IVF. Potential DC home in am if Cr continues downward trend. Will continue to follow.  Peggye Pitt, MD [facility] Pager: [phone]

## 2013-04-18 ENCOUNTER — Other Ambulatory Visit: Payer: Self-pay

## 2013-04-18 DIAGNOSIS — J441 Chronic obstructive pulmonary disease with (acute) exacerbation: Secondary | ICD-10-CM

## 2013-04-18 DIAGNOSIS — Z87448 Personal history of other diseases of urinary system: Secondary | ICD-10-CM

## 2013-04-18 DIAGNOSIS — N179 Acute kidney failure, unspecified: Secondary | ICD-10-CM

## 2013-04-18 LAB — CREATININE, URINE, RANDOM: Creatinine, Urine: 86.8 mg/dL

## 2013-04-18 LAB — BASIC METABOLIC PANEL
BUN: 69 mg/dL — ABNORMAL HIGH (ref 6–23)
Chloride: 102 mEq/L (ref 96–112)
Creatinine, Ser: 1.73 mg/dL — ABNORMAL HIGH (ref 0.50–1.35)
GFR calc Af Amer: 46 mL/min — ABNORMAL LOW (ref >90)
GFR calc non Af Amer: 40 mL/min — ABNORMAL LOW (ref >90)
Glucose, Bld: 154 mg/dL — ABNORMAL HIGH (ref 70–99)

## 2013-04-18 LAB — CBC
HCT: 30.4 % — ABNORMAL LOW (ref 39.0–52.0)
MCH: 31 pg (ref 26.0–34.0)
MCHC: 34.9 g/dL (ref 30.0–36.0)
RDW: 13.2 % (ref 11.5–15.5)

## 2013-04-18 LAB — PARATHYROID HORMONE, INTACT (NO CA): PTH: 197.2 pg/mL — ABNORMAL HIGH (ref 14.0–72.0)

## 2013-04-18 MED ORDER — PREDNISONE (PAK) 10 MG PO TABS: 10.0000 mg | ORAL_TABLET | Freq: Every day | ORAL | Status: DC

## 2013-04-18 NOTE — ED Provider Notes (Signed)
Medical screening examination/treatment/procedure(s) were performed by non-physician practitioner and as supervising physician I was immediately available for consultation/collaboration.  Raeford Razor, MD 04/18/13 1455

## 2013-04-18 NOTE — Discharge Summary (Signed)
Physician Discharge Summary  [name]:[id number] DOB: 1951-09-11 DOA: 04/16/2013  PCP: Jason Lemons, DO  Admit date: 04/16/2013 Discharge date: 04/18/2013  Time spent: Greater than 30 minutes  Recommendations for Outpatient Follow-up:  -Advised to follow up with his PCP in 3-4 days for repeat BMET to follow renal function.   Discharge Diagnoses:  Principal Problem:   COPD with acute exacerbation Active Problems:   HYPERLIPIDEMIA   HYPERTENSION   ARF (acute renal failure)   Discharge Condition: Stable and Improved  Filed Weights   04/17/13 0200  Weight: 72.9 kg (160 lb 11.5 oz)    History of present illness:  Patient is a 62 y.o. male with prior h/o tobacco abuse, copd, AAA, hypertension, alcoholic hepaptis, CAD, was brought in by EMS for sob, worsened and wheezing. On arrival to ED, he was found hypoxic, tachycardic, with oxygen sat's in 80's, with diffuse bilateral wheezes. He was put on nasal oxygen and his sats improved to 92%. His cxr revealed cardiomegaly. He was also found to be in acute renal failure. He is referred to medical service for copd exacerbation and acute renal failure. Renal consult obtained by EDP.    Hospital Course:   COPD with Acute Exacerbation -Improved. -No wheezing on exam. -No current oxygen requirements. -Has been ambulating down the hallways without distress. -Will DC home on prednisone taper.  ARF -Presumed 2/2 ARB effect. -Cr was 4.02 on admission, down to 1.73 on DC with IVF and cessation of ARB.  Rest of chronic medical conditions have been stable this hospitalization.  Procedures:  None   Consultations:  Renal, Dr. Detterding  Discharge Instructions      Discharge Orders   Future Appointments Provider Department Dept Phone   05/01/2013 1:45 PM Storm Frisk, MD [facility] [phone]   05/13/2013 9:45 AM [facility] [place] [phone]   05/20/2013 2:15 PM Doe-Hyun Sherran Needs, DO  [facility] [place] [phone]   07/02/2013 11:00 AM [facility] [place] [facility] [phone]   07/02/2013 12:00 PM [facility] [phone]   07/02/2013 1:15 PM Chuck Hint, MD [facility] [phone]   Future Orders Complete By Expires     Diet - low sodium heart healthy  As directed     Discontinue IV  As directed     Increase activity slowly  As directed         Medication List    STOP taking these medications       losartan 50 MG tablet  Commonly known as:  COZAAR      TAKE these medications       albuterol 108 (90 BASE) MCG/ACT inhaler  Commonly known as:  PROVENTIL HFA;VENTOLIN HFA  Inhale 2 puffs into the lungs every 6 (six) hours as needed.     albuterol (5 MG/ML) 0.5% nebulizer solution  Commonly known as:  PROVENTIL  Take 2.5 mg by nebulization 2 (two) times daily.     ALPRAZolam 0.25 MG tablet  Commonly known as:  XANAX  Take 1 tablet (0.25 mg total) by mouth every 8 (eight) hours as needed.     amitriptyline 25 MG tablet  Commonly known as:  ELAVIL  Take 1 tablet (25 mg total) by mouth at bedtime.     aspirin 325 MG tablet  Take 325 mg by mouth daily.     b complex vitamins tablet  Take  1 tablet by mouth daily.     bisoprolol 5 MG tablet  Commonly known as:  ZEBETA  TAKE ONE TABLET BY MOUTH ONCE DAILY     budesonide-formoterol 160-4.5 MCG/ACT inhaler  Commonly known as:  SYMBICORT  Take 2 puffs first thing in am and then another 2 puffs about 12 hours later.     clopidogrel 75 MG tablet  Commonly known as:  PLAVIX  TAKE ONE TABLET BY MOUTH ONCE DAILY WITH BREAKFAST     ipratropium 0.02 % nebulizer solution  Commonly known as:  ATROVENT  Take 2.5 mLs (0.5 mg total) by nebulization 2 (two) times daily.     isosorbide dinitrate 30 MG tablet  Commonly known as:  ISORDIL  TAKE ONE-HALF TABLET BY MOUTH EVERY DAY      Krill Oil 1000 MG Caps  Take 1 capsule by mouth daily.     morphine 15 MG tablet  Commonly known as:  MSIR  Take 1 tablet (15 mg total) by mouth every 8 (eight) hours.     multivitamin per tablet  Take 1 tablet by mouth daily.     polyethylene glycol packet  Commonly known as:  MIRALAX / GLYCOLAX  Take 17 g by mouth as needed.     predniSONE 10 MG tablet  Commonly known as:  STERAPRED UNI-PAK  Take 1 tablet (10 mg total) by mouth daily. Take 6 tablets today and then decrease by 1 tablet daily until none are left.     roflumilast 500 MCG Tabs tablet  Commonly known as:  DALIRESP  Take 1 tablet (500 mcg total) by mouth daily.     simvastatin 20 MG tablet  Commonly known as:  ZOCOR  Take 1 tablet (20 mg total) by mouth at bedtime.       Allergies  Allergen Reactions  . Vancomycin     thrombocytopenia  . Cephalexin     REACTION: anaphylactic shock  . Moxifloxacin     REACTION: achilles tendon rupture  . Penicillins     REACTION: rash      The results of significant diagnostics from this hospitalization (including imaging, microbiology, ancillary and laboratory) are listed below for reference.    Significant Diagnostic Studies: Dg Chest 2 View  04/16/2013   *RADIOLOGY REPORT*  Clinical Data: Shortness of breath.  Weakness and back pain  CHEST - 2 VIEW  Comparison: 12/18/2012  Findings: Heart size is moderately enlarged.  There is no pleural effusion identified.  Atelectasis is identified in both lung bases. No airspace consolidation.  Spondylosis is noted throughout the thoracic spine.  IMPRESSION:  1.  Bibasilar atelectasis. 2.  Cardiac enlargement without heart failure.   Original Report Authenticated By: Signa Kell, M.D.   US Renal  04/17/2013   *RADIOLOGY REPORT*  Clinical Data: Acute renal failure  RENAL/URINARY TRACT ULTRASOUND COMPLETE  Comparison:  CT 12/18/2012  Findings:  Right Kidney:  10.3 cm in length.  15 x 18 x 19 mm cyst in the lower pole.  No  hydronephrosis.  No solid renal lesion.  Left Kidney:  13 cm in length. No hydronephrosis.  Well-preserved cortex.  Normal size and parenchymal echotexture without focal abnormalities.  Bladder:  Physiologically distended, unremarkable.  IMPRESSION: 1.  Negative for hydronephrosis. 2.  Stable right renal cyst.   Original Report Authenticated By: D. Andria Rhein, MD    Microbiology: No results found for this or any previous visit (from the past 240 hour(s)).   Labs: Basic Metabolic Panel:  Recent Labs Lab 04/16/13 2036 04/17/13 0230 04/18/13 0506  NA 129* 128* 135  K 4.1 4.0 4.9  CL 92* 92* 102  CO2 20 18* 23  GLUCOSE 103* 208* 154*  BUN 74* 77* 69*  CREATININE 4.02* 3.56* 1.73*  CALCIUM 8.9 8.6 8.5   Liver Function Tests:  Recent Labs Lab 04/16/13 2036  AST 18  ALT 12  ALKPHOS 119*  BILITOT 0.4  PROT 7.2  ALBUMIN 3.6   No results found for this basename: LIPASE, AMYLASE,  in the last 168 hours No results found for this basename: AMMONIA,  in the last 168 hours CBC:  Recent Labs Lab 04/16/13 2036 04/17/13 0230 04/18/13 0506  WBC 8.3 6.4 9.6  HGB 12.2* 11.4* 10.6*  HCT 36.0* 32.9* 30.4*  MCV 90.0 89.2 88.9  PLT 138* 124* 146*   Cardiac Enzymes: No results found for this basename: CKTOTAL, CKMB, CKMBINDEX, TROPONINI,  in the last 168 hours BNP: BNP (last 3 results) No results found for this basename: PROBNP,  in the last 8760 hours CBG:  Recent Labs Lab 04/17/13 0832 04/18/13 0735  GLUCAP 198* 158*       Signed:  HERNANDEZ ACOSTA,ESTELA  [facility] Pager: [phone] 04/18/2013, 2:43 PM

## 2013-04-18 NOTE — Progress Notes (Signed)
Subjective: Interval History: none.  Objective: Vital signs in last 24 hours: Temp:  [97.6 F (36.4 C)-98.3 F (36.8 C)] 97.6 F (36.4 C) (07/18 0549) Pulse Rate:  [83-95] 95 (07/18 0549) Resp:  [14-18] 14 (07/18 0549) BP: (118-144)/(60-71) 144/61 mmHg (07/18 0549) SpO2:  [91 %-94 %] 92 % (07/18 0756) Weight change:   Intake/Output from previous day: 07/17 0701 - 07/18 0700 In: 1205 [I.V.:1205] Out: 3 [Urine:3] Intake/Output this shift: Total I/O In: 320 [P.O.:320] Out: -   General appearance: alert, cooperative and no distress Resp: diminished breath sounds bilaterally and wheezes bilaterally Cardio: S1, S2 normal and systolic murmur: holosystolic 2/6, blowing at apex GI: soft, non-tender; bowel sounds normal; no masses,  no organomegaly Extremities: trophic changes  Lab Results:  Recent Labs  04/17/13 0230 04/18/13 0506  WBC 6.4 9.6  HGB 11.4* 10.6*  HCT 32.9* 30.4*  PLT 124* 146*   BMET:  Recent Labs  04/17/13 0230 04/18/13 0506  NA 128* 135  K 4.0 4.9  CL 92* 102  CO2 18* 23  GLUCOSE 208* 154*  BUN 77* 69*  CREATININE 3.56* 1.73*  CALCIUM 8.6 8.5   No results found for this basename: PTH,  in the last 72 hours Iron Studies:  Recent Labs  04/17/13 1239  IRON 21*  TIBC 244    Studies/Results: Dg Chest 2 View  04/16/2013   *RADIOLOGY REPORT*  Clinical Data: Shortness of breath.  Weakness and back pain  CHEST - 2 VIEW  Comparison: 12/18/2012  Findings: Heart size is moderately enlarged.  There is no pleural effusion identified.  Atelectasis is identified in both lung bases. No airspace consolidation.  Spondylosis is noted throughout the thoracic spine.  IMPRESSION:  1.  Bibasilar atelectasis. 2.  Cardiac enlargement without heart failure.   Original Report Authenticated By: Signa Kell, M.D.   US Renal  04/17/2013   *RADIOLOGY REPORT*  Clinical Data: Acute renal failure  RENAL/URINARY TRACT ULTRASOUND COMPLETE  Comparison:  CT 12/18/2012   Findings:  Right Kidney:  10.3 cm in length.  15 x 18 x 19 mm cyst in the lower pole.  No hydronephrosis.  No solid renal lesion.  Left Kidney:  13 cm in length. No hydronephrosis.  Well-preserved cortex.  Normal size and parenchymal echotexture without focal abnormalities.  Bladder:  Physiologically distended, unremarkable.  IMPRESSION: 1.  Negative for hydronephrosis. 2.  Stable right renal cyst.   Original Report Authenticated By: D. Andria Rhein, MD    I have reviewed the patient's current medications.  Assessment/Plan: 1 AKI low FENA, R kidney smaller than L (RAS) benign urine.  ARB toxicity.  Avoid ARB for several weeks and can restart at lower dose if needed. Avoid ARB for 3 d if acute illness. 2 Fe defic needs po fe and eval by primary 3 COPD better, avoid cig P for D/C give Fe. Avoid Losartan    LOS: 2 days   [name] L 04/18/2013,10:43 AM

## 2013-04-30 ENCOUNTER — Other Ambulatory Visit: Payer: Self-pay | Admitting: Cardiology

## 2013-04-30 ENCOUNTER — Ambulatory Visit: Payer: Self-pay | Admitting: Vascular Surgery

## 2013-04-30 ENCOUNTER — Other Ambulatory Visit: Payer: Self-pay

## 2013-05-01 ENCOUNTER — Ambulatory Visit: Payer: Medicare Other | Admitting: Critical Care Medicine

## 2013-05-03 ENCOUNTER — Other Ambulatory Visit: Payer: Self-pay | Admitting: Critical Care Medicine

## 2013-05-13 ENCOUNTER — Other Ambulatory Visit (INDEPENDENT_AMBULATORY_CARE_PROVIDER_SITE_OTHER): Payer: Medicare Other

## 2013-05-13 DIAGNOSIS — E785 Hyperlipidemia, unspecified: Secondary | ICD-10-CM

## 2013-05-13 DIAGNOSIS — I10 Essential (primary) hypertension: Secondary | ICD-10-CM

## 2013-05-13 LAB — HEPATIC FUNCTION PANEL
ALT: 13 U/L (ref 0–53)
AST: 15 U/L (ref 0–37)
Alkaline Phosphatase: 95 U/L (ref 39–117)
Bilirubin, Direct: 0.1 mg/dL (ref 0.0–0.3)
Total Bilirubin: 0.5 mg/dL (ref 0.3–1.2)

## 2013-05-13 LAB — BASIC METABOLIC PANEL
CO2: 22 mEq/L (ref 19–32)
Calcium: 9.2 mg/dL (ref 8.4–10.5)
Creatinine, Ser: 1.1 mg/dL (ref 0.4–1.5)
GFR: 70.07 mL/min (ref >60.00)
Glucose, Bld: 109 mg/dL — ABNORMAL HIGH (ref 70–99)
Sodium: 139 mEq/L (ref 135–145)

## 2013-05-13 LAB — LIPID PANEL
HDL: 35.2 mg/dL — ABNORMAL LOW (ref >39.00)
Total CHOL/HDL Ratio: 4

## 2013-05-19 ENCOUNTER — Ambulatory Visit (INDEPENDENT_AMBULATORY_CARE_PROVIDER_SITE_OTHER): Payer: Medicare Other | Admitting: Critical Care Medicine

## 2013-05-19 ENCOUNTER — Encounter: Payer: Self-pay | Admitting: Critical Care Medicine

## 2013-05-19 VITALS — BP 114/62 | HR 85 | Temp 98.1°F | Ht 67.0 in | Wt 157.0 lb

## 2013-05-19 DIAGNOSIS — J449 Chronic obstructive pulmonary disease, unspecified: Secondary | ICD-10-CM

## 2013-05-19 MED ORDER — IPRATROPIUM BROMIDE 0.02 % IN SOLN: 0.5000 mg | Freq: Three times a day (TID) | RESPIRATORY_TRACT | Status: DC

## 2013-05-19 MED ORDER — ALBUTEROL SULFATE (5 MG/ML) 0.5% IN NEBU: 2.5000 mg | INHALATION_SOLUTION | Freq: Three times a day (TID) | RESPIRATORY_TRACT | Status: DC

## 2013-05-19 MED ORDER — BUDESONIDE-FORMOTEROL FUMARATE 160-4.5 MCG/ACT IN AERO: INHALATION_SPRAY | RESPIRATORY_TRACT | Status: DC

## 2013-05-19 MED ORDER — ALBUTEROL SULFATE HFA 108 (90 BASE) MCG/ACT IN AERS: 2.0000 | INHALATION_SPRAY | Freq: Four times a day (QID) | RESPIRATORY_TRACT | Status: DC | PRN

## 2013-05-19 NOTE — Patient Instructions (Addendum)
No change in medications. Return in      3 months        Refills on all inhalers/nebulizer medications sent to [facility]
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
## 2013-05-19 NOTE — Assessment & Plan Note (Signed)
Gold stage D. COPD with frequent exacerbations status post recent hospitalization for same Ongoing tobacco use with recurrence leading to exacerbation now off cigarettes smoking Plan Maintain inhaled medications as prescribed No additional steroids indicated

## 2013-05-20 ENCOUNTER — Other Ambulatory Visit: Payer: Self-pay | Admitting: *Deleted

## 2013-05-20 ENCOUNTER — Ambulatory Visit (INDEPENDENT_AMBULATORY_CARE_PROVIDER_SITE_OTHER): Payer: Medicare Other | Admitting: Internal Medicine

## 2013-05-20 ENCOUNTER — Encounter: Payer: Self-pay | Admitting: Internal Medicine

## 2013-05-20 VITALS — BP 108/70 | HR 72 | Temp 97.8°F | Wt 158.0 lb

## 2013-05-20 DIAGNOSIS — I10 Essential (primary) hypertension: Secondary | ICD-10-CM

## 2013-05-20 DIAGNOSIS — N179 Acute kidney failure, unspecified: Secondary | ICD-10-CM

## 2013-05-20 DIAGNOSIS — I701 Atherosclerosis of renal artery: Secondary | ICD-10-CM

## 2013-05-20 DIAGNOSIS — E785 Hyperlipidemia, unspecified: Secondary | ICD-10-CM

## 2013-05-20 DIAGNOSIS — M545 Low back pain: Secondary | ICD-10-CM

## 2013-05-20 MED ORDER — MORPHINE SULFATE 15 MG PO TABS: 15.0000 mg | ORAL_TABLET | Freq: Three times a day (TID) | ORAL | Status: DC

## 2013-05-20 MED ORDER — ALBUTEROL SULFATE (5 MG/ML) 0.5% IN NEBU: 2.5000 mg | INHALATION_SOLUTION | Freq: Three times a day (TID) | RESPIRATORY_TRACT | Status: DC

## 2013-05-20 NOTE — Assessment & Plan Note (Signed)
Continue bisoprolol.  Off ARB due to episode of renal insuff.
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
## 2013-05-20 NOTE — Assessment & Plan Note (Addendum)
Resolved.  Presumed secondary to prerenal state and ARB.  Losartan discontinued.  BP stable.  Patient encouraged to decrease consumption of caffeinated beverages and increase water intake.

## 2013-05-20 NOTE — Assessment & Plan Note (Signed)
No change.  Continue same dose of OxyContin. Check UDS.

## 2013-05-20 NOTE — Assessment & Plan Note (Signed)
LDL at goal. Continue same dose of simvastatin.  Lab Results  Component Value Date   CHOL 127 05/13/2013   HDL 35.20* 05/13/2013   LDLCALC 64 05/13/2013   LDLDIRECT 54.9 05/22/2012   TRIG 139.0 05/13/2013   CHOLHDL 4 05/13/2013   Lab Results  Component Value Date   ALT 13 05/13/2013   AST 15 05/13/2013   ALKPHOS 95 05/13/2013   BILITOT 0.5 05/13/2013

## 2013-05-20 NOTE — Patient Instructions (Addendum)
Please complete the following lab tests before your next follow up appointment: BMET - 584.9

## 2013-05-20 NOTE — Telephone Encounter (Signed)
Received PA request for albuterol 0.5 neb from [facility].   [employer], spoke with Joe. Was advised they are running this through Medicare Part B, and it will be covered, but will need to have a dx code sent on new rx. I have included dx code and sent new rx to [facility].  Joe aware and voiced no further questions or concerns at this time.

## 2013-05-21 LAB — DRUG SCREEN, URINE
Amphetamine Screen, Ur: NEGATIVE
Benzodiazepines.: NEGATIVE
Cocaine Metabolites: NEGATIVE
Marijuana Metabolite: NEGATIVE
Opiates: POSITIVE — AB
Phencyclidine (PCP): NEGATIVE

## 2013-06-02 ENCOUNTER — Other Ambulatory Visit: Payer: Self-pay | Admitting: Cardiovascular Disease

## 2013-06-11 ENCOUNTER — Other Ambulatory Visit: Payer: Self-pay

## 2013-06-11 ENCOUNTER — Ambulatory Visit: Payer: Self-pay | Admitting: Neurosurgery

## 2013-06-30 ENCOUNTER — Other Ambulatory Visit: Payer: Self-pay

## 2013-06-30 MED ORDER — BISOPROLOL FUMARATE 5 MG PO TABS: 5.0000 mg | ORAL_TABLET | Freq: Every day | ORAL | Status: DC

## 2013-06-30 MED ORDER — ISOSORBIDE DINITRATE 30 MG PO TABS: ORAL_TABLET | ORAL | Status: DC

## 2013-07-01 ENCOUNTER — Other Ambulatory Visit: Payer: Self-pay | Admitting: Vascular Surgery

## 2013-07-01 ENCOUNTER — Encounter: Payer: Self-pay | Admitting: Vascular Surgery

## 2013-07-01 LAB — CREATININE, SERUM: Creat: 1.16 mg/dL (ref 0.50–1.35)

## 2013-07-02 ENCOUNTER — Ambulatory Visit
Admission: RE | Admit: 2013-07-02 | Discharge: 2013-07-02 | Disposition: A | Discharge status: Home / Self Care | Payer: Medicare Other | Source: Ambulatory Visit | Attending: Vascular Surgery | Admitting: Vascular Surgery

## 2013-07-02 ENCOUNTER — Ambulatory Visit (INDEPENDENT_AMBULATORY_CARE_PROVIDER_SITE_OTHER): Payer: Medicare Other | Admitting: Vascular Surgery

## 2013-07-02 ENCOUNTER — Encounter: Payer: Self-pay | Admitting: Vascular Surgery

## 2013-07-02 ENCOUNTER — Ambulatory Visit (HOSPITAL_COMMUNITY)
Admission: RE | Admit: 2013-07-02 | Discharge: 2013-07-02 | Disposition: A | Discharge status: Home / Self Care | Payer: Medicare Other | Source: Ambulatory Visit | Attending: Vascular Surgery | Admitting: Vascular Surgery

## 2013-07-02 DIAGNOSIS — I714 Abdominal aortic aneurysm, without rupture, unspecified: Secondary | ICD-10-CM

## 2013-07-02 DIAGNOSIS — I6529 Occlusion and stenosis of unspecified carotid artery: Secondary | ICD-10-CM

## 2013-07-02 MED ORDER — IOHEXOL 350 MG/ML SOLN
100.0000 mL | Freq: Once | INTRAVENOUS | Status: AC | PRN
  Administered 2013-07-02: 100 mL via INTRAVENOUS
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
## 2013-07-03 ENCOUNTER — Other Ambulatory Visit: Payer: Self-pay | Admitting: *Deleted

## 2013-07-03 MED ORDER — CLOPIDOGREL BISULFATE 75 MG PO TABS: ORAL_TABLET | ORAL | Status: DC

## 2013-07-03 NOTE — Addendum Note (Signed)
Addended by: Sharee Pimple on: 07/03/2013 10:10 AM   Modules accepted: Orders

## 2013-07-04 ENCOUNTER — Ambulatory Visit: Payer: Medicare Other | Admitting: Physician Assistant

## 2013-07-16 ENCOUNTER — Encounter: Payer: Self-pay | Admitting: Physician Assistant

## 2013-07-16 ENCOUNTER — Ambulatory Visit (INDEPENDENT_AMBULATORY_CARE_PROVIDER_SITE_OTHER): Payer: Medicare Other | Admitting: Physician Assistant

## 2013-07-16 VITALS — BP 122/76 | HR 76 | Ht 67.0 in | Wt 157.0 lb

## 2013-07-16 DIAGNOSIS — I6523 Occlusion and stenosis of bilateral carotid arteries: Secondary | ICD-10-CM

## 2013-07-16 DIAGNOSIS — I2510 Atherosclerotic heart disease of native coronary artery without angina pectoris: Secondary | ICD-10-CM

## 2013-07-16 DIAGNOSIS — I77810 Thoracic aortic ectasia: Secondary | ICD-10-CM

## 2013-07-16 DIAGNOSIS — E785 Hyperlipidemia, unspecified: Secondary | ICD-10-CM

## 2013-07-16 DIAGNOSIS — I714 Abdominal aortic aneurysm, without rupture: Secondary | ICD-10-CM

## 2013-07-16 DIAGNOSIS — I255 Ischemic cardiomyopathy: Secondary | ICD-10-CM

## 2013-07-16 DIAGNOSIS — I6529 Occlusion and stenosis of unspecified carotid artery: Secondary | ICD-10-CM

## 2013-07-16 DIAGNOSIS — I658 Occlusion and stenosis of other precerebral arteries: Secondary | ICD-10-CM

## 2013-07-16 DIAGNOSIS — I10 Essential (primary) hypertension: Secondary | ICD-10-CM

## 2013-07-16 DIAGNOSIS — I5022 Chronic systolic (congestive) heart failure: Secondary | ICD-10-CM

## 2013-07-16 DIAGNOSIS — I701 Atherosclerosis of renal artery: Secondary | ICD-10-CM

## 2013-07-16 DIAGNOSIS — I2589 Other forms of chronic ischemic heart disease: Secondary | ICD-10-CM

## 2013-07-16 MED ORDER — HYDRALAZINE HCL 10 MG PO TABS: 10.0000 mg | ORAL_TABLET | Freq: Three times a day (TID) | ORAL | Status: DC

## 2013-07-16 NOTE — Patient Instructions (Addendum)
NO CHANGES WERE MADE TODAY  Your physician wants you to follow-up in: 6 MONTHS WITH DR. CRENSHAW. You will receive a reminder letter in the mail two months in advance. If you don't receive a letter, please call our office to schedule the follow-up appointment.  START HYDRALAZINE 10 MG 1 TAB THREE TIMES DAILY  PLEASE SCHEDULE TO HAVE A CHEST CTA W/AND W/O DX DILATED THORACIC AORTA; LUNG NODULES
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
## 2013-07-16 NOTE — Addendum Note (Signed)
Addended by: Tarri Fuller on: 07/16/2013 06:00 PM   Modules accepted: Orders

## 2013-07-17 ENCOUNTER — Other Ambulatory Visit: Payer: Self-pay | Admitting: Internal Medicine

## 2013-07-18 ENCOUNTER — Ambulatory Visit (INDEPENDENT_AMBULATORY_CARE_PROVIDER_SITE_OTHER)
Admission: RE | Admit: 2013-07-18 | Discharge: 2013-07-18 | Disposition: A | Discharge status: Home / Self Care | Payer: Medicare Other | Source: Ambulatory Visit | Attending: Physician Assistant | Admitting: Physician Assistant

## 2013-07-18 DIAGNOSIS — I77810 Thoracic aortic ectasia: Secondary | ICD-10-CM

## 2013-07-18 MED ORDER — IOHEXOL 300 MG/ML  SOLN
100.0000 mL | Freq: Once | INTRAMUSCULAR | Status: AC | PRN
  Administered 2013-07-18: 100 mL via INTRAVENOUS

## 2013-07-21 ENCOUNTER — Other Ambulatory Visit: Payer: Self-pay | Admitting: Internal Medicine

## 2013-07-21 ENCOUNTER — Other Ambulatory Visit: Payer: Medicare Other

## 2013-07-22 ENCOUNTER — Telehealth: Payer: Self-pay | Admitting: *Deleted

## 2013-07-22 NOTE — Telephone Encounter (Signed)
pt  notified about CT results with new apical lung nodule 7.5 mm and needs f/u w/Dr. Delford Field. I will make appt for pt and cb later today; pt said ok and thank you.

## 2013-07-22 NOTE — Telephone Encounter (Signed)
pt notified about appt 07/25/13 @ 2:15 in the [facility] office; with Rubye Oaks, PA for Dr. Delford Field to discuss CT results further about new lung nodule. Pt verbalized understanding to Plan of Care

## 2013-07-25 ENCOUNTER — Encounter: Payer: Self-pay | Admitting: Adult Health

## 2013-07-25 ENCOUNTER — Ambulatory Visit (INDEPENDENT_AMBULATORY_CARE_PROVIDER_SITE_OTHER): Payer: Medicare Other | Admitting: Adult Health

## 2013-07-25 ENCOUNTER — Telehealth: Payer: Self-pay | Admitting: *Deleted

## 2013-07-25 VITALS — BP 102/62 | HR 70 | Temp 98.1°F | Ht 67.0 in | Wt 159.0 lb

## 2013-07-25 DIAGNOSIS — J449 Chronic obstructive pulmonary disease, unspecified: Secondary | ICD-10-CM

## 2013-07-25 DIAGNOSIS — Z23 Encounter for immunization: Secondary | ICD-10-CM

## 2013-07-25 DIAGNOSIS — R911 Solitary pulmonary nodule: Secondary | ICD-10-CM

## 2013-07-25 NOTE — Telephone Encounter (Signed)
Message copied by Gweneth Dimitri D on Fri Jul 25, 2013  9:13 AM ------      Message from: Shan Levans E      Created: Thu Jul 24, 2013  5:23 PM       This pt needs appt soon to discuss new pulm nodule      He comes to [facility]  ------

## 2013-07-25 NOTE — Patient Instructions (Signed)
Flu shot today  We will repeat CT chest in 4 months to follow lung nodule  follow up Dr. Delford Field  In 6 weeks and As needed

## 2013-07-25 NOTE — Assessment & Plan Note (Signed)
At baseline without flare  Cont on current regimen  Congratulated on smoking cessation

## 2013-07-25 NOTE — Assessment & Plan Note (Signed)
New LUL lung nodule asymptomatic  Hx of smoking  CT reviewed with pt  Pt to have follow up CT chest in 4 month  Please contact office for sooner follow up if symptoms do not improve or worsen or seek emergency care  follow up Dr. Delford Field  In 6 weeks as planned and As needed
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
## 2013-07-25 NOTE — Telephone Encounter (Signed)
Called, spoke with pt's wife as pt was busy.  Wife reports pt already has OV scheduled today at [facility] to see TP at 2:15 regarding CT.   I spoke with TP.  It is ok with TP for her to see this pt this evening regarding this.  If she has any questions, she will touch base with PW.   Will sign off and route to PW as FYI.

## 2013-08-13 ENCOUNTER — Other Ambulatory Visit (INDEPENDENT_AMBULATORY_CARE_PROVIDER_SITE_OTHER): Payer: Medicare Other

## 2013-08-13 DIAGNOSIS — N179 Acute kidney failure, unspecified: Secondary | ICD-10-CM

## 2013-08-13 LAB — BASIC METABOLIC PANEL
BUN: 29 mg/dL — ABNORMAL HIGH (ref 6–23)
CO2: 28 mEq/L (ref 19–32)
Calcium: 9.4 mg/dL (ref 8.4–10.5)
Chloride: 103 mEq/L (ref 96–112)
Creatinine, Ser: 1.4 mg/dL (ref 0.4–1.5)
GFR: 52.81 mL/min — ABNORMAL LOW (ref >60.00)
Glucose, Bld: 92 mg/dL (ref 70–99)
Sodium: 139 mEq/L (ref 135–145)

## 2013-08-18 ENCOUNTER — Encounter: Payer: Self-pay | Admitting: Internal Medicine

## 2013-08-18 ENCOUNTER — Ambulatory Visit (INDEPENDENT_AMBULATORY_CARE_PROVIDER_SITE_OTHER): Payer: Medicare Other | Admitting: Internal Medicine

## 2013-08-18 VITALS — BP 102/60 | HR 64 | Temp 98.0°F | Ht 67.0 in | Wt 154.0 lb

## 2013-08-18 DIAGNOSIS — N179 Acute kidney failure, unspecified: Secondary | ICD-10-CM

## 2013-08-18 DIAGNOSIS — R911 Solitary pulmonary nodule: Secondary | ICD-10-CM

## 2013-08-18 DIAGNOSIS — M545 Low back pain, unspecified: Secondary | ICD-10-CM

## 2013-08-18 DIAGNOSIS — Z23 Encounter for immunization: Secondary | ICD-10-CM

## 2013-08-18 DIAGNOSIS — I10 Essential (primary) hypertension: Secondary | ICD-10-CM

## 2013-08-18 MED ORDER — BISOPROLOL FUMARATE 5 MG PO TABS: 5.0000 mg | ORAL_TABLET | Freq: Every day | ORAL | Status: DC

## 2013-08-18 MED ORDER — MORPHINE SULFATE 15 MG PO TABS: 15.0000 mg | ORAL_TABLET | Freq: Three times a day (TID) | ORAL | Status: DC

## 2013-08-18 MED ORDER — AMITRIPTYLINE HCL 25 MG PO TABS: 25.0000 mg | ORAL_TABLET | Freq: Every day | ORAL | Status: DC

## 2013-08-18 NOTE — Assessment & Plan Note (Signed)
Continue same dose of MS IR 15 mg 3 times a day. He understands risk of use of narcotics in patients with advanced COPD.

## 2013-08-18 NOTE — Assessment & Plan Note (Signed)
Followed by pulmonary 

## 2013-08-18 NOTE — Progress Notes (Signed)
Pre visit review using our clinic review tool, if applicable. No additional management support is needed unless otherwise documented below in the visit note. 

## 2013-08-18 NOTE — Patient Instructions (Signed)
Please complete the following lab tests before your next follow up appointment: BMET - 401.9 Varicella titer - V70 or exposure node

## 2013-08-18 NOTE — Assessment & Plan Note (Signed)
Stable. Monitor electrolytes and kidney function. 
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
## 2013-08-18 NOTE — Assessment & Plan Note (Signed)
Maintain same dose of bisoprolol. He denies dizziness or lightheadedness.

## 2013-08-19 ENCOUNTER — Ambulatory Visit: Payer: Medicare Other | Admitting: Internal Medicine

## 2013-08-27 ENCOUNTER — Other Ambulatory Visit: Payer: Self-pay | Admitting: Cardiology

## 2013-09-04 ENCOUNTER — Encounter: Payer: Self-pay | Admitting: Critical Care Medicine

## 2013-09-04 ENCOUNTER — Ambulatory Visit (INDEPENDENT_AMBULATORY_CARE_PROVIDER_SITE_OTHER): Payer: Medicare Other | Admitting: Critical Care Medicine

## 2013-09-04 VITALS — BP 112/64 | HR 73 | Temp 97.8°F | Ht 67.0 in | Wt 152.8 lb

## 2013-09-04 DIAGNOSIS — R911 Solitary pulmonary nodule: Secondary | ICD-10-CM

## 2013-09-04 MED ORDER — ALBUTEROL SULFATE (5 MG/ML) 0.5% IN NEBU: 2.5000 mg | INHALATION_SOLUTION | Freq: Three times a day (TID) | RESPIRATORY_TRACT | Status: DC

## 2013-09-04 MED ORDER — IPRATROPIUM BROMIDE 0.02 % IN SOLN: 0.5000 mg | Freq: Three times a day (TID) | RESPIRATORY_TRACT | Status: DC

## 2013-09-04 NOTE — Patient Instructions (Signed)
Repeat CT Chest in January, will call with results No change in medications Return 4 months
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
## 2013-09-05 NOTE — Assessment & Plan Note (Signed)
7.5 mm new LUL lung nodule. Worrisome for CA, only way to biopsy is to resect. Plan  Repeat CT in 3 months . If changed, will need surgical referral

## 2013-09-13 ENCOUNTER — Ambulatory Visit (INDEPENDENT_AMBULATORY_CARE_PROVIDER_SITE_OTHER): Payer: Medicare Other | Admitting: Internal Medicine

## 2013-09-13 ENCOUNTER — Encounter: Payer: Self-pay | Admitting: Internal Medicine

## 2013-09-13 VITALS — BP 110/60 | HR 88 | Temp 97.1°F | Resp 28 | Wt 153.0 lb

## 2013-09-13 DIAGNOSIS — J449 Chronic obstructive pulmonary disease, unspecified: Secondary | ICD-10-CM

## 2013-09-13 MED ORDER — PREDNISONE 20 MG PO TABS: 20.0000 mg | ORAL_TABLET | Freq: Every day | ORAL | Status: AC

## 2013-09-13 MED ORDER — HYDROCODONE-HOMATROPINE 5-1.5 MG/5ML PO SYRP: 5.0000 mL | ORAL_SOLUTION | Freq: Three times a day (TID) | ORAL | Status: DC | PRN

## 2013-09-13 MED ORDER — LEVOFLOXACIN 750 MG PO TABS: 750.0000 mg | ORAL_TABLET | Freq: Every day | ORAL | Status: AC
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
## 2013-09-13 NOTE — Progress Notes (Signed)
Pre-visit discussion using our clinic review tool. No additional management support is needed unless otherwise documented below in the visit note.  

## 2013-09-13 NOTE — Assessment & Plan Note (Signed)
He is clearly having an exacerbation and should be treated aggressively. ABX: levaquin Steroids Nebulizer- increase frequency to q 4 hours and then taper frequency as he begins to feel better.

## 2013-09-15 ENCOUNTER — Other Ambulatory Visit: Payer: Self-pay | Admitting: Internal Medicine

## 2013-09-23 ENCOUNTER — Other Ambulatory Visit: Payer: Self-pay | Admitting: Internal Medicine

## 2013-09-23 NOTE — Telephone Encounter (Signed)
Pt called to fu on refill request. Sent in 12/15  ALPRAZolam (XANAX) 0.25 MG tablet [Pharmacy Med Name: ALPRAZolam 0.25MG  TAB Pt would like a call back on this request .thanks!  Pt would like to pharm change this request to  [facility]/ [address]

## 2013-10-10 ENCOUNTER — Other Ambulatory Visit (INDEPENDENT_AMBULATORY_CARE_PROVIDER_SITE_OTHER): Payer: Medicare HMO

## 2013-10-10 DIAGNOSIS — I10 Essential (primary) hypertension: Secondary | ICD-10-CM

## 2013-10-10 LAB — BASIC METABOLIC PANEL
BUN: 25 mg/dL — AB (ref 6–23)
CO2: 28 mEq/L (ref 19–32)
CREATININE: 1.4 mg/dL (ref 0.4–1.5)
Calcium: 9.6 mg/dL (ref 8.4–10.5)
Chloride: 107 mEq/L (ref 96–112)
GFR: 55.46 mL/min — ABNORMAL LOW (ref >60.00)
GLUCOSE: 121 mg/dL — AB (ref 70–99)
POTASSIUM: 5 meq/L (ref 3.5–5.1)
Sodium: 142 mEq/L (ref 135–145)

## 2013-10-11 LAB — VARICELLA ZOSTER ANTIBODY, IGG: Varicella IgG: <10 Index (ref <135.00)

## 2013-10-14 LAB — VARICELLA ZOSTER ANTIBODY, IGM: Varicella Zoster Ab IgM: 0 {ISR} (ref <0.91)

## 2013-10-15 ENCOUNTER — Telehealth: Payer: Self-pay | Admitting: Internal Medicine

## 2013-10-15 DIAGNOSIS — R911 Solitary pulmonary nodule: Secondary | ICD-10-CM

## 2013-10-15 NOTE — Telephone Encounter (Signed)
Referral order placed.

## 2013-10-15 NOTE — Telephone Encounter (Signed)
Please create pulmonary referral for lung nodule. [employer].

## 2013-10-17 ENCOUNTER — Ambulatory Visit (INDEPENDENT_AMBULATORY_CARE_PROVIDER_SITE_OTHER): Payer: Medicare HMO | Admitting: Internal Medicine

## 2013-10-17 ENCOUNTER — Encounter: Payer: Self-pay | Admitting: Internal Medicine

## 2013-10-17 VITALS — BP 110/70 | HR 76 | Temp 98.4°F | Ht 67.0 in | Wt 152.0 lb

## 2013-10-17 DIAGNOSIS — M545 Low back pain, unspecified: Secondary | ICD-10-CM

## 2013-10-17 DIAGNOSIS — R911 Solitary pulmonary nodule: Secondary | ICD-10-CM

## 2013-10-17 DIAGNOSIS — I10 Essential (primary) hypertension: Secondary | ICD-10-CM

## 2013-10-17 MED ORDER — MORPHINE SULFATE 15 MG PO TABS: 15.0000 mg | ORAL_TABLET | Freq: Three times a day (TID) | ORAL | Status: DC

## 2013-10-17 MED ORDER — BISOPROLOL FUMARATE 5 MG PO TABS: 5.0000 mg | ORAL_TABLET | Freq: Every day | ORAL | Status: DC

## 2013-10-17 MED ORDER — ALPRAZOLAM 0.25 MG PO TABS: 0.2500 mg | ORAL_TABLET | Freq: Three times a day (TID) | ORAL | Status: DC | PRN

## 2013-10-17 NOTE — Assessment & Plan Note (Signed)
Management as per pulmonary.  Surveillance CT of chest planned.

## 2013-10-17 NOTE — Progress Notes (Signed)
Pre visit review using our clinic review tool, if applicable. No additional management support is needed unless otherwise documented below in the visit note. 

## 2013-10-17 NOTE — Assessment & Plan Note (Signed)
Stable. Continue bisoprolol.
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
## 2013-10-17 NOTE — Assessment & Plan Note (Signed)
Unchanged.  Continue same dose of MSIR 50 mg tid.  Monitor periodic urine drug screen.

## 2013-10-18 ENCOUNTER — Telehealth: Payer: Self-pay | Admitting: Internal Medicine

## 2013-10-18 NOTE — Telephone Encounter (Signed)
Relevant patient education assigned to patient using Emmi. ° °

## 2013-10-22 ENCOUNTER — Ambulatory Visit (INDEPENDENT_AMBULATORY_CARE_PROVIDER_SITE_OTHER)
Admission: RE | Admit: 2013-10-22 | Discharge: 2013-10-22 | Disposition: A | Discharge status: Home / Self Care | Payer: Medicare HMO | Source: Ambulatory Visit | Attending: Critical Care Medicine | Admitting: Critical Care Medicine

## 2013-10-22 DIAGNOSIS — R911 Solitary pulmonary nodule: Secondary | ICD-10-CM

## 2013-10-23 ENCOUNTER — Encounter: Payer: Self-pay | Admitting: Family

## 2013-10-23 ENCOUNTER — Encounter: Payer: Self-pay | Admitting: Critical Care Medicine

## 2013-10-23 ENCOUNTER — Ambulatory Visit (INDEPENDENT_AMBULATORY_CARE_PROVIDER_SITE_OTHER): Payer: Medicare HMO | Admitting: Family

## 2013-10-23 VITALS — BP 116/76 | HR 67 | Wt 153.0 lb

## 2013-10-23 DIAGNOSIS — M25569 Pain in unspecified knee: Secondary | ICD-10-CM

## 2013-10-23 DIAGNOSIS — M25561 Pain in right knee: Secondary | ICD-10-CM

## 2013-10-23 DIAGNOSIS — M109 Gout, unspecified: Secondary | ICD-10-CM

## 2013-10-23 LAB — URIC ACID: Uric Acid, Serum: 7.1 mg/dL (ref 4.0–7.8)

## 2013-10-23 MED ORDER — INDOMETHACIN 50 MG PO CAPS: 50.0000 mg | ORAL_CAPSULE | Freq: Three times a day (TID) | ORAL | Status: DC

## 2013-10-23 MED ORDER — METHYLPREDNISOLONE ACETATE 40 MG/ML IJ SUSP
80.0000 mg | Freq: Once | INTRAMUSCULAR | Status: AC
  Administered 2013-10-23: 80 mg via INTRAMUSCULAR
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
## 2013-10-23 NOTE — Patient Instructions (Signed)

## 2013-10-27 ENCOUNTER — Telehealth: Payer: Self-pay | Admitting: Vascular Surgery

## 2013-10-27 NOTE — Telephone Encounter (Addendum)
Message copied by Gena Fray on Mon Oct 27, 2013  3:29 PM ------      Message from: Denman George      Created: Mon Oct 27, 2013  9:55 AM      Regarding: FW: follow up  visit       Olen Pel, I sent this to [facility] to look at an earlier appt.            ----- Message -----         From: Angelia Mould, MD         Sent: 10/27/2013   8:36 AM           To: Mena Goes, CMA, #      Subject: follow up  visit                                         Thanks Fraser Din,      I will try to get them in sooner.      Gerald Stabs             ----- Message -----         From: Elsie Stain, MD         Sent: 10/23/2013   9:51 AM           To: Angelia Mould, MD            Rudean Haskell see this pt, next appt April            AAA increased from 5.2 11/2012 to 5.7            fyi            Pt aware             pat             ------   10/27/13: lm with pts wife notifying of appt on 11/05/13 @ 12:30, dpm

## 2013-10-29 ENCOUNTER — Telehealth: Payer: Self-pay | Admitting: Internal Medicine

## 2013-10-29 DIAGNOSIS — I714 Abdominal aortic aneurysm, without rupture, unspecified: Secondary | ICD-10-CM

## 2013-10-29 NOTE — Telephone Encounter (Signed)
Pt needs a referral to see [name] fu on abd aneurysm [phone]. Pt has appt on 11-05-13

## 2013-10-29 NOTE — Telephone Encounter (Signed)
Please place order for pt to be seen at [facility]..Marland Kitchen

## 2013-10-30 NOTE — Telephone Encounter (Signed)
Referral order placed.

## 2013-11-04 ENCOUNTER — Other Ambulatory Visit: Payer: Self-pay | Admitting: *Deleted

## 2013-11-04 ENCOUNTER — Encounter: Payer: Self-pay | Admitting: Vascular Surgery

## 2013-11-04 MED ORDER — ROFLUMILAST 500 MCG PO TABS: 500.0000 ug | ORAL_TABLET | Freq: Every day | ORAL | Status: DC

## 2013-11-05 ENCOUNTER — Other Ambulatory Visit: Payer: Self-pay | Admitting: Vascular Surgery

## 2013-11-05 ENCOUNTER — Encounter: Payer: Self-pay | Admitting: Internal Medicine

## 2013-11-05 ENCOUNTER — Ambulatory Visit (INDEPENDENT_AMBULATORY_CARE_PROVIDER_SITE_OTHER): Payer: Commercial Managed Care - HMO | Admitting: Vascular Surgery

## 2013-11-05 ENCOUNTER — Encounter: Payer: Self-pay | Admitting: Vascular Surgery

## 2013-11-05 ENCOUNTER — Ambulatory Visit (HOSPITAL_COMMUNITY)
Admission: RE | Admit: 2013-11-05 | Discharge: 2013-11-05 | Disposition: A | Discharge status: Home / Self Care | Payer: Medicare HMO | Source: Ambulatory Visit | Attending: Vascular Surgery | Admitting: Vascular Surgery

## 2013-11-05 VITALS — BP 131/64 | HR 63 | Ht 67.0 in | Wt 152.0 lb

## 2013-11-05 DIAGNOSIS — I714 Abdominal aortic aneurysm, without rupture, unspecified: Secondary | ICD-10-CM
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
## 2013-11-06 ENCOUNTER — Other Ambulatory Visit: Payer: Self-pay | Admitting: *Deleted

## 2013-11-06 MED ORDER — ROFLUMILAST 500 MCG PO TABS: 500.0000 ug | ORAL_TABLET | Freq: Every day | ORAL | Status: DC

## 2013-11-11 ENCOUNTER — Telehealth: Payer: Self-pay | Admitting: Vascular Surgery

## 2013-11-11 NOTE — Telephone Encounter (Signed)
Told patient that Dr. Eddie Dibbles office will be contacting him regarding upcoming appointment at [facility] for Type IV Thoracoabdominal Aneurysm Consult and treat - appointment was set for 11/27/13 at 9:45 am. Dr. Eddie Dibbles office notified patient

## 2013-12-15 ENCOUNTER — Ambulatory Visit (INDEPENDENT_AMBULATORY_CARE_PROVIDER_SITE_OTHER): Payer: Medicare HMO | Admitting: Internal Medicine

## 2013-12-15 ENCOUNTER — Encounter: Payer: Self-pay | Admitting: Internal Medicine

## 2013-12-15 VITALS — BP 110/70 | HR 68 | Temp 98.0°F | Ht 67.0 in | Wt 153.0 lb

## 2013-12-15 DIAGNOSIS — I714 Abdominal aortic aneurysm, without rupture, unspecified: Secondary | ICD-10-CM

## 2013-12-15 DIAGNOSIS — I10 Essential (primary) hypertension: Secondary | ICD-10-CM

## 2013-12-15 DIAGNOSIS — J449 Chronic obstructive pulmonary disease, unspecified: Secondary | ICD-10-CM

## 2013-12-15 DIAGNOSIS — M545 Low back pain, unspecified: Secondary | ICD-10-CM

## 2013-12-15 DIAGNOSIS — Z0000 Encounter for general adult medical examination without abnormal findings: Secondary | ICD-10-CM | POA: Insufficient documentation

## 2013-12-15 MED ORDER — ISOSORBIDE DINITRATE 30 MG PO TABS: 15.0000 mg | ORAL_TABLET | Freq: Every day | ORAL | Status: DC

## 2013-12-15 MED ORDER — MORPHINE SULFATE 15 MG PO TABS: 15.0000 mg | ORAL_TABLET | Freq: Three times a day (TID) | ORAL | Status: DC

## 2013-12-15 MED ORDER — ALPRAZOLAM 0.25 MG PO TABS: 0.2500 mg | ORAL_TABLET | Freq: Three times a day (TID) | ORAL | Status: DC | PRN

## 2013-12-15 MED ORDER — MORPHINE SULFATE 15 MG PO TABS
15.0000 mg | ORAL_TABLET | Freq: Three times a day (TID) | ORAL | Status: DC
Start: 2013-12-15 — End: 2014-01-21

## 2013-12-15 NOTE — Assessment & Plan Note (Signed)
Stable.  No change in medication.  Monitor electrolytes and kidney function. BP: 110/70 mmHg  Lab Results  Component Value Date   CREATININE 1.4 10/10/2013

## 2013-12-15 NOTE — Assessment & Plan Note (Signed)
Complete smoking cessation encouraged.

## 2013-12-15 NOTE — Assessment & Plan Note (Signed)
Patient has never had screening colonoscopy.  Obtain yearly iFOB.  Patient is higher risk for colonoscopy.  If iFOB is positive, he will likely require inpatient colonoscopy.
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
## 2013-12-15 NOTE — Assessment & Plan Note (Signed)
Unchanged.  Continue same of oxycodone.  Patient not candidate for lumbar surgery.

## 2013-12-15 NOTE — Progress Notes (Signed)
Pre visit review using our clinic review tool, if applicable. No additional management support is needed unless otherwise documented below in the visit note. 

## 2013-12-15 NOTE — Assessment & Plan Note (Signed)
Followed by Dr. Scot Dock and Dr. Sammuel Hines at [facility].  Patient currently undergoing preoperative testing.

## 2013-12-15 NOTE — Patient Instructions (Signed)
Please complete the following lab tests before your next follow up appointment: BMET - 401.9 

## 2013-12-16 ENCOUNTER — Telehealth: Payer: Self-pay | Admitting: Internal Medicine

## 2013-12-16 NOTE — Telephone Encounter (Signed)
Relevant patient education assigned to patient using Emmi. ° °

## 2013-12-29 ENCOUNTER — Telehealth: Payer: Self-pay | Admitting: Internal Medicine

## 2013-12-29 DIAGNOSIS — J449 Chronic obstructive pulmonary disease, unspecified: Secondary | ICD-10-CM

## 2013-12-29 NOTE — Telephone Encounter (Signed)
Pt has [employer] and need a referral to see Dr. Asencion Noble, Pulmonary

## 2013-12-30 NOTE — Telephone Encounter (Signed)
Referral order placed.

## 2013-12-31 ENCOUNTER — Other Ambulatory Visit (HOSPITAL_COMMUNITY): Payer: Medicare Other

## 2013-12-31 ENCOUNTER — Ambulatory Visit: Payer: Medicare Other | Admitting: Vascular Surgery

## 2014-01-07 ENCOUNTER — Ambulatory Visit: Payer: Medicare HMO | Admitting: Critical Care Medicine

## 2014-01-08 ENCOUNTER — Encounter: Payer: Self-pay | Admitting: Adult Health

## 2014-01-08 ENCOUNTER — Ambulatory Visit (INDEPENDENT_AMBULATORY_CARE_PROVIDER_SITE_OTHER): Payer: Commercial Managed Care - HMO | Admitting: Adult Health

## 2014-01-08 VITALS — BP 114/68 | HR 60 | Temp 98.1°F | Ht 67.0 in | Wt 155.0 lb

## 2014-01-08 DIAGNOSIS — J4489 Other specified chronic obstructive pulmonary disease: Secondary | ICD-10-CM

## 2014-01-08 DIAGNOSIS — J449 Chronic obstructive pulmonary disease, unspecified: Secondary | ICD-10-CM

## 2014-01-08 MED ORDER — HYDROCODONE-HOMATROPINE 5-1.5 MG/5ML PO SYRP: 5.0000 mL | ORAL_SOLUTION | Freq: Four times a day (QID) | ORAL | Status: DC | PRN

## 2014-01-08 MED ORDER — PREDNISONE 10 MG PO TABS: ORAL_TABLET | ORAL | Status: DC

## 2014-01-08 MED ORDER — DOXYCYCLINE HYCLATE 100 MG PO TABS: 100.0000 mg | ORAL_TABLET | Freq: Two times a day (BID) | ORAL | Status: DC

## 2014-01-08 NOTE — Patient Instructions (Signed)
Doxycycline 100mg  Twice daily  7  Days  Mucinex DM Twice daily  As needed  Cough/congestion  Prednisone taper over next week.  Fluids and rest  Hydromet 1 tsp every 6hr As needed  Cough , may make you sleepy.  Please contact office for sooner follow up if symptoms do not improve or worsen or seek emergency care  follow up with Dr. Joya Gaskins  In 6 weeks and As needed
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
## 2014-01-09 NOTE — Assessment & Plan Note (Signed)
Exacerbation   Plan  Doxycycline 100mg  Twice daily  7  Days  Mucinex DM Twice daily  As needed  Cough/congestion  Prednisone taper over next week.  Fluids and rest  Hydromet 1 tsp every 6hr As needed  Cough , may make you sleepy.  Please contact office for sooner follow up if symptoms do not improve or worsen or seek emergency care  follow up with Dr. Joya Gaskins  In 6 weeks and As needed

## 2014-01-15 DIAGNOSIS — I716 Thoracoabdominal aortic aneurysm, without rupture, unspecified: Secondary | ICD-10-CM | POA: Insufficient documentation

## 2014-01-15 DIAGNOSIS — I6529 Occlusion and stenosis of unspecified carotid artery: Secondary | ICD-10-CM | POA: Insufficient documentation

## 2014-01-19 ENCOUNTER — Telehealth: Payer: Self-pay | Admitting: Critical Care Medicine

## 2014-01-19 ENCOUNTER — Telehealth: Payer: Self-pay | Admitting: Internal Medicine

## 2014-01-19 DIAGNOSIS — I2510 Atherosclerotic heart disease of native coronary artery without angina pectoris: Secondary | ICD-10-CM

## 2014-01-19 MED ORDER — HYDROCODONE-HOMATROPINE 5-1.5 MG/5ML PO SYRP: 5.0000 mL | ORAL_SOLUTION | Freq: Four times a day (QID) | ORAL | Status: DC | PRN

## 2014-01-19 NOTE — Telephone Encounter (Signed)
Ok x one refill  needs ov before more

## 2014-01-19 NOTE — Telephone Encounter (Signed)
Pt needs referral to dr Stanford Breed. Pt has appt may 4 and has been seeing this md for 2+yrs

## 2014-01-19 NOTE — Telephone Encounter (Signed)
Spoke with pt. rx printed for WM to sign. Pt will pick up later. Nothing further needed

## 2014-01-19 NOTE — Telephone Encounter (Signed)
Referral order placed.

## 2014-01-19 NOTE — Telephone Encounter (Signed)
Called and spoke with pt and he is requesting a refill of his hydromet.  PW is not in the office today.  Pt will need this refill by tomorrow. Last refilled on 01/08/2014.    MW please advise if you are ok to refill this for the pt.  Thanks  Allergies  Allergen Reactions  . Vancomycin     thrombocytopenia  . Cephalexin     REACTION: anaphylactic shock  . Moxifloxacin     REACTION: achilles tendon rupture  . Penicillins     REACTION: rash     Current Outpatient Prescriptions on File Prior to Visit  Medication Sig Dispense Refill  . albuterol (PROVENTIL HFA;VENTOLIN HFA) 108 (90 BASE) MCG/ACT inhaler Inhale 2 puffs into the lungs every 6 (six) hours as needed.  1 Inhaler  5  . albuterol (PROVENTIL) (5 MG/ML) 0.5% nebulizer solution Take 0.5 mLs (2.5 mg total) by nebulization 3 (three) times daily. Dx: 496  270 mL  6  . ALPRAZolam (XANAX) 0.25 MG tablet Take 1 tablet (0.25 mg total) by mouth 3 (three) times daily as needed for anxiety.  90 tablet  3  . amitriptyline (ELAVIL) 25 MG tablet Take 1 tablet (25 mg total) by mouth at bedtime.  90 tablet  1  . aspirin 325 MG tablet Take 325 mg by mouth daily.       Marland Kitchen b complex vitamins tablet Take 1 tablet by mouth daily.       . bisoprolol (ZEBETA) 5 MG tablet Take 1 tablet (5 mg total) by mouth daily.  90 tablet  1  . budesonide-formoterol (SYMBICORT) 160-4.5 MCG/ACT inhaler Take 2 puffs first thing in am and then another 2 puffs about 12 hours later.  1 Inhaler  11  . clopidogrel (PLAVIX) 75 MG tablet TAKE ONE TABLET BY MOUTH ONCE DAILY WITH BREAKFAST  30 tablet  6  . doxycycline (VIBRA-TABS) 100 MG tablet Take 1 tablet (100 mg total) by mouth 2 (two) times daily.  14 tablet  0  . Ferrous Sulfate (IRON) 325 (65 FE) MG TABS Take 1 tablet by mouth daily.      . hydrALAZINE (APRESOLINE) 10 MG tablet Take 1 tablet (10 mg total) by mouth 3 (three) times daily.  90 tablet  11  . HYDROcodone-homatropine (HYDROMET) 5-1.5 MG/5ML syrup Take 5 mLs by mouth  every 6 (six) hours as needed for cough.  240 mL  0  . ipratropium (ATROVENT) 0.02 % nebulizer solution Take 2.5 mLs (0.5 mg total) by nebulization 3 (three) times daily. Dx: 496  270 mL  6  . isosorbide dinitrate (ISORDIL) 30 MG tablet Take 0.5 tablets (15 mg total) by mouth daily.  45 tablet  1  . KRILL OIL 1000 MG CAPS Take 1 capsule by mouth daily.      Marland Kitchen morphine (MSIR) 15 MG tablet Take 1 tablet (15 mg total) by mouth every 8 (eight) hours.  90 tablet  0  . multivitamin (THERAGRAN) per tablet Take 1 tablet by mouth daily.       . polyethylene glycol (MIRALAX / GLYCOLAX) packet Take 17 g by mouth as needed.       . predniSONE (DELTASONE) 10 MG tablet 4 tabs for 4 days, then 3 tabs for 4 days, 2 tabs for 4 days, then 1 tab for 4 days, then stop  40 tablet  0  . roflumilast (DALIRESP) 500 MCG TABS tablet Take 1 tablet (500 mcg total) by mouth daily.  30 tablet  5  . simvastatin (ZOCOR) 20 MG tablet Take 1 tablet (20 mg total) by mouth at bedtime.  90 tablet  3   No current facility-administered medications on file prior to visit.

## 2014-01-21 ENCOUNTER — Telehealth: Payer: Self-pay | Admitting: Internal Medicine

## 2014-01-21 MED ORDER — MORPHINE SULFATE 15 MG PO TABS: 15.0000 mg | ORAL_TABLET | Freq: Three times a day (TID) | ORAL | Status: DC

## 2014-01-21 NOTE — Telephone Encounter (Signed)
rx ready for p/u tomorrow 01/22/14, pt aware

## 2014-01-21 NOTE — Telephone Encounter (Signed)
Pt is needing new rx for morphine (MSIR) 15 MG tablet, please call when available for pick up. Pt has appt on 02/18/14, however will be out of meds before then.

## 2014-01-21 NOTE — Telephone Encounter (Signed)
Ok for RF x 1 

## 2014-02-02 ENCOUNTER — Ambulatory Visit: Payer: Medicare HMO | Admitting: Physician Assistant

## 2014-02-06 ENCOUNTER — Encounter: Payer: Self-pay | Admitting: Critical Care Medicine

## 2014-02-06 ENCOUNTER — Ambulatory Visit (INDEPENDENT_AMBULATORY_CARE_PROVIDER_SITE_OTHER): Payer: Commercial Managed Care - HMO | Admitting: Critical Care Medicine

## 2014-02-06 VITALS — BP 96/60 | HR 67 | Temp 98.8°F | Ht 67.0 in | Wt 155.2 lb

## 2014-02-06 DIAGNOSIS — J449 Chronic obstructive pulmonary disease, unspecified: Secondary | ICD-10-CM

## 2014-02-06 MED ORDER — FLUTICASONE PROPIONATE 50 MCG/ACT NA SUSP: 2.0000 | Freq: Every day | NASAL | Status: DC

## 2014-02-06 MED ORDER — AZITHROMYCIN 250 MG PO TABS: ORAL_TABLET | ORAL | Status: DC

## 2014-02-06 MED ORDER — PREDNISONE 10 MG PO TABS: ORAL_TABLET | ORAL | Status: DC

## 2014-02-06 MED ORDER — HYDROCODONE-HOMATROPINE 5-1.5 MG/5ML PO SYRP: 5.0000 mL | ORAL_SOLUTION | Freq: Four times a day (QID) | ORAL | Status: DC | PRN

## 2014-02-06 NOTE — Patient Instructions (Signed)
Take prednisone 10mg  Take 4 tablets daily for 5 days then stop Take azithromycin 250mg  Take two once then one daily until gone Start fluticasone nasal two puff ea nostril daily No other changes Return 2 months

## 2014-02-06 NOTE — Progress Notes (Signed)
Subjective:    Patient ID: [name], male    DOB: 1950/12/05, 63 y.o.   MRN: [id number]  HPI  63 y.o.  with known history of with known history of COPD , current smoker . Golds Stage III  FeV1 39%   02/06/2014 Chief Complaint  Patient presents with  . 6 wk follow up    Breathing has worsened over the past wk - prod cough with clear to light gray mucus, increased SOB at times, wheezing, chest tightness, and feels there is fluid in right ear.  No fever.  Notes more dyspnea and cough. R ear stopped up.  Notes more dyspnea.  Notes more wheezing and chest tightness Pt is on Daliresp  Review of Systems  Constitutional:   No  weight loss, night sweats,  Fevers, chills,  +fatigue, or  lassitude.  HEENT:   No headaches,  Difficulty swallowing,  Tooth/dental problems, or  Sore throat,                No sneezing, itching, ear ache,  +nasal congestion, post nasal drip,   CV:  No chest pain,  Orthopnea, PND, swelling in lower extremities, anasarca, dizziness, palpitations, syncope.   GI  No heartburn, indigestion, abdominal pain, nausea, vomiting, diarrhea, change in bowel habits, loss of appetite, bloody stools.   Resp: Notes shortness of breath with exertion but not  at rest.   No chest wall deformity  Skin: no rash or lesions.  GU: no dysuria, change in color of urine, no urgency or frequency.  No flank pain, no hematuria   MS:  No joint pain or swelling.  No decreased range of motion.  No back pain.  Psych:  No change in mood or affect. No depression or anxiety.  No memory loss.     Objective:   Physical Exam  Filed Vitals:   02/06/14 1201  BP: 96/60  Pulse: 67  Temp: 98.8 F (37.1 C)  TempSrc: Oral  Height: 5\' 7"  (1.702 m)  Weight: 155 lb 3.2 oz (70.398 kg)  SpO2: 97%    Gen: Pleasant,  in no distress,  normal affect  ENT: No lesions,  mouth clear,  oropharynx clear, no postnasal drip Edentulous with dentures in place  Neck: No JVD, no TMG, no carotid  bruits  Lungs: tr exp wheeze  ,distant breath sounds  Cardiovascular: RRR, heart sounds normal, no murmur or gallops, no peripheral edema  Abdomen: soft and NT, no HSM,  BS normal  Musculoskeletal: No deformities, no cyanosis or clubbing  Neuro: alert, non focal              Skin: Warm, no lesions or rashes      Assessment & Plan:   Gold D. COPD with frequent exacerbations Copd exac  Take prednisone 10mg  Take 4 tablets daily for 5 days then stop Take azithromycin 250mg  Take two once then one daily until gone Start fluticasone nasal two puff ea nostril daily No other changes Return 2 months    Updated Medication List Outpatient Encounter Prescriptions as of 02/06/2014  Medication Sig  . albuterol (PROVENTIL HFA;VENTOLIN HFA) 108 (90 BASE) MCG/ACT inhaler Inhale 2 puffs into the lungs every 6 (six) hours as needed.  Marland Kitchen albuterol (PROVENTIL) (5 MG/ML) 0.5% nebulizer solution Take 0.5 mLs (2.5 mg total) by nebulization 3 (three) times daily. Dx: 496  . ALPRAZolam (XANAX) 0.25 MG tablet Take 1 tablet (0.25 mg total) by mouth 3 (three) times daily as needed for anxiety.  Marland Kitchen  amitriptyline (ELAVIL) 25 MG tablet Take 1 tablet (25 mg total) by mouth at bedtime.  Marland Kitchen aspirin 325 MG tablet Take 325 mg by mouth daily.   Marland Kitchen b complex vitamins tablet Take 1 tablet by mouth daily.   . bisoprolol (ZEBETA) 5 MG tablet Take 1 tablet (5 mg total) by mouth daily.  . budesonide-formoterol (SYMBICORT) 160-4.5 MCG/ACT inhaler Take 2 puffs first thing in am and then another 2 puffs about 12 hours later.  . clopidogrel (PLAVIX) 75 MG tablet TAKE ONE TABLET BY MOUTH ONCE DAILY WITH BREAKFAST  . Ferrous Sulfate (IRON) 325 (65 FE) MG TABS Take 1 tablet by mouth daily.  . hydrALAZINE (APRESOLINE) 10 MG tablet Take 1 tablet (10 mg total) by mouth 3 (three) times daily.  Marland Kitchen ipratropium (ATROVENT) 0.02 % nebulizer solution Take 2.5 mLs (0.5 mg total) by nebulization 3 (three) times daily. Dx: 496  . isosorbide  dinitrate (ISORDIL) 30 MG tablet Take 0.5 tablets (15 mg total) by mouth daily.  Marland Kitchen KRILL OIL 1000 MG CAPS Take 1 capsule by mouth daily.  Marland Kitchen morphine (MSIR) 15 MG tablet Take 1 tablet (15 mg total) by mouth every 8 (eight) hours.  . multivitamin (THERAGRAN) per tablet Take 1 tablet by mouth daily.   . polyethylene glycol (MIRALAX / GLYCOLAX) packet Take 17 g by mouth as needed.   . roflumilast (DALIRESP) 500 MCG TABS tablet Take 1 tablet (500 mcg total) by mouth daily.  . simvastatin (ZOCOR) 20 MG tablet Take 1 tablet (20 mg total) by mouth at bedtime.  Marland Kitchen azithromycin (ZITHROMAX) 250 MG tablet Take two once then one daily until gone  . fluticasone (FLONASE) 50 MCG/ACT nasal spray Place 2 sprays into both nostrils daily.  Marland Kitchen HYDROcodone-homatropine (HYDROMET) 5-1.5 MG/5ML syrup Take 5 mLs by mouth every 6 (six) hours as needed for cough.  . predniSONE (DELTASONE) 10 MG tablet Take 4 tablets daily for 5 days then stop  . [DISCONTINUED] doxycycline (VIBRA-TABS) 100 MG tablet Take 1 tablet (100 mg total) by mouth 2 (two) times daily.  . [DISCONTINUED] HYDROcodone-homatropine (HYDROMET) 5-1.5 MG/5ML syrup Take 5 mLs by mouth every 6 (six) hours as needed for cough.  . [DISCONTINUED] predniSONE (DELTASONE) 10 MG tablet 4 tabs for 4 days, then 3 tabs for 4 days, 2 tabs for 4 days, then 1 tab for 4 days, then stop

## 2014-02-07 NOTE — Assessment & Plan Note (Signed)
Copd exac  Take prednisone 10mg  Take 4 tablets daily for 5 days then stop Take azithromycin 250mg  Take two once then one daily until gone Start fluticasone nasal two puff ea nostril daily No other changes Return 2 months

## 2014-02-11 ENCOUNTER — Telehealth: Payer: Self-pay | Admitting: Internal Medicine

## 2014-02-11 MED ORDER — AMITRIPTYLINE HCL 25 MG PO TABS: 25.0000 mg | ORAL_TABLET | Freq: Every day | ORAL | Status: DC

## 2014-02-11 NOTE — Telephone Encounter (Signed)
rx sent in electronically 

## 2014-02-11 NOTE — Telephone Encounter (Signed)
[facility] [place] is requesting 90 day re-fill amitriptyline (ELAVIL) 25 MG tablet

## 2014-02-13 ENCOUNTER — Ambulatory Visit: Payer: Medicare HMO | Admitting: Internal Medicine

## 2014-02-18 ENCOUNTER — Encounter: Payer: Self-pay | Admitting: Internal Medicine

## 2014-02-18 ENCOUNTER — Ambulatory Visit (INDEPENDENT_AMBULATORY_CARE_PROVIDER_SITE_OTHER): Payer: Commercial Managed Care - HMO | Admitting: Internal Medicine

## 2014-02-18 VITALS — BP 100/66 | HR 72 | Temp 98.3°F | Ht 67.0 in | Wt 155.0 lb

## 2014-02-18 DIAGNOSIS — J449 Chronic obstructive pulmonary disease, unspecified: Secondary | ICD-10-CM

## 2014-02-18 DIAGNOSIS — M545 Low back pain, unspecified: Secondary | ICD-10-CM

## 2014-02-18 DIAGNOSIS — I10 Essential (primary) hypertension: Secondary | ICD-10-CM

## 2014-02-18 DIAGNOSIS — I714 Abdominal aortic aneurysm, without rupture, unspecified: Secondary | ICD-10-CM

## 2014-02-18 MED ORDER — PANTOPRAZOLE SODIUM 40 MG PO TBEC: 40.0000 mg | DELAYED_RELEASE_TABLET | Freq: Every day | ORAL | Status: DC

## 2014-02-18 MED ORDER — MORPHINE SULFATE 15 MG PO TABS: 15.0000 mg | ORAL_TABLET | Freq: Three times a day (TID) | ORAL | Status: DC

## 2014-02-18 NOTE — Progress Notes (Signed)
Pre visit review using our clinic review tool, if applicable. No additional management support is needed unless otherwise documented below in the visit note. 

## 2014-02-18 NOTE — Assessment & Plan Note (Signed)
Stable.  No change in medication. BP: 100/66 mmHg
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
## 2014-02-18 NOTE — Assessment & Plan Note (Addendum)
Patient recently seen by pulmonologist. He finished course of azithromycin and prednisone taper. He still has intermittent expiratory wheeze. Suspect component of wheeze coming from upper airway. Nocturnal reflux may be cause of upper airway irritation. Add generic protonix 40 mg before evening meal.

## 2014-02-18 NOTE — Assessment & Plan Note (Signed)
Patient reports he was seen by vascular specialist at [facility]. Plan is to repeat imaging study in 6 months. He is a candidate for fenestrated graft.

## 2014-02-18 NOTE — Assessment & Plan Note (Addendum)
Mild worsening.  Continue same dose of oxycodone.  2 prescriptions for oxycodone provided.

## 2014-02-20 ENCOUNTER — Ambulatory Visit (INDEPENDENT_AMBULATORY_CARE_PROVIDER_SITE_OTHER): Payer: Commercial Managed Care - HMO | Admitting: Cardiology

## 2014-02-20 ENCOUNTER — Ambulatory Visit: Payer: Medicare HMO | Admitting: Critical Care Medicine

## 2014-02-20 ENCOUNTER — Encounter: Payer: Self-pay | Admitting: Cardiology

## 2014-02-20 ENCOUNTER — Encounter: Payer: Self-pay | Admitting: *Deleted

## 2014-02-20 VITALS — BP 108/65 | HR 64 | Ht 67.0 in | Wt 152.8 lb

## 2014-02-20 DIAGNOSIS — I712 Thoracic aortic aneurysm, without rupture, unspecified: Secondary | ICD-10-CM

## 2014-02-20 DIAGNOSIS — I255 Ischemic cardiomyopathy: Secondary | ICD-10-CM

## 2014-02-20 DIAGNOSIS — I701 Atherosclerosis of renal artery: Secondary | ICD-10-CM

## 2014-02-20 DIAGNOSIS — I2589 Other forms of chronic ischemic heart disease: Secondary | ICD-10-CM

## 2014-02-20 DIAGNOSIS — E785 Hyperlipidemia, unspecified: Secondary | ICD-10-CM

## 2014-02-20 DIAGNOSIS — I2510 Atherosclerotic heart disease of native coronary artery without angina pectoris: Secondary | ICD-10-CM

## 2014-02-20 DIAGNOSIS — I6529 Occlusion and stenosis of unspecified carotid artery: Secondary | ICD-10-CM

## 2014-02-20 DIAGNOSIS — I10 Essential (primary) hypertension: Secondary | ICD-10-CM

## 2014-02-20 DIAGNOSIS — IMO0001 Reserved for inherently not codable concepts without codable children: Secondary | ICD-10-CM

## 2014-02-20 DIAGNOSIS — I714 Abdominal aortic aneurysm, without rupture, unspecified: Secondary | ICD-10-CM

## 2014-02-20 DIAGNOSIS — R911 Solitary pulmonary nodule: Secondary | ICD-10-CM

## 2014-02-20 DIAGNOSIS — R011 Cardiac murmur, unspecified: Secondary | ICD-10-CM

## 2014-02-20 MED ORDER — ATORVASTATIN CALCIUM 80 MG PO TABS: 80.0000 mg | ORAL_TABLET | Freq: Every day | ORAL | Status: DC

## 2014-02-20 NOTE — Assessment & Plan Note (Signed)
Continue aspirin and statin. Cerebrovascular disease is followed by vascular surgery.

## 2014-02-20 NOTE — Patient Instructions (Signed)
Your physician wants you to follow-up in: [place] will receive a reminder letter in the mail two months in advance. If you don't receive a letter, please call our office to schedule the follow-up appointment.   Your physician has requested that you have an echocardiogram. Echocardiography is a painless test that uses sound waves to create images of your heart. It provides your doctor with information about the size and shape of your heart and how well your heart's chambers and valves are working. This procedure takes approximately one hour. There are no restrictions for this procedure.SCHEDULE IN 6 WEEKS  STOP PLAVIX  STOP SIMVASTATIN  START ATORVASTATIN 80 MG ONCE DAILY  Your physician recommends that you return for lab work in: [place] TO LAB WORK
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
## 2014-02-20 NOTE — Assessment & Plan Note (Signed)
Discontinue Zocor. Begin Lipitor 80 mg daily. Check lipids and liver in 6 weeks. 

## 2014-02-20 NOTE — Assessment & Plan Note (Signed)
Followed by vascular surgery. 

## 2014-02-20 NOTE — Assessment & Plan Note (Signed)
Followup chest CT October 2015.

## 2014-02-20 NOTE — Assessment & Plan Note (Signed)
Blood pressure controlled. Continue present medications. Check potassium and renal function. 

## 2014-02-20 NOTE — Assessment & Plan Note (Signed)
Continue aspirin and statin. 

## 2014-02-20 NOTE — Assessment & Plan Note (Signed)
Plan followup chest CT in October of 2015. Note he is being seen at [facility] for abdominal aortic aneurysm and may have had a CT scan prior to then. I have asked him to bring that with next office visit and would potentially could avoid a repeat study.

## 2014-02-20 NOTE — Assessment & Plan Note (Signed)
Continue aspirin and statin. Discontinue Plavix. 

## 2014-02-20 NOTE — Assessment & Plan Note (Signed)
Continue hydralazine, nitrates and beta blocker. Not on an ACE inhibitor as this caused renal insufficiency previously.

## 2014-02-20 NOTE — Assessment & Plan Note (Signed)
Repeat echocardiogram. 

## 2014-03-30 ENCOUNTER — Ambulatory Visit (HOSPITAL_COMMUNITY): Payer: Medicare HMO | Attending: Cardiology | Admitting: Cardiology

## 2014-03-30 ENCOUNTER — Other Ambulatory Visit (INDEPENDENT_AMBULATORY_CARE_PROVIDER_SITE_OTHER): Payer: Medicare HMO

## 2014-03-30 DIAGNOSIS — R011 Cardiac murmur, unspecified: Secondary | ICD-10-CM

## 2014-03-30 DIAGNOSIS — I2510 Atherosclerotic heart disease of native coronary artery without angina pectoris: Secondary | ICD-10-CM

## 2014-03-30 LAB — LIPID PANEL
CHOL/HDL RATIO: 2
Cholesterol: 87 mg/dL (ref 0–200)
HDL: 38.1 mg/dL — AB (ref >39.00)
LDL Cholesterol: 35 mg/dL (ref 0–99)
NONHDL: 48.9
TRIGLYCERIDES: 68 mg/dL (ref 0.0–149.0)
VLDL: 13.6 mg/dL (ref 0.0–40.0)

## 2014-03-30 LAB — BASIC METABOLIC PANEL
BUN: 20 mg/dL (ref 6–23)
CALCIUM: 9.1 mg/dL (ref 8.4–10.5)
CHLORIDE: 105 meq/L (ref 96–112)
CO2: 26 meq/L (ref 19–32)
CREATININE: 1.3 mg/dL (ref 0.4–1.5)
GFR: 57.81 mL/min — ABNORMAL LOW (ref >60.00)
Glucose, Bld: 116 mg/dL — ABNORMAL HIGH (ref 70–99)
Potassium: 4.6 mEq/L (ref 3.5–5.1)
SODIUM: 139 meq/L (ref 135–145)

## 2014-03-30 LAB — HEPATIC FUNCTION PANEL
ALT: 17 U/L (ref 0–53)
AST: 23 U/L (ref 0–37)
Albumin: 3.9 g/dL (ref 3.5–5.2)
Alkaline Phosphatase: 80 U/L (ref 39–117)
Bilirubin, Direct: 0.1 mg/dL (ref 0.0–0.3)
TOTAL PROTEIN: 7 g/dL (ref 6.0–8.3)
Total Bilirubin: 0.5 mg/dL (ref 0.2–1.2)

## 2014-03-30 NOTE — Progress Notes (Signed)
Echo performed. 

## 2014-04-06 ENCOUNTER — Telehealth: Payer: Self-pay | Admitting: Internal Medicine

## 2014-04-06 NOTE — Telephone Encounter (Addendum)
Pt has [employer] ins and needs a referral to dr Asencion Noble for copd  appt on 04-15-14 and dr [name] allred cardiologist to discuss ICD. Pt has appt on 04/24/14

## 2014-04-07 NOTE — Telephone Encounter (Signed)
Done submitted referrals

## 2014-04-15 ENCOUNTER — Ambulatory Visit (INDEPENDENT_AMBULATORY_CARE_PROVIDER_SITE_OTHER): Payer: Medicare HMO | Admitting: Critical Care Medicine

## 2014-04-15 ENCOUNTER — Encounter: Payer: Self-pay | Admitting: Critical Care Medicine

## 2014-04-15 VITALS — BP 94/60 | HR 67 | Ht 67.0 in | Wt 157.0 lb

## 2014-04-15 DIAGNOSIS — I255 Ischemic cardiomyopathy: Secondary | ICD-10-CM

## 2014-04-15 DIAGNOSIS — J449 Chronic obstructive pulmonary disease, unspecified: Secondary | ICD-10-CM

## 2014-04-15 MED ORDER — METHYLPREDNISOLONE ACETATE 80 MG/ML IJ SUSP
120.0000 mg | Freq: Once | INTRAMUSCULAR | Status: AC
  Administered 2014-04-15: 120 mg via INTRAMUSCULAR

## 2014-04-15 NOTE — Patient Instructions (Signed)
Start oxygen 2lpm at rest and with exertion Depomedrol 120mg  given today No changes in medications Follow up with Dr. Joya Gaskins the 1st or 2nd week of September 2015

## 2014-04-15 NOTE — Progress Notes (Signed)
Subjective:    Patient ID: [name], male    DOB: July 03, 1951, 63 y.o.   MRN: [id number]  HPI  63 y.o.  with known history of with known history of COPD , current smoker . Golds Stage III  FeV1 39%   04/15/2014 Chief Complaint  Patient presents with  . 2 month follow up    DOE is unchanged.  Has prod cough with clear to white mucus.  Occas chest tightness.    Pt arrived sats mid 70s.  Notes white mucus to clear. No cigarettes. No real chest pain.  No qhs dyspnea.  PPI caused abd pain, now more heartburn and use zantac prn.   Echo recently EF 30-35% Some edema in the feet Pt has AAA being followed  Pt denies any significant sore throat, nasal congestion or excess secretions, fever, chills, sweats, unintended weight loss, pleurtic or exertional chest pain, orthopnea PND, or leg swelling Pt denies any increase in rescue therapy over baseline, denies waking up needing it or having any early am or nocturnal exacerbations of coughing/wheezing/or dyspnea. Pt also denies any obvious fluctuation in symptoms with  weather or environmental change or other alleviating or aggravating factors    Review of Systems  Constitutional:   No  weight loss, night sweats,  Fevers, chills,  +fatigue, or  lassitude.  HEENT:   No headaches,  Difficulty swallowing,  Tooth/dental problems, or  Sore throat,                No sneezing, itching, ear ache,  +nasal congestion, post nasal drip,   CV:  No chest pain,  Orthopnea, PND, swelling in lower extremities, anasarca, dizziness, palpitations, syncope.   GI  No heartburn, indigestion, abdominal pain, nausea, vomiting, diarrhea, change in bowel habits, loss of appetite, bloody stools.   Resp: Notes shortness of breath with exertion but not  at rest.   No chest wall deformity  Skin: no rash or lesions.  GU: no dysuria, change in color of urine, no urgency or frequency.  No flank pain, no hematuria   MS:  No joint pain or swelling.  No decreased range  of motion.  No back pain.  Psych:  No change in mood or affect. No depression or anxiety.  No memory loss.     Objective:   Physical Exam  Filed Vitals:   04/15/14 1207 04/15/14 1216  BP: 94/60   Pulse: 73 67  Height: 5\' 7"  (1.702 m)   Weight: 157 lb (71.215 kg)   SpO2: 87% 95%    Gen: Pleasant,  in no distress,  normal affect  ENT: No lesions,  mouth clear,  oropharynx clear, no postnasal drip Edentulous with dentures in place  Neck: No JVD, no TMG, no carotid bruits  Lungs: tr exp wheeze  ,distant breath sounds  Cardiovascular: RRR, heart sounds normal, no murmur or gallops, no peripheral edema  Abdomen: soft and NT, no HSM,  BS normal  Musculoskeletal: No deformities, no cyanosis or clubbing  Neuro: alert, non focal              Skin: Warm, no lesions or rashes      Assessment & Plan:   Gold D. COPD with frequent exacerbations Copd gold D with frequent exacerbations Plan Start oxygen 2lpm at rest and with exertion Depomedrol 120mg  given today No changes in medications    Updated Medication List Outpatient Encounter Prescriptions as of 04/15/2014  Medication Sig  . albuterol (PROVENTIL HFA;VENTOLIN HFA) 108 (  90 BASE) MCG/ACT inhaler Inhale 2 puffs into the lungs every 6 (six) hours as needed.  Marland Kitchen albuterol (PROVENTIL) (5 MG/ML) 0.5% nebulizer solution Take 0.5 mLs (2.5 mg total) by nebulization 3 (three) times daily. Dx: 496  . ALPRAZolam (XANAX) 0.25 MG tablet Take 1 tablet (0.25 mg total) by mouth 3 (three) times daily as needed for anxiety.  Marland Kitchen amitriptyline (ELAVIL) 25 MG tablet Take 1 tablet (25 mg total) by mouth at bedtime.  Marland Kitchen aspirin 325 MG tablet Take 325 mg by mouth daily.   Marland Kitchen atorvastatin (LIPITOR) 80 MG tablet Take 1 tablet (80 mg total) by mouth daily.  Marland Kitchen b complex vitamins tablet Take 1 tablet by mouth daily.   . bisoprolol (ZEBETA) 5 MG tablet Take 1 tablet (5 mg total) by mouth daily.  . budesonide-formoterol (SYMBICORT) 160-4.5 MCG/ACT  inhaler Take 2 puffs first thing in am and then another 2 puffs about 12 hours later.  . Ferrous Sulfate (IRON) 325 (65 FE) MG TABS Take 1 tablet by mouth daily.  . fluticasone (FLONASE) 50 MCG/ACT nasal spray Place 2 sprays into both nostrils daily.  . hydrALAZINE (APRESOLINE) 10 MG tablet Take 1 tablet (10 mg total) by mouth 3 (three) times daily.  Marland Kitchen ipratropium (ATROVENT) 0.02 % nebulizer solution Take 2.5 mLs (0.5 mg total) by nebulization 3 (three) times daily. Dx: 496  . isosorbide dinitrate (ISORDIL) 30 MG tablet Take 0.5 tablets (15 mg total) by mouth daily.  Marland Kitchen KRILL OIL 1000 MG CAPS Take 1 capsule by mouth daily.  Marland Kitchen morphine (MSIR) 15 MG tablet Take 1 tablet (15 mg total) by mouth every 8 (eight) hours.  . multivitamin (THERAGRAN) per tablet Take 1 tablet by mouth daily.   . polyethylene glycol (MIRALAX / GLYCOLAX) packet Take 17 g by mouth as needed.   . roflumilast (DALIRESP) 500 MCG TABS tablet Take 1 tablet (500 mcg total) by mouth daily.  . [DISCONTINUED] pantoprazole (PROTONIX) 40 MG tablet Take 1 tablet (40 mg total) by mouth daily.  . [EXPIRED] methylPREDNISolone acetate (DEPO-MEDROL) injection 120 mg

## 2014-04-16 NOTE — Assessment & Plan Note (Signed)
Copd gold D with frequent exacerbations Plan Start oxygen 2lpm at rest and with exertion Depomedrol 120mg  given today No changes in medications

## 2014-04-23 ENCOUNTER — Other Ambulatory Visit: Payer: Self-pay

## 2014-04-24 ENCOUNTER — Ambulatory Visit (INDEPENDENT_AMBULATORY_CARE_PROVIDER_SITE_OTHER): Payer: Commercial Managed Care - HMO | Admitting: Internal Medicine

## 2014-04-24 ENCOUNTER — Encounter: Payer: Self-pay | Admitting: Internal Medicine

## 2014-04-24 VITALS — BP 101/61 | HR 59 | Ht 67.0 in | Wt 149.0 lb

## 2014-04-24 DIAGNOSIS — I255 Ischemic cardiomyopathy: Secondary | ICD-10-CM

## 2014-04-24 DIAGNOSIS — I2589 Other forms of chronic ischemic heart disease: Secondary | ICD-10-CM

## 2014-04-24 DIAGNOSIS — J449 Chronic obstructive pulmonary disease, unspecified: Secondary | ICD-10-CM

## 2014-04-24 DIAGNOSIS — F17200 Nicotine dependence, unspecified, uncomplicated: Secondary | ICD-10-CM

## 2014-04-24 DIAGNOSIS — I519 Heart disease, unspecified: Secondary | ICD-10-CM

## 2014-04-24 DIAGNOSIS — I739 Peripheral vascular disease, unspecified: Secondary | ICD-10-CM

## 2014-04-24 DIAGNOSIS — R911 Solitary pulmonary nodule: Secondary | ICD-10-CM

## 2014-04-24 DIAGNOSIS — I2510 Atherosclerotic heart disease of native coronary artery without angina pectoris: Secondary | ICD-10-CM

## 2014-04-24 DIAGNOSIS — Z720 Tobacco use: Secondary | ICD-10-CM

## 2014-04-24 NOTE — Patient Instructions (Signed)
Your physician recommends that you schedule a follow-up appointment as needed  Your physician has recommended that you have a defibrillator inserted. An implantable cardioverter defibrillator (ICD) is a small device that is placed in your chest or, in rare cases, your abdomen. This device uses electrical pulses or shocks to help control life-threatening, irregular heartbeats that could lead the heart to suddenly stop beating (sudden cardiac arrest). Leads are attached to the ICD that goes into your heart. This is done in the hospital and usually requires an overnight stay.     Smoking Cessation Quitting smoking is important to your health and has many advantages. However, it is not always easy to quit since nicotine is a very addictive drug. Oftentimes, people try 3 times or more before being able to quit. This document explains the best ways for you to prepare to quit smoking. Quitting takes hard work and a lot of effort, but you can do it. ADVANTAGES OF QUITTING SMOKING  You will live longer, feel better, and live better.  Your body will feel the impact of quitting smoking almost immediately.  Within 20 minutes, blood pressure decreases. Your pulse returns to its normal level.  After 8 hours, carbon monoxide levels in the blood return to normal. Your oxygen level increases.  After 24 hours, the chance of having a heart attack starts to decrease. Your breath, hair, and body stop smelling like smoke.  After 48 hours, damaged nerve endings begin to recover. Your sense of taste and smell improve.  After 72 hours, the body is virtually free of nicotine. Your bronchial tubes relax and breathing becomes easier.  After 2 to 12 weeks, lungs can hold more air. Exercise becomes easier and circulation improves.  The risk of having a heart attack, stroke, cancer, or lung disease is greatly reduced.  After 1 year, the risk of coronary heart disease is cut in half.  After 5 years, the risk of stroke  falls to the same as a nonsmoker.  After 10 years, the risk of lung cancer is cut in half and the risk of other cancers decreases significantly.  After 15 years, the risk of coronary heart disease drops, usually to the level of a nonsmoker.  If you are pregnant, quitting smoking will improve your chances of having a healthy baby.  The people you live with, especially any children, will be healthier.  You will have extra money to spend on things other than cigarettes. QUESTIONS TO THINK ABOUT BEFORE ATTEMPTING TO QUIT You may want to talk about your answers with your health care provider.  Why do you want to quit?  If you tried to quit in the past, what helped and what did not?  What will be the most difficult situations for you after you quit? How will you plan to handle them?  Who can help you through the tough times? Your family? Friends? A health care provider?  What pleasures do you get from smoking? What ways can you still get pleasure if you quit? Here are some questions to ask your health care provider:  How can you help me to be successful at quitting?  What medicine do you think would be best for me and how should I take it?  What should I do if I need more help?  What is smoking withdrawal like? How can I get information on withdrawal? GET READY  Set a quit date.  Change your environment by getting rid of all cigarettes, ashtrays, matches, and lighters  in your home, car, or work. Do not let people smoke in your home.  Review your past attempts to quit. Think about what worked and what did not. GET SUPPORT AND ENCOURAGEMENT You have a better chance of being successful if you have help. You can get support in many ways.  Tell your family, friends, and coworkers that you are going to quit and need their support. Ask them not to smoke around you.  Get individual, group, or telephone counseling and support. Programs are available at [employer] and health centers.  Call your local health department for information about programs in your area.  Spiritual beliefs and practices may help some smokers quit.  Download a "quit meter" on your computer to keep track of quit statistics, such as how long you have gone without smoking, cigarettes not smoked, and money saved.  Get a self-help book about quitting smoking and staying off tobacco. [place] yourself from urges to smoke. Talk to someone, go for a walk, or occupy your time with a task.  Change your normal routine. Take a different route to work. Drink tea instead of coffee. Eat breakfast in a different place.  Reduce your stress. Take a hot bath, exercise, or read a book.  Plan something enjoyable to do every day. Reward yourself for not smoking.  Explore interactive web-based programs that specialize in helping you quit. GET MEDICINE AND USE IT CORRECTLY Medicines can help you stop smoking and decrease the urge to smoke. Combining medicine with the above behavioral methods and support can greatly increase your chances of successfully quitting smoking.  Nicotine replacement therapy helps deliver nicotine to your body without the negative effects and risks of smoking. Nicotine replacement therapy includes nicotine gum, lozenges, inhalers, nasal sprays, and skin patches. Some may be available over-the-counter and others require a prescription.  Antidepressant medicine helps people abstain from smoking, but how this works is unknown. This medicine is available by prescription.  Nicotinic receptor partial agonist medicine simulates the effect of nicotine in your brain. This medicine is available by prescription. Ask your health care provider for advice about which medicines to use and how to use them based on your health history. Your health care provider will tell you what side effects to look out for if you choose to be on a medicine or therapy. Carefully read the  information on the package. Do not use any other product containing nicotine while using a nicotine replacement product.  RELAPSE OR DIFFICULT SITUATIONS Most relapses occur within the first 3 months after quitting. Do not be discouraged if you start smoking again. Remember, most people try several times before finally quitting. You may have symptoms of withdrawal because your body is used to nicotine. You may crave cigarettes, be irritable, feel very hungry, cough often, get headaches, or have difficulty concentrating. The withdrawal symptoms are only temporary. They are strongest when you first quit, but they will go away within 10-14 days. To reduce the chances of relapse, try to:  Avoid drinking alcohol. Drinking lowers your chances of successfully quitting.  Reduce the amount of caffeine you consume. Once you quit smoking, the amount of caffeine in your body increases and can give you symptoms, such as a rapid heartbeat, sweating, and anxiety.  Avoid smokers because they can make you want to smoke.  Do not let weight gain distract you. Many smokers will gain weight when they quit, usually less than 10 pounds. Eat a healthy diet  and stay active. You can always lose the weight gained after you quit.  Find ways to improve your mood other than smoking. FOR MORE INFORMATION  www.smokefree.gov  Document Released: 09/12/2001 Document Revised: 02/02/2014 Document Reviewed: 12/28/2011 [facility] Patient Information 2015 [place], [place]. This information is not intended to replace advice given to you by your health care provider. Make sure you discuss any questions you have with your health care provider.

## 2014-04-26 ENCOUNTER — Encounter: Payer: Self-pay | Admitting: Internal Medicine

## 2014-04-26 DIAGNOSIS — Z720 Tobacco use: Secondary | ICD-10-CM | POA: Insufficient documentation

## 2014-04-26 DIAGNOSIS — I5032 Chronic diastolic (congestive) heart failure: Secondary | ICD-10-CM | POA: Insufficient documentation
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
## 2014-04-27 ENCOUNTER — Ambulatory Visit (INDEPENDENT_AMBULATORY_CARE_PROVIDER_SITE_OTHER): Payer: Commercial Managed Care - HMO | Admitting: Internal Medicine

## 2014-04-27 ENCOUNTER — Encounter: Payer: Self-pay | Admitting: Internal Medicine

## 2014-04-27 VITALS — BP 104/70 | HR 68 | Temp 98.4°F | Ht 67.0 in | Wt 150.0 lb

## 2014-04-27 DIAGNOSIS — M545 Low back pain, unspecified: Secondary | ICD-10-CM

## 2014-04-27 DIAGNOSIS — J449 Chronic obstructive pulmonary disease, unspecified: Secondary | ICD-10-CM

## 2014-04-27 DIAGNOSIS — G8929 Other chronic pain: Secondary | ICD-10-CM

## 2014-04-27 DIAGNOSIS — I10 Essential (primary) hypertension: Secondary | ICD-10-CM

## 2014-04-27 MED ORDER — ALPRAZOLAM 0.25 MG PO TABS: 0.2500 mg | ORAL_TABLET | Freq: Three times a day (TID) | ORAL | Status: DC | PRN

## 2014-04-27 MED ORDER — MORPHINE SULFATE 15 MG PO TABS: 15.0000 mg | ORAL_TABLET | Freq: Three times a day (TID) | ORAL | Status: DC

## 2014-04-27 MED ORDER — DOXYCYCLINE HYCLATE 100 MG PO TABS
100.0000 mg | ORAL_TABLET | Freq: Two times a day (BID) | ORAL | Status: DC
Start: 2014-04-27 — End: 2014-06-05

## 2014-04-27 NOTE — Assessment & Plan Note (Signed)
Patient using oxygen overnight. On exam he has wheezing.  Continue maintenance inhalers.  Treat with doxycycline 100 mg twice daily for 10 days.  Patient plans on smoking cessation starting tomorrow. He has already purchased patches.

## 2014-04-27 NOTE — Assessment & Plan Note (Signed)
I advised against higher frequency of narcotics considering his severe COPD.
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
## 2014-04-27 NOTE — Assessment & Plan Note (Addendum)
BP well controlled.  Patient experienced acute renal failure with trial of low-dose losartan 25 mg in the past.  He may not be able to tolerate rechallenge with ACE or ARB.  12/19/12 - CT Angio of abd showed - Left renal: High-grade stenosis at the origin of the left renal artery.  Right renal: Moderate narrowing of the main right renal artery appears unchanged

## 2014-04-27 NOTE — Progress Notes (Signed)
Pre visit review using our clinic review tool, if applicable. No additional management support is needed unless otherwise documented below in the visit note. 

## 2014-06-05 ENCOUNTER — Ambulatory Visit (INDEPENDENT_AMBULATORY_CARE_PROVIDER_SITE_OTHER): Payer: Commercial Managed Care - HMO | Admitting: Critical Care Medicine

## 2014-06-05 ENCOUNTER — Encounter: Payer: Self-pay | Admitting: Critical Care Medicine

## 2014-06-05 VITALS — BP 110/60 | HR 76 | Temp 97.5°F | Ht 67.0 in | Wt 165.6 lb

## 2014-06-05 DIAGNOSIS — J449 Chronic obstructive pulmonary disease, unspecified: Secondary | ICD-10-CM

## 2014-06-05 DIAGNOSIS — Z720 Tobacco use: Secondary | ICD-10-CM

## 2014-06-05 MED ORDER — AZITHROMYCIN 250 MG PO TABS: ORAL_TABLET | ORAL | Status: DC

## 2014-06-05 MED ORDER — PREDNISONE 10 MG PO TABS: ORAL_TABLET | ORAL | Status: DC

## 2014-06-05 MED ORDER — BUDESONIDE-FORMOTEROL FUMARATE 160-4.5 MCG/ACT IN AERO: INHALATION_SPRAY | RESPIRATORY_TRACT | Status: DC

## 2014-06-05 NOTE — Patient Instructions (Signed)
A light weight oxygen tank will be obtained  PRednisone 10mg  Take 4 for three days 3 for three days 2 for three days 1 for three days and stop Azithromycin 250mg  Take two once then one daily until gone No change in medication  Return 2 months

## 2014-06-05 NOTE — Progress Notes (Signed)
Subjective:    Patient ID: [name], male    DOB: 07-18-1951, 63 y.o.   MRN: [id number]  HPI  63 y.o.  with known history of with known history of COPD , current smoker . Golds Stage III  FeV1 39%   06/05/2014 Chief Complaint  Patient presents with  . 6 wk follow up    o2 sat 86%RA upon arrival to exam room.  DOE is unchanged.  Increased cough x 4 days with clear to white mucus.  Chest tightness at times.    Only wears oxygen nighttime oxygen.  Notes some mucus, clear to white.  No chest pain.  Notes some wheezing.  No f/c/s. The patient is still smoking 6-8 cigarettes per day  Review of Systems  Constitutional:   No  weight loss, night sweats,  Fevers, chills,  +fatigue, or  lassitude.  HEENT:   No headaches,  Difficulty swallowing,  Tooth/dental problems, or  Sore throat,                No sneezing, itching, ear ache,  +nasal congestion, post nasal drip,   CV:  No chest pain,  Orthopnea, PND, swelling in lower extremities, anasarca, dizziness, palpitations, syncope.   GI  No heartburn, indigestion, abdominal pain, nausea, vomiting, diarrhea, change in bowel habits, loss of appetite, bloody stools.   Resp: Notes shortness of breath with exertion but not  at rest.   No chest wall deformity  Skin: no rash or lesions.  GU: no dysuria, change in color of urine, no urgency or frequency.  No flank pain, no hematuria   MS:  No joint pain or swelling.  No decreased range of motion.  No back pain.  Psych:  No change in mood or affect. No depression or anxiety.  No memory loss.     Objective:   Physical Exam  Filed Vitals:   06/05/14 1159 06/05/14 1200  BP:  110/60  Pulse:  76  Temp:  97.5 F (36.4 C)  TempSrc:  Oral  Height:  5\' 7"  (1.702 m)  Weight:  165 lb 9.6 oz (75.116 kg)  SpO2: 86% 91%    Gen: Pleasant,  in no distress,  normal affect  ENT: No lesions,  mouth clear,  oropharynx clear, no postnasal drip Edentulous with dentures in place  Neck: No JVD, no  TMG, no carotid bruits  Lungs: tr exp wheeze  ,distant breath sounds  Cardiovascular: RRR, heart sounds normal, no murmur or gallops, no peripheral edema  Abdomen: soft and NT, no HSM,  BS normal  Musculoskeletal: No deformities, no cyanosis or clubbing  Neuro: alert, non focal              Skin: Warm, no lesions or rashes      Assessment & Plan:   Gold D. COPD with frequent exacerbations Gold stage D. COPD the patient is currently still smoking about 6 cigarettes per day Ongoing exacerbation Plan A light weight oxygen tank will be obtained  PRednisone 10mg  Take 4 for three days 3 for three days 2 for three days 1 for three days and stop Azithromycin 250mg  Take two once then one daily until gone No change in medication  Return 2 months  Note the patient was given extensive smoking cessation counseling at this visit  Tobacco abuse Ongoing tobacco use as noted   Updated Medication List Outpatient Encounter Prescriptions as of 06/05/2014  Medication Sig  . albuterol (PROVENTIL) (5 MG/ML) 0.5% nebulizer solution Take 0.5  mLs (2.5 mg total) by nebulization 3 (three) times daily. Dx: 496  . ALPRAZolam (XANAX) 0.25 MG tablet Take 1 tablet (0.25 mg total) by mouth 3 (three) times daily as needed for anxiety.  Marland Kitchen amitriptyline (ELAVIL) 25 MG tablet Take 1 tablet (25 mg total) by mouth at bedtime.  Marland Kitchen aspirin 325 MG tablet Take 325 mg by mouth daily.   Marland Kitchen atorvastatin (LIPITOR) 80 MG tablet Take 1 tablet (80 mg total) by mouth daily.  Marland Kitchen b complex vitamins tablet Take 1 tablet by mouth daily.   . bisoprolol (ZEBETA) 5 MG tablet Take 1 tablet (5 mg total) by mouth daily.  . Ferrous Sulfate (IRON) 325 (65 FE) MG TABS Take 1 tablet by mouth daily.  . fluticasone (FLONASE) 50 MCG/ACT nasal spray Place 2 sprays into both nostrils daily.  . hydrALAZINE (APRESOLINE) 10 MG tablet Take 1 tablet (10 mg total) by mouth 3 (three) times daily.  Marland Kitchen ipratropium (ATROVENT) 0.02 % nebulizer solution  Take 2.5 mLs (0.5 mg total) by nebulization 3 (three) times daily. Dx: 496  . isosorbide dinitrate (ISORDIL) 30 MG tablet Take 0.5 tablets (15 mg total) by mouth daily.  Marland Kitchen KRILL OIL 1000 MG CAPS Take 1 capsule by mouth daily.  Marland Kitchen morphine (MSIR) 15 MG tablet Take 1 tablet (15 mg total) by mouth every 8 (eight) hours.  . multivitamin (THERAGRAN) per tablet Take 1 tablet by mouth daily.   . polyethylene glycol (MIRALAX / GLYCOLAX) packet Take 17 g by mouth as needed.   . roflumilast (DALIRESP) 500 MCG TABS tablet Take 1 tablet (500 mcg total) by mouth daily.  Marland Kitchen albuterol (PROVENTIL HFA;VENTOLIN HFA) 108 (90 BASE) MCG/ACT inhaler Inhale 2 puffs into the lungs every 6 (six) hours as needed.  Marland Kitchen azithromycin (ZITHROMAX) 250 MG tablet Take two once then one daily until gone  . budesonide-formoterol (SYMBICORT) 160-4.5 MCG/ACT inhaler Take 2 puffs first thing in am and then another 2 puffs about 12 hours later.  . predniSONE (DELTASONE) 10 MG tablet Take 4 for three days 3 for three days 2 for three days 1 for three days and stop  . [DISCONTINUED] budesonide-formoterol (SYMBICORT) 160-4.5 MCG/ACT inhaler Take 2 puffs first thing in am and then another 2 puffs about 12 hours later.  . [DISCONTINUED] doxycycline (VIBRA-TABS) 100 MG tablet Take 1 tablet (100 mg total) by mouth 2 (two) times daily.

## 2014-06-05 NOTE — Assessment & Plan Note (Signed)
Ongoing tobacco use as noted

## 2014-06-05 NOTE — Assessment & Plan Note (Signed)
Gold stage D. COPD the patient is currently still smoking about 6 cigarettes per day Ongoing exacerbation Plan A light weight oxygen tank will be obtained  PRednisone 10mg  Take 4 for three days 3 for three days 2 for three days 1 for three days and stop Azithromycin 250mg  Take two once then one daily until gone No change in medication  Return 2 months  Note the patient was given extensive smoking cessation counseling at this visit

## 2014-06-16 ENCOUNTER — Other Ambulatory Visit: Payer: Self-pay | Admitting: Critical Care Medicine

## 2014-06-18 ENCOUNTER — Ambulatory Visit (INDEPENDENT_AMBULATORY_CARE_PROVIDER_SITE_OTHER): Payer: Commercial Managed Care - HMO | Admitting: Pulmonary Disease

## 2014-06-18 ENCOUNTER — Encounter: Payer: Self-pay | Admitting: Pulmonary Disease

## 2014-06-18 VITALS — BP 110/70 | HR 84 | Temp 98.0°F | Ht 67.0 in | Wt 158.8 lb

## 2014-06-18 DIAGNOSIS — J441 Chronic obstructive pulmonary disease with (acute) exacerbation: Secondary | ICD-10-CM | POA: Insufficient documentation

## 2014-06-18 DIAGNOSIS — J449 Chronic obstructive pulmonary disease, unspecified: Secondary | ICD-10-CM

## 2014-06-18 DIAGNOSIS — J209 Acute bronchitis, unspecified: Secondary | ICD-10-CM | POA: Insufficient documentation

## 2014-06-18 MED ORDER — SULFAMETHOXAZOLE-TMP DS 800-160 MG PO TABS: 1.0000 | ORAL_TABLET | Freq: Two times a day (BID) | ORAL | Status: DC

## 2014-06-18 MED ORDER — HYDROCODONE-HOMATROPINE 5-1.5 MG/5ML PO SYRP: 5.0000 mL | ORAL_SOLUTION | Freq: Four times a day (QID) | ORAL | Status: DC | PRN

## 2014-06-18 MED ORDER — PREDNISONE 10 MG PO TABS: ORAL_TABLET | ORAL | Status: DC

## 2014-06-18 NOTE — Patient Instructions (Signed)
Will treat with bactrim ds, one in am and pm for 7 days Prednisone taper over 8 days hydromet cough syrup one teaspoon every 6 hrs if needed Continue to stay away from cigarettes. Keep your followup with Dr. Joya Gaskins as scheduled, but call us if you are not getting better.

## 2014-06-18 NOTE — Assessment & Plan Note (Signed)
The patient is having increased shortness of breath, increased air trapping subjectively, and worsening shortness of breath. He has wheezing and rhonchi on exam, and will need another course of prednisone to get him through this. He tells me that he has not smoked since his last visit with Dr. Joya Gaskins.

## 2014-06-18 NOTE — Telephone Encounter (Signed)
Pharm requesting 300 mL on Ipratropium Neb rx.

## 2014-06-18 NOTE — Progress Notes (Signed)
   Subjective:    Patient ID: [name], male    DOB: [birth year], 63 y.o.   MRN: [id number]  HPI The patient comes in today for an acute sick visit. He has known COPD is normally followed by Dr. Joya Gaskins. He was seen 2 weeks ago where he had an episode of acute bronchitis and increased shortness of breath, and was treated with a CPAP and a short course of prednisone. The patient tells me that he did get somewhat better, but as soon as he discontinued the medications, he started having more chest congestion and cough. He now has increasing shortness of breath and wheezing as well. It is difficult for him to mobilize the mucus, but when he does, it is brown appearing.   Review of Systems  Constitutional: Negative for fever and unexpected weight change.  HENT: Positive for congestion, postnasal drip, rhinorrhea, sinus pressure and sore throat. Negative for dental problem, ear pain, nosebleeds, sneezing and trouble swallowing.   Eyes: Negative for redness and itching.  Respiratory: Positive for cough, chest tightness, shortness of breath and wheezing.   Cardiovascular: Negative for palpitations and leg swelling.  Gastrointestinal: Negative for nausea and vomiting.  Genitourinary: Negative for dysuria.  Musculoskeletal: Negative for joint swelling.  Skin: Negative for rash.  Neurological: Negative for headaches.  Hematological: Does not bruise/bleed easily.  Psychiatric/Behavioral: Negative for dysphoric mood. The patient is not nervous/anxious.        Objective:   Physical Exam Well-developed male in no acute distress Nose without purulence or discharge noted Oropharynx clear Neck without lymphadenopathy or thyromegaly Chest with diffuse rhonchi and upper and lower airway wheezing. However there is adequate airflow. Cardiac exam with regular rate and rhythm Lower extremities with minimal ankle edema, no cyanosis Alert and oriented, moves all 4 extremities.       Assessment & Plan:

## 2014-06-18 NOTE — Assessment & Plan Note (Signed)
The patient is having increased chest congestion and cough, along with purulent mucus. He was treated with a Z-Pak 2 weeks ago, and it may be that he needs a broader spectrum antibiotic. He has multiple antibiotic allergies, but is able to take sulfa. Will try him on Bactrim.

## 2014-06-26 ENCOUNTER — Ambulatory Visit: Payer: Commercial Managed Care - HMO | Admitting: Internal Medicine

## 2014-06-29 ENCOUNTER — Encounter: Payer: Self-pay | Admitting: Family Medicine

## 2014-06-29 ENCOUNTER — Ambulatory Visit (INDEPENDENT_AMBULATORY_CARE_PROVIDER_SITE_OTHER): Payer: Commercial Managed Care - HMO | Admitting: Family Medicine

## 2014-06-29 ENCOUNTER — Ambulatory Visit: Payer: Commercial Managed Care - HMO | Admitting: Internal Medicine

## 2014-06-29 VITALS — BP 118/80 | HR 84 | Wt 155.0 lb

## 2014-06-29 DIAGNOSIS — G8929 Other chronic pain: Secondary | ICD-10-CM

## 2014-06-29 DIAGNOSIS — E785 Hyperlipidemia, unspecified: Secondary | ICD-10-CM

## 2014-06-29 DIAGNOSIS — Z23 Encounter for immunization: Secondary | ICD-10-CM

## 2014-06-29 DIAGNOSIS — M545 Low back pain, unspecified: Secondary | ICD-10-CM

## 2014-06-29 DIAGNOSIS — J441 Chronic obstructive pulmonary disease with (acute) exacerbation: Secondary | ICD-10-CM

## 2014-06-29 DIAGNOSIS — I10 Essential (primary) hypertension: Secondary | ICD-10-CM

## 2014-06-29 MED ORDER — DOXYCYCLINE HYCLATE 100 MG PO CAPS: 100.0000 mg | ORAL_CAPSULE | Freq: Two times a day (BID) | ORAL | Status: DC

## 2014-06-29 MED ORDER — MORPHINE SULFATE 15 MG PO TABS: 15.0000 mg | ORAL_TABLET | Freq: Three times a day (TID) | ORAL | Status: DC

## 2014-06-29 MED ORDER — ISOSORBIDE DINITRATE 30 MG PO TABS: 15.0000 mg | ORAL_TABLET | Freq: Every day | ORAL | Status: DC

## 2014-06-29 MED ORDER — PREDNISONE 10 MG PO TABS: ORAL_TABLET | ORAL | Status: DC

## 2014-06-29 MED ORDER — MORPHINE SULFATE 15 MG PO TABS: 15.0000 mg | ORAL_TABLET | Freq: Three times a day (TID) | ORAL | Status: DC | PRN

## 2014-06-29 NOTE — Progress Notes (Signed)
Pre visit review using our clinic review tool, if applicable. No additional management support is needed unless otherwise documented below in the visit note. 
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
## 2014-07-16 ENCOUNTER — Ambulatory Visit (INDEPENDENT_AMBULATORY_CARE_PROVIDER_SITE_OTHER)
Admission: RE | Admit: 2014-07-16 | Discharge: 2014-07-16 | Disposition: A | Discharge status: Home / Self Care | Payer: Commercial Managed Care - HMO | Source: Ambulatory Visit | Attending: Adult Health | Admitting: Adult Health

## 2014-07-16 ENCOUNTER — Encounter: Payer: Self-pay | Admitting: Adult Health

## 2014-07-16 ENCOUNTER — Ambulatory Visit (INDEPENDENT_AMBULATORY_CARE_PROVIDER_SITE_OTHER): Payer: Commercial Managed Care - HMO | Admitting: Adult Health

## 2014-07-16 ENCOUNTER — Other Ambulatory Visit (INDEPENDENT_AMBULATORY_CARE_PROVIDER_SITE_OTHER): Payer: Commercial Managed Care - HMO

## 2014-07-16 VITALS — BP 110/84 | HR 72 | Ht 67.0 in | Wt 169.6 lb

## 2014-07-16 DIAGNOSIS — I509 Heart failure, unspecified: Secondary | ICD-10-CM

## 2014-07-16 DIAGNOSIS — J441 Chronic obstructive pulmonary disease with (acute) exacerbation: Secondary | ICD-10-CM

## 2014-07-16 LAB — BASIC METABOLIC PANEL
BUN: 20 mg/dL (ref 6–23)
CHLORIDE: 98 meq/L (ref 96–112)
CO2: 31 mEq/L (ref 19–32)
Calcium: 9.3 mg/dL (ref 8.4–10.5)
Creatinine, Ser: 1.3 mg/dL (ref 0.4–1.5)
GFR: 61.5 mL/min (ref >60.00)
GLUCOSE: 101 mg/dL — AB (ref 70–99)
POTASSIUM: 4.7 meq/L (ref 3.5–5.1)
Sodium: 138 mEq/L (ref 135–145)

## 2014-07-16 LAB — BRAIN NATRIURETIC PEPTIDE: Pro B Natriuretic peptide (BNP): 195 pg/mL — ABNORMAL HIGH (ref 0.0–100.0)

## 2014-07-16 MED ORDER — PREDNISONE 10 MG PO TABS: ORAL_TABLET | ORAL | Status: DC

## 2014-07-16 MED ORDER — FUROSEMIDE 20 MG PO TABS: 20.0000 mg | ORAL_TABLET | Freq: Every day | ORAL | Status: DC

## 2014-07-16 MED ORDER — HYDROCODONE-HOMATROPINE 5-1.5 MG/5ML PO SYRP: 5.0000 mL | ORAL_SOLUTION | Freq: Four times a day (QID) | ORAL | Status: DC | PRN

## 2014-07-16 NOTE — Assessment & Plan Note (Signed)
Recurrent Exacerbation  Check cxr and labs  Check sputum cx  ?fluid overload contributing to symptoms with wt gain and ankle edema  Frequent prednisone contributing to fluid retention   Plan  Prednisone taper over next week.  Lasix 20mg  daily for next 3 days .  Low salt diet.  Mucinex DM Twice daily  As needed  Cough/congestion  Sputum culture today .  Chest xray and labs today .  follow up Dr. Joya Gaskins  In 2 weeks and As needed   Please contact office for sooner follow up if symptoms do not improve or worsen or seek emergency care
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
## 2014-07-16 NOTE — Patient Instructions (Addendum)
Prednisone taper over next week.  Lasix 20mg  daily for next 3 days .  Low salt diet.  Mucinex DM Twice daily  As needed  Cough/congestion  Sputum culture today .  Chest xray and labs today .  follow up Dr. Joya Gaskins  In 2 weeks and As needed   Please contact office for sooner follow up if symptoms do not improve or worsen or seek emergency care

## 2014-07-17 NOTE — Progress Notes (Signed)
Quick Note:  ATC pt x1. Line rang numerous times with no answer and no option to LM. Will call back. ______

## 2014-07-21 NOTE — Progress Notes (Signed)
Quick Note:  Called spoke with patient, advised of lab results / recs as stated by TP. Pt verbalized his understanding and denied any questions. ______ 

## 2014-07-21 NOTE — Progress Notes (Signed)
Quick Note:  Called spoke with patient, advised of cxr results / recs as stated by TP. Pt verbalized his understanding and denied any questions. ______ 

## 2014-07-22 ENCOUNTER — Other Ambulatory Visit: Payer: Self-pay | Admitting: Physician Assistant

## 2014-07-23 ENCOUNTER — Other Ambulatory Visit: Payer: Self-pay

## 2014-08-03 ENCOUNTER — Ambulatory Visit (INDEPENDENT_AMBULATORY_CARE_PROVIDER_SITE_OTHER): Payer: Commercial Managed Care - HMO | Admitting: Critical Care Medicine

## 2014-08-03 ENCOUNTER — Encounter: Payer: Self-pay | Admitting: Critical Care Medicine

## 2014-08-03 VITALS — BP 106/70 | HR 72 | Temp 98.8°F | Ht 67.0 in | Wt 174.2 lb

## 2014-08-03 DIAGNOSIS — J449 Chronic obstructive pulmonary disease, unspecified: Secondary | ICD-10-CM

## 2014-08-03 MED ORDER — BUDESONIDE-FORMOTEROL FUMARATE 160-4.5 MCG/ACT IN AERO: INHALATION_SPRAY | RESPIRATORY_TRACT | Status: DC

## 2014-08-03 NOTE — Progress Notes (Signed)
Subjective:    Patient ID: [name], male    DOB: 04-08-1951, 63 y.o.   MRN: [id number]  HPI  63 y.o.  with known history of with known history of COPD , current smoker . Golds Stage III  FeV1 39% Hx of CHF .  08/03/2014 Chief Complaint  Patient presents with  . 2 wk follow up    SOB and cough have improved.  Cough is prod with small amount of clear to white mucus.  Occas wheezing. No chest tightness/pain or f/c/s.  Now is better after last ov with NP.  Now min prod cough.  No f/c/s. No real edema in feet.  No real chest pain.  Review of Systems  Constitutional:   No  weight loss, night sweats,  Fevers, chills,  +fatigue, or  lassitude.  HEENT:   No headaches,  Difficulty swallowing,  Tooth/dental problems, or  Sore throat,                No sneezing, itching, ear ache,  +nasal congestion, post nasal drip,   CV:  No chest pain,  Orthopnea, PND, , anasarca, dizziness, palpitations, syncope.   GI  No heartburn, indigestion, abdominal pain, nausea, vomiting, diarrhea, change in bowel habits, loss of appetite, bloody stools.   Resp: Notes shortness of breath with exertion but not  at rest.   No chest wall deformity  Skin: no rash or lesions.  GU: no dysuria, change in color of urine, no urgency or frequency.  No flank pain, no hematuria   MS:  No joint pain or swelling.  No decreased range of motion.  No back pain.  Psych:  No change in mood or affect. No depression or anxiety.  No memory loss.     Objective:   Physical Exam  Filed Vitals:   08/03/14 1049  BP: 106/70  Pulse: 72  Temp: 98.8 F (37.1 C)  TempSrc: Oral  Height: 5\' 7"  (1.702 m)  Weight: 174 lb 3.2 oz (79.017 kg)  SpO2: 93%    Gen: Pleasant,  in no distress,  normal affect  ENT: No lesions,  mouth clear,  oropharynx clear, no postnasal drip  Edentulous with dentures in place  Neck: No JVD, no TMG, no carotid bruits  Lungs: no wheezes  ,distant breath sounds  Cardiovascular: RRR, heart  sounds normal, no murmur or gallops, no peripheral edema  Abdomen: soft and NT, no HSM,  BS normal  Musculoskeletal: No deformities, no cyanosis or clubbing  Neuro: alert, non focal              Skin: Warm, no lesions or rashes       Assessment & Plan:   COPD with chronic bronchitis Gold D Copd with chronic bronchitis and flare , now improved Plan No change in inhaled meds Pt up to date on vaccines    Updated Medication List Outpatient Encounter Prescriptions as of 08/03/2014  Medication Sig  . albuterol (PROVENTIL) (5 MG/ML) 0.5% nebulizer solution Take 2.5 mg by nebulization QID. Dx: 496  . ALPRAZolam (XANAX) 0.25 MG tablet Take 1 tablet (0.25 mg total) by mouth 3 (three) times daily as needed for anxiety.  Marland Kitchen amitriptyline (ELAVIL) 25 MG tablet Take 1 tablet (25 mg total) by mouth at bedtime.  Marland Kitchen aspirin 325 MG tablet Take 325 mg by mouth daily.   Marland Kitchen atorvastatin (LIPITOR) 80 MG tablet Take 1 tablet (80 mg total) by mouth daily.  Marland Kitchen b complex vitamins tablet Take 1 tablet  by mouth daily.   . bisoprolol (ZEBETA) 5 MG tablet Take 1 tablet (5 mg total) by mouth daily.  . budesonide-formoterol (SYMBICORT) 160-4.5 MCG/ACT inhaler Take 2 puffs first thing in am and then another 2 puffs about 12 hours later.  . Ferrous Sulfate (IRON) 325 (65 FE) MG TABS Take 1 tablet by mouth daily.  . fluticasone (FLONASE) 50 MCG/ACT nasal spray Place 2 sprays into both nostrils daily.  . hydrALAZINE (APRESOLINE) 10 MG tablet TAKE ONE TABLET BY MOUTH THREE TIMES DAILY  . ipratropium (ATROVENT) 0.02 % nebulizer solution USE ONE VIAL IN NEBULIZER 4 TIMES DAILY  . isosorbide dinitrate (ISORDIL) 30 MG tablet Take 0.5 tablets (15 mg total) by mouth daily.  Marland Kitchen KRILL OIL 1000 MG CAPS Take 1 capsule by mouth daily.  Marland Kitchen morphine (MSIR) 15 MG tablet Take 1 tablet (15 mg total) by mouth every 8 (eight) hours as needed. May refill in two month.  . multivitamin (THERAGRAN) per tablet Take 1 tablet by mouth daily.    . polyethylene glycol (MIRALAX / GLYCOLAX) packet Take 17 g by mouth as needed.   . roflumilast (DALIRESP) 500 MCG TABS tablet Take 1 tablet (500 mcg total) by mouth daily.  . [DISCONTINUED] budesonide-formoterol (SYMBICORT) 160-4.5 MCG/ACT inhaler Take 2 puffs first thing in am and then another 2 puffs about 12 hours later.  Marland Kitchen albuterol (PROVENTIL HFA;VENTOLIN HFA) 108 (90 BASE) MCG/ACT inhaler Inhale 2 puffs into the lungs every 6 (six) hours as needed.  Marland Kitchen HYDROcodone-homatropine (HYDROMET) 5-1.5 MG/5ML syrup Take 5 mLs by mouth every 6 (six) hours as needed.  . [DISCONTINUED] furosemide (LASIX) 20 MG tablet Take 1 tablet (20 mg total) by mouth daily.  . [DISCONTINUED] predniSONE (DELTASONE) 10 MG tablet 4 tabs for 3 days, then 3 tabs for 3 days, 2 tabs for 3 days, then 1 tab for 3 days, then stop

## 2014-08-03 NOTE — Assessment & Plan Note (Addendum)
Gold D Copd with chronic bronchitis and flare , now improved Plan No change in inhaled meds Pt up to date on vaccines

## 2014-08-03 NOTE — Patient Instructions (Signed)
No change in medications. Return in        2 months 

## 2014-08-05 ENCOUNTER — Ambulatory Visit: Payer: Commercial Managed Care - HMO | Admitting: Critical Care Medicine

## 2014-08-07 ENCOUNTER — Other Ambulatory Visit: Payer: Self-pay | Admitting: Internal Medicine

## 2014-08-11 ENCOUNTER — Ambulatory Visit (INDEPENDENT_AMBULATORY_CARE_PROVIDER_SITE_OTHER): Payer: Commercial Managed Care - HMO | Admitting: Adult Health

## 2014-08-11 ENCOUNTER — Encounter: Payer: Self-pay | Admitting: Adult Health

## 2014-08-11 ENCOUNTER — Other Ambulatory Visit (INDEPENDENT_AMBULATORY_CARE_PROVIDER_SITE_OTHER): Payer: Commercial Managed Care - HMO

## 2014-08-11 VITALS — BP 98/68 | HR 89 | Temp 97.2°F | Ht 67.0 in | Wt 176.0 lb

## 2014-08-11 DIAGNOSIS — R0600 Dyspnea, unspecified: Secondary | ICD-10-CM

## 2014-08-11 DIAGNOSIS — I519 Heart disease, unspecified: Secondary | ICD-10-CM

## 2014-08-11 DIAGNOSIS — J441 Chronic obstructive pulmonary disease with (acute) exacerbation: Secondary | ICD-10-CM

## 2014-08-11 LAB — BASIC METABOLIC PANEL
BUN: 17 mg/dL (ref 6–23)
CALCIUM: 9.4 mg/dL (ref 8.4–10.5)
CO2: 31 mEq/L (ref 19–32)
Chloride: 104 mEq/L (ref 96–112)
Creatinine, Ser: 1.2 mg/dL (ref 0.4–1.5)
GFR: 63.24 mL/min (ref >60.00)
Glucose, Bld: 100 mg/dL — ABNORMAL HIGH (ref 70–99)
POTASSIUM: 4.8 meq/L (ref 3.5–5.1)
Sodium: 143 mEq/L (ref 135–145)

## 2014-08-11 LAB — BRAIN NATRIURETIC PEPTIDE: PRO B NATRI PEPTIDE: 258 pg/mL — AB (ref 0.0–100.0)

## 2014-08-11 MED ORDER — FUROSEMIDE 20 MG PO TABS: ORAL_TABLET | ORAL | Status: DC

## 2014-08-11 NOTE — Patient Instructions (Addendum)
Begin Lasix 40mg  daily today , then 20mg  daily for additional 2 days  Labs today  Low salt diet  Keep legs elevated.  Need follow up with cardiology in next week for CHF.  Please contact office for sooner follow up if symptoms do not improve or worsen or seek emergency care  Follow up Dr. Joya Gaskins  As planned and As needed

## 2014-08-11 NOTE — Assessment & Plan Note (Signed)
Decompensation with fluid overload  Check labs again with bnp  Wt trending up  Admits to increased salty food intake, diet discussed Refer back to cardiology   Plan  Begin Lasix 40mg  daily today , then 20mg  daily for additional 2 days  Labs today  Low salt diet  Keep legs elevated.  Need follow up with cardiology in next week for CHF.  Please contact office for sooner follow up if symptoms do not improve or worsen or seek emergency care  Follow up Dr. Joya Gaskins  As planned and As needed

## 2014-08-11 NOTE — Assessment & Plan Note (Signed)
Recent exacerbation, now resolved Encouraged on smoking cessation  Plan  Continue on current regimen  Follow up Dr. Joya Gaskins  As planned and As needed
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
## 2014-08-13 NOTE — Progress Notes (Signed)
Quick Note:  Spoke with pt and notified of results per Tammy Parrett, NP. Pt verbalized understanding and denied any questions.  ______ 

## 2014-08-14 ENCOUNTER — Encounter: Payer: Self-pay | Admitting: Cardiology

## 2014-08-14 ENCOUNTER — Ambulatory Visit (INDEPENDENT_AMBULATORY_CARE_PROVIDER_SITE_OTHER): Payer: Commercial Managed Care - HMO | Admitting: Cardiology

## 2014-08-14 VITALS — BP 100/70 | HR 81

## 2014-08-14 DIAGNOSIS — I712 Thoracic aortic aneurysm, without rupture, unspecified: Secondary | ICD-10-CM

## 2014-08-14 DIAGNOSIS — R911 Solitary pulmonary nodule: Secondary | ICD-10-CM

## 2014-08-14 DIAGNOSIS — I701 Atherosclerosis of renal artery: Secondary | ICD-10-CM

## 2014-08-14 DIAGNOSIS — I255 Ischemic cardiomyopathy: Secondary | ICD-10-CM

## 2014-08-14 DIAGNOSIS — I2583 Coronary atherosclerosis due to lipid rich plaque: Secondary | ICD-10-CM

## 2014-08-14 DIAGNOSIS — Z720 Tobacco use: Secondary | ICD-10-CM

## 2014-08-14 DIAGNOSIS — I714 Abdominal aortic aneurysm, without rupture, unspecified: Secondary | ICD-10-CM

## 2014-08-14 DIAGNOSIS — I519 Heart disease, unspecified: Secondary | ICD-10-CM

## 2014-08-14 DIAGNOSIS — I2510 Atherosclerotic heart disease of native coronary artery without angina pectoris: Secondary | ICD-10-CM

## 2014-08-14 MED ORDER — FUROSEMIDE 20 MG PO TABS: 20.0000 mg | ORAL_TABLET | Freq: Every day | ORAL | Status: DC

## 2014-08-14 NOTE — Assessment & Plan Note (Signed)
Followed by vascular surgery. 

## 2014-08-14 NOTE — Assessment & Plan Note (Signed)
Patient encouraged to avoid.

## 2014-08-14 NOTE — Assessment & Plan Note (Signed)
Continue aspirin and statin. Given history of bilateral renal artery stenosis and renal insufficiency we will avoid ACE inhibitor or ARB's.
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
## 2014-08-14 NOTE — Assessment & Plan Note (Signed)
Continue beta blocker, hydralazine and nitrates. No ACE inhibitor as this caused significant renal insufficiency previously.

## 2014-08-14 NOTE — Patient Instructions (Addendum)
Your physician recommends that you schedule a follow-up appointment in: [place]   START FUROSEMIDE 20 MG ONCE DAILY IN THE MORNING  Your physician recommends that you return for lab work in: ONE WEEK  Non-Cardiac CT scanning, (CAT scanning), is a noninvasive, special x-ray that produces cross-sectional images of the body using x-rays and a computer. CT scans help physicians diagnose and treat medical conditions. For some CT exams, a contrast material is used to enhance visibility in the area of the body being studied. CT scans provide greater clarity and reveal more details than regular x-ray exams.CT OF CHEST WO TO FOLLOW UP LING NODULE=AT [address] LOCATION

## 2014-08-14 NOTE — Assessment & Plan Note (Signed)
Followed by vascular surgery.Continue aspirin and statin. 

## 2014-08-14 NOTE — Assessment & Plan Note (Signed)
Continue aspirin and statin. 

## 2014-08-14 NOTE — Assessment & Plan Note (Signed)
Blood pressure controlled. Continue present medications. 

## 2014-08-14 NOTE — Assessment & Plan Note (Signed)
Patient has developed mild pedal edema. Add Lasix 20 mg daily. Check potassium and renal function in 1 week.He previously declined ICD.

## 2014-08-14 NOTE — Assessment & Plan Note (Signed)
Plan to repeat noncontrast chest CT.

## 2014-08-14 NOTE — Assessment & Plan Note (Signed)
We'll plan repeat CTA in 1 year unless this is performed by vascular surgery.

## 2014-08-14 NOTE — Assessment & Plan Note (Signed)
Continue statin. 

## 2014-08-20 ENCOUNTER — Other Ambulatory Visit: Payer: Self-pay | Admitting: Critical Care Medicine

## 2014-08-22 LAB — BASIC METABOLIC PANEL WITH GFR
BUN: 20 mg/dL (ref 6–23)
CO2: 28 mEq/L (ref 19–32)
Calcium: 9 mg/dL (ref 8.4–10.5)
Chloride: 105 mEq/L (ref 96–112)
Creat: 1.07 mg/dL (ref 0.50–1.35)
GFR, EST AFRICAN AMERICAN: 84 mL/min
GFR, Est Non African American: 72 mL/min
Glucose, Bld: 115 mg/dL — ABNORMAL HIGH (ref 70–99)
POTASSIUM: 5 meq/L (ref 3.5–5.3)
SODIUM: 142 meq/L (ref 135–145)

## 2014-08-24 ENCOUNTER — Telehealth: Payer: Self-pay | Admitting: Internal Medicine

## 2014-08-24 NOTE — Telephone Encounter (Signed)
Pt needs new rx morphine °

## 2014-08-24 NOTE — Telephone Encounter (Signed)
Ok to RF x 2 

## 2014-08-25 ENCOUNTER — Ambulatory Visit (INDEPENDENT_AMBULATORY_CARE_PROVIDER_SITE_OTHER)
Admission: RE | Admit: 2014-08-25 | Discharge: 2014-08-25 | Disposition: A | Discharge status: Home / Self Care | Payer: Commercial Managed Care - HMO | Source: Ambulatory Visit | Attending: Cardiology | Admitting: Cardiology

## 2014-08-25 DIAGNOSIS — R911 Solitary pulmonary nodule: Secondary | ICD-10-CM

## 2014-08-25 MED ORDER — MORPHINE SULFATE 15 MG PO TABS: 15.0000 mg | ORAL_TABLET | Freq: Three times a day (TID) | ORAL | Status: DC | PRN

## 2014-08-25 NOTE — Telephone Encounter (Signed)
Pt can pick up rx's tomorrow 08/25/14.  Pt aware

## 2014-08-26 ENCOUNTER — Encounter: Payer: Self-pay | Admitting: Cardiology

## 2014-08-26 NOTE — Telephone Encounter (Signed)
Pt called in wanting to speak to a nurse about some

## 2014-08-26 NOTE — Telephone Encounter (Signed)
This encounter was created in error - please disregard.

## 2014-09-02 ENCOUNTER — Other Ambulatory Visit: Payer: Self-pay | Admitting: Internal Medicine

## 2014-09-03 ENCOUNTER — Other Ambulatory Visit: Payer: Self-pay | Admitting: Physician Assistant

## 2014-09-10 ENCOUNTER — Encounter (HOSPITAL_COMMUNITY): Payer: Self-pay | Admitting: Cardiology

## 2014-09-14 ENCOUNTER — Ambulatory Visit (INDEPENDENT_AMBULATORY_CARE_PROVIDER_SITE_OTHER): Payer: Commercial Managed Care - HMO | Admitting: Internal Medicine

## 2014-09-14 ENCOUNTER — Encounter: Payer: Self-pay | Admitting: Internal Medicine

## 2014-09-14 VITALS — BP 102/70 | HR 72 | Temp 98.3°F | Ht 67.0 in | Wt 164.0 lb

## 2014-09-14 DIAGNOSIS — I255 Ischemic cardiomyopathy: Secondary | ICD-10-CM

## 2014-09-14 DIAGNOSIS — J449 Chronic obstructive pulmonary disease, unspecified: Secondary | ICD-10-CM

## 2014-09-14 DIAGNOSIS — M545 Low back pain, unspecified: Secondary | ICD-10-CM

## 2014-09-14 DIAGNOSIS — L989 Disorder of the skin and subcutaneous tissue, unspecified: Secondary | ICD-10-CM

## 2014-09-14 MED ORDER — MORPHINE SULFATE 15 MG PO TABS: 15.0000 mg | ORAL_TABLET | Freq: Three times a day (TID) | ORAL | Status: DC | PRN

## 2014-09-14 MED ORDER — AMITRIPTYLINE HCL 25 MG PO TABS: 25.0000 mg | ORAL_TABLET | Freq: Every day | ORAL | Status: DC

## 2014-09-14 MED ORDER — BISOPROLOL FUMARATE 5 MG PO TABS: 5.0000 mg | ORAL_TABLET | Freq: Every day | ORAL | Status: DC
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
## 2014-09-14 NOTE — Assessment & Plan Note (Signed)
He is at baseline.  No change in medication.  I encouraged complete smoking cessation.

## 2014-09-14 NOTE — Assessment & Plan Note (Signed)
No significant change.  Patient not surgical candidate.  Continue same dose of oxycodone.  Prescription for 2 months provided.

## 2014-09-14 NOTE — Assessment & Plan Note (Signed)
Patient has scaly circular skin lesion left upper neck (nonhealing). Area suspicious in appearance for squamous cell carcinoma. Refer to dermatologist for further evaluation/biopsy. (Dr. Allyson Sabal)

## 2014-09-14 NOTE — Progress Notes (Signed)
Pre visit review using our clinic review tool, if applicable. No additional management support is needed unless otherwise documented below in the visit note. 

## 2014-09-14 NOTE — Assessment & Plan Note (Signed)
Agree with avoiding ACE inhibitor.  Managed by cardiology

## 2014-10-05 ENCOUNTER — Encounter: Payer: Self-pay | Admitting: Critical Care Medicine

## 2014-10-05 ENCOUNTER — Ambulatory Visit (INDEPENDENT_AMBULATORY_CARE_PROVIDER_SITE_OTHER): Payer: Commercial Managed Care - HMO | Admitting: Critical Care Medicine

## 2014-10-05 VITALS — BP 108/66 | HR 71 | Temp 98.2°F | Ht 67.0 in | Wt 164.8 lb

## 2014-10-05 DIAGNOSIS — J449 Chronic obstructive pulmonary disease, unspecified: Secondary | ICD-10-CM

## 2014-10-05 MED ORDER — ALBUTEROL SULFATE 108 (90 BASE) MCG/ACT IN AEPB: 2.0000 | INHALATION_SPRAY | Freq: Four times a day (QID) | RESPIRATORY_TRACT | Status: DC | PRN

## 2014-10-05 MED ORDER — ALBUTEROL SULFATE (5 MG/ML) 0.5% IN NEBU: 2.5000 mg | INHALATION_SOLUTION | Freq: Four times a day (QID) | RESPIRATORY_TRACT | Status: DC

## 2014-10-05 MED ORDER — IPRATROPIUM BROMIDE 0.02 % IN SOLN: RESPIRATORY_TRACT | Status: DC

## 2014-10-05 NOTE — Progress Notes (Signed)
Subjective:    Patient ID: [name], male    DOB: [birth year], 64 y.o.   MRN: [id number]  HPI 10/05/2014 Chief Complaint  Patient presents with  . 6 wk follow up    SOB has improved.  BLE edema resolved with lasix.  Cough at baseline with clear to white mucus.  Occas chest tightness.  No CP or f/c/s.  overall improved from prior visit Pt denies any significant sore throat, nasal congestion or excess secretions, fever, chills, sweats, unintended weight loss, pleurtic or exertional chest pain, orthopnea PND, or leg swelling Pt denies any increase in rescue therapy over baseline, denies waking up needing it or having any early am or nocturnal exacerbations of coughing/wheezing/or dyspnea. Pt also denies any obvious fluctuation in symptoms with  weather or environmental change or other alleviating or aggravating factors      Review of Systems Constitutional:   No  weight loss, night sweats,  Fevers, chills, fatigue, lassitude. HEENT:   No headaches,  Difficulty swallowing,  Tooth/dental problems,  Sore throat,                No sneezing, itching, ear ache, nasal congestion, post nasal drip,   CV:  No chest pain,  Orthopnea, PND, swelling in lower extremities, anasarca, dizziness, palpitations  GI  No heartburn, indigestion, abdominal pain, nausea, vomiting, diarrhea, change in bowel habits, loss of appetite  Resp: No shortness of breath with exertion or at rest.  No excess mucus, no productive cough,  No non-productive cough,  No coughing up of blood.  No change in color of mucus.  No wheezing.  No chest wall deformity  Skin: no rash or lesions.  GU: no dysuria, change in color of urine, no urgency or frequency.  No flank pain.  MS:  No joint pain or swelling.  No decreased range of motion.  No back pain.  Psych:  No change in mood or affect. No depression or anxiety.  No memory loss.     Objective:   Physical Exam Filed Vitals:   10/05/14 1106  BP: 108/66  Pulse: 71    Temp: 98.2 F (36.8 C)  TempSrc: Oral  Height: 5\' 7"  (1.702 m)  Weight: 164 lb 12.8 oz (74.753 kg)  SpO2: 96%    Gen: Pleasant, well-nourished, in no distress,  normal affect  ENT: No lesions,  mouth clear,  oropharynx clear, no postnasal drip  Neck: No JVD, no TMG, no carotid bruits  Lungs: No use of accessory muscles, no dullness to percussion, distant BS  Cardiovascular: RRR, heart sounds normal, no murmur or gallops, no peripheral edema  Abdomen: soft and NT, no HSM,  BS normal  Musculoskeletal: No deformities, no cyanosis or clubbing  Neuro: alert, non focal  Skin: Warm, no lesions or rashes  No results found.        Assessment & Plan:   COPD with chronic bronchitis Gold D Copd with recent tracheobronchitis and flare now improved Plan Cont inhaled meds Ok to be off oxygen daytime   Updated Medication List Outpatient Encounter Prescriptions as of 10/05/2014  Medication Sig  . albuterol (PROVENTIL) (5 MG/ML) 0.5% nebulizer solution Take 0.5 mLs (2.5 mg total) by nebulization QID. Dx: 496  . ALPRAZolam (XANAX) 0.25 MG tablet Take 1 tablet (0.25 mg total) by mouth 3 (three) times daily as needed for anxiety.  Marland Kitchen amitriptyline (ELAVIL) 25 MG tablet Take 1 tablet (25 mg total) by mouth at bedtime.  Marland Kitchen aspirin 325 MG  tablet Take 325 mg by mouth daily.   Marland Kitchen atorvastatin (LIPITOR) 80 MG tablet Take 1 tablet (80 mg total) by mouth daily.  Marland Kitchen b complex vitamins tablet Take 1 tablet by mouth daily.   . bisoprolol (ZEBETA) 5 MG tablet Take 1 tablet (5 mg total) by mouth daily.  . budesonide-formoterol (SYMBICORT) 160-4.5 MCG/ACT inhaler Take 2 puffs first thing in am and then another 2 puffs about 12 hours later.  . Ferrous Sulfate (IRON) 325 (65 FE) MG TABS Take 1 tablet by mouth daily.  . fluticasone (FLONASE) 50 MCG/ACT nasal spray USE TWO SPRAY(S) IN EACH NOSTRIL ONCE DAILY  . furosemide (LASIX) 20 MG tablet Take 1 tablet (20 mg total) by mouth daily.  . hydrALAZINE  (APRESOLINE) 10 MG tablet TAKE ONE TABLET BY MOUTH THREE TIMES DAILY  . ipratropium (ATROVENT) 0.02 % nebulizer solution USE ONE VIAL IN NEBULIZER 4 TIMES DAILY  . isosorbide dinitrate (ISORDIL) 30 MG tablet Take 0.5 tablets (15 mg total) by mouth daily.  Marland Kitchen KRILL OIL 1000 MG CAPS Take 1 capsule by mouth daily.  Marland Kitchen morphine (MSIR) 15 MG tablet Take 1 tablet (15 mg total) by mouth every 8 (eight) hours as needed.  . multivitamin (THERAGRAN) per tablet Take 1 tablet by mouth daily.   . polyethylene glycol (MIRALAX / GLYCOLAX) packet Take 17 g by mouth as needed.   . roflumilast (DALIRESP) 500 MCG TABS tablet Take 1 tablet (500 mcg total) by mouth daily.  . [DISCONTINUED] albuterol (PROVENTIL) (5 MG/ML) 0.5% nebulizer solution Take 2.5 mg by nebulization QID. Dx: 496  . [DISCONTINUED] ipratropium (ATROVENT) 0.02 % nebulizer solution USE ONE VIAL IN NEBULIZER 4 TIMES DAILY  . Albuterol Sulfate (PROAIR RESPICLICK) 314 (90 BASE) MCG/ACT AEPB Inhale 2 puffs into the lungs every 6 (six) hours as needed.  . [DISCONTINUED] albuterol (PROVENTIL HFA;VENTOLIN HFA) 108 (90 BASE) MCG/ACT inhaler Inhale 2 puffs into the lungs every 6 (six) hours as needed. (Patient not taking: Reported on 10/05/2014)

## 2014-10-05 NOTE — Patient Instructions (Signed)
You can stay off oxygen daytime, use at night Refill on nebulizer medications sent to pharmacy Return 4 months

## 2014-10-05 NOTE — Assessment & Plan Note (Signed)
Gold D Copd with recent tracheobronchitis and flare now improved Plan Cont inhaled meds Ok to be off oxygen daytime

## 2014-10-13 ENCOUNTER — Other Ambulatory Visit: Payer: Self-pay | Admitting: Dermatology

## 2014-10-13 DIAGNOSIS — C4441 Basal cell carcinoma of skin of scalp and neck: Secondary | ICD-10-CM | POA: Diagnosis not present

## 2014-10-14 ENCOUNTER — Ambulatory Visit (INDEPENDENT_AMBULATORY_CARE_PROVIDER_SITE_OTHER): Payer: Commercial Managed Care - HMO | Admitting: Pulmonary Disease

## 2014-10-14 ENCOUNTER — Encounter: Payer: Self-pay | Admitting: Pulmonary Disease

## 2014-10-14 VITALS — BP 104/60 | HR 87 | Temp 97.0°F | Ht 67.0 in | Wt 162.2 lb

## 2014-10-14 DIAGNOSIS — J449 Chronic obstructive pulmonary disease, unspecified: Secondary | ICD-10-CM

## 2014-10-14 DIAGNOSIS — J441 Chronic obstructive pulmonary disease with (acute) exacerbation: Secondary | ICD-10-CM | POA: Diagnosis not present

## 2014-10-14 MED ORDER — DOXYCYCLINE HYCLATE 100 MG PO TABS: 100.0000 mg | ORAL_TABLET | Freq: Two times a day (BID) | ORAL | Status: DC

## 2014-10-14 MED ORDER — PREDNISONE 10 MG PO TABS: ORAL_TABLET | ORAL | Status: DC

## 2014-10-14 MED ORDER — HYDROCODONE-HOMATROPINE 5-1.5 MG/5ML PO SYRP: 5.0000 mL | ORAL_SOLUTION | ORAL | Status: DC | PRN

## 2014-10-14 NOTE — Patient Instructions (Signed)
Start doxycycline 100mg  am and pm for next 7 days. Prednisone taper over 8 days. hydromet cough syrup one teaspoon every 4 hrs if needed (6 ounces) Let us know if you are not improving.

## 2014-10-14 NOTE — Assessment & Plan Note (Signed)
The patient's history is most consistent with an acute bronchitis complicated by a COPD exacerbation. Has acute bronchospasm on exam today, but he is in no respiratory distress and has adequate oxygen saturations on room air. At this point, will treat him with an antibiotic as well as a course of prednisone. The patient has an allergy to fluoroquinolones, penicillin, as well as a cephalosporin. We'll therefore treat him with doxycycline, which he has gotten in the past.

## 2014-10-14 NOTE — Progress Notes (Signed)
   Subjective:    Patient ID: [name], male    DOB: 08-26-1951, 64 y.o.   MRN: [id number]  HPI The patient comes in today for an acute sick visit. He is normally followed by Dr. Joya Gaskins with COPD, and gives a 2 day history of increasing chest congestion, cough with thick mucus, and increasing shortness of breath. He is also had low-grade fever, but no chest discomfort. He has been staying on his bronchodilator regimen compliantly.   Review of Systems  Constitutional: Positive for fever. Negative for unexpected weight change.  HENT: Positive for congestion and postnasal drip. Negative for dental problem, ear pain, nosebleeds, rhinorrhea, sinus pressure, sneezing, sore throat and trouble swallowing.   Eyes: Negative for redness and itching.  Respiratory: Positive for cough, chest tightness, shortness of breath and wheezing.   Cardiovascular: Negative for palpitations and leg swelling.  Gastrointestinal: Negative for nausea and vomiting.  Genitourinary: Negative for dysuria.  Musculoskeletal: Negative for joint swelling.  Skin: Negative for rash.  Neurological: Negative for headaches.  Hematological: Does not bruise/bleed easily.  Psychiatric/Behavioral: Negative for dysphoric mood. The patient is not nervous/anxious.        Objective:   Physical Exam Well-developed male in no acute distress Nose without purulence or discharge noted Neck without lymphadenopathy or thyromegaly Chest with diffuse rhonchi and some lower airway wheezing, also prominent upper airway pseudo wheezing. There was adequate airflow and no respiratory distress. Cardiac exam with regular rate and rhythm Lower extremities without edema, no cyanosis Alert and oriented, moves all 4 extremities.       Assessment & Plan:

## 2014-10-16 DIAGNOSIS — J449 Chronic obstructive pulmonary disease, unspecified: Secondary | ICD-10-CM | POA: Diagnosis not present

## 2014-10-20 ENCOUNTER — Encounter: Payer: Self-pay | Admitting: Adult Health

## 2014-10-20 ENCOUNTER — Ambulatory Visit (INDEPENDENT_AMBULATORY_CARE_PROVIDER_SITE_OTHER): Payer: Commercial Managed Care - HMO | Admitting: Adult Health

## 2014-10-20 VITALS — BP 110/68 | HR 79 | Temp 97.7°F | Ht 67.0 in | Wt 162.8 lb

## 2014-10-20 DIAGNOSIS — J441 Chronic obstructive pulmonary disease with (acute) exacerbation: Secondary | ICD-10-CM | POA: Diagnosis not present

## 2014-10-20 MED ORDER — HYDROCODONE-HOMATROPINE 5-1.5 MG/5ML PO SYRP: 5.0000 mL | ORAL_SOLUTION | Freq: Four times a day (QID) | ORAL | Status: DC | PRN

## 2014-10-20 MED ORDER — PREDNISONE 10 MG PO TABS: ORAL_TABLET | ORAL | Status: DC

## 2014-10-20 MED ORDER — AZITHROMYCIN 250 MG PO TABS: ORAL_TABLET | ORAL | Status: AC
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
## 2014-10-20 NOTE — Assessment & Plan Note (Signed)
Slow to resolve   Plan  Zpack take as directed.  Prednisone taper over next week.  Mucinex DM Twice daily  As needed  Cough/congestion  Follow up with Dr. Joya Gaskins  As planned and As needed   Please contact office for sooner follow up if symptoms do not improve or worsen or seek emergency care

## 2014-10-20 NOTE — Patient Instructions (Addendum)
Zpack take as directed.  Prednisone taper over next week.  Mucinex DM Twice daily  As needed  Cough/congestion  Follow up with Dr. Joya Gaskins  As planned and As needed   Please contact office for sooner follow up if symptoms do not improve or worsen or seek emergency care

## 2014-10-21 ENCOUNTER — Other Ambulatory Visit: Payer: Self-pay | Admitting: Internal Medicine

## 2014-10-22 NOTE — Telephone Encounter (Signed)
Ok x3 per Dr Shawna Orleans

## 2014-11-09 ENCOUNTER — Other Ambulatory Visit: Payer: Self-pay | Admitting: Critical Care Medicine

## 2014-11-16 ENCOUNTER — Ambulatory Visit: Payer: Commercial Managed Care - HMO | Admitting: Internal Medicine

## 2014-11-16 DIAGNOSIS — J449 Chronic obstructive pulmonary disease, unspecified: Secondary | ICD-10-CM | POA: Diagnosis not present
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
## 2014-11-20 ENCOUNTER — Encounter: Payer: Self-pay | Admitting: Cardiology

## 2014-11-20 ENCOUNTER — Ambulatory Visit (INDEPENDENT_AMBULATORY_CARE_PROVIDER_SITE_OTHER): Payer: Commercial Managed Care - HMO | Admitting: Cardiology

## 2014-11-20 VITALS — BP 128/60 | HR 76 | Ht 67.0 in | Wt 162.7 lb

## 2014-11-20 DIAGNOSIS — I701 Atherosclerosis of renal artery: Secondary | ICD-10-CM | POA: Diagnosis not present

## 2014-11-20 DIAGNOSIS — I712 Thoracic aortic aneurysm, without rupture, unspecified: Secondary | ICD-10-CM

## 2014-11-20 DIAGNOSIS — I2510 Atherosclerotic heart disease of native coronary artery without angina pectoris: Secondary | ICD-10-CM | POA: Diagnosis not present

## 2014-11-20 DIAGNOSIS — Z720 Tobacco use: Secondary | ICD-10-CM

## 2014-11-20 DIAGNOSIS — I679 Cerebrovascular disease, unspecified: Secondary | ICD-10-CM

## 2014-11-20 DIAGNOSIS — R911 Solitary pulmonary nodule: Secondary | ICD-10-CM

## 2014-11-20 DIAGNOSIS — I714 Abdominal aortic aneurysm, without rupture, unspecified: Secondary | ICD-10-CM

## 2014-11-20 DIAGNOSIS — I10 Essential (primary) hypertension: Secondary | ICD-10-CM | POA: Diagnosis not present

## 2014-11-20 DIAGNOSIS — I2583 Coronary atherosclerosis due to lipid rich plaque: Secondary | ICD-10-CM

## 2014-11-20 DIAGNOSIS — I255 Ischemic cardiomyopathy: Secondary | ICD-10-CM | POA: Diagnosis not present

## 2014-11-20 NOTE — Assessment & Plan Note (Signed)
Continue aspirin and statin. 

## 2014-11-20 NOTE — Assessment & Plan Note (Signed)
Follow-up chest CT November 2016.

## 2014-11-20 NOTE — Patient Instructions (Signed)
Your physician wants you to follow-up in: 6 MONTHS WITH DR CRENSHAW You will receive a reminder letter in the mail two months in advance. If you don't receive a letter, please call our office to schedule the follow-up appointment.  

## 2014-11-20 NOTE — Assessment & Plan Note (Signed)
Blood pressure controlled. Continue present medications. 

## 2014-11-20 NOTE — Assessment & Plan Note (Signed)
Patient has not yet followed up in [facility] as previously directed. I have explained that he needs to do this soon as his aneurysm is nearing need for treatment. I explained that rupture could mean death.

## 2014-11-20 NOTE — Assessment & Plan Note (Signed)
Continue aspirin and statin. Followed by vascular surgery at [facility].

## 2014-11-20 NOTE — Assessment & Plan Note (Signed)
Continue beta blocker, hydralazine and nitrates. He had acute renal failure with ARB previously and known renal artery stenosis. We will therefore avoid ACE inhibitors and ARB's.

## 2014-11-20 NOTE — Assessment & Plan Note (Signed)
Patient has discontinued. 

## 2014-11-20 NOTE — Assessment & Plan Note (Signed)
Continue statin. 

## 2014-11-20 NOTE — Assessment & Plan Note (Signed)
Plan follow-up CTA November 2016.

## 2014-11-27 ENCOUNTER — Encounter: Payer: Self-pay | Admitting: Internal Medicine

## 2014-11-27 ENCOUNTER — Encounter: Payer: Self-pay | Admitting: *Deleted

## 2014-11-27 ENCOUNTER — Ambulatory Visit (INDEPENDENT_AMBULATORY_CARE_PROVIDER_SITE_OTHER): Payer: Commercial Managed Care - HMO | Admitting: Internal Medicine

## 2014-11-27 VITALS — BP 114/66 | HR 82 | Temp 98.5°F | Ht 67.0 in | Wt 159.0 lb

## 2014-11-27 DIAGNOSIS — Z79891 Long term (current) use of opiate analgesic: Secondary | ICD-10-CM | POA: Diagnosis not present

## 2014-11-27 DIAGNOSIS — M545 Low back pain, unspecified: Secondary | ICD-10-CM

## 2014-11-27 DIAGNOSIS — I10 Essential (primary) hypertension: Secondary | ICD-10-CM | POA: Diagnosis not present

## 2014-11-27 DIAGNOSIS — Z79899 Other long term (current) drug therapy: Secondary | ICD-10-CM | POA: Diagnosis not present

## 2014-11-27 DIAGNOSIS — J449 Chronic obstructive pulmonary disease, unspecified: Secondary | ICD-10-CM | POA: Diagnosis not present

## 2014-11-27 LAB — CBC WITH DIFFERENTIAL/PLATELET
BASOS PCT: 0 % (ref 0–1)
Basophils Absolute: 0 10*3/uL (ref 0.0–0.1)
Eosinophils Absolute: 0.2 10*3/uL (ref 0.0–0.7)
Eosinophils Relative: 2 % (ref 0–5)
HCT: 39.3 % (ref 39.0–52.0)
HEMOGLOBIN: 12.9 g/dL — AB (ref 13.0–17.0)
Lymphocytes Relative: 15 % (ref 12–46)
Lymphs Abs: 1.4 10*3/uL (ref 0.7–4.0)
MCH: 30.1 pg (ref 26.0–34.0)
MCHC: 32.8 g/dL (ref 30.0–36.0)
MCV: 91.8 fL (ref 78.0–100.0)
MPV: 9.7 fL (ref 8.6–12.4)
Monocytes Absolute: 0.7 10*3/uL (ref 0.1–1.0)
Monocytes Relative: 7 % (ref 3–12)
NEUTROS ABS: 7.3 10*3/uL (ref 1.7–7.7)
NEUTROS PCT: 76 % (ref 43–77)
Platelets: 187 10*3/uL (ref 150–400)
RBC: 4.28 MIL/uL (ref 4.22–5.81)
RDW: 14 % (ref 11.5–15.5)
WBC: 9.6 10*3/uL (ref 4.0–10.5)

## 2014-11-27 LAB — BASIC METABOLIC PANEL
BUN: 18 mg/dL (ref 6–23)
CO2: 32 mEq/L (ref 19–32)
CREATININE: 1.13 mg/dL (ref 0.50–1.35)
Calcium: 9.3 mg/dL (ref 8.4–10.5)
Chloride: 105 mEq/L (ref 96–112)
Glucose, Bld: 103 mg/dL — ABNORMAL HIGH (ref 70–99)
POTASSIUM: 4.5 meq/L (ref 3.5–5.3)
Sodium: 142 mEq/L (ref 135–145)

## 2014-11-27 MED ORDER — PANTOPRAZOLE SODIUM 40 MG PO TBEC: 40.0000 mg | DELAYED_RELEASE_TABLET | Freq: Every day | ORAL | Status: DC

## 2014-11-27 MED ORDER — MORPHINE SULFATE 15 MG PO TABS
15.0000 mg | ORAL_TABLET | Freq: Three times a day (TID) | ORAL | Status: DC | PRN
Start: 2014-11-27 — End: 2015-02-10

## 2014-11-27 MED ORDER — MORPHINE SULFATE 15 MG PO TABS: 15.0000 mg | ORAL_TABLET | Freq: Three times a day (TID) | ORAL | Status: DC | PRN

## 2014-11-27 NOTE — Assessment & Plan Note (Signed)
Well controlled.  Monitor electrolytes and kidney function. BP: 114/66 mmHg  Lab Results  Component Value Date   NA 142 08/21/2014   K 5.0 08/21/2014   CL 105 08/21/2014   CO2 28 08/21/2014   Lab Results  Component Value Date   CREATININE 1.07 08/21/2014

## 2014-11-27 NOTE — Assessment & Plan Note (Signed)
Stable.  Continue same of oxycodone.  Prescription for 2 months provided.  Check UDS.

## 2014-11-27 NOTE — Progress Notes (Signed)
Pre visit review using our clinic review tool, if applicable. No additional management support is needed unless otherwise documented below in the visit note. 
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
## 2014-11-27 NOTE — Assessment & Plan Note (Signed)
Patient reports his last cigarette was in November 2015. He has had 2 exacerbations in the last 2 months. He has ongoing wheezing on exam. Can clear if GERD is playing a role. Start Protonix 40 mg once daily. I also encouraged patient raise head of his bed at least 10 inches.  Consider maintaining patient on low dose prednisone.

## 2014-12-02 ENCOUNTER — Encounter: Payer: Self-pay | Admitting: Acute Care

## 2014-12-15 DIAGNOSIS — J449 Chronic obstructive pulmonary disease, unspecified: Secondary | ICD-10-CM | POA: Diagnosis not present

## 2014-12-17 ENCOUNTER — Encounter: Payer: Self-pay | Admitting: Internal Medicine

## 2014-12-30 ENCOUNTER — Telehealth: Payer: Self-pay | Admitting: Critical Care Medicine

## 2014-12-30 ENCOUNTER — Encounter: Payer: Self-pay | Admitting: Critical Care Medicine

## 2014-12-30 ENCOUNTER — Ambulatory Visit (INDEPENDENT_AMBULATORY_CARE_PROVIDER_SITE_OTHER): Payer: Commercial Managed Care - HMO | Admitting: Critical Care Medicine

## 2014-12-30 VITALS — BP 110/68 | HR 71 | Ht 67.0 in | Wt 163.0 lb

## 2014-12-30 DIAGNOSIS — J441 Chronic obstructive pulmonary disease with (acute) exacerbation: Secondary | ICD-10-CM

## 2014-12-30 MED ORDER — PREDNISONE 10 MG PO TABS: ORAL_TABLET | ORAL | Status: DC

## 2014-12-30 MED ORDER — HYDROCODONE-HOMATROPINE 5-1.5 MG/5ML PO SYRP: 5.0000 mL | ORAL_SOLUTION | Freq: Four times a day (QID) | ORAL | Status: DC | PRN

## 2014-12-30 MED ORDER — FLUTICASONE PROPIONATE 50 MCG/ACT NA SUSP: NASAL | Status: DC

## 2014-12-30 MED ORDER — AZITHROMYCIN 250 MG PO TABS: ORAL_TABLET | ORAL | Status: DC

## 2014-12-30 NOTE — Telephone Encounter (Signed)
Pt is going to be seen today by PW at 10am. Nothing further was needed.

## 2014-12-30 NOTE — Assessment & Plan Note (Signed)
COPD exacerbation with increased mucus and SOB.  -Zpack -Prednisone 2 week taper -Tussionex at night as needed
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
## 2014-12-30 NOTE — Patient Instructions (Signed)
-  Start taking Z pack, take 500mg  (2 tablets) today and 250mg  tablet once daily until you finish it all.  -Continue using your DuoNeb nebulizer treatment every 4 hours as your doing -Take Tussionex cough syrup at night as needed.  -Return in two months

## 2015-01-05 ENCOUNTER — Ambulatory Visit (INDEPENDENT_AMBULATORY_CARE_PROVIDER_SITE_OTHER): Payer: Commercial Managed Care - HMO | Admitting: Pulmonary Disease

## 2015-01-05 ENCOUNTER — Encounter: Payer: Self-pay | Admitting: Pulmonary Disease

## 2015-01-05 VITALS — BP 112/62 | HR 67 | Temp 98.5°F | Wt 163.0 lb

## 2015-01-05 DIAGNOSIS — I255 Ischemic cardiomyopathy: Secondary | ICD-10-CM | POA: Diagnosis not present

## 2015-01-05 DIAGNOSIS — I10 Essential (primary) hypertension: Secondary | ICD-10-CM | POA: Diagnosis not present

## 2015-01-05 DIAGNOSIS — J449 Chronic obstructive pulmonary disease, unspecified: Secondary | ICD-10-CM

## 2015-01-05 DIAGNOSIS — I716 Thoracoabdominal aortic aneurysm, without rupture, unspecified: Secondary | ICD-10-CM

## 2015-01-05 DIAGNOSIS — I2583 Coronary atherosclerosis due to lipid rich plaque: Secondary | ICD-10-CM

## 2015-01-05 DIAGNOSIS — I519 Heart disease, unspecified: Secondary | ICD-10-CM

## 2015-01-05 DIAGNOSIS — I2510 Atherosclerotic heart disease of native coronary artery without angina pectoris: Secondary | ICD-10-CM

## 2015-01-05 MED ORDER — PREDNISONE 20 MG PO TABS: ORAL_TABLET | ORAL | Status: DC

## 2015-01-05 MED ORDER — METHYLPREDNISOLONE ACETATE 80 MG/ML IJ SUSP
120.0000 mg | Freq: Once | INTRAMUSCULAR | Status: AC
  Administered 2015-01-05: 120 mg via INTRAMUSCULAR

## 2015-01-05 NOTE — Patient Instructions (Signed)
Today we updated your med list in our EPIC system...    Continue your current medications the same...  We decided to increase your MUCINEX 600mg  tabs- take 2 tabs twice daily w/ fluids...  We gave you a Depo shot & increased your PREDNISONE course using 20mg  tabs as follows>    Start w/ one tab twice daily for 4 days...    Then decrease to one tab daily each AM for 4 days...    Then decrease to 1/2 tab daily for 4 days...    Then decrease to 1/2 tab every other day til gone (1/2, 0, 1/2, 0, etc)  Continue all your regular meds from [facility] the same (NEBS, SYMBICORT, DALIRESP, etc...  Call for any questions.Marland KitchenMarland Kitchen
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
## 2015-01-10 ENCOUNTER — Encounter (HOSPITAL_COMMUNITY): Payer: Self-pay | Admitting: *Deleted

## 2015-01-10 ENCOUNTER — Emergency Department (HOSPITAL_COMMUNITY): Payer: Commercial Managed Care - HMO

## 2015-01-10 ENCOUNTER — Emergency Department (HOSPITAL_COMMUNITY)
Admission: EM | Admit: 2015-01-10 | Discharge: 2015-01-10 | Disposition: A | Discharge status: Home / Self Care | Payer: Commercial Managed Care - HMO | Attending: Emergency Medicine | Admitting: Emergency Medicine

## 2015-01-10 DIAGNOSIS — Z87828 Personal history of other (healed) physical injury and trauma: Secondary | ICD-10-CM | POA: Diagnosis not present

## 2015-01-10 DIAGNOSIS — Z7982 Long term (current) use of aspirin: Secondary | ICD-10-CM | POA: Diagnosis not present

## 2015-01-10 DIAGNOSIS — I10 Essential (primary) hypertension: Secondary | ICD-10-CM | POA: Insufficient documentation

## 2015-01-10 DIAGNOSIS — Z79899 Other long term (current) drug therapy: Secondary | ICD-10-CM | POA: Diagnosis not present

## 2015-01-10 DIAGNOSIS — Z880 Allergy status to penicillin: Secondary | ICD-10-CM | POA: Insufficient documentation

## 2015-01-10 DIAGNOSIS — Z862 Personal history of diseases of the blood and blood-forming organs and certain disorders involving the immune mechanism: Secondary | ICD-10-CM | POA: Insufficient documentation

## 2015-01-10 DIAGNOSIS — J441 Chronic obstructive pulmonary disease with (acute) exacerbation: Secondary | ICD-10-CM | POA: Diagnosis not present

## 2015-01-10 DIAGNOSIS — Z87891 Personal history of nicotine dependence: Secondary | ICD-10-CM | POA: Insufficient documentation

## 2015-01-10 DIAGNOSIS — Z9889 Other specified postprocedural states: Secondary | ICD-10-CM | POA: Insufficient documentation

## 2015-01-10 DIAGNOSIS — Z8701 Personal history of pneumonia (recurrent): Secondary | ICD-10-CM | POA: Diagnosis not present

## 2015-01-10 DIAGNOSIS — E785 Hyperlipidemia, unspecified: Secondary | ICD-10-CM | POA: Insufficient documentation

## 2015-01-10 DIAGNOSIS — I252 Old myocardial infarction: Secondary | ICD-10-CM | POA: Insufficient documentation

## 2015-01-10 DIAGNOSIS — R0602 Shortness of breath: Secondary | ICD-10-CM | POA: Diagnosis not present

## 2015-01-10 DIAGNOSIS — I2510 Atherosclerotic heart disease of native coronary artery without angina pectoris: Secondary | ICD-10-CM | POA: Diagnosis not present

## 2015-01-10 DIAGNOSIS — Z7951 Long term (current) use of inhaled steroids: Secondary | ICD-10-CM | POA: Insufficient documentation

## 2015-01-10 LAB — CBC WITH DIFFERENTIAL/PLATELET
BASOS ABS: 0 10*3/uL (ref 0.0–0.1)
Basophils Relative: 0 % (ref 0–1)
EOS ABS: 0 10*3/uL (ref 0.0–0.7)
Eosinophils Relative: 0 % (ref 0–5)
HCT: 42.5 % (ref 39.0–52.0)
Hemoglobin: 14 g/dL (ref 13.0–17.0)
Lymphocytes Relative: 8 % — ABNORMAL LOW (ref 12–46)
Lymphs Abs: 1 10*3/uL (ref 0.7–4.0)
MCH: 31.3 pg (ref 26.0–34.0)
MCHC: 32.9 g/dL (ref 30.0–36.0)
MCV: 94.9 fL (ref 78.0–100.0)
MONO ABS: 0.8 10*3/uL (ref 0.1–1.0)
Monocytes Relative: 6 % (ref 3–12)
NEUTROS PCT: 86 % — AB (ref 43–77)
Neutro Abs: 11.4 10*3/uL — ABNORMAL HIGH (ref 1.7–7.7)
PLATELETS: 161 10*3/uL (ref 150–400)
RBC: 4.48 MIL/uL (ref 4.22–5.81)
RDW: 15.9 % — ABNORMAL HIGH (ref 11.5–15.5)
WBC: 13.3 10*3/uL — ABNORMAL HIGH (ref 4.0–10.5)

## 2015-01-10 LAB — COMPREHENSIVE METABOLIC PANEL
ALBUMIN: 4 g/dL (ref 3.5–5.2)
ALK PHOS: 77 U/L (ref 39–117)
ALT: 29 U/L (ref 0–53)
AST: 29 U/L (ref 0–37)
Anion gap: 11 (ref 5–15)
BUN: 33 mg/dL — AB (ref 6–23)
CALCIUM: 9.7 mg/dL (ref 8.4–10.5)
CHLORIDE: 97 mmol/L (ref 96–112)
CO2: 30 mmol/L (ref 19–32)
Creatinine, Ser: 1.26 mg/dL (ref 0.50–1.35)
GFR calc Af Amer: 67 mL/min — ABNORMAL LOW (ref >90)
GFR, EST NON AFRICAN AMERICAN: 58 mL/min — AB (ref >90)
Glucose, Bld: 142 mg/dL — ABNORMAL HIGH (ref 70–99)
POTASSIUM: 5 mmol/L (ref 3.5–5.1)
SODIUM: 138 mmol/L (ref 135–145)
TOTAL PROTEIN: 6.7 g/dL (ref 6.0–8.3)
Total Bilirubin: 0.8 mg/dL (ref 0.3–1.2)

## 2015-01-10 LAB — BRAIN NATRIURETIC PEPTIDE: B Natriuretic Peptide: 223.6 pg/mL — ABNORMAL HIGH (ref 0.0–100.0)

## 2015-01-10 LAB — I-STAT TROPONIN, ED: Troponin i, poc: 0 ng/mL (ref 0.00–0.08)

## 2015-01-10 MED ORDER — IPRATROPIUM-ALBUTEROL 0.5-2.5 (3) MG/3ML IN SOLN
3.0000 mL | Freq: Once | RESPIRATORY_TRACT | Status: AC
  Administered 2015-01-10: 3 mL via RESPIRATORY_TRACT

## 2015-01-10 MED ORDER — MORPHINE SULFATE 15 MG PO TABS
15.0000 mg | ORAL_TABLET | ORAL | Status: DC | PRN
  Administered 2015-01-10: 15 mg via ORAL
  Filled 2015-01-10: qty 1

## 2015-01-10 MED ORDER — PREDNISONE 20 MG PO TABS: 40.0000 mg | ORAL_TABLET | Freq: Every day | ORAL | Status: DC

## 2015-01-10 MED ORDER — PREDNISONE 20 MG PO TABS: ORAL_TABLET | ORAL | Status: DC

## 2015-01-10 MED ORDER — METHYLPREDNISOLONE SODIUM SUCC 125 MG IJ SOLR
125.0000 mg | Freq: Once | INTRAMUSCULAR | Status: AC
  Administered 2015-01-10: 125 mg via INTRAVENOUS
  Filled 2015-01-10: qty 2

## 2015-01-10 MED ORDER — MAGNESIUM SULFATE 2 GM/50ML IV SOLN
2.0000 g | Freq: Once | INTRAVENOUS | Status: AC
  Administered 2015-01-10: 2 g via INTRAVENOUS
  Filled 2015-01-10: qty 50

## 2015-01-10 MED ORDER — ALBUTEROL (5 MG/ML) CONTINUOUS INHALATION SOLN
10.0000 mg/h | INHALATION_SOLUTION | RESPIRATORY_TRACT | Status: AC
  Administered 2015-01-10: 10 mg/h via RESPIRATORY_TRACT
  Filled 2015-01-10: qty 20

## 2015-01-10 MED ORDER — LEVOFLOXACIN 750 MG PO TABS
750.0000 mg | ORAL_TABLET | Freq: Once | ORAL | Status: AC
  Administered 2015-01-10: 750 mg via ORAL
  Filled 2015-01-10: qty 1

## 2015-01-10 MED ORDER — LEVOFLOXACIN 750 MG PO TABS: 750.0000 mg | ORAL_TABLET | Freq: Every day | ORAL | Status: DC

## 2015-01-10 NOTE — ED Notes (Signed)
History of Duoneb, [place]. When returned home was short of breath, had a duoneb at home without relief. Able to speak in phrases upon EMS arrival. Second Duoneb enroute. Spo2 97% en route with neb.   Recently completed Zithromax, still taking oral prednisone.

## 2015-01-10 NOTE — Discharge Instructions (Signed)
°  You have declined admission to the hospital for your COPD. I hope you change you mind, you can return at any time.   Take your antibiotics as directed and to completion. You should never have any leftover antibiotics! Push fluids and stay well hydrated.   Please follow with your primary care doctor in the next 2 days for a check-up. They must obtain records for further management.   Do not hesitate to return to the Emergency Department for any new, worsening or concerning symptoms.

## 2015-01-10 NOTE — ED Notes (Signed)
Bed: WA02 Expected date:  Expected time:  Means of arrival:  Comments: COPD

## 2015-01-10 NOTE — ED Notes (Signed)
In radiology

## 2015-01-10 NOTE — ED Provider Notes (Signed)
CSN: [id number]     Arrival date & time 01/10/15  1605 History   First MD Initiated Contact with Patient 01/10/15 1706     Chief Complaint  Patient presents with  . Shortness of Breath     (Consider location/radiation/quality/duration/timing/severity/associated sxs/prior Treatment) HPI  [name] is a 64 y.o. male with past medical history significant for COPD, hyperlipidemia, hypertension, alcoholic hepatitis, peripheral arterial disease, ACS, complaining of acute onset of shortness of breath this afternoon. Patient states this is similar to prior COPD exacerbations. He's been having issues for the last 3 weeks. He was seen by his primary care doctor started on prednisone and a Z-Pak 2 weeks ago. He completed the course with mild improvement. He still taking steroids, no other antibiotics. He denies chest pain, described as tightness, chills, change in sputum, fever, diaphoresis, increasing peripheral edema, PND, orthopnea, abdominal pain. He is on 2 L of oxygen only overnight.  Past Medical History  Diagnosis Date  . Hyperlipidemia   . Hypertension   . COPD (chronic obstructive pulmonary disease)     Golds Stage II- Fev1 73% , FVC 49%, Rv 135%, DLCO 68%-03/2007  . Lumbar back pain   . AAA (abdominal aortic aneurysm)     5.0 X 4.8;  followed by vascular surgery  . Migraine headache   . History of hypokalemia     secondary to alcohol abuse January 2006  . Hypomagnesemia     secondary to alcohol abuse January 2006  . Hepatitis, alcoholic     January 9163  . Visual field cut     right with supranasal quadrantopia to retinal artery occlusion  . Left eye trauma     status post 15 years agoto the left eye  . PAD (peripheral artery disease)     with intermittent claudication, bilateral SFA occlusion  . Subclavian artery stenosis, left     left subclavian stenosis by ultrasound   . Myocardial infarction 11/12  . Coronary artery disease 11/12    LHC 08/04/11:normal LM. The proximal  LAD just beyond the ostium of this diagonal had a long 90% stenosis with TIMI 2 flow. The LAD was also collateralized by the RCA. No disease in the Lcx. 50% mid RCA stenosis. The RCA gives collaterals to the LAD. LV EF appeared normal, estimate 55%, no definite wall motion abnormalities.  PCI was attempted of his LAD but could not be crossed - med Tx  . Anemia   . Ischemic cardiomyopathy     echo 09/2011: EF 35-40%, akinesis of the distal LV, moderate LVH, grade 1 diastolic dysfunction, moderate LAE, PASP 34.  . Carotid stenosis     carotids 09/2011 bilateral 60-79% ICA stenosis  . Renal artery stenosis     Abdominal ultrasound 03/2010:4.7 x 5.0 cm AAA, critical right renal artery stenosis and greater than 60% left renal artery stenosis  . Pneumonia Oc. 2012   Past Surgical History  Procedure Laterality Date  . Tonsillectomy and adenoidectomy  1958  . Left heart catheterization with coronary angiogram N/A 08/11/2011    Procedure: LEFT HEART CATHETERIZATION WITH CORONARY ANGIOGRAM;  Surgeon: Larey Dresser, MD;  Location: [facility] CATH LAB;  Service: Cardiovascular;  Laterality: N/A;  . Percutaneous coronary stent intervention (pci-s) N/A 08/11/2011    Procedure: PERCUTANEOUS CORONARY STENT INTERVENTION (PCI-S);  Surgeon: Peter M Martinique, MD;  Location: [facility] CATH LAB;  Service: Cardiovascular;  Laterality: N/A;   Family History  Problem Relation Age of Onset  . Throat cancer Mother  died young age secondary to throat cancer  . Cancer Mother     Throat  . Hypertension Sister   . Diabetes Daughter   . Heart disease Father   . Hyperlipidemia Father   . Hypertension Father   . Heart attack Father    History  Substance Use Topics  . Smoking status: Former Smoker -- 1.00 packs/day for 54 years    Types: Cigarettes    Quit date: 06/11/2014  . Smokeless tobacco: Never Used  . Alcohol Use: No     Comment: fomer abuse quit 2006    Review of Systems  10 systems reviewed and found to be  negative, except as noted in the HPI.   Allergies  Vancomycin; Cephalexin; Moxifloxacin; and Penicillins  Home Medications   Prior to Admission medications   Medication Sig Start Date End Date Taking? Authorizing Provider  albuterol (PROVENTIL) (5 MG/ML) 0.5% nebulizer solution Take 0.5 mLs (2.5 mg total) by nebulization QID. Dx: 329 10/05/14  Yes Elsie Stain, MD  Albuterol Sulfate (PROAIR RESPICLICK) 518 (90 BASE) MCG/ACT AEPB Inhale 2 puffs into the lungs every 6 (six) hours as needed. 10/05/14  Yes Elsie Stain, MD  ALPRAZolam Duanne Moron) 0.25 MG tablet TAKE ONE TABLET BY MOUTH THREE TIMES DAILY AS NEEDED FOR ANXIETY 10/22/14  Yes Doe-Hyun Kyra Searles, DO  amitriptyline (ELAVIL) 25 MG tablet Take 1 tablet (25 mg total) by mouth at bedtime. 09/14/14  Yes Doe-Hyun Kyra Searles, DO  aspirin 325 MG tablet Take 325 mg by mouth daily.    Yes Historical Provider, MD  atorvastatin (LIPITOR) 80 MG tablet Take 1 tablet (80 mg total) by mouth daily. 02/20/14  Yes Lelon Perla, MD  b complex vitamins tablet Take 1 tablet by mouth daily.    Yes Historical Provider, MD  bisoprolol (ZEBETA) 5 MG tablet Take 1 tablet (5 mg total) by mouth daily. 09/14/14  Yes Doe-Hyun R Shawna Orleans, DO  budesonide-formoterol (SYMBICORT) 160-4.5 MCG/ACT inhaler Take 2 puffs first thing in am and then another 2 puffs about 12 hours later. 08/03/14  Yes Elsie Stain, MD  DALIRESP 500 MCG TABS tablet TAKE ONE TABLET BY MOUTH ONCE DAILY 11/09/14  Yes Elsie Stain, MD  Ferrous Sulfate (IRON) 325 (65 FE) MG TABS Take 1 tablet by mouth daily.   Yes Historical Provider, MD  fluticasone (FLONASE) 50 MCG/ACT nasal spray USE TWO SPRAY(S) IN EACH NOSTRIL ONCE DAILY 12/30/14  Yes Elsie Stain, MD  furosemide (LASIX) 20 MG tablet Take 1 tablet (20 mg total) by mouth daily. 08/14/14  Yes Lelon Perla, MD  hydrALAZINE (APRESOLINE) 10 MG tablet TAKE ONE TABLET BY MOUTH THREE TIMES DAILY 09/03/14  Yes Thompson Grayer, MD  HYDROcodone-homatropine  [facility]) 5-1.5 MG/5ML syrup Take 5 mLs by mouth every 6 (six) hours as needed for cough. 12/30/14  Yes Elsie Stain, MD  isosorbide dinitrate (ISORDIL) 30 MG tablet Take 0.5 tablets (15 mg total) by mouth daily. 06/29/14  Yes Eulas Post, MD  KRILL OIL 1000 MG CAPS Take 1 capsule by mouth daily.   Yes Historical Provider, MD  morphine (MSIR) 15 MG tablet Take 1 tablet (15 mg total) by mouth every 8 (eight) hours as needed. 11/27/14  Yes Doe-Hyun Kyra Searles, DO  multivitamin [facility]) per tablet Take 1 tablet by mouth daily.    Yes Historical Provider, MD  levofloxacin (LEVAQUIN) 750 MG tablet Take 1 tablet (750 mg total) by mouth daily. 01/10/15   Monico Blitz, PA-C  pantoprazole (PROTONIX) 40 MG tablet Take 1 tablet (40 mg total) by mouth daily. Patient not taking: Reported on 12/30/2014 11/27/14   Doe-Hyun R Shawna Orleans, DO  predniSONE (DELTASONE) 20 MG tablet Take 2 tablets (40 mg total) by mouth daily. 01/10/15   Valentin Benney, PA-C   BP 134/73 mmHg  Pulse 88  Temp(Src) 98.2 F (36.8 C) (Oral)  Resp 16  SpO2 92% Physical Exam  Constitutional: He is oriented to person, place, and time. He appears well-developed and well-nourished. No distress.  HENT:  Head: Normocephalic.  Mouth/Throat: Oropharynx is clear and moist.  Eyes: Conjunctivae and EOM are normal. Pupils are equal, round, and reactive to light.  Neck: Normal range of motion.  Cardiovascular: Normal rate, regular rhythm and intact distal pulses.   Pulmonary/Chest: Effort normal. No stridor. No respiratory distress. He has wheezes. He has no rales. He exhibits no tenderness.  Significant diffuse expiratory wheezing and severely prolonged exhalation. Patient is speaking in complete sentences, no tripoding.  Abdominal: Soft. Bowel sounds are normal. He exhibits no distension and no mass. There is no tenderness. There is no rebound and no guarding.  Musculoskeletal: Normal range of motion.  Neurological: He is alert and oriented to  person, place, and time.  Psychiatric: He has a normal mood and affect.  Nursing note and vitals reviewed.   ED Course  Procedures (including critical care time) Labs Review Labs Reviewed  BRAIN NATRIURETIC PEPTIDE - Abnormal; Notable for the following:    B Natriuretic Peptide 223.6 (*)    All other components within normal limits  CBC WITH DIFFERENTIAL/PLATELET - Abnormal; Notable for the following:    WBC 13.3 (*)    RDW 15.9 (*)    Neutrophils Relative % 86 (*)    Neutro Abs 11.4 (*)    Lymphocytes Relative 8 (*)    All other components within normal limits  COMPREHENSIVE METABOLIC PANEL - Abnormal; Notable for the following:    Glucose, Bld 142 (*)    BUN 33 (*)    GFR calc non Af Amer 58 (*)    GFR calc Af Amer 67 (*)    All other components within normal limits  Randolm Idol, ED    Imaging Review Dg Chest 2 View  01/10/2015   CLINICAL DATA:  Shortness of breath.  EXAM: CHEST  2 VIEW  COMPARISON:  None.  FINDINGS: The heart size and mediastinal contours are within normal limits. Both lungs are clear. No pneumothorax or pleural effusion is noted. Mild multilevel degenerative disc disease is noted in the lower thoracic spine.  IMPRESSION: No active cardiopulmonary disease.   Electronically Signed   By: Marijo Conception, M.D.   On: 01/10/2015 17:33     EKG Interpretation   Date/Time:  Sunday January 10 2015 16:14:01 EDT Ventricular Rate:  87 PR Interval:  143 QRS Duration: 108 QT Interval:  380 QTC Calculation: 457 R Axis:   -51 Text Interpretation:  Sinus rhythm Left anterior fascicular block Anterior  infarct, old agree. no STEMI. no sig change Confirmed by Johnney Killian, MD,  Jeannie Done [phone]) on 01/10/2015 8:38:14 PM      MDM   Final diagnoses:  COPD exacerbation    Filed Vitals:   01/10/15 1700 01/10/15 1730 01/10/15 1736 01/10/15 1837  BP:   134/73   Pulse: 90 87 88   Temp:      TempSrc:      Resp: 15 17 16    SpO2: 90% 95% 96% 92%  Medications   albuterol (PROVENTIL,VENTOLIN) solution continuous neb (0 mg/hr Nebulization Stopped 01/10/15 1938)  morphine (MSIR) tablet 15 mg (15 mg Oral Given 01/10/15 1848)  levofloxacin (LEVAQUIN) tablet 750 mg (not administered)  ipratropium-albuterol (DUONEB) 0.5-2.5 (3) MG/3ML nebulizer solution 3 mL (3 mLs Nebulization Given 01/10/15 1730)  methylPREDNISolone sodium succinate (SOLU-MEDROL) 125 mg/2 mL injection 125 mg (125 mg Intravenous Given 01/10/15 1730)  magnesium sulfate IVPB 2 g 50 mL (0 g Intravenous Stopped 01/10/15 1915)    [name] is a pleasant 64 y.o. male presenting with increasing shortness of breath, wheezing. She was seen at primary care and started on Z-Pak 2 weeks ago, he finished his course with no improvement. He still taking prednisone. On exam patient has significant expiratory wheezing with no acute respiratory distress. Denies chest pain or anginal symptoms. Does not appear to be fluid overloaded.  Patient has received Solu-Medrol, DuoNeb with little improvement. After hour-long nebulizer wheezing has improved but is still present. I think this patient needs admission however he is not amenable. Discussed case with attending, she is also tried to convince him to stay however he refuses. Will start him on a higher dose prednisone, advised him to resume his taper at the end of my prescription. He was he will follow with his pulmonologist. I'm writing her prescription for Avelox, is a history of allergy to moxifloxacin secondary to Achilles tendon rupture but he states that he's not sure if it's an allergy, states that he fell into a deep hole and dorsiflexed the foot violently thinks that he attended moderate ruptured even without the antibiotic. We've had an extensive discussion of return precautions, advised to have a very low threshold to return to the emergency room for any new, worsening or concerning symptoms. All questions encouraged and answered.  This is a shared visit with  the attending physician who personally evaluated the patient and agrees with the care plan.   Evaluation does not show pathology that would require ongoing emergent intervention or inpatient treatment. Pt is hemodynamically stable and mentating appropriately. Discussed findings and plan with patient/guardian, who agrees with care plan. All questions answered. Return precautions discussed and outpatient follow up given.   New Prescriptions   LEVOFLOXACIN (LEVAQUIN) 750 MG TABLET    Take 1 tablet (750 mg total) by mouth daily.   PREDNISONE (DELTASONE) 20 MG TABLET    Take 2 tablets (40 mg total) by mouth daily.         Monico Blitz, PA-C 01/10/15 8916  Charlesetta Shanks, MD 01/11/15 0002

## 2015-01-15 DIAGNOSIS — J449 Chronic obstructive pulmonary disease, unspecified: Secondary | ICD-10-CM | POA: Diagnosis not present

## 2015-01-25 ENCOUNTER — Ambulatory Visit: Payer: Commercial Managed Care - HMO | Admitting: Internal Medicine

## 2015-02-07 ENCOUNTER — Other Ambulatory Visit: Payer: Self-pay | Admitting: Internal Medicine

## 2015-02-10 ENCOUNTER — Ambulatory Visit: Payer: Commercial Managed Care - HMO | Admitting: Internal Medicine

## 2015-02-10 ENCOUNTER — Ambulatory Visit (INDEPENDENT_AMBULATORY_CARE_PROVIDER_SITE_OTHER): Payer: Commercial Managed Care - HMO | Admitting: Adult Health

## 2015-02-10 ENCOUNTER — Encounter: Payer: Self-pay | Admitting: Adult Health

## 2015-02-10 VITALS — BP 100/68 | Temp 98.7°F | Ht 67.0 in | Wt 159.0 lb

## 2015-02-10 DIAGNOSIS — I10 Essential (primary) hypertension: Secondary | ICD-10-CM | POA: Diagnosis not present

## 2015-02-10 DIAGNOSIS — Z760 Encounter for issue of repeat prescription: Secondary | ICD-10-CM | POA: Diagnosis not present

## 2015-02-10 DIAGNOSIS — J441 Chronic obstructive pulmonary disease with (acute) exacerbation: Secondary | ICD-10-CM | POA: Diagnosis not present

## 2015-02-10 DIAGNOSIS — Z09 Encounter for follow-up examination after completed treatment for conditions other than malignant neoplasm: Secondary | ICD-10-CM | POA: Diagnosis not present

## 2015-02-10 MED ORDER — MORPHINE SULFATE 15 MG PO TABS: 15.0000 mg | ORAL_TABLET | Freq: Three times a day (TID) | ORAL | Status: DC | PRN

## 2015-02-10 MED ORDER — HYDROCODONE-HOMATROPINE 5-1.5 MG/5ML PO SYRP: 5.0000 mL | ORAL_SOLUTION | Freq: Four times a day (QID) | ORAL | Status: DC | PRN

## 2015-02-10 MED ORDER — ALPRAZOLAM 0.25 MG PO TABS: 0.2500 mg | ORAL_TABLET | Freq: Three times a day (TID) | ORAL | Status: DC | PRN

## 2015-02-10 NOTE — Patient Instructions (Signed)
I am glad you are feeling better. Continue with your current regimen. Follow up with Dr. Shawna Orleans in two months. If you need anything in the mean time, please let us know.

## 2015-02-10 NOTE — Assessment & Plan Note (Signed)
Continue with current regimen. Feels better since leaving hospital. Follow up in two months.

## 2015-02-10 NOTE — Assessment & Plan Note (Signed)
Current blood pressure in office is BP: 100/68 mmHg . Well controlled. Stay on current medication regimen.

## 2015-02-10 NOTE — Progress Notes (Signed)
   Subjective:    Patient ID: [name], male    DOB: [birth year], 64 y.o.   MRN: [id number]  HPI  64 year old white male with COPD/bronchitis, hypertension, hyperlipidemia, peripheral vascular disease and chronic low back pain for follow-up.  Interval medical history-patient seen in the ER on 01/10/2015 for acute onset of dyspnea, in which he was treated with Solu-Medrol, DuoNeb, was given a prescription for Avelox and prednisone taper.    Patient reports is feeling better with last course of ABX and prednisone he was given in the ER   Denies any COPD exacerbations since being in the ER.  He has smoked a cigarette, prior to his ER visit. Has not had any since.    Patient reports he hasn't had a cigarette since November 2015.  Has not followed up with vascular yet about his AAA - plans on doing so after paying some bills.    Chronic low back pain-stable  Review of Systems  Constitutional: Negative for fever, activity change, appetite change and unexpected weight change.  Respiratory: Positive for cough, chest tightness, shortness of breath and wheezing. Negative for apnea, choking and stridor.   Cardiovascular: Negative for chest pain, palpitations and leg swelling.  Musculoskeletal: Positive for back pain and arthralgias. Negative for joint swelling and gait problem.  All other systems reviewed and are negative.      Objective:   Physical Exam  Constitutional: He is oriented to person, place, and time. He appears well-developed and well-nourished. No distress.  Eyes: Right eye exhibits no discharge. Left eye exhibits no discharge.  Cardiovascular: Normal rate, regular rhythm, normal heart sounds and intact distal pulses.  Exam reveals no gallop and no friction rub.   No murmur heard. Pulmonary/Chest: Effort normal. No respiratory distress. He has wheezes. He has no rales. He exhibits no tenderness.  Prolonged expiration. Wheezing in left lower lobe with expiration.     Neurological: He is alert and oriented to person, place, and time.  Skin: Skin is warm and dry. He is not diaphoretic.  Psychiatric: He has a normal mood and affect. His behavior is normal. Judgment and thought content normal.  Vitals reviewed.      Assessment & Plan:  1. Follow-up exam - Follow up in two months - Follow up sooner if needed.   2. Medication refill - HYDROcodone-homatropine (HYCODAN) 5-1.5 MG/5ML syrup; Take 5 mLs by mouth every 6 (six) hours as needed for cough.  Dispense: 240 mL; Refill: 0 - morphine (MSIR) 15 MG tablet; Take 1 tablet (15 mg total) by mouth every 8 (eight) hours as needed. DO NOT FILL TILL 03/13/2015  Dispense: 90 tablet; Refill: 0 - HYDROcodone-homatropine (HYCODAN) 5-1.5 MG/5ML syrup; Take 5 mLs by mouth every 6 (six) hours as needed for cough. DO NOT FILL TILL 03/13/2015  Dispense: 240 mL; Refill: 0 - ALPRAZolam (XANAX) 0.25 MG tablet; Take 1 tablet (0.25 mg total) by mouth 3 (three) times daily as needed. for anxiety  DO NOT FILL TILL 03/13/2015  Dispense: 90 tablet; Refill: 0

## 2015-02-14 DIAGNOSIS — J449 Chronic obstructive pulmonary disease, unspecified: Secondary | ICD-10-CM | POA: Diagnosis not present

## 2015-02-24 ENCOUNTER — Encounter: Payer: Self-pay | Admitting: Critical Care Medicine

## 2015-02-24 ENCOUNTER — Ambulatory Visit (INDEPENDENT_AMBULATORY_CARE_PROVIDER_SITE_OTHER): Payer: Commercial Managed Care - HMO | Admitting: Critical Care Medicine

## 2015-02-24 VITALS — BP 106/70 | HR 72 | Ht 67.0 in | Wt 159.0 lb

## 2015-02-24 DIAGNOSIS — J449 Chronic obstructive pulmonary disease, unspecified: Secondary | ICD-10-CM | POA: Diagnosis not present

## 2015-02-24 MED ORDER — ROFLUMILAST 500 MCG PO TABS: 500.0000 ug | ORAL_TABLET | Freq: Every day | ORAL | Status: DC

## 2015-02-24 NOTE — Patient Instructions (Signed)
We will enroll you in the Copd Gold program No change in medications Patient assistance for Daliresp and Symbicort Return 3 months

## 2015-02-24 NOTE — Progress Notes (Signed)
Subjective:    Patient ID: [name], male    DOB: Jul 18, 1951, 64 y.o.   MRN: [id number]  HPI 02/24/2015 Chief Complaint  Patient presents with  . Follow-up    PT was seen 01/05/15 dyspnea and then had ED visit on 01/10/15 COPD exacerbation. He still has sob, cough with white mucus, wheezing and  chest tightness. Pt uses 2L 02 at bedtime and uses nebulizer 3-4 times daily.    Pt had a flare of copd 01/2015 and went to the ED. Now notes some mucus: white, notes some wheeze.   Pt denies any significant sore throat, nasal congestion or excess secretions, fever, chills, sweats, unintended weight loss, pleurtic or exertional chest pain, orthopnea PND, or leg swelling Pt denies any increase in rescue therapy over baseline, denies waking up needing it or having any early am or nocturnal exacerbations of coughing/wheezing/or dyspnea. Pt also denies any obvious fluctuation in symptoms with  weather or environmental change or other alleviating or aggravating factors  Current Medications, Allergies, Complete Past Medical History, Past Surgical History, Family History, and Social History were reviewed in [facility] record per todays encounter:  02/24/2015  Review of Systems Constitutional:   No  weight loss, night sweats,  Fevers, chills, fatigue, lassitude. HEENT:   No headaches,  Difficulty swallowing,  Tooth/dental problems,  Sore throat,                No sneezing, itching, ear ache, nasal congestion, post nasal drip,   CV:  No chest pain,  Orthopnea, PND, swelling in lower extremities, anasarca, dizziness, palpitations  GI  No heartburn, indigestion, abdominal pain, nausea, vomiting, diarrhea, change in bowel habits, loss of appetite  Resp:   No coughing up of blood.  No change in color of mucus.  No wheezing.  No chest wall deformity  Skin: no rash or lesions.  GU: no dysuria, change in color of urine, no urgency or frequency.  No flank pain.  MS:  No joint pain  or swelling.  No decreased range of motion.  No back pain.  Psych:  No change in mood or affect. No depression or anxiety.  No memory loss.     Objective:   Physical Exam Filed Vitals:   02/24/15 1114  BP: 106/70  Pulse: 72  Height: 5\' 7"  (1.702 m)  Weight: 159 lb (72.122 kg)  SpO2: 93%    Gen: Pleasant, well-nourished, in no distress,  normal affect  ENT: No lesions,  mouth clear,  oropharynx clear, no postnasal drip  Neck: No JVD, no TMG, no carotid bruits  Lungs: No use of accessory muscles, no dullness to percussion,distant BS  Cardiovascular: RRR, heart sounds normal, no murmur or gallops, no peripheral edema  Abdomen: soft and NT, no HSM,  BS normal  Musculoskeletal: No deformities, no cyanosis or clubbing  Neuro: alert, non focal  Skin: Warm, no lesions or rashes  No results found.        Assessment & Plan:  I personally reviewed all images and lab data in the [facility] system as well as any outside material available during this office visit and agree with the  radiology impressions.   COPD with chronic bronchitis Gold D copd freq exac now improved Very unstable, fragile and high risk No active smoking  Plan Enroll gold copd program No other changes Cont inhaled meds    [name] was seen today for follow-up.  Diagnoses and all orders for this visit:  COPD  with chronic bronchitis Orders: -     COPD GOLD ALERT  Other orders -     roflumilast (DALIRESP) 500 MCG TABS tablet; Take 1 tablet (500 mcg total) by mouth daily.

## 2015-02-24 NOTE — Assessment & Plan Note (Signed)
Gold D copd freq exac now improved Very unstable, fragile and high risk No active smoking  Plan Enroll gold copd program No other changes Cont inhaled meds

## 2015-03-04 ENCOUNTER — Other Ambulatory Visit: Payer: Self-pay | Admitting: Cardiology

## 2015-03-15 ENCOUNTER — Telehealth: Payer: Self-pay | Admitting: Pulmonary Disease

## 2015-03-15 MED ORDER — BUDESONIDE-FORMOTEROL FUMARATE 160-4.5 MCG/ACT IN AERO: INHALATION_SPRAY | RESPIRATORY_TRACT | Status: DC

## 2015-03-15 NOTE — Telephone Encounter (Signed)
lmtcb x1 w/ family member  

## 2015-03-15 NOTE — Telephone Encounter (Signed)
Patient given 2 boxes. Nothing further needed.

## 2015-03-16 ENCOUNTER — Telehealth: Payer: Self-pay | Admitting: Critical Care Medicine

## 2015-03-16 NOTE — Telephone Encounter (Signed)
Pt left pt assistance application and financial documents to be completed for Symbicort 160 and Daliresp 500 and faxed to [employer] Patient Assistance Program.  Forms completed.  Signed by PW.  It has been faxed to [phone].  Original mailed to pt's verified home address.  Pt aware and voiced no further questions or concerns at this time.

## 2015-03-17 DIAGNOSIS — J449 Chronic obstructive pulmonary disease, unspecified: Secondary | ICD-10-CM | POA: Diagnosis not present

## 2015-04-14 ENCOUNTER — Ambulatory Visit (INDEPENDENT_AMBULATORY_CARE_PROVIDER_SITE_OTHER): Payer: Commercial Managed Care - HMO | Admitting: Internal Medicine

## 2015-04-14 ENCOUNTER — Encounter: Payer: Self-pay | Admitting: Internal Medicine

## 2015-04-14 VITALS — BP 110/72 | Temp 98.5°F | Ht 67.0 in | Wt 158.6 lb

## 2015-04-14 DIAGNOSIS — M545 Low back pain, unspecified: Secondary | ICD-10-CM

## 2015-04-14 DIAGNOSIS — I519 Heart disease, unspecified: Secondary | ICD-10-CM

## 2015-04-14 DIAGNOSIS — Z09 Encounter for follow-up examination after completed treatment for conditions other than malignant neoplasm: Secondary | ICD-10-CM | POA: Diagnosis not present

## 2015-04-14 DIAGNOSIS — I10 Essential (primary) hypertension: Secondary | ICD-10-CM | POA: Diagnosis not present

## 2015-04-14 DIAGNOSIS — J441 Chronic obstructive pulmonary disease with (acute) exacerbation: Secondary | ICD-10-CM

## 2015-04-14 MED ORDER — MORPHINE SULFATE 15 MG PO TABS: 15.0000 mg | ORAL_TABLET | Freq: Three times a day (TID) | ORAL | Status: DC

## 2015-04-14 MED ORDER — HYDRALAZINE HCL 10 MG PO TABS: 10.0000 mg | ORAL_TABLET | Freq: Three times a day (TID) | ORAL | Status: DC

## 2015-04-14 MED ORDER — BISOPROLOL FUMARATE 5 MG PO TABS: 5.0000 mg | ORAL_TABLET | Freq: Every day | ORAL | Status: DC

## 2015-04-14 MED ORDER — ALPRAZOLAM 0.25 MG PO TABS
0.2500 mg | ORAL_TABLET | Freq: Three times a day (TID) | ORAL | Status: DC | PRN
Start: 2015-04-14 — End: 2015-04-14

## 2015-04-14 MED ORDER — BENZONATATE 200 MG PO CAPS: 200.0000 mg | ORAL_CAPSULE | Freq: Three times a day (TID) | ORAL | Status: DC | PRN

## 2015-04-14 MED ORDER — AMITRIPTYLINE HCL 25 MG PO TABS: 25.0000 mg | ORAL_TABLET | Freq: Every day | ORAL | Status: DC

## 2015-04-14 MED ORDER — ISOSORBIDE DINITRATE 30 MG PO TABS: 15.0000 mg | ORAL_TABLET | Freq: Every day | ORAL | Status: DC

## 2015-04-14 MED ORDER — PREDNISONE 20 MG PO TABS: ORAL_TABLET | ORAL | Status: DC

## 2015-04-14 MED ORDER — PREDNISONE 20 MG PO TABS
ORAL_TABLET | ORAL | Status: DC
Start: 2015-04-14 — End: 2015-06-29

## 2015-04-14 MED ORDER — FUROSEMIDE 20 MG PO TABS: 20.0000 mg | ORAL_TABLET | Freq: Every day | ORAL | Status: DC

## 2015-04-14 MED ORDER — ALPRAZOLAM 0.25 MG PO TABS: 0.2500 mg | ORAL_TABLET | Freq: Three times a day (TID) | ORAL | Status: DC | PRN

## 2015-04-14 NOTE — Assessment & Plan Note (Signed)
Unchanged.  Rx for 2 months of MSIR provided.

## 2015-04-14 NOTE — Progress Notes (Signed)
Pre visit review using our clinic review tool, if applicable. No additional management support is needed unless otherwise documented below in the visit note. 
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
## 2015-04-14 NOTE — Assessment & Plan Note (Addendum)
Patient complains of chronic cough and has diffuse expiratory wheeze.  Treat with prednisone taper.  Continue maintenance inhalers and Daliresp.  Reassess in 2 months.  Patient advised to call office if symptoms persist or worsen.  Use tessalon perles for cough.  DC Hycodan.  He is exposed to second hand smoke.  His daughter smokes.  He understands importance of avoiding second hand smoke.

## 2015-04-14 NOTE — Assessment & Plan Note (Signed)
Well controlled.  BP medication refilled. BP: 110/72 mmHg

## 2015-04-16 DIAGNOSIS — J449 Chronic obstructive pulmonary disease, unspecified: Secondary | ICD-10-CM | POA: Diagnosis not present

## 2015-05-14 ENCOUNTER — Ambulatory Visit: Payer: Commercial Managed Care - HMO | Admitting: Internal Medicine

## 2015-05-17 DIAGNOSIS — J449 Chronic obstructive pulmonary disease, unspecified: Secondary | ICD-10-CM | POA: Diagnosis not present

## 2015-05-20 ENCOUNTER — Other Ambulatory Visit: Payer: Self-pay | Admitting: Emergency Medicine

## 2015-05-20 DIAGNOSIS — J441 Chronic obstructive pulmonary disease with (acute) exacerbation: Secondary | ICD-10-CM

## 2015-05-20 MED ORDER — IPRATROPIUM BROMIDE 0.02 % IN SOLN: 0.5000 mg | Freq: Three times a day (TID) | RESPIRATORY_TRACT | Status: DC
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
## 2015-05-28 ENCOUNTER — Encounter: Payer: Self-pay | Admitting: Cardiology

## 2015-05-28 ENCOUNTER — Ambulatory Visit (INDEPENDENT_AMBULATORY_CARE_PROVIDER_SITE_OTHER): Payer: Commercial Managed Care - HMO | Admitting: Cardiology

## 2015-05-28 VITALS — BP 124/64 | HR 74 | Ht 67.0 in | Wt 165.7 lb

## 2015-05-28 DIAGNOSIS — I701 Atherosclerosis of renal artery: Secondary | ICD-10-CM | POA: Diagnosis not present

## 2015-05-28 DIAGNOSIS — I10 Essential (primary) hypertension: Secondary | ICD-10-CM | POA: Diagnosis not present

## 2015-05-28 DIAGNOSIS — I714 Abdominal aortic aneurysm, without rupture, unspecified: Secondary | ICD-10-CM

## 2015-05-28 DIAGNOSIS — I2510 Atherosclerotic heart disease of native coronary artery without angina pectoris: Secondary | ICD-10-CM

## 2015-05-28 DIAGNOSIS — I679 Cerebrovascular disease, unspecified: Secondary | ICD-10-CM

## 2015-05-28 DIAGNOSIS — I712 Thoracic aortic aneurysm, without rupture, unspecified: Secondary | ICD-10-CM

## 2015-05-28 DIAGNOSIS — R911 Solitary pulmonary nodule: Secondary | ICD-10-CM

## 2015-05-28 DIAGNOSIS — I255 Ischemic cardiomyopathy: Secondary | ICD-10-CM

## 2015-05-28 DIAGNOSIS — I2583 Coronary atherosclerosis due to lipid rich plaque: Secondary | ICD-10-CM

## 2015-05-28 NOTE — Assessment & Plan Note (Signed)
I have again asked him to schedule a follow-up appointment at [facility]. I explained that his aneurysm likely will need repair. I also explained that not following up could lead to rupture which could lead to death. We will contact him again next week to make sure that he has scheduled an appointment.

## 2015-05-28 NOTE — Assessment & Plan Note (Signed)
Continue aspirin and statin. I have asked him to follow-up with vascular surgery at [facility] is following his carotid disease.

## 2015-05-28 NOTE — Patient Instructions (Signed)
Your physician wants you to follow-up in: 6 MONTHS WITH DR CRENSHAW You will receive a reminder letter in the mail two months in advance. If you don't receive a letter, please call our office to schedule the follow-up appointment.  

## 2015-05-28 NOTE — Assessment & Plan Note (Signed)
Follow-up chest CT November 2016.

## 2015-05-28 NOTE — Assessment & Plan Note (Signed)
Continue beta blocker, hydralazine and nitrates. He had acute renal failure with ARB previously and known renal artery stenosis. We will therefore avoid ACE inhibitors and ARB's.

## 2015-05-28 NOTE — Assessment & Plan Note (Signed)
Continue aspirin and statin. 

## 2015-05-28 NOTE — Assessment & Plan Note (Signed)
Continue statin. 

## 2015-05-28 NOTE — Assessment & Plan Note (Signed)
Blood pressure controlled. Continue present medications. 

## 2015-06-04 ENCOUNTER — Other Ambulatory Visit: Payer: Self-pay | Admitting: Cardiology

## 2015-06-09 ENCOUNTER — Encounter: Payer: Self-pay | Admitting: Critical Care Medicine

## 2015-06-09 ENCOUNTER — Telehealth: Payer: Self-pay

## 2015-06-09 ENCOUNTER — Ambulatory Visit (INDEPENDENT_AMBULATORY_CARE_PROVIDER_SITE_OTHER): Payer: Commercial Managed Care - HMO | Admitting: Critical Care Medicine

## 2015-06-09 VITALS — BP 108/72 | HR 85 | Temp 98.0°F | Ht 67.0 in | Wt 162.0 lb

## 2015-06-09 DIAGNOSIS — J441 Chronic obstructive pulmonary disease with (acute) exacerbation: Secondary | ICD-10-CM

## 2015-06-09 DIAGNOSIS — Z23 Encounter for immunization: Secondary | ICD-10-CM | POA: Diagnosis not present

## 2015-06-09 DIAGNOSIS — Z09 Encounter for follow-up examination after completed treatment for conditions other than malignant neoplasm: Secondary | ICD-10-CM

## 2015-06-09 NOTE — Patient Instructions (Signed)
No change in medications. Finish prednisone  Return in          4 months Flu vaccine today

## 2015-06-09 NOTE — Telephone Encounter (Signed)
The pt called and is hoping to have his morphine rx and xanax refilled  Pt callback - [phone]

## 2015-06-09 NOTE — Progress Notes (Signed)
Subjective:    Patient ID: [name], male    DOB: [birth year], 64 y.o.   MRN: [id number]  HPI 06/09/2015 Chief Complaint  Patient presents with  . 3 month follow up    Pt here for 3 month f/u for COPD. Pt states his breathing is unchanged since last OV. Pt states his breathing worsens with heat and humidity. Pt c/o mild prod cough with clear to white mucus. Pt denies CP/tightness. Pt is now enrolled in pt assistance with symbicort and daliresp.   F/u copd , mild cough clear white.  Notes some wheeze. Pt getting assistance with meds.  No smoking    Pt denies any significant sore throat, nasal congestion or excess secretions, fever, chills, sweats, unintended weight loss, pleurtic or exertional chest pain, orthopnea PND, or leg swelling Pt denies any increase in rescue therapy over baseline, denies waking up needing it or having any early am or nocturnal exacerbations of wheezing/or dyspnea.  Pt will awaken with cough at night  Pt also denies any obvious fluctuation in symptoms with  weather or environmental change or other alleviating or aggravating factors   Current Medications, Allergies, Complete Past Medical History, Past Surgical History, Family History, and Social History were reviewed in [facility] record per todays encounter:  06/09/2015     Review of Systems  Constitutional: Negative.   HENT: Negative.  Negative for ear pain, postnasal drip, rhinorrhea, sinus pressure, sore throat, trouble swallowing and voice change.   Eyes: Negative.   Respiratory: Positive for cough, shortness of breath and wheezing. Negative for apnea, choking, chest tightness and stridor.   Cardiovascular: Negative.  Negative for chest pain, palpitations and leg swelling.  Gastrointestinal: Negative.  Negative for nausea, vomiting, abdominal pain and abdominal distention.  Genitourinary: Negative.   Musculoskeletal: Negative.  Negative for myalgias and arthralgias.  Skin:  Negative.  Negative for rash.  Allergic/Immunologic: Negative.  Negative for environmental allergies and food allergies.  Neurological: Negative.  Negative for dizziness, syncope, weakness and headaches.  Hematological: Negative.  Negative for adenopathy. Does not bruise/bleed easily.  Psychiatric/Behavioral: Negative.  Negative for sleep disturbance and agitation. The patient is not nervous/anxious.        Objective:   Physical Exam Filed Vitals:   06/09/15 1049  BP: 108/72  Pulse: 85  Temp: 98 F (36.7 C)  TempSrc: Oral  Height: 5\' 7"  (1.702 m)  Weight: 162 lb (73.483 kg)  SpO2: 95%    Gen: Pleasant, well-nourished, in no distress,  normal affect  ENT: No lesions,  mouth clear,  oropharynx clear, no postnasal drip  Neck: No JVD, no TMG, no carotid bruits  Lungs: No use of accessory muscles, no dullness to percussion, scattered rhonchi. Expired wheezes  Cardiovascular: RRR, heart sounds normal, no murmur or gallops, no peripheral edema  Abdomen: soft and NT, no HSM,  BS normal  Musculoskeletal: No deformities, no cyanosis or clubbing  Neuro: alert, non focal  Skin: Warm, no lesions or rashes  No results found.        Assessment & Plan:  I personally reviewed all images and lab data in the [facility] system as well as any outside material available during this office visit and agree with the  radiology impressions.   COPD exacerbation Chronic obstructive lung disease with recurrent exacerbation Under current treatment now with corticosteroid's with some improvement Ex-smoker Plan Finish current course of steroid's Administer flu vaccine Continued inhaled medications including Symbicort Continue Daliresp Return 4 months  Harland was seen today for 3 month follow up.  Diagnoses and all orders for this visit:  Encounter for immunization  COPD exacerbation  Other orders -     Flu Vaccine QUAD 36+ mos IM

## 2015-06-10 MED ORDER — ALPRAZOLAM 0.25 MG PO TABS: 0.2500 mg | ORAL_TABLET | Freq: Three times a day (TID) | ORAL | Status: DC | PRN

## 2015-06-10 MED ORDER — MORPHINE SULFATE 15 MG PO TABS: 15.0000 mg | ORAL_TABLET | Freq: Three times a day (TID) | ORAL | Status: DC

## 2015-06-10 NOTE — Telephone Encounter (Signed)
Ok to RF both xanax and morphine.  Can you ask Dr. Raliegh Ip to sign

## 2015-06-10 NOTE — Telephone Encounter (Signed)
Pt following up on refill request. This was routed incorrectly

## 2015-06-10 NOTE — Assessment & Plan Note (Addendum)
Chronic obstructive lung disease with recurrent exacerbation Under current treatment now with corticosteroid's with some improvement Ex-smoker Plan Finish current course of steroid's Administer flu vaccine Continued inhaled medications including Symbicort Continue Daliresp Return 4 months

## 2015-06-10 NOTE — Telephone Encounter (Signed)
Dr. Raliegh Ip signed Rx. Pt is aware that Rx's are ready for pickup

## 2015-06-14 ENCOUNTER — Ambulatory Visit: Payer: Commercial Managed Care - HMO | Admitting: Internal Medicine

## 2015-06-17 DIAGNOSIS — J449 Chronic obstructive pulmonary disease, unspecified: Secondary | ICD-10-CM | POA: Diagnosis not present

## 2015-06-22 ENCOUNTER — Other Ambulatory Visit: Payer: Self-pay | Admitting: Critical Care Medicine

## 2015-06-25 ENCOUNTER — Ambulatory Visit: Payer: Commercial Managed Care - HMO | Admitting: Internal Medicine

## 2015-06-29 ENCOUNTER — Encounter: Payer: Self-pay | Admitting: Adult Health

## 2015-06-29 ENCOUNTER — Ambulatory Visit (INDEPENDENT_AMBULATORY_CARE_PROVIDER_SITE_OTHER): Payer: Commercial Managed Care - HMO | Admitting: Adult Health

## 2015-06-29 VITALS — BP 102/68 | HR 69 | Temp 98.9°F | Resp 20 | Ht 67.0 in | Wt 167.7 lb

## 2015-06-29 DIAGNOSIS — F419 Anxiety disorder, unspecified: Secondary | ICD-10-CM

## 2015-06-29 DIAGNOSIS — Z09 Encounter for follow-up examination after completed treatment for conditions other than malignant neoplasm: Secondary | ICD-10-CM

## 2015-06-29 DIAGNOSIS — M545 Low back pain: Secondary | ICD-10-CM

## 2015-06-29 DIAGNOSIS — G8929 Other chronic pain: Secondary | ICD-10-CM

## 2015-06-29 DIAGNOSIS — J449 Chronic obstructive pulmonary disease, unspecified: Secondary | ICD-10-CM

## 2015-06-29 DIAGNOSIS — L989 Disorder of the skin and subcutaneous tissue, unspecified: Secondary | ICD-10-CM | POA: Diagnosis not present

## 2015-06-29 MED ORDER — MORPHINE SULFATE 15 MG PO TABS
15.0000 mg | ORAL_TABLET | Freq: Three times a day (TID) | ORAL | Status: DC
Start: 2015-06-29 — End: 2015-08-04

## 2015-06-29 MED ORDER — ALPRAZOLAM 0.25 MG PO TABS: 0.2500 mg | ORAL_TABLET | Freq: Three times a day (TID) | ORAL | Status: DC | PRN

## 2015-06-29 NOTE — Patient Instructions (Addendum)
  It was great seeing you again!  Please take the Xanax and Morphine as prescribed.   Follow up with [facility] at the number below  [address], [place], [place], [place] [address] [phone]

## 2015-06-29 NOTE — Progress Notes (Signed)
Pre visit review using our clinic review tool, if applicable. No additional management support is needed unless otherwise documented below in the visit note. 
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
## 2015-07-14 ENCOUNTER — Other Ambulatory Visit (INDEPENDENT_AMBULATORY_CARE_PROVIDER_SITE_OTHER): Payer: Commercial Managed Care - HMO

## 2015-07-14 ENCOUNTER — Ambulatory Visit (INDEPENDENT_AMBULATORY_CARE_PROVIDER_SITE_OTHER): Payer: Commercial Managed Care - HMO | Admitting: Pulmonary Disease

## 2015-07-14 ENCOUNTER — Encounter: Payer: Self-pay | Admitting: Pulmonary Disease

## 2015-07-14 ENCOUNTER — Ambulatory Visit (INDEPENDENT_AMBULATORY_CARE_PROVIDER_SITE_OTHER)
Admission: RE | Admit: 2015-07-14 | Discharge: 2015-07-14 | Disposition: A | Discharge status: Home / Self Care | Payer: Commercial Managed Care - HMO | Source: Ambulatory Visit | Attending: Pulmonary Disease | Admitting: Pulmonary Disease

## 2015-07-14 VITALS — BP 114/62 | HR 87 | Ht 67.0 in | Wt 170.2 lb

## 2015-07-14 DIAGNOSIS — R059 Cough, unspecified: Secondary | ICD-10-CM

## 2015-07-14 DIAGNOSIS — R05 Cough: Secondary | ICD-10-CM

## 2015-07-14 DIAGNOSIS — J449 Chronic obstructive pulmonary disease, unspecified: Secondary | ICD-10-CM

## 2015-07-14 DIAGNOSIS — R0602 Shortness of breath: Secondary | ICD-10-CM | POA: Diagnosis not present

## 2015-07-14 LAB — CBC WITH DIFFERENTIAL/PLATELET
BASOS ABS: 0 10*3/uL (ref 0.0–0.1)
Basophils Relative: 0.3 % (ref 0.0–3.0)
EOS ABS: 0.1 10*3/uL (ref 0.0–0.7)
Eosinophils Relative: 1.4 % (ref 0.0–5.0)
HCT: 37.8 % — ABNORMAL LOW (ref 39.0–52.0)
HEMOGLOBIN: 12.5 g/dL — AB (ref 13.0–17.0)
LYMPHS PCT: 11.9 % — AB (ref 12.0–46.0)
Lymphs Abs: 1.2 10*3/uL (ref 0.7–4.0)
MCHC: 33.1 g/dL (ref 30.0–36.0)
MCV: 94.3 fl (ref 78.0–100.0)
MONO ABS: 0.6 10*3/uL (ref 0.1–1.0)
Monocytes Relative: 6.3 % (ref 3.0–12.0)
Neutro Abs: 7.8 10*3/uL — ABNORMAL HIGH (ref 1.4–7.7)
Neutrophils Relative %: 80.1 % — ABNORMAL HIGH (ref 43.0–77.0)
Platelets: 175 10*3/uL (ref 150.0–400.0)
RBC: 4 Mil/uL — AB (ref 4.22–5.81)
RDW: 14.4 % (ref 11.5–15.5)
WBC: 9.7 10*3/uL (ref 4.0–10.5)

## 2015-07-14 MED ORDER — AEROCHAMBER Z-STAT PLUS CHAMBR MISC
Status: DC
Start: 2015-07-14 — End: 2016-03-23

## 2015-07-14 MED ORDER — PREDNISONE 20 MG PO TABS: 40.0000 mg | ORAL_TABLET | Freq: Every day | ORAL | Status: DC

## 2015-07-14 MED ORDER — HYDROCOD POLST-CPM POLST ER 10-8 MG/5ML PO SUER: 5.0000 mL | Freq: Every evening | ORAL | Status: DC | PRN

## 2015-07-14 MED ORDER — TIOTROPIUM BROMIDE MONOHYDRATE 2.5 MCG/ACT IN AERS: INHALATION_SPRAY | RESPIRATORY_TRACT | Status: DC
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
## 2015-07-14 NOTE — Patient Instructions (Signed)
1. Remember to use your spacer with Symbicort as prescribed. 2. I'm starting you on Spiriva to help with her breathing as well. 3. You can continue using her albuterol & Atrovent in her nebulizer every 4 hours as needed. 4. I'm prescribing you Tussionex cough syrup to use  at night as needed. use this cautiously as it could sedate you in it with the hydrocodone contained in it. 5. Please call me if her breathing worsens before your next appointment as we may have to go with a higher dose of prednisone. 6. I will see you back in 1-2 weeks.

## 2015-07-15 ENCOUNTER — Other Ambulatory Visit: Payer: Self-pay | Admitting: Pulmonary Disease

## 2015-07-15 ENCOUNTER — Other Ambulatory Visit: Payer: Commercial Managed Care - HMO

## 2015-07-15 DIAGNOSIS — R059 Cough, unspecified: Secondary | ICD-10-CM

## 2015-07-15 DIAGNOSIS — R05 Cough: Secondary | ICD-10-CM | POA: Diagnosis not present

## 2015-07-15 DIAGNOSIS — J449 Chronic obstructive pulmonary disease, unspecified: Secondary | ICD-10-CM | POA: Diagnosis not present

## 2015-07-15 LAB — IGE: IgE (Immunoglobulin E), Serum: 17 kU/L (ref <115)

## 2015-07-17 DIAGNOSIS — J449 Chronic obstructive pulmonary disease, unspecified: Secondary | ICD-10-CM | POA: Diagnosis not present

## 2015-07-18 LAB — RESPIRATORY CULTURE OR RESPIRATORY AND SPUTUM CULTURE

## 2015-07-19 LAB — RESPIRATORY VIRUS PANEL
ADENOVIRUS B: NOT DETECTED
INFLUENZA A H1: NOT DETECTED
Influenza A H3: NOT DETECTED
Influenza A: NOT DETECTED
Influenza B: NOT DETECTED
METAPNEUMOVIRUS: NOT DETECTED
PARAINFLUENZA 1 A: NOT DETECTED
PARAINFLUENZA 2 A: NOT DETECTED
PARAINFLUENZA 3 A: NOT DETECTED
RHINOVIRUS: NOT DETECTED
Respiratory Syncytial Virus A: NOT DETECTED
Respiratory Syncytial Virus B: NOT DETECTED

## 2015-07-23 ENCOUNTER — Ambulatory Visit (INDEPENDENT_AMBULATORY_CARE_PROVIDER_SITE_OTHER): Payer: Commercial Managed Care - HMO | Admitting: Pulmonary Disease

## 2015-07-23 ENCOUNTER — Encounter: Payer: Self-pay | Admitting: Pulmonary Disease

## 2015-07-23 VITALS — BP 108/62 | HR 83 | Ht 67.0 in | Wt 167.0 lb

## 2015-07-23 DIAGNOSIS — J441 Chronic obstructive pulmonary disease with (acute) exacerbation: Secondary | ICD-10-CM | POA: Diagnosis not present

## 2015-07-23 MED ORDER — ALBUTEROL SULFATE (5 MG/ML) 0.5% IN NEBU: 2.5000 mg | INHALATION_SOLUTION | RESPIRATORY_TRACT | Status: DC | PRN

## 2015-07-23 MED ORDER — IPRATROPIUM BROMIDE 0.02 % IN SOLN: 0.5000 mg | RESPIRATORY_TRACT | Status: DC | PRN
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
## 2015-07-23 NOTE — Patient Instructions (Signed)
1. Continue using her Daliresp, Symbicort, & Spiriva as prescribed. 2. Do not forget to contact the Spiriva drug company to see if there is prescription assistance available. 3. Continue using your albuterol & Atrovent (ipratropium bromide) mixed together every 4 hours as needed. 4. Please contact me if you're sputum color or volume changes or increases or you develop any worsening of breathing before your next appointment. 5. I will see you back in 3 months or sooner if needed.

## 2015-07-29 ENCOUNTER — Telehealth: Payer: Self-pay | Admitting: Internal Medicine

## 2015-07-29 ENCOUNTER — Telehealth: Payer: Self-pay | Admitting: Cardiology

## 2015-07-29 DIAGNOSIS — G8929 Other chronic pain: Secondary | ICD-10-CM

## 2015-07-29 DIAGNOSIS — F419 Anxiety disorder, unspecified: Secondary | ICD-10-CM

## 2015-07-29 DIAGNOSIS — M545 Low back pain, unspecified: Secondary | ICD-10-CM

## 2015-07-29 DIAGNOSIS — Z09 Encounter for follow-up examination after completed treatment for conditions other than malignant neoplasm: Secondary | ICD-10-CM

## 2015-07-29 NOTE — Telephone Encounter (Signed)
Spoke with [name], the pt is due to have a CT to f/u on a nodule and he is also due for CT-A to f/u thoracic aneurysm. We will schedule our testing and forward those results to her.

## 2015-07-29 NOTE — Telephone Encounter (Signed)
Sarah NP is calling about a CT

## 2015-07-29 NOTE — Telephone Encounter (Signed)
Pt request refill morphine (MSIR) 15 MG tablet ALPRAZolam (XANAX) 0.25 MG tablet  Pt would like to pick up both prescriptions

## 2015-07-30 ENCOUNTER — Telehealth: Payer: Self-pay | Admitting: *Deleted

## 2015-07-30 NOTE — Telephone Encounter (Signed)
AFB sputum came back positive. It was sent out for ID.

## 2015-08-02 MED ORDER — ALPRAZOLAM 0.25 MG PO TABS: 0.2500 mg | ORAL_TABLET | Freq: Three times a day (TID) | ORAL | Status: DC | PRN

## 2015-08-02 NOTE — Telephone Encounter (Signed)
Alprazolam called in. I spoke with the patient and he states he was seen last month by Georgina Snell and wants to know if he still needs to be seen?

## 2015-08-02 NOTE — Telephone Encounter (Signed)
Pt pharm is [facility] [address]

## 2015-08-02 NOTE — Telephone Encounter (Signed)
He needs appt before morphine can be refilled.  Ok to RF alprazolam x 2

## 2015-08-03 NOTE — Telephone Encounter (Signed)
Ok to refill morphine x 2

## 2015-08-04 MED ORDER — MORPHINE SULFATE 15 MG PO TABS: 15.0000 mg | ORAL_TABLET | Freq: Three times a day (TID) | ORAL | Status: DC

## 2015-08-04 NOTE — Telephone Encounter (Signed)
Mrs salbador fiveash for 1 refill.  rx ready for pick up.

## 2015-08-13 ENCOUNTER — Telehealth: Payer: Self-pay | Admitting: *Deleted

## 2015-08-13 DIAGNOSIS — I712 Thoracic aortic aneurysm, without rupture, unspecified: Secondary | ICD-10-CM

## 2015-08-13 LAB — FUNGUS CULTURE W SMEAR: SMEAR RESULT: NONE SEEN

## 2015-08-13 NOTE — Telephone Encounter (Signed)
CT scheduled for 08/30/15 @ 10:30 am. Pt will go to the lab on [address] for bmp prior to scan. He will be NPO 2 hours prior to scan. Patient voiced understanding of appt date and location.

## 2015-08-13 NOTE — Telephone Encounter (Signed)
Spoke with pt, he is due to have a CT A to follow up thoracic aortic aneurysm. Order placed and scheduled.

## 2015-08-17 DIAGNOSIS — J449 Chronic obstructive pulmonary disease, unspecified: Secondary | ICD-10-CM | POA: Diagnosis not present

## 2015-08-25 ENCOUNTER — Telehealth: Payer: Self-pay | Admitting: Cardiology

## 2015-08-25 DIAGNOSIS — I255 Ischemic cardiomyopathy: Secondary | ICD-10-CM

## 2015-08-25 DIAGNOSIS — I2589 Other forms of chronic ischemic heart disease: Secondary | ICD-10-CM | POA: Diagnosis not present

## 2015-08-25 LAB — BASIC METABOLIC PANEL
BUN: 23 mg/dL (ref 7–25)
CHLORIDE: 102 mmol/L (ref 98–110)
CO2: 31 mmol/L (ref 20–31)
CREATININE: 1.21 mg/dL (ref 0.70–1.25)
Calcium: 9.5 mg/dL (ref 8.6–10.3)
Glucose, Bld: 108 mg/dL — ABNORMAL HIGH (ref 65–99)
POTASSIUM: 5 mmol/L (ref 3.5–5.3)
SODIUM: 142 mmol/L (ref 135–146)

## 2015-08-25 NOTE — Telephone Encounter (Signed)
Lab order placed.

## 2015-08-25 NOTE — Telephone Encounter (Signed)
Pt is in the lab to have his BMET drawn

## 2015-08-30 ENCOUNTER — Ambulatory Visit (INDEPENDENT_AMBULATORY_CARE_PROVIDER_SITE_OTHER)
Admission: RE | Admit: 2015-08-30 | Discharge: 2015-08-30 | Disposition: A | Discharge status: Home / Self Care | Payer: Commercial Managed Care - HMO | Source: Ambulatory Visit | Attending: Cardiology | Admitting: Cardiology

## 2015-08-30 DIAGNOSIS — I712 Thoracic aortic aneurysm, without rupture, unspecified: Secondary | ICD-10-CM

## 2015-08-30 MED ORDER — IOHEXOL 350 MG/ML SOLN
100.0000 mL | Freq: Once | INTRAVENOUS | Status: AC | PRN
  Administered 2015-08-30: 100 mL via INTRAVENOUS

## 2015-09-01 ENCOUNTER — Encounter: Payer: Self-pay | Admitting: Family Medicine

## 2015-09-01 ENCOUNTER — Ambulatory Visit (INDEPENDENT_AMBULATORY_CARE_PROVIDER_SITE_OTHER): Payer: Commercial Managed Care - HMO | Admitting: Family Medicine

## 2015-09-01 VITALS — BP 100/70 | HR 58 | Temp 98.6°F | Resp 16 | Ht 67.0 in | Wt 164.4 lb

## 2015-09-01 DIAGNOSIS — R05 Cough: Secondary | ICD-10-CM

## 2015-09-01 DIAGNOSIS — I10 Essential (primary) hypertension: Secondary | ICD-10-CM

## 2015-09-01 DIAGNOSIS — Z09 Encounter for follow-up examination after completed treatment for conditions other than malignant neoplasm: Secondary | ICD-10-CM

## 2015-09-01 DIAGNOSIS — M545 Low back pain, unspecified: Secondary | ICD-10-CM

## 2015-09-01 DIAGNOSIS — G8929 Other chronic pain: Secondary | ICD-10-CM

## 2015-09-01 DIAGNOSIS — E785 Hyperlipidemia, unspecified: Secondary | ICD-10-CM

## 2015-09-01 DIAGNOSIS — R059 Cough, unspecified: Secondary | ICD-10-CM

## 2015-09-01 LAB — HEPATIC FUNCTION PANEL
ALT: 12 U/L (ref 0–53)
AST: 14 U/L (ref 0–37)
Albumin: 4 g/dL (ref 3.5–5.2)
Alkaline Phosphatase: 100 U/L (ref 39–117)
BILIRUBIN DIRECT: 0.1 mg/dL (ref 0.0–0.3)
BILIRUBIN TOTAL: 0.6 mg/dL (ref 0.2–1.2)
TOTAL PROTEIN: 6.5 g/dL (ref 6.0–8.3)

## 2015-09-01 LAB — LIPID PANEL
CHOLESTEROL: 89 mg/dL (ref 0–200)
HDL: 34 mg/dL — ABNORMAL LOW (ref >39.00)
LDL CALC: 36 mg/dL (ref 0–99)
NonHDL: 55.19
TRIGLYCERIDES: 98 mg/dL (ref 0.0–149.0)
Total CHOL/HDL Ratio: 3
VLDL: 19.6 mg/dL (ref 0.0–40.0)

## 2015-09-01 MED ORDER — MORPHINE SULFATE 15 MG PO TABS: ORAL_TABLET | ORAL | Status: DC
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
## 2015-09-14 LAB — CP MYCOBACTERIAL IDENTIFICATION

## 2015-09-16 DIAGNOSIS — J449 Chronic obstructive pulmonary disease, unspecified: Secondary | ICD-10-CM | POA: Diagnosis not present

## 2015-09-27 ENCOUNTER — Other Ambulatory Visit: Payer: Self-pay | Admitting: Internal Medicine

## 2015-09-27 ENCOUNTER — Telehealth: Payer: Self-pay | Admitting: Pulmonary Disease

## 2015-09-27 NOTE — Telephone Encounter (Signed)
I spoke with the patient regarding his sputum culture which has grown out a nontuberculous mycobacteria. I also reviewed his CT angiogram of the chest from 08/30/15. He does have a 6 mm left upper lobe nodule which is unchanged from his prior CT imaging from October 2015. Other than for his underlying emphysema and some mild atelectasis in his bases there are no other changes that would be suggestive of NTM infection. The patient reports he did have a gastrointestinal illness over the last 4 days causing some fever and sweats but otherwise has had no infectious symptoms. Additionally he continues to have intermittent cough but this is only productive of a clear phlegm. I instructed the patient to contact my office if he develops any new color change or volume in his phlegm or infectious symptoms that would be suggestive of an atypical infection.

## 2015-10-01 ENCOUNTER — Telehealth: Payer: Self-pay

## 2015-10-01 DIAGNOSIS — Z09 Encounter for follow-up examination after completed treatment for conditions other than malignant neoplasm: Secondary | ICD-10-CM

## 2015-10-01 DIAGNOSIS — F419 Anxiety disorder, unspecified: Secondary | ICD-10-CM

## 2015-10-01 NOTE — Telephone Encounter (Signed)
Pt requesting ALPRAZolam (XANAX) 0.25 MG tablet  Pt last visit 09/01/15 with Dr Elease Hashimoto  Pt last Rx refill 08/02/15 #90 with one refill    Please advise

## 2015-10-05 ENCOUNTER — Telehealth: Payer: Self-pay | Admitting: Internal Medicine

## 2015-10-05 MED ORDER — ALPRAZOLAM 0.25 MG PO TABS: 0.2500 mg | ORAL_TABLET | Freq: Three times a day (TID) | ORAL | Status: DC | PRN

## 2015-10-05 NOTE — Telephone Encounter (Signed)
Rx sent 

## 2015-10-05 NOTE — Telephone Encounter (Signed)
Pt called stating that the pharmacy has sent over several requests for a refill on his Xanax and he needs someone to sign off on the rx so that he can get it refilled asap. Pt states that he is now out of the medicine. Please advise.

## 2015-10-05 NOTE — Telephone Encounter (Signed)
Ok to RF x 1

## 2015-10-05 NOTE — Telephone Encounter (Signed)
Okay to fill Xanax

## 2015-10-07 LAB — AFB CULTURE WITH SMEAR (NOT AT ARMC): Acid Fast Smear: NONE SEEN

## 2015-10-17 DIAGNOSIS — J449 Chronic obstructive pulmonary disease, unspecified: Secondary | ICD-10-CM | POA: Diagnosis not present

## 2015-10-29 ENCOUNTER — Ambulatory Visit: Payer: Commercial Managed Care - HMO | Admitting: Family Medicine

## 2015-11-01 ENCOUNTER — Ambulatory Visit (INDEPENDENT_AMBULATORY_CARE_PROVIDER_SITE_OTHER): Payer: Commercial Managed Care - HMO | Admitting: Pulmonary Disease

## 2015-11-01 ENCOUNTER — Ambulatory Visit (INDEPENDENT_AMBULATORY_CARE_PROVIDER_SITE_OTHER): Payer: Commercial Managed Care - HMO | Admitting: Family Medicine

## 2015-11-01 ENCOUNTER — Encounter: Payer: Self-pay | Admitting: Family Medicine

## 2015-11-01 ENCOUNTER — Encounter: Payer: Self-pay | Admitting: Pulmonary Disease

## 2015-11-01 ENCOUNTER — Other Ambulatory Visit: Payer: Commercial Managed Care - HMO

## 2015-11-01 VITALS — BP 90/62 | HR 86 | Temp 98.7°F | Ht 67.0 in | Wt 160.0 lb

## 2015-11-01 VITALS — BP 102/58 | HR 70 | Ht 67.0 in | Wt 160.2 lb

## 2015-11-01 DIAGNOSIS — J449 Chronic obstructive pulmonary disease, unspecified: Secondary | ICD-10-CM

## 2015-11-01 DIAGNOSIS — G8929 Other chronic pain: Secondary | ICD-10-CM

## 2015-11-01 DIAGNOSIS — M545 Low back pain: Secondary | ICD-10-CM

## 2015-11-01 DIAGNOSIS — Z09 Encounter for follow-up examination after completed treatment for conditions other than malignant neoplasm: Secondary | ICD-10-CM | POA: Diagnosis not present

## 2015-11-01 DIAGNOSIS — F1190 Opioid use, unspecified, uncomplicated: Secondary | ICD-10-CM | POA: Diagnosis not present

## 2015-11-01 MED ORDER — MORPHINE SULFATE 15 MG PO TABS: ORAL_TABLET | ORAL | Status: DC

## 2015-11-01 MED ORDER — MORPHINE SULFATE 15 MG PO TABS: 15.0000 mg | ORAL_TABLET | Freq: Three times a day (TID) | ORAL | Status: DC

## 2015-11-01 NOTE — Progress Notes (Signed)
Pre visit review using our clinic review tool, if applicable. No additional management support is needed unless otherwise documented below in the visit note. 

## 2015-11-01 NOTE — Patient Instructions (Signed)
1. Continue your medicines as prescribed. 2. I will call you if your lab test needs addressed before your appointment. 3. We will do a breathing & walking test at your next appointment. 4. I will see you back in 6 months or sooner if needed.  TESTS ORDERED: 1. Six-minute walk test without oxygen at next appointment 2. Full PFT at next appointment 3. Alpha-1 Antitryspin Phenotype today

## 2015-11-01 NOTE — Addendum Note (Signed)
Addended by: Wynn Banker H on: 11/01/2015 03:05 PM   Modules accepted: Orders
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
## 2015-11-09 LAB — ALPHA-1 ANTITRYPSIN PHENOTYPE: A1 ANTITRYPSIN: 218 mg/dL — AB (ref 83–199)

## 2015-11-17 DIAGNOSIS — J449 Chronic obstructive pulmonary disease, unspecified: Secondary | ICD-10-CM | POA: Diagnosis not present
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
## 2015-11-29 ENCOUNTER — Ambulatory Visit (INDEPENDENT_AMBULATORY_CARE_PROVIDER_SITE_OTHER): Payer: Commercial Managed Care - HMO | Admitting: Cardiology

## 2015-11-29 ENCOUNTER — Encounter: Payer: Self-pay | Admitting: Cardiology

## 2015-11-29 VITALS — BP 118/72 | HR 62 | Ht 67.0 in | Wt 162.0 lb

## 2015-11-29 DIAGNOSIS — I10 Essential (primary) hypertension: Secondary | ICD-10-CM | POA: Diagnosis not present

## 2015-11-29 DIAGNOSIS — I679 Cerebrovascular disease, unspecified: Secondary | ICD-10-CM

## 2015-11-29 DIAGNOSIS — E785 Hyperlipidemia, unspecified: Secondary | ICD-10-CM

## 2015-11-29 DIAGNOSIS — I255 Ischemic cardiomyopathy: Secondary | ICD-10-CM

## 2015-11-29 DIAGNOSIS — I2510 Atherosclerotic heart disease of native coronary artery without angina pectoris: Secondary | ICD-10-CM | POA: Diagnosis not present

## 2015-11-29 DIAGNOSIS — I701 Atherosclerosis of renal artery: Secondary | ICD-10-CM

## 2015-11-29 NOTE — Assessment & Plan Note (Signed)
Patient has a large abdominal aortic aneurysm. This is followed in [facility] and he has not followed up there. I again explained the serious nature of his disease. I explained that if it ruptures it will likely be life-threatening. I again recommended that he follow-up in [facility] and he is agreeable.

## 2015-11-29 NOTE — Assessment & Plan Note (Signed)
Continue aspirin and statin. 

## 2015-11-29 NOTE — Assessment & Plan Note (Signed)
Continue aspirin and statin. Followed in [place].

## 2015-11-29 NOTE — Assessment & Plan Note (Signed)
Continue statin. 

## 2015-11-29 NOTE — Assessment & Plan Note (Signed)
Blood pressure controlled. Continue present medications. 

## 2015-11-29 NOTE — Assessment & Plan Note (Signed)
Continue aspirin and statin. Recent brief episode of chest pain. Some increased T-wave inversion laterally. Plan Lexiscan nuclear study for risk stratification.

## 2015-11-29 NOTE — Assessment & Plan Note (Signed)
Follow-up CTA November 2017.

## 2015-11-29 NOTE — Assessment & Plan Note (Addendum)
Continue hydralazine/nitrates. Continue beta blocker. Patient had acute renal failure with ARB previously. Repeat echocardiogram.

## 2015-11-29 NOTE — Patient Instructions (Signed)
Testing/Procedures:  Your physician has requested that you have a lexiscan myoview. For further information please visit [email or web address]. Please follow instruction sheet, as given.   Your physician has requested that you have an echocardiogram. Echocardiography is a painless test that uses sound waves to create images of your heart. It provides your doctor with information about the size and shape of your heart and how well your heart's chambers and valves are working. This procedure takes approximately one hour. There are no restrictions for this procedure.    Follow-Up:  Your physician wants you to follow-up in: [place] will receive a reminder letter in the mail two months in advance. If you don't receive a letter, please call our office to schedule the follow-up appointment.   If you need a refill on your cardiac medications before your next appointment, please call your pharmacy.

## 2015-11-30 ENCOUNTER — Other Ambulatory Visit: Payer: Self-pay | Admitting: Internal Medicine

## 2015-12-02 ENCOUNTER — Telehealth: Payer: Self-pay | Admitting: Family Medicine

## 2015-12-02 DIAGNOSIS — Z09 Encounter for follow-up examination after completed treatment for conditions other than malignant neoplasm: Secondary | ICD-10-CM

## 2015-12-02 DIAGNOSIS — F419 Anxiety disorder, unspecified: Secondary | ICD-10-CM

## 2015-12-02 NOTE — Telephone Encounter (Signed)
Rx was last filled on 1.3.2017 #90 with 1 rf. Pt last seen 1.30.2017.  I will call the pharmacy and check on this.

## 2015-12-02 NOTE — Telephone Encounter (Signed)
Patient need a refill of medication xanax, Dr Shawna Orleans normally prescribe it for him, but he is asking if Dr Elease Hashimoto will this time, please advise.

## 2015-12-03 MED ORDER — ALPRAZOLAM 0.25 MG PO TABS: 0.2500 mg | ORAL_TABLET | Freq: Three times a day (TID) | ORAL | Status: DC | PRN

## 2015-12-03 NOTE — Telephone Encounter (Signed)
Rx called in to pharmacy. 

## 2015-12-03 NOTE — Telephone Encounter (Signed)
Refill once.  He will need to establish with new primary provider.

## 2015-12-03 NOTE — Telephone Encounter (Signed)
Ok I see that pt takes this tid #90 so pt does need a refill. Pls advise.

## 2015-12-08 NOTE — Addendum Note (Signed)
Addended by: Therisa Doyne on: 12/08/2015 03:54 PM   Modules accepted: Orders

## 2015-12-09 ENCOUNTER — Telehealth (HOSPITAL_COMMUNITY): Payer: Self-pay

## 2015-12-09 NOTE — Telephone Encounter (Signed)
Encounter complete. 

## 2015-12-13 ENCOUNTER — Telehealth: Payer: Self-pay | Admitting: Internal Medicine

## 2015-12-13 NOTE — Telephone Encounter (Signed)
Pt will need new rx morphine by end of this month

## 2015-12-14 ENCOUNTER — Ambulatory Visit (HOSPITAL_COMMUNITY)
Admission: RE | Admit: 2015-12-14 | Discharge: 2015-12-14 | Disposition: A | Discharge status: Home / Self Care | Payer: Commercial Managed Care - HMO | Source: Ambulatory Visit | Attending: Cardiovascular Disease | Admitting: Cardiovascular Disease

## 2015-12-14 DIAGNOSIS — R0609 Other forms of dyspnea: Secondary | ICD-10-CM | POA: Diagnosis not present

## 2015-12-14 DIAGNOSIS — I10 Essential (primary) hypertension: Secondary | ICD-10-CM | POA: Diagnosis not present

## 2015-12-14 DIAGNOSIS — Z8249 Family history of ischemic heart disease and other diseases of the circulatory system: Secondary | ICD-10-CM | POA: Insufficient documentation

## 2015-12-14 DIAGNOSIS — I739 Peripheral vascular disease, unspecified: Secondary | ICD-10-CM | POA: Insufficient documentation

## 2015-12-14 DIAGNOSIS — I779 Disorder of arteries and arterioles, unspecified: Secondary | ICD-10-CM | POA: Insufficient documentation

## 2015-12-14 DIAGNOSIS — R5383 Other fatigue: Secondary | ICD-10-CM | POA: Insufficient documentation

## 2015-12-14 DIAGNOSIS — I2510 Atherosclerotic heart disease of native coronary artery without angina pectoris: Secondary | ICD-10-CM | POA: Diagnosis not present

## 2015-12-14 DIAGNOSIS — R9439 Abnormal result of other cardiovascular function study: Secondary | ICD-10-CM | POA: Diagnosis not present

## 2015-12-14 LAB — MYOCARDIAL PERFUSION IMAGING
CSEPPHR: 82 {beats}/min
LV dias vol: 175 mL (ref 62–150)
LVSYSVOL: 96 mL
NUC STRESS TID: 1.25
Rest HR: 63 {beats}/min
SDS: 7
SRS: 18
SSS: 25

## 2015-12-14 MED ORDER — TECHNETIUM TC 99M SESTAMIBI GENERIC - CARDIOLITE
30.8000 | Freq: Once | INTRAVENOUS | Status: AC | PRN
  Administered 2015-12-14: 30.8 via INTRAVENOUS

## 2015-12-14 MED ORDER — AMINOPHYLLINE 25 MG/ML IV SOLN
75.0000 mg | Freq: Once | INTRAVENOUS | Status: AC
  Administered 2015-12-14: 75 mg via INTRAVENOUS

## 2015-12-14 MED ORDER — REGADENOSON 0.4 MG/5ML IV SOLN
0.4000 mg | Freq: Once | INTRAVENOUS | Status: AC
  Administered 2015-12-14: 0.4 mg via INTRAVENOUS

## 2015-12-14 MED ORDER — TECHNETIUM TC 99M SESTAMIBI GENERIC - CARDIOLITE
10.5000 | Freq: Once | INTRAVENOUS | Status: AC | PRN
  Administered 2015-12-14: 10.5 via INTRAVENOUS

## 2015-12-14 NOTE — Telephone Encounter (Signed)
Ok to RF x 2.  Please see if Dr. Raliegh Ip will sign prescriptions.

## 2015-12-15 DIAGNOSIS — J449 Chronic obstructive pulmonary disease, unspecified: Secondary | ICD-10-CM | POA: Diagnosis not present

## 2015-12-18 ENCOUNTER — Other Ambulatory Visit: Payer: Self-pay | Admitting: Critical Care Medicine

## 2015-12-18 ENCOUNTER — Other Ambulatory Visit: Payer: Self-pay | Admitting: Pulmonary Disease

## 2015-12-20 MED ORDER — MORPHINE SULFATE 15 MG PO TABS: 15.0000 mg | ORAL_TABLET | Freq: Three times a day (TID) | ORAL | Status: DC

## 2015-12-20 NOTE — Addendum Note (Signed)
Addended by: Westley Hummer B on: 12/20/2015 11:37 AM   Modules accepted: Orders

## 2015-12-20 NOTE — Telephone Encounter (Signed)
rx ready for pick up and patient is aware  

## 2015-12-20 NOTE — Telephone Encounter (Signed)
Okay 

## 2015-12-21 ENCOUNTER — Other Ambulatory Visit: Payer: Self-pay

## 2015-12-21 ENCOUNTER — Ambulatory Visit (HOSPITAL_COMMUNITY): Payer: Commercial Managed Care - HMO | Attending: Cardiovascular Disease

## 2015-12-21 DIAGNOSIS — Z87891 Personal history of nicotine dependence: Secondary | ICD-10-CM | POA: Diagnosis not present

## 2015-12-21 DIAGNOSIS — I739 Peripheral vascular disease, unspecified: Secondary | ICD-10-CM | POA: Diagnosis not present

## 2015-12-21 DIAGNOSIS — R29898 Other symptoms and signs involving the musculoskeletal system: Secondary | ICD-10-CM | POA: Insufficient documentation

## 2015-12-21 DIAGNOSIS — I2510 Atherosclerotic heart disease of native coronary artery without angina pectoris: Secondary | ICD-10-CM | POA: Diagnosis not present

## 2015-12-21 DIAGNOSIS — I255 Ischemic cardiomyopathy: Secondary | ICD-10-CM | POA: Insufficient documentation

## 2015-12-21 DIAGNOSIS — E785 Hyperlipidemia, unspecified: Secondary | ICD-10-CM | POA: Diagnosis not present

## 2015-12-21 DIAGNOSIS — I509 Heart failure, unspecified: Secondary | ICD-10-CM | POA: Diagnosis not present

## 2015-12-21 DIAGNOSIS — I77811 Abdominal aortic ectasia: Secondary | ICD-10-CM | POA: Diagnosis not present

## 2015-12-21 DIAGNOSIS — J449 Chronic obstructive pulmonary disease, unspecified: Secondary | ICD-10-CM | POA: Diagnosis not present

## 2015-12-21 DIAGNOSIS — I77810 Thoracic aortic ectasia: Secondary | ICD-10-CM | POA: Diagnosis not present

## 2015-12-21 DIAGNOSIS — I252 Old myocardial infarction: Secondary | ICD-10-CM | POA: Insufficient documentation

## 2015-12-21 DIAGNOSIS — Z8249 Family history of ischemic heart disease and other diseases of the circulatory system: Secondary | ICD-10-CM | POA: Insufficient documentation

## 2015-12-21 DIAGNOSIS — I110 Hypertensive heart disease with heart failure: Secondary | ICD-10-CM | POA: Diagnosis not present

## 2015-12-22 ENCOUNTER — Other Ambulatory Visit: Payer: Self-pay | Admitting: Acute Care

## 2015-12-22 DIAGNOSIS — Z87891 Personal history of nicotine dependence: Secondary | ICD-10-CM

## 2015-12-27 ENCOUNTER — Other Ambulatory Visit: Payer: Self-pay | Admitting: Family Medicine

## 2015-12-28 ENCOUNTER — Telehealth: Payer: Self-pay | Admitting: Adult Health

## 2015-12-28 ENCOUNTER — Ambulatory Visit (INDEPENDENT_AMBULATORY_CARE_PROVIDER_SITE_OTHER): Payer: Commercial Managed Care - HMO | Admitting: Adult Health

## 2015-12-28 ENCOUNTER — Encounter: Payer: Self-pay | Admitting: Adult Health

## 2015-12-28 VITALS — BP 100/60 | HR 101 | Temp 98.5°F | Ht 67.0 in | Wt 158.0 lb

## 2015-12-28 DIAGNOSIS — J449 Chronic obstructive pulmonary disease, unspecified: Secondary | ICD-10-CM

## 2015-12-28 MED ORDER — PREDNISONE 10 MG PO TABS: ORAL_TABLET | ORAL | Status: DC

## 2015-12-28 MED ORDER — AZITHROMYCIN 250 MG PO TABS: ORAL_TABLET | ORAL | Status: DC

## 2015-12-28 MED ORDER — HYDROCODONE-HOMATROPINE 5-1.5 MG/5ML PO SYRP: 5.0000 mL | ORAL_SOLUTION | Freq: Four times a day (QID) | ORAL | Status: DC | PRN

## 2015-12-28 MED ORDER — HYDROCOD POLST-CPM POLST ER 10-8 MG/5ML PO SUER: ORAL | Status: DC
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
## 2015-12-28 NOTE — Telephone Encounter (Signed)
Called and spoke with [name] at [facility]. He states that the patient can only be given a partial refill on the Hydromet due to quantity amount. He states that he can fill the hydromet but will only be a partial amount. He explained that he can not order any without a new rx if partial amount is given. He also states that the pt informed him that [facility] [address] to try and get the medication filled.   Called and spoke with a pharmacy tech at [facility] on [address]. She states the hydromet is on back order and can not be filled at this time.   Informed TP of situation Per verbal order for TP  Okay to send Tussionex Take 1 tsp twice daily as needed for cough #115ml with No refills  Called and spoke with [name] at [facility]. He states that they do have tussionex in stock.   Called and spoke with the pt. Informed him of the above. I explained to him that the tussionex rx will be printed and placed at the front of the office for pick up. He voiced understanding and had no further questions. Rx printed, signed, and placed at the front of the office. Nothing further needed.

## 2015-12-28 NOTE — Telephone Encounter (Signed)
Refill once by 01-02-16.  He needs to establish with a new primary provider (pt of Dr Shawna Orleans).

## 2015-12-28 NOTE — Assessment & Plan Note (Signed)
Exacerbation   Plan  Zpack take as directed.  Prednisone taper over next week.  Mucinex DM Twice daily  As needed  Cough/congestion  Hydromet 1 tsp every 6hr as needed for cough , may make you sleepy  Follow up with Dr. Ashok Cordia as planned and As needed   Please contact office for sooner follow up if symptoms do not improve or worsen or seek emergency care

## 2015-12-28 NOTE — Telephone Encounter (Signed)
Pt received #90 on 12/03/2015 Last seen on 11/01/2015 Please advise

## 2015-12-28 NOTE — Patient Instructions (Addendum)
Zpack take as directed.  Prednisone taper over next week.  Mucinex DM Twice daily  As needed  Cough/congestion  Hydromet 1 tsp every 6hr as needed for cough , may make you sleepy  Follow up with Dr. Ashok Cordia as planned and As needed   Please contact office for sooner follow up if symptoms do not improve or worsen or seek emergency care

## 2015-12-30 ENCOUNTER — Emergency Department (HOSPITAL_COMMUNITY): Payer: Commercial Managed Care - HMO

## 2015-12-30 ENCOUNTER — Encounter (HOSPITAL_COMMUNITY): Payer: Self-pay | Admitting: *Deleted

## 2015-12-30 ENCOUNTER — Inpatient Hospital Stay (HOSPITAL_COMMUNITY)
Admission: EM | Admit: 2015-12-30 | Discharge: 2016-01-06 | DRG: 193 | Disposition: A | Discharge status: Home / Self Care | Payer: Commercial Managed Care - HMO | Attending: Internal Medicine | Admitting: Internal Medicine

## 2015-12-30 DIAGNOSIS — E785 Hyperlipidemia, unspecified: Secondary | ICD-10-CM | POA: Diagnosis present

## 2015-12-30 DIAGNOSIS — I714 Abdominal aortic aneurysm, without rupture, unspecified: Secondary | ICD-10-CM | POA: Diagnosis present

## 2015-12-30 DIAGNOSIS — M545 Low back pain, unspecified: Secondary | ICD-10-CM | POA: Diagnosis present

## 2015-12-30 DIAGNOSIS — E875 Hyperkalemia: Secondary | ICD-10-CM | POA: Diagnosis not present

## 2015-12-30 DIAGNOSIS — J441 Chronic obstructive pulmonary disease with (acute) exacerbation: Secondary | ICD-10-CM | POA: Diagnosis present

## 2015-12-30 DIAGNOSIS — Z79899 Other long term (current) drug therapy: Secondary | ICD-10-CM | POA: Diagnosis not present

## 2015-12-30 DIAGNOSIS — Z8249 Family history of ischemic heart disease and other diseases of the circulatory system: Secondary | ICD-10-CM | POA: Diagnosis not present

## 2015-12-30 DIAGNOSIS — G8929 Other chronic pain: Secondary | ICD-10-CM | POA: Diagnosis not present

## 2015-12-30 DIAGNOSIS — I255 Ischemic cardiomyopathy: Secondary | ICD-10-CM | POA: Diagnosis present

## 2015-12-30 DIAGNOSIS — R0602 Shortness of breath: Secondary | ICD-10-CM

## 2015-12-30 DIAGNOSIS — I2510 Atherosclerotic heart disease of native coronary artery without angina pectoris: Secondary | ICD-10-CM | POA: Diagnosis present

## 2015-12-30 DIAGNOSIS — I708 Atherosclerosis of other arteries: Secondary | ICD-10-CM | POA: Diagnosis present

## 2015-12-30 DIAGNOSIS — Z7982 Long term (current) use of aspirin: Secondary | ICD-10-CM

## 2015-12-30 DIAGNOSIS — I6523 Occlusion and stenosis of bilateral carotid arteries: Secondary | ICD-10-CM | POA: Diagnosis present

## 2015-12-30 DIAGNOSIS — I712 Thoracic aortic aneurysm, without rupture, unspecified: Secondary | ICD-10-CM | POA: Diagnosis present

## 2015-12-30 DIAGNOSIS — I701 Atherosclerosis of renal artery: Secondary | ICD-10-CM | POA: Diagnosis present

## 2015-12-30 DIAGNOSIS — R069 Unspecified abnormalities of breathing: Secondary | ICD-10-CM | POA: Diagnosis not present

## 2015-12-30 DIAGNOSIS — Z9981 Dependence on supplemental oxygen: Secondary | ICD-10-CM

## 2015-12-30 DIAGNOSIS — J9601 Acute respiratory failure with hypoxia: Secondary | ICD-10-CM | POA: Diagnosis not present

## 2015-12-30 DIAGNOSIS — I252 Old myocardial infarction: Secondary | ICD-10-CM | POA: Diagnosis not present

## 2015-12-30 DIAGNOSIS — J449 Chronic obstructive pulmonary disease, unspecified: Secondary | ICD-10-CM

## 2015-12-30 DIAGNOSIS — I110 Hypertensive heart disease with heart failure: Secondary | ICD-10-CM | POA: Diagnosis present

## 2015-12-30 DIAGNOSIS — Z833 Family history of diabetes mellitus: Secondary | ICD-10-CM | POA: Diagnosis not present

## 2015-12-30 DIAGNOSIS — I5042 Chronic combined systolic (congestive) and diastolic (congestive) heart failure: Secondary | ICD-10-CM | POA: Diagnosis not present

## 2015-12-30 DIAGNOSIS — Z87891 Personal history of nicotine dependence: Secondary | ICD-10-CM | POA: Diagnosis not present

## 2015-12-30 DIAGNOSIS — J101 Influenza due to other identified influenza virus with other respiratory manifestations: Secondary | ICD-10-CM | POA: Diagnosis not present

## 2015-12-30 DIAGNOSIS — J9621 Acute and chronic respiratory failure with hypoxia: Secondary | ICD-10-CM | POA: Diagnosis present

## 2015-12-30 DIAGNOSIS — I10 Essential (primary) hypertension: Secondary | ICD-10-CM | POA: Diagnosis present

## 2015-12-30 DIAGNOSIS — Z720 Tobacco use: Secondary | ICD-10-CM | POA: Diagnosis present

## 2015-12-30 MED ORDER — ALBUTEROL SULFATE (2.5 MG/3ML) 0.083% IN NEBU
5.0000 mg | INHALATION_SOLUTION | Freq: Once | RESPIRATORY_TRACT | Status: AC
  Administered 2015-12-30: 5 mg via RESPIRATORY_TRACT
  Filled 2015-12-30: qty 6

## 2015-12-30 MED ORDER — METHYLPREDNISOLONE SODIUM SUCC 125 MG IJ SOLR
125.0000 mg | Freq: Once | INTRAMUSCULAR | Status: AC
  Administered 2015-12-31: 125 mg via INTRAVENOUS
  Filled 2015-12-30: qty 2

## 2015-12-30 NOTE — ED Notes (Signed)
Pt arrives to the ER via EMS for complaints of Resp Distress; pt states that he was recently diagnosed with a Resp Infection and has been taking antibiotics; pt states that he became more short of breath approx 2 hrs ago and worse 40 min PTA; Upon EMS arrival pt was breathing 50 times per minute and had increased work of breathing; Upon arrival to the ER pt is breathing approx 32 times per minute on his 2nd DuoNeb; pt was given 125mg  Solumedrol en route via EMS; pt reports productive cough with white sputum; pt denies N/V
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
## 2015-12-31 ENCOUNTER — Encounter (HOSPITAL_COMMUNITY): Payer: Self-pay

## 2015-12-31 ENCOUNTER — Emergency Department (HOSPITAL_COMMUNITY): Payer: Commercial Managed Care - HMO

## 2015-12-31 DIAGNOSIS — I5042 Chronic combined systolic (congestive) and diastolic (congestive) heart failure: Secondary | ICD-10-CM | POA: Diagnosis present

## 2015-12-31 DIAGNOSIS — Z833 Family history of diabetes mellitus: Secondary | ICD-10-CM | POA: Diagnosis not present

## 2015-12-31 DIAGNOSIS — Z7982 Long term (current) use of aspirin: Secondary | ICD-10-CM | POA: Diagnosis not present

## 2015-12-31 DIAGNOSIS — I252 Old myocardial infarction: Secondary | ICD-10-CM | POA: Diagnosis not present

## 2015-12-31 DIAGNOSIS — I10 Essential (primary) hypertension: Secondary | ICD-10-CM | POA: Diagnosis not present

## 2015-12-31 DIAGNOSIS — I708 Atherosclerosis of other arteries: Secondary | ICD-10-CM | POA: Diagnosis present

## 2015-12-31 DIAGNOSIS — I6523 Occlusion and stenosis of bilateral carotid arteries: Secondary | ICD-10-CM | POA: Diagnosis present

## 2015-12-31 DIAGNOSIS — I2510 Atherosclerotic heart disease of native coronary artery without angina pectoris: Secondary | ICD-10-CM | POA: Diagnosis present

## 2015-12-31 DIAGNOSIS — Z9981 Dependence on supplemental oxygen: Secondary | ICD-10-CM | POA: Diagnosis not present

## 2015-12-31 DIAGNOSIS — E875 Hyperkalemia: Secondary | ICD-10-CM | POA: Diagnosis present

## 2015-12-31 DIAGNOSIS — J9601 Acute respiratory failure with hypoxia: Secondary | ICD-10-CM | POA: Diagnosis not present

## 2015-12-31 DIAGNOSIS — Z87891 Personal history of nicotine dependence: Secondary | ICD-10-CM | POA: Diagnosis not present

## 2015-12-31 DIAGNOSIS — I712 Thoracic aortic aneurysm, without rupture: Secondary | ICD-10-CM | POA: Diagnosis present

## 2015-12-31 DIAGNOSIS — I255 Ischemic cardiomyopathy: Secondary | ICD-10-CM | POA: Diagnosis present

## 2015-12-31 DIAGNOSIS — E785 Hyperlipidemia, unspecified: Secondary | ICD-10-CM | POA: Diagnosis present

## 2015-12-31 DIAGNOSIS — J101 Influenza due to other identified influenza virus with other respiratory manifestations: Secondary | ICD-10-CM | POA: Diagnosis present

## 2015-12-31 DIAGNOSIS — Z79899 Other long term (current) drug therapy: Secondary | ICD-10-CM | POA: Diagnosis not present

## 2015-12-31 DIAGNOSIS — Z8249 Family history of ischemic heart disease and other diseases of the circulatory system: Secondary | ICD-10-CM | POA: Diagnosis not present

## 2015-12-31 DIAGNOSIS — I701 Atherosclerosis of renal artery: Secondary | ICD-10-CM | POA: Diagnosis present

## 2015-12-31 DIAGNOSIS — J441 Chronic obstructive pulmonary disease with (acute) exacerbation: Secondary | ICD-10-CM | POA: Diagnosis present

## 2015-12-31 DIAGNOSIS — G8929 Other chronic pain: Secondary | ICD-10-CM | POA: Diagnosis present

## 2015-12-31 DIAGNOSIS — I714 Abdominal aortic aneurysm, without rupture: Secondary | ICD-10-CM | POA: Diagnosis present

## 2015-12-31 DIAGNOSIS — M545 Low back pain: Secondary | ICD-10-CM | POA: Diagnosis present

## 2015-12-31 DIAGNOSIS — J9621 Acute and chronic respiratory failure with hypoxia: Secondary | ICD-10-CM | POA: Diagnosis present

## 2015-12-31 DIAGNOSIS — R0602 Shortness of breath: Secondary | ICD-10-CM | POA: Diagnosis present

## 2015-12-31 DIAGNOSIS — I110 Hypertensive heart disease with heart failure: Secondary | ICD-10-CM | POA: Diagnosis present

## 2015-12-31 LAB — INFLUENZA PANEL BY PCR (TYPE A & B)
H1N1 flu by pcr: NOT DETECTED
INFLAPCR: NEGATIVE
INFLBPCR: POSITIVE — AB

## 2015-12-31 LAB — COMPREHENSIVE METABOLIC PANEL
ALBUMIN: 4.2 g/dL (ref 3.5–5.0)
ALT: 29 U/L (ref 17–63)
AST: 34 U/L (ref 15–41)
Alkaline Phosphatase: 126 U/L (ref 38–126)
Anion gap: 11 (ref 5–15)
BUN: 33 mg/dL — AB (ref 6–20)
CHLORIDE: 102 mmol/L (ref 101–111)
CO2: 27 mmol/L (ref 22–32)
Calcium: 9.1 mg/dL (ref 8.9–10.3)
Creatinine, Ser: 1.19 mg/dL (ref 0.61–1.24)
GFR calc Af Amer: >60 mL/min (ref >60)
GFR calc non Af Amer: >60 mL/min (ref >60)
GLUCOSE: 138 mg/dL — AB (ref 65–99)
POTASSIUM: 5.3 mmol/L — AB (ref 3.5–5.1)
SODIUM: 140 mmol/L (ref 135–145)
Total Bilirubin: 0.3 mg/dL (ref 0.3–1.2)
Total Protein: 7.6 g/dL (ref 6.5–8.1)

## 2015-12-31 LAB — CBC WITH DIFFERENTIAL/PLATELET
BASOS ABS: 0 10*3/uL (ref 0.0–0.1)
BASOS PCT: 0 %
EOS ABS: 0 10*3/uL (ref 0.0–0.7)
EOS PCT: 0 %
HCT: 38.8 % — ABNORMAL LOW (ref 39.0–52.0)
Hemoglobin: 12.9 g/dL — ABNORMAL LOW (ref 13.0–17.0)
Lymphocytes Relative: 14 %
Lymphs Abs: 0.7 10*3/uL (ref 0.7–4.0)
MCH: 30.3 pg (ref 26.0–34.0)
MCHC: 33.2 g/dL (ref 30.0–36.0)
MCV: 91.1 fL (ref 78.0–100.0)
MONO ABS: 0.5 10*3/uL (ref 0.1–1.0)
Monocytes Relative: 8 %
NEUTROS ABS: 4.3 10*3/uL (ref 1.7–7.7)
Neutrophils Relative %: 78 %
PLATELETS: 154 10*3/uL (ref 150–400)
RBC: 4.26 MIL/uL (ref 4.22–5.81)
RDW: 13.8 % (ref 11.5–15.5)
WBC: 5.5 10*3/uL (ref 4.0–10.5)

## 2015-12-31 LAB — BLOOD GAS, VENOUS
ACID-BASE EXCESS: 1.9 mmol/L (ref 0.0–2.0)
Bicarbonate: 27.1 mEq/L — ABNORMAL HIGH (ref 20.0–24.0)
DRAWN BY: 424391
O2 CONTENT: 2 L/min
O2 SAT: 82.3 %
PATIENT TEMPERATURE: 98.6
PO2 VEN: 47.6 mmHg — AB (ref 31.0–45.0)
TCO2: 24.6 mmol/L (ref 0–100)
pCO2, Ven: 47.6 mmHg (ref 45.0–50.0)
pH, Ven: 7.374 — ABNORMAL HIGH (ref 7.250–7.300)

## 2015-12-31 LAB — BASIC METABOLIC PANEL
ANION GAP: 12 (ref 5–15)
BUN: 34 mg/dL — ABNORMAL HIGH (ref 6–20)
CALCIUM: 9.2 mg/dL (ref 8.9–10.3)
CO2: 24 mmol/L (ref 22–32)
CREATININE: 1.05 mg/dL (ref 0.61–1.24)
Chloride: 102 mmol/L (ref 101–111)
Glucose, Bld: 172 mg/dL — ABNORMAL HIGH (ref 65–99)
Potassium: 5.2 mmol/L — ABNORMAL HIGH (ref 3.5–5.1)
SODIUM: 138 mmol/L (ref 135–145)

## 2015-12-31 LAB — I-STAT TROPONIN, ED: Troponin i, poc: 0 ng/mL (ref 0.00–0.08)

## 2015-12-31 LAB — D-DIMER, QUANTITATIVE (NOT AT ARMC): D DIMER QUANT: 2.59 ug{FEU}/mL — AB (ref 0.00–0.50)

## 2015-12-31 LAB — BRAIN NATRIURETIC PEPTIDE: B NATRIURETIC PEPTIDE 5: 158.3 pg/mL — AB (ref 0.0–100.0)

## 2015-12-31 MED ORDER — ISOSORBIDE DINITRATE 10 MG PO TABS
15.0000 mg | ORAL_TABLET | Freq: Every day | ORAL | Status: DC
  Administered 2015-12-31 – 2016-01-06 (×7): 15 mg via ORAL
  Filled 2015-12-31 (×7): qty 1

## 2015-12-31 MED ORDER — HYDRALAZINE HCL 10 MG PO TABS
10.0000 mg | ORAL_TABLET | Freq: Three times a day (TID) | ORAL | Status: DC
  Administered 2015-12-31 – 2016-01-06 (×19): 10 mg via ORAL
  Filled 2015-12-31 (×19): qty 1

## 2015-12-31 MED ORDER — ROFLUMILAST 500 MCG PO TABS
500.0000 ug | ORAL_TABLET | Freq: Every day | ORAL | Status: DC
  Administered 2015-12-31 – 2016-01-06 (×7): 500 ug via ORAL
  Filled 2015-12-31 (×7): qty 1

## 2015-12-31 MED ORDER — MORPHINE SULFATE (PF) 2 MG/ML IV SOLN
1.0000 mg | INTRAVENOUS | Status: DC | PRN
  Administered 2015-12-31 – 2016-01-01 (×4): 2 mg via INTRAVENOUS
  Filled 2015-12-31 (×4): qty 1

## 2015-12-31 MED ORDER — BISOPROLOL FUMARATE 5 MG PO TABS
5.0000 mg | ORAL_TABLET | Freq: Every day | ORAL | Status: DC
  Administered 2015-12-31 – 2016-01-06 (×7): 5 mg via ORAL
  Filled 2015-12-31 (×7): qty 1

## 2015-12-31 MED ORDER — ALPRAZOLAM 0.25 MG PO TABS
0.2500 mg | ORAL_TABLET | Freq: Three times a day (TID) | ORAL | Status: DC | PRN
  Administered 2015-12-31 – 2016-01-05 (×11): 0.25 mg via ORAL
  Filled 2015-12-31 (×11): qty 1

## 2015-12-31 MED ORDER — GUAIFENESIN ER 600 MG PO TB12
600.0000 mg | ORAL_TABLET | Freq: Two times a day (BID) | ORAL | Status: DC
  Administered 2015-12-31 – 2016-01-04 (×10): 600 mg via ORAL
  Filled 2015-12-31 (×10): qty 1

## 2015-12-31 MED ORDER — SODIUM CHLORIDE 0.9% FLUSH
3.0000 mL | Freq: Two times a day (BID) | INTRAVENOUS | Status: DC
  Administered 2015-12-31 – 2016-01-06 (×12): 3 mL via INTRAVENOUS

## 2015-12-31 MED ORDER — HYDROCODONE-HOMATROPINE 5-1.5 MG/5ML PO SYRP
5.0000 mL | ORAL_SOLUTION | Freq: Four times a day (QID) | ORAL | Status: DC | PRN
Start: 2015-12-31 — End: 2016-01-01

## 2015-12-31 MED ORDER — IPRATROPIUM BROMIDE 0.02 % IN SOLN: 0.5000 mg | Freq: Four times a day (QID) | RESPIRATORY_TRACT | Status: DC

## 2015-12-31 MED ORDER — LORAZEPAM 2 MG/ML IJ SOLN
0.5000 mg | Freq: Once | INTRAMUSCULAR | Status: AC
  Administered 2015-12-31: 0.5 mg via INTRAVENOUS
  Filled 2015-12-31: qty 1

## 2015-12-31 MED ORDER — POLYETHYLENE GLYCOL 3350 17 G PO PACK
17.0000 g | PACK | Freq: Every day | ORAL | Status: DC
  Administered 2015-12-31 – 2016-01-06 (×3): 17 g via ORAL
  Filled 2015-12-31 (×6): qty 1

## 2015-12-31 MED ORDER — HYDROMORPHONE HCL 1 MG/ML IJ SOLN
0.5000 mg | Freq: Once | INTRAMUSCULAR | Status: AC
  Administered 2015-12-31: 0.5 mg via INTRAVENOUS
  Filled 2015-12-31: qty 1

## 2015-12-31 MED ORDER — SODIUM CHLORIDE 0.9 % IV SOLN
INTRAVENOUS | Status: DC
  Administered 2015-12-31: 04:00:00 via INTRAVENOUS

## 2015-12-31 MED ORDER — OXYMETAZOLINE HCL 0.05 % NA SOLN
1.0000 | Freq: Two times a day (BID) | NASAL | Status: AC
  Administered 2015-12-31 – 2016-01-02 (×6): 1 via NASAL
  Filled 2015-12-31: qty 15

## 2015-12-31 MED ORDER — MORPHINE SULFATE 15 MG PO TABS
15.0000 mg | ORAL_TABLET | Freq: Three times a day (TID) | ORAL | Status: DC | PRN
  Administered 2015-12-31 – 2016-01-01 (×4): 15 mg via ORAL
  Filled 2015-12-31 (×4): qty 1

## 2015-12-31 MED ORDER — IOPAMIDOL (ISOVUE-370) INJECTION 76%
100.0000 mL | Freq: Once | INTRAVENOUS | Status: AC | PRN
  Administered 2015-12-31: 100 mL via INTRAVENOUS

## 2015-12-31 MED ORDER — ALBUTEROL SULFATE (2.5 MG/3ML) 0.083% IN NEBU: 2.5000 mg | INHALATION_SOLUTION | RESPIRATORY_TRACT | Status: DC | PRN

## 2015-12-31 MED ORDER — ONDANSETRON HCL 4 MG PO TABS: 4.0000 mg | ORAL_TABLET | Freq: Four times a day (QID) | ORAL | Status: DC | PRN

## 2015-12-31 MED ORDER — ENOXAPARIN SODIUM 40 MG/0.4ML ~~LOC~~ SOLN
40.0000 mg | SUBCUTANEOUS | Status: DC
Start: 2015-12-31 — End: 2016-01-06
  Administered 2015-12-31 – 2016-01-06 (×7): 40 mg via SUBCUTANEOUS
  Filled 2015-12-31 (×7): qty 0.4

## 2015-12-31 MED ORDER — OSELTAMIVIR PHOSPHATE 75 MG PO CAPS
75.0000 mg | ORAL_CAPSULE | Freq: Two times a day (BID) | ORAL | Status: AC
  Administered 2015-12-31 – 2016-01-05 (×10): 75 mg via ORAL
  Filled 2015-12-31 (×11): qty 1

## 2015-12-31 MED ORDER — METHYLPREDNISOLONE SODIUM SUCC 125 MG IJ SOLR
60.0000 mg | Freq: Two times a day (BID) | INTRAMUSCULAR | Status: DC
  Administered 2015-12-31: 60 mg via INTRAVENOUS
  Filled 2015-12-31: qty 2

## 2015-12-31 MED ORDER — CETYLPYRIDINIUM CHLORIDE 0.05 % MT LIQD
7.0000 mL | Freq: Two times a day (BID) | OROMUCOSAL | Status: DC
  Administered 2015-12-31 – 2016-01-05 (×12): 7 mL via OROMUCOSAL

## 2015-12-31 MED ORDER — TIOTROPIUM BROMIDE MONOHYDRATE 18 MCG IN CAPS
18.0000 ug | ORAL_CAPSULE | Freq: Every day | RESPIRATORY_TRACT | Status: DC
  Administered 2015-12-31: 18 ug via RESPIRATORY_TRACT
  Filled 2015-12-31: qty 5

## 2015-12-31 MED ORDER — LORATADINE 10 MG PO TABS
10.0000 mg | ORAL_TABLET | Freq: Every day | ORAL | Status: DC
  Administered 2015-12-31 – 2016-01-06 (×7): 10 mg via ORAL
  Filled 2015-12-31 (×7): qty 1

## 2015-12-31 MED ORDER — SODIUM POLYSTYRENE SULFONATE 15 GM/60ML PO SUSP
30.0000 g | Freq: Once | ORAL | Status: AC
  Administered 2015-12-31: 30 g via ORAL
  Filled 2015-12-31: qty 120

## 2015-12-31 MED ORDER — METHYLPREDNISOLONE SODIUM SUCC 125 MG IJ SOLR
60.0000 mg | Freq: Three times a day (TID) | INTRAMUSCULAR | Status: AC
  Administered 2015-12-31 – 2016-01-04 (×14): 60 mg via INTRAVENOUS
  Filled 2015-12-31 (×14): qty 2

## 2015-12-31 MED ORDER — ACETAMINOPHEN 325 MG PO TABS: 650.0000 mg | ORAL_TABLET | Freq: Four times a day (QID) | ORAL | Status: DC | PRN

## 2015-12-31 MED ORDER — SODIUM CHLORIDE 0.9 % IV SOLN
INTRAVENOUS | Status: DC
  Administered 2015-12-31: 10:00:00 via INTRAVENOUS

## 2015-12-31 MED ORDER — IPRATROPIUM-ALBUTEROL 0.5-2.5 (3) MG/3ML IN SOLN
3.0000 mL | Freq: Four times a day (QID) | RESPIRATORY_TRACT | Status: DC
  Administered 2015-12-31 – 2016-01-03 (×10): 3 mL via RESPIRATORY_TRACT
  Filled 2015-12-31 (×5): qty 3
  Filled 2015-12-31: qty 36
  Filled 2015-12-31 (×6): qty 3

## 2015-12-31 MED ORDER — BUDESONIDE 0.5 MG/2ML IN SUSP
0.5000 mg | Freq: Two times a day (BID) | RESPIRATORY_TRACT | Status: DC
  Administered 2015-12-31 – 2016-01-01 (×3): 0.5 mg via RESPIRATORY_TRACT
  Filled 2015-12-31 (×3): qty 2

## 2015-12-31 MED ORDER — FUROSEMIDE 40 MG PO TABS
20.0000 mg | ORAL_TABLET | Freq: Every day | ORAL | Status: DC
  Administered 2015-12-31 – 2016-01-06 (×7): 20 mg via ORAL
  Filled 2015-12-31 (×7): qty 1

## 2015-12-31 MED ORDER — ASPIRIN 325 MG PO TABS
325.0000 mg | ORAL_TABLET | Freq: Every day | ORAL | Status: DC
Start: 2015-12-31 — End: 2016-01-06
  Administered 2015-12-31 – 2016-01-06 (×7): 325 mg via ORAL
  Filled 2015-12-31 (×7): qty 1

## 2015-12-31 MED ORDER — ARFORMOTEROL TARTRATE 15 MCG/2ML IN NEBU
15.0000 ug | INHALATION_SOLUTION | Freq: Two times a day (BID) | RESPIRATORY_TRACT | Status: DC
Start: 2015-12-31 — End: 2016-01-01
  Administered 2015-12-31 – 2016-01-01 (×3): 15 ug via RESPIRATORY_TRACT
  Filled 2015-12-31 (×3): qty 2

## 2015-12-31 MED ORDER — ONDANSETRON HCL 4 MG/2ML IJ SOLN: 4.0000 mg | Freq: Four times a day (QID) | INTRAMUSCULAR | Status: DC | PRN

## 2015-12-31 MED ORDER — ALBUTEROL SULFATE (2.5 MG/3ML) 0.083% IN NEBU: 2.5000 mg | INHALATION_SOLUTION | Freq: Four times a day (QID) | RESPIRATORY_TRACT | Status: DC

## 2015-12-31 MED ORDER — ALBUTEROL SULFATE (2.5 MG/3ML) 0.083% IN NEBU
2.5000 mg | INHALATION_SOLUTION | Freq: Four times a day (QID) | RESPIRATORY_TRACT | Status: DC
  Administered 2015-12-31: 2.5 mg via RESPIRATORY_TRACT
  Filled 2015-12-31: qty 3

## 2015-12-31 MED ORDER — AMITRIPTYLINE HCL 25 MG PO TABS
25.0000 mg | ORAL_TABLET | Freq: Every day | ORAL | Status: DC
  Administered 2015-12-31 – 2016-01-05 (×6): 25 mg via ORAL
  Filled 2015-12-31 (×6): qty 1

## 2015-12-31 MED ORDER — FLUTICASONE PROPIONATE 50 MCG/ACT NA SUSP
2.0000 | Freq: Every day | NASAL | Status: DC
  Administered 2015-12-31 – 2016-01-06 (×7): 2 via NASAL
  Filled 2015-12-31: qty 16

## 2015-12-31 MED ORDER — ALBUTEROL SULFATE (2.5 MG/3ML) 0.083% IN NEBU
2.5000 mg | INHALATION_SOLUTION | RESPIRATORY_TRACT | Status: DC | PRN
  Administered 2016-01-01 – 2016-01-04 (×2): 2.5 mg via RESPIRATORY_TRACT
  Filled 2015-12-31 (×2): qty 3

## 2015-12-31 MED ORDER — ATORVASTATIN CALCIUM 80 MG PO TABS
80.0000 mg | ORAL_TABLET | Freq: Every day | ORAL | Status: DC
  Administered 2015-12-31 – 2016-01-05 (×6): 80 mg via ORAL
  Filled 2015-12-31 (×7): qty 1

## 2015-12-31 MED ORDER — ACETAMINOPHEN 650 MG RE SUPP: 650.0000 mg | Freq: Four times a day (QID) | RECTAL | Status: DC | PRN

## 2015-12-31 NOTE — Progress Notes (Addendum)
PATIENT DETAILS Name: [name] Age: 65 y.o. Sex: male Date of Birth: [birth year] Admit Date: 12/30/2015 Admitting Physician Norval Morton, MD [id number] Shawna Orleans, DO  Brief narrative: 65 year old male with history of severe COPD on home O2 mostly nocturnal, AAA followed at [facility], admitted with shortness of breath, likely secondary to acute exacerbation of COPD. See below for further details.  Subjective: Feels slightly better than on admission.  Assessment/Plan: Principal Problem: Acute on chronic hypoxic respiratory failure due to COPD exacerbation: Slightly better than on admission. Moving air, continues to have coarse expiratory rhonchi all over. Continue IV Solu-Medrol, scheduled bronchodilators. Add Flonase/oximetry metolazone nasal spray and Claritin has complains of some nasal congestion. Note-on nocturnal oxygen at home at 2 L/m. Will check influenza PCR. Will monitor closely.  Addendum 5 pm: Influenza B PCR positive-start Tamiflu  Active Problems: AAA: Known history of AAA being followed at [facility] vascular surgery. CT angiogram chest shows approximately 6.8 cm infrarenal AAA. Spoke with Dr. Rusty Aus discussed in detail, he advised no further workup while hospitalized-and suggested that patient needs to follow up with his primary vascular surgeon in [facility]. I have subsequently asked the patient to see if he can get a follow-up appointment with his vascular surgeon upon discharge. Per Dr. early, annual risk of rupture around 10-20%. Continue bisoprolol, will need tight blood pressure control. Denies tobacco abuse.  Mild hyperkalemia: One dose of Kayexalate, and recheck electrolytes in a.m.  History of chronic systolic CHF: most recent echo 3/21 showed significant improvement of EF to around 60-65%. Seems to be clinically compensated at this time, continue Lasix, bisoprolol, Imdur and hydralazine. Reviewed also recent outpatient cardiology note-not  on ACEI-due to development of ARF in the past.  Dyslipidemia: Continue statin  Chronic back pain: Chronic narcotics at home-continue  History of renal artery stenosis/carotid stenosis/left subclavian stenosis-AAA: CHRONIC ISSUES-continue aspirin and statin. Being followed by vascular surgery at [facility].  Disposition: Remain inpatient  Antimicrobial agents  See below  Anti-infectives    None      DVT Prophylaxis: Prophylactic Lovenox   Code Status: Full code   Family Communication None at bedside  Procedures: None  CONSULTS:  None  Time spent 30 minutes-Greater than 50% of this time was spent in counseling, explanation of diagnosis, planning of further management, and coordination of care.  MEDICATIONS: Scheduled Meds: . amitriptyline  25 mg Oral QHS  . antiseptic oral rinse  7 mL Mouth Rinse q12n4p  . arformoterol  15 mcg Nebulization BID  . aspirin  325 mg Oral Daily  . atorvastatin  80 mg Oral q1800  . bisoprolol  5 mg Oral Daily  . budesonide (PULMICORT) nebulizer solution  0.5 mg Nebulization BID  . enoxaparin (LOVENOX) injection  40 mg Subcutaneous Q24H  . fluticasone  2 spray Each Nare Daily  . furosemide  20 mg Oral Daily  . guaiFENesin  600 mg Oral BID  . hydrALAZINE  10 mg Oral TID  . ipratropium-albuterol  3 mL Nebulization Q6H  . isosorbide dinitrate  15 mg Oral Daily  . loratadine  10 mg Oral Daily  . methylPREDNISolone (SOLU-MEDROL) injection  60 mg Intravenous 3 times per day  . oxymetazoline  1 spray Each Nare BID  . roflumilast  500 mcg Oral Daily  . sodium chloride flush  3 mL Intravenous Q12H   Continuous Infusions: . sodium chloride 40 mL/hr at 12/31/15 [phone]  PRN Meds:.acetaminophen **OR** acetaminophen, albuterol, ALPRAZolam, HYDROcodone-homatropine, morphine, ondansetron **OR** ondansetron (ZOFRAN) IV    PHYSICAL EXAM: Vital signs in last 24 hours: Filed Vitals:   12/31/15 0200 12/31/15 0252 12/31/15 0400 12/31/15 0818  BP:  122/72 150/86 131/74   Pulse: 108 102 94   Temp:  97.6 F (36.4 C) 98 F (36.7 C)   TempSrc:  Oral Oral   Resp: 21 12 20    Height:   5\' 7"  (1.702 m)   Weight:   69.9 kg (154 lb 1.6 oz)   SpO2: 93% 99% 96% 93%    Weight change:  Filed Weights   12/31/15 0400  Weight: 69.9 kg (154 lb 1.6 oz)   Body mass index is 24.13 kg/(m^2).   Gen Exam: Awake and alert with clear speech.  Slightly tachypneic, but not in any distress. Neck: Supple, No JVD.   Chest: Moving air bilaterally-coarse rhonchi all over.  CVS: S1 S2 Regular, no murmurs.  Abdomen: soft, BS +, non tender, non distended.  Extremities: no edema, lower extremities warm to touch. Neurologic: Non Focal.   Skin: No Rash.   Wounds: N/A.    Intake/Output from previous day:  Intake/Output Summary (Last 24 hours) at 12/31/15 1229 Last data filed at 12/31/15 0800  Gross per 24 hour  Intake 598.75 ml  Output    300 ml  Net 298.75 ml     LAB RESULTS: CBC  Recent Labs Lab 12/30/15 2349  WBC 5.5  HGB 12.9*  HCT 38.8*  PLT 154  MCV 91.1  MCH 30.3  MCHC 33.2  RDW 13.8  LYMPHSABS 0.7  MONOABS 0.5  EOSABS 0.0  BASOSABS 0.0    Chemistries   Recent Labs Lab 12/30/15 2349 12/31/15 0432  NA 140 138  K 5.3* 5.2*  CL 102 102  CO2 27 24  GLUCOSE 138* 172*  BUN 33* 34*  CREATININE 1.19 1.05  CALCIUM 9.1 9.2    CBG: No results for input(s): GLUCAP in the last 168 hours.  GFR Estimated Creatinine Clearance: 63.8 mL/min (by C-G formula based on Cr of 1.05).  Coagulation profile No results for input(s): INR, PROTIME in the last 168 hours.  Cardiac Enzymes No results for input(s): CKMB, TROPONINI, MYOGLOBIN in the last 168 hours.  Invalid input(s): CK  Invalid input(s): POCBNP  Recent Labs  12/30/15 2349  DDIMER 2.59*   No results for input(s): HGBA1C in the last 72 hours. No results for input(s): CHOL, HDL, LDLCALC, TRIG, CHOLHDL, LDLDIRECT in the last 72 hours. No results for input(s): TSH,  T4TOTAL, T3FREE, THYROIDAB in the last 72 hours.  Invalid input(s): FREET3 No results for input(s): VITAMINB12, FOLATE, FERRITIN, TIBC, IRON, RETICCTPCT in the last 72 hours. No results for input(s): LIPASE, AMYLASE in the last 72 hours.  Urine Studies No results for input(s): UHGB, CRYS in the last 72 hours.  Invalid input(s): UACOL, UAPR, USPG, UPH, UTP, UGL, UKET, UBIL, UNIT, UROB, ULEU, UEPI, UWBC, URBC, UBAC, CAST, UCOM, BILUA  MICROBIOLOGY: No results found for this or any previous visit (from the past 240 hour(s)).  RADIOLOGY STUDIES/RESULTS: Ct Angio Chest Pe W/cm &/or Wo Cm  12/31/2015  CLINICAL DATA:  65 year old male with shortness of breath and elevated D-dimer. EXAM: CT ANGIOGRAPHY CHEST WITH CONTRAST TECHNIQUE: Multidetector CT imaging of the chest was performed using the standard protocol during bolus administration of intravenous contrast. Multiplanar CT image reconstructions and MIPs were obtained to evaluate the vascular anatomy. CONTRAST:  100 cc Isovue 370 COMPARISON:  Chest radiograph dated 12/30/2015 FINDINGS: Emphysema. A 5 cm CT lingular subpleural blebs. No focal consolidation, pleural effusion, or pneumothorax. In the central airways are patent. There is severe atherosclerotic calcification of the thoracic aorta. The thoracic aorta is tortuous. There is a 4.3 cm aneurysmal dilatation of the ascending thoracic aorta. There is a partially thrombosed 6.8 cm infrarenal abdominal aortic aneurysm. This aneurysm is only partially visualized. CT of the abdomen and pelvis with contrast is recommended for complete evaluation of the abdominal aorta. There is partial occlusion with high-grade stenosis of the origin of the left renal artery. The left renal artery however remain patent. There is advanced atherosclerotic calcification of the celiac axis, and SMA. This vessels however remain patent. There is no dissection of the thoracic aorta. Evaluation of the pulmonary arteries is  limited due to suboptimal opacification of the pulmonary artery and timing of the contrast. No definite large central pulmonary artery embolus identified. Top-normal right hilar lymph node. There is no mediastinal adenopathy. No cardiomegaly or pericardial effusion. There is coronary vascular calcification. Esophagus is grossly unremarkable. There is no axillary adenopathy. The chest wall soft tissues appear unremarkable. There is extensive degenerative changes of the spine. No acute fracture. The visualized upper abdominal organs appear unremarkable. There is anterior displacement of the pancreas by the aortic aneurysm. Review of the MIP images confirms the above findings. IMPRESSION: Extensive atherosclerotic calcification of the aorta with a 4.3 cm ascending aortic aneurysm. Ascending thoracic aortic aneurysm. Recommend semi-annual imaging followup by CTA or MRA and referral to cardiothoracic surgery if not already obtained. This recommendation follows 2010 ACCF/AHA/AATS/ACR/ASA/SCA/SCAI/SIR/STS/SVM Guidelines for the Diagnosis and Management of Patients With Thoracic Aortic Disease. Circulation. 2010; 121: [id number] Partially visualized 6.8 cm infrarenal abdominal aortic aneurysm. Further evaluation with CT of the abdomen pelvis with contrast (or CTA) recommended for complete evaluation of the aortic aneurysm. Suboptimal visualization of the pulmonary vasculature. No definite large central pulmonary artery embolus identified. Emphysema.  No pneumothorax. Electronically Signed   By: Anner Crete M.D.   On: 12/31/2015 02:28   Dg Chest Port 1 View  12/31/2015  CLINICAL DATA:  Shortness of breath.  COPD. EXAM: PORTABLE CHEST 1 VIEW COMPARISON:  07/14/2015 chest radiograph. FINDINGS: Stable cardiomediastinal silhouette with normal heart size. No pneumothorax. No pleural effusion. Hyperinflated lungs and emphysema. No acute consolidative airspace disease. No pulmonary edema. IMPRESSION: Hyperinflated lungs and  emphysema, in keeping with the provided history of COPD. Otherwise no active cardiopulmonary disease. Electronically Signed   By: Ilona Sorrel M.D.   On: 12/31/2015 00:10    Oren Binet, MD  [facility] Pager:[phone]  If 7PM-7AM, please contact night-coverage [email or web address] Password [email or web address] 12/31/2015, 12:29 PM   LOS: 0 days
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
## 2016-01-01 DIAGNOSIS — J101 Influenza due to other identified influenza virus with other respiratory manifestations: Secondary | ICD-10-CM | POA: Diagnosis present

## 2016-01-01 LAB — BASIC METABOLIC PANEL
Anion gap: 11 (ref 5–15)
BUN: 32 mg/dL — AB (ref 6–20)
CO2: 30 mmol/L (ref 22–32)
Calcium: 9.2 mg/dL (ref 8.9–10.3)
Chloride: 100 mmol/L — ABNORMAL LOW (ref 101–111)
Creatinine, Ser: 1.19 mg/dL (ref 0.61–1.24)
GFR calc Af Amer: >60 mL/min (ref >60)
GLUCOSE: 149 mg/dL — AB (ref 65–99)
POTASSIUM: 4.1 mmol/L (ref 3.5–5.1)
Sodium: 141 mmol/L (ref 135–145)

## 2016-01-01 LAB — CBC
HCT: 38.1 % — ABNORMAL LOW (ref 39.0–52.0)
Hemoglobin: 12.5 g/dL — ABNORMAL LOW (ref 13.0–17.0)
MCH: 30.4 pg (ref 26.0–34.0)
MCHC: 32.8 g/dL (ref 30.0–36.0)
MCV: 92.7 fL (ref 78.0–100.0)
PLATELETS: 149 10*3/uL — AB (ref 150–400)
RBC: 4.11 MIL/uL — ABNORMAL LOW (ref 4.22–5.81)
RDW: 13.7 % (ref 11.5–15.5)
WBC: 6.8 10*3/uL (ref 4.0–10.5)

## 2016-01-01 MED ORDER — MORPHINE SULFATE (PF) 2 MG/ML IV SOLN
2.0000 mg | INTRAVENOUS | Status: DC | PRN
  Administered 2016-01-01 – 2016-01-06 (×19): 2 mg via INTRAVENOUS
  Filled 2016-01-01 (×19): qty 1

## 2016-01-01 MED ORDER — MORPHINE SULFATE 15 MG PO TABS
15.0000 mg | ORAL_TABLET | Freq: Three times a day (TID) | ORAL | Status: DC | PRN
  Administered 2016-01-01 – 2016-01-06 (×12): 15 mg via ORAL
  Filled 2016-01-01 (×13): qty 1

## 2016-01-01 NOTE — Plan of Care (Signed)
Problem: Education: Goal: Knowledge of [facility] Education information/materials will improve Outcome: Progressing .  Problem: Safety: Goal: Ability to remain free from injury will improve Outcome: Adequate for Discharge Pt able to perform ADL's safely.  Ambulates with steady gait.    Problem: Health Behavior/Discharge Planning: Goal: Ability to manage health-related needs will improve Outcome: Progressing .  Problem: Pain Managment: Goal: General experience of comfort will improve Outcome: Progressing Pt with chronic back.  He states pain is 9/10 constant aching to back.  Taking PO and IV morphine.  Pain level decreased to 5/10.  Will continue with plan of care and continue to monitor.    Problem: Physical Regulation: Goal: Ability to maintain clinical measurements within normal limits will improve Outcome: Progressing BP 136/79 mmHg  Pulse 69  Temp(Src) 97.9 F (36.6 C) (Oral)  Resp 16  Ht 5' 7"  (1.702 m)  Wt 69.9 kg (154 lb 1.6 oz)  BMI 24.13 kg/m2  SpO2 98%    Goal: Will remain free from infection Outcome: Progressing WBC 6.8.  Afebrile.    Problem: Skin Integrity: Goal: Risk for impaired skin integrity will decrease Outcome: Progressing .  Problem: Tissue Perfusion: Goal: Risk factors for ineffective tissue perfusion will decrease Outcome: Progressing .  Problem: Activity: Goal: Risk for activity intolerance will decrease Outcome: Progressing .  Problem: Fluid Volume: Goal: Ability to maintain a balanced intake and output will improve Outcome: Progressing .  Problem: Nutrition: Goal: Adequate nutrition will be maintained Outcome: Completed/Met Date Met:  01/01/16 .  Problem: Bowel/Gastric: Goal: Will not experience complications related to bowel motility Outcome: Completed/Met Date Met:  01/01/16 .  Comments:

## 2016-01-01 NOTE — Progress Notes (Signed)
PROGRESS NOTE  [name] [id number] DOB: 1951-08-22 DOA: 12/30/2015 PCP: Drema Pry, DO  Summary: 79yom PMH COPD nocturnal oxygen presented with SOB. Admitted for COPD exacerbation.  Assessment/Plan: 1. Acute on chronic hypoxic respiratory failure secondary to COPD exacerbation and influenza. 2. Influenza B 3. COPD exacerbation, slow to improve 4. COPD on nocturnal oxygen. 5. Hyperkalemia. Resolved.  6. PMH CAD, ischemic cardiomyopathy 7. Extensive atherosclerotic calcification of the aorta with a 4.3 cm ascending aortic aneurysm and partially visualized 6.8 cm infrarenal abdominal aortic aneurysm. Followed by vascular surgerya t [facility]. Dr. Sloan Leiter d/w with Dr. Donnetta Hutching here--recommended outpatient follow-up.   Appears stable, still SOB  Continue steroids, nebs, oxygen, Tamiflu  Code Status: full code DVT prophylaxis: Lovenox Family Communication: wife at bedside Disposition Plan: home  Murray Hodgkins, MD  [facility] Direct contact:  --[place]ia [place]  --[email or web address]; password [email or web address] and click  [email or web address] contact night coverage as above 01/01/2016, 1:27 PM  LOS: 1 day   Consultants:    Procedures:    Antibiotics:    HPI/Subjective: Feels a little better. Very DOE which is not normal.  Objective: Filed Vitals:   12/31/15 2142 01/01/16 0544 01/01/16 0915 01/01/16 1050  BP: 156/75 141/65 136/79   Pulse:  73  69  Temp: 98.1 F (36.7 C) 97.9 F (36.6 C)    TempSrc: Oral Oral    Resp: 20 20  16   Height:      Weight:      SpO2: 96% 97%  98%    Intake/Output Summary (Last 24 hours) at 01/01/16 1327 Last data filed at 12/31/15 1745  Gross per 24 hour  Intake    480 ml  Output      0 ml  Net    480 ml     Filed Weights   12/31/15 0400  Weight: 69.9 kg (154 lb 1.6 oz)    Exam: Constitutional:  . Appears calm and mildly uncomfortable, not toxic ENMT:  . grossly normal hearing Respiratory:  . Poor air flow, diminished breath sounds,  bilateral wheezes. Speaks in full sentences. Marland Kitchen Respiratory effort mild to moderately increased. No retractions or accessory muscle use Cardiovascular:  . RRR, no m/r/g . No LE extremity edema   Psychiatric:  . judgement and insight appear normal . Mental status o Mood, affect appropriate  New data reviewed:  Potassium 4.1  Hgb stable 12.5, normal WBC  Influenza B positive  Scheduled Meds: . amitriptyline  25 mg Oral QHS  . antiseptic oral rinse  7 mL Mouth Rinse q12n4p  . aspirin  325 mg Oral Daily  . atorvastatin  80 mg Oral q1800  . bisoprolol  5 mg Oral Daily  . enoxaparin (LOVENOX) injection  40 mg Subcutaneous Q24H  . fluticasone  2 spray Each Nare Daily  . furosemide  20 mg Oral Daily  . guaiFENesin  600 mg Oral BID  . hydrALAZINE  10 mg Oral TID  . ipratropium-albuterol  3 mL Nebulization Q6H  . isosorbide dinitrate  15 mg Oral Daily  . loratadine  10 mg Oral Daily  . methylPREDNISolone (SOLU-MEDROL) injection  60 mg Intravenous 3 times per day  . oseltamivir  75 mg Oral BID  . oxymetazoline  1 spray Each Nare BID  . polyethylene glycol  17 g Oral Daily  . roflumilast  500 mcg Oral Daily  . sodium chloride flush  3 mL Intravenous Q12H   Continuous Infusions:   Principal Problem:   COPD exacerbation (HCC)  Active Problems:   Abdominal aortic aneurysm (HCC)   Aneurysm of thoracic aorta (HCC)   Acute respiratory failure with hypoxia (HCC)   Influenza B   Time spent 20 minutes

## 2016-01-02 DIAGNOSIS — I255 Ischemic cardiomyopathy: Secondary | ICD-10-CM | POA: Diagnosis present

## 2016-01-02 DIAGNOSIS — J9621 Acute and chronic respiratory failure with hypoxia: Secondary | ICD-10-CM | POA: Diagnosis present

## 2016-01-02 DIAGNOSIS — I712 Thoracic aortic aneurysm, without rupture: Secondary | ICD-10-CM

## 2016-01-02 MED ORDER — BUDESONIDE 0.25 MG/2ML IN SUSP
0.2500 mg | Freq: Two times a day (BID) | RESPIRATORY_TRACT | Status: DC
  Administered 2016-01-02 – 2016-01-04 (×5): 0.25 mg via RESPIRATORY_TRACT
  Filled 2016-01-02 (×5): qty 2

## 2016-01-02 NOTE — Progress Notes (Signed)
PROGRESS NOTE  [name] [id number] DOB: 07/13/1951 DOA: 12/30/2015 PCP: Drema Pry, DO  Brief History 65 year old male with a history of COPD on nocturnal oxygen, [facility], coronary artery disease chronic systolic CHF, hyperlipidemia, left subclavian stenosis, carotid stenosis, renal artery stenosis presented with 4 day history of worsening shortness of breath. He followed up with his pulmonologist on 12/28/2015 and was given a prescription for prednisone taper and azithromycin. There was no improvement. As a result the patient presented to the emergency department. The patient was started on intravenous Solu-Medrol, bronchodilators. He was found to have influenza and started on oseltamivir. Unfortunately, he has been slow to improve.  Assessment/Plan: Acute on chronic respiratory failure with hypoxia -secondary to COPD exacerbation -Slow improvement in the setting of influenza -The patient uses nocturnal oxygen at home, 2 L -Presently stable on nasal cannula  COPD exacerbation -restart Pulmicort -Continue intravenous Solu-Medrol -Continue duo nebs -Continue Daliresp -Wean oxygen for oxygen saturation 88-92 percent -pulmonary hygiene  Influenza -Oseltamivir started evening 12/31/2015  AAA -CT angiogram chest negative for pulmonary embolus but revealed 6.8 cm AAA -Optimize blood pressure -Dr. Sloan Leiter spoke with Dr. Wadie Lessen BP, follow up at [facility], monitor clinically for now  Chronic systolic and diastolic CHF (currently HFpEF) -12/21/2015 echocardiogram EF 60-65%, grade 1 DD -reviously had suppressed EF in June 2015, 30-35 percent -Presently euvolemic -continue bisoprolol and furosemide  Ischemic cardiomyopathy/coronary artery disease -Cath 11/12 revealed normal LM.  -proximal LAD just beyond the ostium of this diagonal had a long 90% stenosis with TIMI 2 flow.   -No disease in the Lcx. 50% mid RCA stenosis -failed PCI of  LAD -Continue aspirin -Continue Lipitor -No chest pain presently -continue Isordil, hydralazine  Renal artery stenosis/subclavian stenosis/carotid stenosis -CHRONIC ISSUES-continue aspirin and statin. Being followed by vascular surgery at [facility].  Hyperkalemia -resolved after Kayexalate  Hypertension -Continue bisoprolol, Isordil, hydralazine    Family Communication:   Pt at beside Disposition Plan:   Home 2-3 days       Procedures/Studies: Ct Angio Chest Pe W/cm &/or Wo Cm  12/31/2015  CLINICAL DATA:  65 year old male with shortness of breath and elevated D-dimer. EXAM: CT ANGIOGRAPHY CHEST WITH CONTRAST TECHNIQUE: Multidetector CT imaging of the chest was performed using the standard protocol during bolus administration of intravenous contrast. Multiplanar CT image reconstructions and MIPs were obtained to evaluate the vascular anatomy. CONTRAST:  100 cc Isovue 370 COMPARISON:  Chest radiograph dated 12/30/2015 FINDINGS: Emphysema. A 5 cm CT lingular subpleural blebs. No focal consolidation, pleural effusion, or pneumothorax. In the central airways are patent. There is severe atherosclerotic calcification of the thoracic aorta. The thoracic aorta is tortuous. There is a 4.3 cm aneurysmal dilatation of the ascending thoracic aorta. There is a partially thrombosed 6.8 cm infrarenal abdominal aortic aneurysm. This aneurysm is only partially visualized. CT of the abdomen and pelvis with contrast is recommended for complete evaluation of the abdominal aorta. There is partial occlusion with high-grade stenosis of the origin of the left renal artery. The left renal artery however remain patent. There is advanced atherosclerotic calcification of the celiac axis, and SMA. This vessels however remain patent. There is no dissection of the thoracic aorta. Evaluation of the pulmonary arteries is limited due to suboptimal opacification of the pulmonary artery and timing of the contrast. No definite large  central pulmonary artery embolus identified. Top-normal right hilar lymph node. There is no mediastinal adenopathy. No cardiomegaly or  pericardial effusion. There is coronary vascular calcification. Esophagus is grossly unremarkable. There is no axillary adenopathy. The chest wall soft tissues appear unremarkable. There is extensive degenerative changes of the spine. No acute fracture. The visualized upper abdominal organs appear unremarkable. There is anterior displacement of the pancreas by the aortic aneurysm. Review of the MIP images confirms the above findings. IMPRESSION: Extensive atherosclerotic calcification of the aorta with a 4.3 cm ascending aortic aneurysm. Ascending thoracic aortic aneurysm. Recommend semi-annual imaging followup by CTA or MRA and referral to cardiothoracic surgery if not already obtained. This recommendation follows 2010 ACCF/AHA/AATS/ACR/ASA/SCA/SCAI/SIR/STS/SVM Guidelines for the Diagnosis and Management of Patients With Thoracic Aortic Disease. Circulation. 2010; 121: [id number] Partially visualized 6.8 cm infrarenal abdominal aortic aneurysm. Further evaluation with CT of the abdomen pelvis with contrast (or CTA) recommended for complete evaluation of the aortic aneurysm. Suboptimal visualization of the pulmonary vasculature. No definite large central pulmonary artery embolus identified. Emphysema.  No pneumothorax. Electronically Signed   By: Anner Crete M.D.   On: 12/31/2015 02:28   Dg Chest Port 1 View  12/31/2015  CLINICAL DATA:  Shortness of breath.  COPD. EXAM: PORTABLE CHEST 1 VIEW COMPARISON:  07/14/2015 chest radiograph. FINDINGS: Stable cardiomediastinal silhouette with normal heart size. No pneumothorax. No pleural effusion. Hyperinflated lungs and emphysema. No acute consolidative airspace disease. No pulmonary edema. IMPRESSION: Hyperinflated lungs and emphysema, in keeping with the provided history of COPD. Otherwise no active cardiopulmonary disease.  Electronically Signed   By: Ilona Sorrel M.D.   On: 12/31/2015 00:10         Subjective: Patient feels a little short of breath this morning, but overall remains better than they have admission. Denies any fevers, chills, chest pain, nausea, vomiting, diarrhea, abdominal pain. Denies any headache or neck pain. No dysuria or hematuria. No hematochezia or melena.  Objective: Filed Vitals:   01/01/16 1538 01/01/16 2025 01/01/16 2037 01/02/16 0500  BP: 123/79  158/62 163/74  Pulse: 67  69 62  Temp: 97.5 F (36.4 C)  97.6 F (36.4 C) 97.5 F (36.4 C)  TempSrc: Oral  Oral Oral  Resp: 20  18 18   Height:      Weight:      SpO2: 100% 98% 98% 100%    Intake/Output Summary (Last 24 hours) at 01/02/16 0805 Last data filed at 01/02/16 0610  Gross per 24 hour  Intake    720 ml  Output    750 ml  Net    -30 ml   Weight change:  Exam:   General:  Pt is alert, follows commands appropriately, not in acute distress  HEENT: No icterus, No thrush, No neck mass, /AT  Cardiovascular: RRR, S1/S2, no rubs, no gallops  Respiratory: bilateral scattered rhonchi and expiratory wheezes. Moving good air.  Abdomen: Soft/+BS, non tender, non distended, no guarding; no hepatosplenomegaly  Extremities: No edema, No lymphangitis, No petechiae, No rashes, no synovitis; no cyanosis  Data Reviewed: Basic Metabolic Panel:  Recent Labs Lab 12/30/15 2349 12/31/15 0432 01/01/16 0459  NA 140 138 141  K 5.3* 5.2* 4.1  CL 102 102 100*  CO2 27 24 30   GLUCOSE 138* 172* 149*  BUN 33* 34* 32*  CREATININE 1.19 1.05 1.19  CALCIUM 9.1 9.2 9.2   Liver Function Tests:  Recent Labs Lab 12/30/15 2349  AST 34  ALT 29  ALKPHOS 126  BILITOT 0.3  PROT 7.6  ALBUMIN 4.2   No results for input(s): LIPASE, AMYLASE in the last 168 hours.  No results for input(s): AMMONIA in the last 168 hours. CBC:  Recent Labs Lab 12/30/15 2349 01/01/16 0459  WBC 5.5 6.8  NEUTROABS 4.3  --   HGB 12.9* 12.5*    HCT 38.8* 38.1*  MCV 91.1 92.7  PLT 154 149*   Cardiac Enzymes: No results for input(s): CKTOTAL, CKMB, CKMBINDEX, TROPONINI in the last 168 hours. BNP: Invalid input(s): POCBNP CBG: No results for input(s): GLUCAP in the last 168 hours.  No results found for this or any previous visit (from the past 240 hour(s)).   Scheduled Meds: . amitriptyline  25 mg Oral QHS  . antiseptic oral rinse  7 mL Mouth Rinse q12n4p  . aspirin  325 mg Oral Daily  . atorvastatin  80 mg Oral q1800  . bisoprolol  5 mg Oral Daily  . enoxaparin (LOVENOX) injection  40 mg Subcutaneous Q24H  . fluticasone  2 spray Each Nare Daily  . furosemide  20 mg Oral Daily  . guaiFENesin  600 mg Oral BID  . hydrALAZINE  10 mg Oral TID  . ipratropium-albuterol  3 mL Nebulization Q6H  . isosorbide dinitrate  15 mg Oral Daily  . loratadine  10 mg Oral Daily  . methylPREDNISolone (SOLU-MEDROL) injection  60 mg Intravenous 3 times per day  . oseltamivir  75 mg Oral BID  . oxymetazoline  1 spray Each Nare BID  . polyethylene glycol  17 g Oral Daily  . roflumilast  500 mcg Oral Daily  . sodium chloride flush  3 mL Intravenous Q12H   Continuous Infusions:    Tinamarie Przybylski, DO  [facility] Pager [phone]  If 7PM-7AM, please contact night-coverage [email or web address] Password [email or web address] 01/02/2016, 8:05 AM   LOS: 2 days

## 2016-01-03 ENCOUNTER — Other Ambulatory Visit: Payer: Self-pay | Admitting: *Deleted

## 2016-01-03 DIAGNOSIS — I255 Ischemic cardiomyopathy: Secondary | ICD-10-CM

## 2016-01-03 LAB — BASIC METABOLIC PANEL
ANION GAP: 11 (ref 5–15)
BUN: 33 mg/dL — ABNORMAL HIGH (ref 6–20)
CALCIUM: 9.1 mg/dL (ref 8.9–10.3)
CO2: 27 mmol/L (ref 22–32)
Chloride: 102 mmol/L (ref 101–111)
Creatinine, Ser: 1.02 mg/dL (ref 0.61–1.24)
Glucose, Bld: 138 mg/dL — ABNORMAL HIGH (ref 65–99)
Potassium: 4.3 mmol/L (ref 3.5–5.1)
SODIUM: 140 mmol/L (ref 135–145)

## 2016-01-03 LAB — MAGNESIUM: MAGNESIUM: 2 mg/dL (ref 1.7–2.4)

## 2016-01-03 LAB — PHOSPHORUS: PHOSPHORUS: 3.1 mg/dL (ref 2.5–4.6)

## 2016-01-03 MED ORDER — ARFORMOTEROL TARTRATE 15 MCG/2ML IN NEBU
15.0000 ug | INHALATION_SOLUTION | Freq: Two times a day (BID) | RESPIRATORY_TRACT | Status: DC
  Administered 2016-01-04 – 2016-01-06 (×5): 15 ug via RESPIRATORY_TRACT
  Filled 2016-01-03 (×5): qty 2

## 2016-01-03 MED ORDER — IPRATROPIUM BROMIDE 0.02 % IN SOLN
0.5000 mg | Freq: Four times a day (QID) | RESPIRATORY_TRACT | Status: DC
  Administered 2016-01-03 – 2016-01-04 (×2): 0.5 mg via RESPIRATORY_TRACT
  Filled 2016-01-03 (×2): qty 2.5

## 2016-01-03 NOTE — Progress Notes (Signed)
PROGRESS NOTE  [name] [id number] DOB: 1951-09-18 DOA: 12/30/2015 PCP: Drema Pry, DO  Brief History 65 year old male with a history of COPD on nocturnal oxygen, AAAfollow[facility] at [facility], coronary artery disease chronic systolic CHF, hyperlipidemia, left subclavian stenosis, carotid stenosis, renal artery stenosis presented with 4 day history of worsening shortness of breath. He followed up with his pulmonologist on 12/28/2015 and was given a prescription for prednisone taper and azithromycin. There was no improvement. As a result the patient presented to the emergency department. The patient was started on intravenous Solu-Medrol, bronchodilators. He was found to have influenza and started on oseltamivir. Unfortunately, he has been slow to improve.  Assessment/Plan: Acute on chronic respiratory failure with hypoxia -secondary to COPD exacerbation -Slow improvement in the setting of influenza -The patient uses nocturnal oxygen at home, 2 L -Presently stable on nasal cannula  COPD exacerbation -restart Pulmicort -Continue intravenous Solu-Medrol -Continue atrovent -Continue Daliresp -Wean oxygen for oxygen saturation 88-92 percent -pulmonary hygiene -slow to improve in part due to influenza -start brovana  Influenza -Oseltamivir started evening 12/31/2015  AAA -CT angiogram chest negative for pulmonary embolus but revealed 6.8 cm AAA -Optimize blood pressure -Dr. Sloan Leiter spoke with Dr. Wadie Lessen BP, follow up at [facility], monitor clinically for now  Chronic systolic and diastolic CHF (currently HFpEF) -12/21/2015 echocardiogram EF 60-65%, grade 1 DD -reviously had suppressed EF in June 2015, 30-35 percent -Presently euvolemic -continue bisoprolol and furosemide  Ischemic cardiomyopathy/coronary artery disease -Cath 11/12 revealed normal LM.  -proximal LAD just beyond the ostium of this diagonal had a long 90% stenosis with TIMI 2 flow.  -No  disease in the Lcx. 50% mid RCA stenosis -failed PCI of LAD -Continue aspirin -Continue Lipitor -No chest pain presently -continue Isordil, hydralazine  Renal artery stenosis/subclavian stenosis/carotid stenosis -CHRONIC ISSUES-continue aspirin and statin. Being followed by vascular surgery at [facility].  Hyperkalemia -resolved after Kayexalate  Hypertension -Continue bisoprolol, Isordil, hydralazine    Family Communication: Pt at beside Disposition Plan: Home 2-3 days   Procedures/Studies: Ct Angio Chest Pe W/cm &/or Wo Cm  12/31/2015  CLINICAL DATA:  65 year old male with shortness of breath and elevated D-dimer. EXAM: CT ANGIOGRAPHY CHEST WITH CONTRAST TECHNIQUE: Multidetector CT imaging of the chest was performed using the standard protocol during bolus administration of intravenous contrast. Multiplanar CT image reconstructions and MIPs were obtained to evaluate the vascular anatomy. CONTRAST:  100 cc Isovue 370 COMPARISON:  Chest radiograph dated 12/30/2015 FINDINGS: Emphysema. A 5 cm CT lingular subpleural blebs. No focal consolidation, pleural effusion, or pneumothorax. In the central airways are patent. There is severe atherosclerotic calcification of the thoracic aorta. The thoracic aorta is tortuous. There is a 4.3 cm aneurysmal dilatation of the ascending thoracic aorta. There is a partially thrombosed 6.8 cm infrarenal abdominal aortic aneurysm. This aneurysm is only partially visualized. CT of the abdomen and pelvis with contrast is recommended for complete evaluation of the abdominal aorta. There is partial occlusion with high-grade stenosis of the origin of the left renal artery. The left renal artery however remain patent. There is advanced atherosclerotic calcification of the celiac axis, and SMA. This vessels however remain patent. There is no dissection of the thoracic aorta. Evaluation of the pulmonary arteries is limited due to suboptimal opacification of the pulmonary  artery and timing of the contrast. No definite large central pulmonary artery embolus identified. Top-normal right hilar lymph node. There is no mediastinal adenopathy. No cardiomegaly or  pericardial effusion. There is coronary vascular calcification. Esophagus is grossly unremarkable. There is no axillary adenopathy. The chest wall soft tissues appear unremarkable. There is extensive degenerative changes of the spine. No acute fracture. The visualized upper abdominal organs appear unremarkable. There is anterior displacement of the pancreas by the aortic aneurysm. Review of the MIP images confirms the above findings. IMPRESSION: Extensive atherosclerotic calcification of the aorta with a 4.3 cm ascending aortic aneurysm. Ascending thoracic aortic aneurysm. Recommend semi-annual imaging followup by CTA or MRA and referral to cardiothoracic surgery if not already obtained. This recommendation follows 2010 ACCF/AHA/AATS/ACR/ASA/SCA/SCAI/SIR/STS/SVM Guidelines for the Diagnosis and Management of Patients With Thoracic Aortic Disease. Circulation. 2010; 121: [id number] Partially visualized 6.8 cm infrarenal abdominal aortic aneurysm. Further evaluation with CT of the abdomen pelvis with contrast (or CTA) recommended for complete evaluation of the aortic aneurysm. Suboptimal visualization of the pulmonary vasculature. No definite large central pulmonary artery embolus identified. Emphysema.  No pneumothorax. Electronically Signed   By: Anner Crete M.D.   On: 12/31/2015 02:28   Dg Chest Port 1 View  12/31/2015  CLINICAL DATA:  Shortness of breath.  COPD. EXAM: PORTABLE CHEST 1 VIEW COMPARISON:  07/14/2015 chest radiograph. FINDINGS: Stable cardiomediastinal silhouette with normal heart size. No pneumothorax. No pleural effusion. Hyperinflated lungs and emphysema. No acute consolidative airspace disease. No pulmonary edema. IMPRESSION: Hyperinflated lungs and emphysema, in keeping with the provided history of COPD.  Otherwise no active cardiopulmonary disease. Electronically Signed   By: Ilona Sorrel M.D.   On: 12/31/2015 00:10         Subjective: Overall, she is breathing better but has very little pulmonary reserve. He is dyspneic with minimal exertion. Denies any fevers, chills, chest discomfort, nausea, vomiting, diarrhea, abdominal pain.  Objective: Filed Vitals:   01/02/16 2117 01/03/16 0532 01/03/16 0924 01/03/16 1442  BP: 117/80 120/80  126/78  Pulse: 83 67  67  Temp: 97.7 F (36.5 C) 98.2 F (36.8 C)  97.7 F (36.5 C)  TempSrc: Oral Oral  Oral  Resp: 18 19  20   Height:      Weight:      SpO2: 97% 100% 97% 100%    Intake/Output Summary (Last 24 hours) at 01/03/16 2014 Last data filed at 01/03/16 1914  Gross per 24 hour  Intake    240 ml  Output   1750 ml  Net  -1510 ml   Weight change:  Exam:   General:  Pt is alert, follows commands appropriately, not in acute distress  HEENT: No icterus, No thrush, No neck mass, [place]/AT  Cardiovascular: RRR, S1/S2, no rubs, no gallops  Respiratory: bibasilar rales. Bilateral wheeze. Good air movement  Abdomen: Soft/+BS, non tender, non distended, no guarding  Extremities: No edema, No lymphangitis, No petechiae, No rashes, no synovitis  Data Reviewed: Basic Metabolic Panel:  Recent Labs Lab 12/30/15 2349 12/31/15 0432 01/01/16 0459 01/03/16 0433  NA 140 138 141 140  K 5.3* 5.2* 4.1 4.3  CL 102 102 100* 102  CO2 27 24 30 27   GLUCOSE 138* 172* 149* 138*  BUN 33* 34* 32* 33*  CREATININE 1.19 1.05 1.19 1.02  CALCIUM 9.1 9.2 9.2 9.1  MG  --   --   --  2.0  PHOS  --   --   --  3.1   Liver Function Tests:  Recent Labs Lab 12/30/15 2349  AST 34  ALT 29  ALKPHOS 126  BILITOT 0.3  PROT 7.6  ALBUMIN 4.2  No results for input(s): LIPASE, AMYLASE in the last 168 hours. No results for input(s): AMMONIA in the last 168 hours. CBC:  Recent Labs Lab 12/30/15 2349 01/01/16 0459  WBC 5.5 6.8  NEUTROABS 4.3  --     HGB 12.9* 12.5*  HCT 38.8* 38.1*  MCV 91.1 92.7  PLT 154 149*   Cardiac Enzymes: No results for input(s): CKTOTAL, CKMB, CKMBINDEX, TROPONINI in the last 168 hours. BNP: Invalid input(s): POCBNP CBG: No results for input(s): GLUCAP in the last 168 hours.  No results found for this or any previous visit (from the past 240 hour(s)).   Scheduled Meds: . amitriptyline  25 mg Oral QHS  . antiseptic oral rinse  7 mL Mouth Rinse q12n4p  . aspirin  325 mg Oral Daily  . atorvastatin  80 mg Oral q1800  . bisoprolol  5 mg Oral Daily  . budesonide (PULMICORT) nebulizer solution  0.25 mg Nebulization BID  . enoxaparin (LOVENOX) injection  40 mg Subcutaneous Q24H  . fluticasone  2 spray Each Nare Daily  . furosemide  20 mg Oral Daily  . guaiFENesin  600 mg Oral BID  . hydrALAZINE  10 mg Oral TID  . ipratropium-albuterol  3 mL Nebulization Q6H  . isosorbide dinitrate  15 mg Oral Daily  . loratadine  10 mg Oral Daily  . methylPREDNISolone (SOLU-MEDROL) injection  60 mg Intravenous 3 times per day  . oseltamivir  75 mg Oral BID  . polyethylene glycol  17 g Oral Daily  . roflumilast  500 mcg Oral Daily  . sodium chloride flush  3 mL Intravenous Q12H   Continuous Infusions:    Dawanda Mapel, DO  [facility] Pager [phone]  If 7PM-7AM, please contact night-coverage [email or web address] Password [email or web address] 01/03/2016, 8:14 PM   LOS: 3 days

## 2016-01-03 NOTE — Care Management Important Message (Signed)
Important Message  Patient Details  Name: [name] MRN: [id number] Date of Birth: [birth year]   Medicare Important Message Given:  Yes    McGibboneyOletta Darter, RN 01/03/2016, 2:38 PM

## 2016-01-04 ENCOUNTER — Other Ambulatory Visit: Payer: Self-pay | Admitting: *Deleted

## 2016-01-04 ENCOUNTER — Encounter: Payer: Self-pay | Admitting: *Deleted

## 2016-01-04 MED ORDER — IPRATROPIUM-ALBUTEROL 0.5-2.5 (3) MG/3ML IN SOLN
3.0000 mL | Freq: Four times a day (QID) | RESPIRATORY_TRACT | Status: DC
  Administered 2016-01-04 – 2016-01-06 (×9): 3 mL via RESPIRATORY_TRACT
  Filled 2016-01-04 (×10): qty 3

## 2016-01-04 MED ORDER — METHYLPREDNISOLONE SODIUM SUCC 125 MG IJ SOLR
60.0000 mg | Freq: Two times a day (BID) | INTRAMUSCULAR | Status: DC
  Administered 2016-01-05 (×2): 60 mg via INTRAVENOUS
  Filled 2016-01-04 (×3): qty 2

## 2016-01-04 MED ORDER — BUDESONIDE 0.5 MG/2ML IN SUSP
0.5000 mg | Freq: Two times a day (BID) | RESPIRATORY_TRACT | Status: DC
Start: 2016-01-04 — End: 2016-01-06
  Administered 2016-01-04 – 2016-01-06 (×4): 0.5 mg via RESPIRATORY_TRACT
  Filled 2016-01-04 (×4): qty 2

## 2016-01-04 NOTE — Progress Notes (Signed)
PROGRESS NOTE  [name] [id number] DOB: 01/13/1951 DOA: 12/30/2015 PCP: Drema Pry, DO   Brief History 65 year old male with a history of COPD on nocturnal oxygen, AAA followed at [facility], coronary artery disease chronic systolic CHF, hyperlipidemia, left subclavian stenosis, carotid stenosis, renal artery stenosis presented with 4 day history of worsening shortness of breath. He followed up with his pulmonologist on 12/28/2015 and was given a prescription for prednisone taper and azithromycin. There was no improvement. As a result, the patient presented to the emergency department. The patient was started on intravenous Solu-Medrol, bronchodilators. He was found to have influenza and started on oseltamivir. Unfortunately, he has been slow to improve.  Assessment/Plan: Acute on chronic respiratory failure with hypoxia -secondary to COPD exacerbation -Slow improvement in the setting of influenza -The patient uses nocturnal oxygen at home, 2 L -Presently stable on 2L [place] nasal cannula  COPD exacerbation -restart Pulmicort-->increase to 0.5 mg -Continue intravenous Solu-Medrol-->decrease to bid on 01/05/16 -Continue atrovent -Continue Daliresp -Wean oxygen for oxygen saturation 88-92 percent -pulmonary hygiene -slow to improve in part due to influenza -started brovana 4/3  Influenza -Oseltamivir started evening 12/31/2015--finish D# 5 of 5  AAA -CT angiogram chest negative for pulmonary embolus but revealed 6.8 cm AAA -Optimize blood pressure -Dr. Sloan Leiter spoke with Dr. Wadie Lessen BP, follow up at [facility], monitor clinically for now  Chronic systolic and diastolic CHF (currently HFpEF) -12/21/2015 echocardiogram EF 60-65%, grade 1 DD -previously had suppressed EF in June 2015, 30-35 percent -Presently clinically euvolemic -continue bisoprolol and furosemide  Ischemic cardiomyopathy/coronary artery disease -Cath 11/12 revealed normal LM.  -proximal  LAD just beyond the ostium of this diagonal had a long 90% stenosis with TIMI 2 flow.  -No disease in the Lcx. 50% mid RCA stenosis -failed PCI of LAD -Continue aspirin -Continue Lipitor -No chest pain presently -continue Isordil, hydralazine  Renal artery stenosis/subclavian stenosis/carotid stenosis -CHRONIC ISSUES-continue aspirin and statin. Being followed by vascular surgery at [facility].  Hyperkalemia -resolved after Kayexalate  Hypertension -Continue bisoprolol, Isordil, hydralazine    Family Communication: Pt at beside Disposition Plan: Home 2-3 days--hopeful for 4/6 or 4/7 discharge     Procedures/Studies: Ct Angio Chest Pe W/cm &/or Wo Cm  12/31/2015  CLINICAL DATA:  65 year old male with shortness of breath and elevated D-dimer. EXAM: CT ANGIOGRAPHY CHEST WITH CONTRAST TECHNIQUE: Multidetector CT imaging of the chest was performed using the standard protocol during bolus administration of intravenous contrast. Multiplanar CT image reconstructions and MIPs were obtained to evaluate the vascular anatomy. CONTRAST:  100 cc Isovue 370 COMPARISON:  Chest radiograph dated 12/30/2015 FINDINGS: Emphysema. A 5 cm CT lingular subpleural blebs. No focal consolidation, pleural effusion, or pneumothorax. In the central airways are patent. There is severe atherosclerotic calcification of the thoracic aorta. The thoracic aorta is tortuous. There is a 4.3 cm aneurysmal dilatation of the ascending thoracic aorta. There is a partially thrombosed 6.8 cm infrarenal abdominal aortic aneurysm. This aneurysm is only partially visualized. CT of the abdomen and pelvis with contrast is recommended for complete evaluation of the abdominal aorta. There is partial occlusion with high-grade stenosis of the origin of the left renal artery. The left renal artery however remain patent. There is advanced atherosclerotic calcification of the celiac axis, and SMA. This vessels however remain patent. There is no  dissection of the thoracic aorta. Evaluation of the pulmonary arteries is limited due to suboptimal opacification of the pulmonary artery and timing  of the contrast. No definite large central pulmonary artery embolus identified. Top-normal right hilar lymph node. There is no mediastinal adenopathy. No cardiomegaly or pericardial effusion. There is coronary vascular calcification. Esophagus is grossly unremarkable. There is no axillary adenopathy. The chest wall soft tissues appear unremarkable. There is extensive degenerative changes of the spine. No acute fracture. The visualized upper abdominal organs appear unremarkable. There is anterior displacement of the pancreas by the aortic aneurysm. Review of the MIP images confirms the above findings. IMPRESSION: Extensive atherosclerotic calcification of the aorta with a 4.3 cm ascending aortic aneurysm. Ascending thoracic aortic aneurysm. Recommend semi-annual imaging followup by CTA or MRA and referral to cardiothoracic surgery if not already obtained. This recommendation follows 2010 ACCF/AHA/AATS/ACR/ASA/SCA/SCAI/SIR/STS/SVM Guidelines for the Diagnosis and Management of Patients With Thoracic Aortic Disease. Circulation. 2010; 121: [id number] Partially visualized 6.8 cm infrarenal abdominal aortic aneurysm. Further evaluation with CT of the abdomen pelvis with contrast (or CTA) recommended for complete evaluation of the aortic aneurysm. Suboptimal visualization of the pulmonary vasculature. No definite large central pulmonary artery embolus identified. Emphysema.  No pneumothorax. Electronically Signed   By: Anner Crete M.D.   On: 12/31/2015 02:28   Dg Chest Port 1 View  12/31/2015  CLINICAL DATA:  Shortness of breath.  COPD. EXAM: PORTABLE CHEST 1 VIEW COMPARISON:  07/14/2015 chest radiograph. FINDINGS: Stable cardiomediastinal silhouette with normal heart size. No pneumothorax. No pleural effusion. Hyperinflated lungs and emphysema. No acute consolidative  airspace disease. No pulmonary edema. IMPRESSION: Hyperinflated lungs and emphysema, in keeping with the provided history of COPD. Otherwise no active cardiopulmonary disease. Electronically Signed   By: Ilona Sorrel M.D.   On: 12/31/2015 00:10         Subjective: Overall, he feels like he is breathing better. But he is still having dyspnea with exertion. Denies fevers, chills, chest pain, shortness breath, nausea, vomiting, diarrhea, abdominal pain, dysuria, hematuria. No hematochezia or melena.  Objective: Filed Vitals:   01/04/16 0856 01/04/16 1039 01/04/16 1345 01/04/16 1549  BP:  116/71 119/76 120/80  Pulse:   62   Temp:   97.4 F (36.3 C)   TempSrc:   Oral   Resp:   18   Height:      Weight:      SpO2: 90%  97%     Intake/Output Summary (Last 24 hours) at 01/04/16 1633 Last data filed at 01/04/16 1530  Gross per 24 hour  Intake   1200 ml  Output   2425 ml  Net  -1225 ml   Weight change:  Exam:   General:  Pt is alert, follows commands appropriately, not in acute distress  HEENT: No icterus, No thrush, No neck mass, [place]/AT  Cardiovascular: RRR, S1/S2, no rubs, no gallops  Respiratory: bibasilar rales. Good air movement. Bibasilar wheezing.  Abdomen: Soft/+BS, non tender, non distended, no guarding; no hepatosplenomegaly  Extremities: No edema, No lymphangitis, No petechiae, No rashes, no synovitis  Data Reviewed: Basic Metabolic Panel:  Recent Labs Lab 12/30/15 2349 12/31/15 0432 01/01/16 0459 01/03/16 0433  NA 140 138 141 140  K 5.3* 5.2* 4.1 4.3  CL 102 102 100* 102  CO2 27 24 30 27   GLUCOSE 138* 172* 149* 138*  BUN 33* 34* 32* 33*  CREATININE 1.19 1.05 1.19 1.02  CALCIUM 9.1 9.2 9.2 9.1  MG  --   --   --  2.0  PHOS  --   --   --  3.1   Liver Function Tests:  Recent Labs Lab 12/30/15 2349  AST 34  ALT 29  ALKPHOS 126  BILITOT 0.3  PROT 7.6  ALBUMIN 4.2   No results for input(s): LIPASE, AMYLASE in the last 168 hours. No results  for input(s): AMMONIA in the last 168 hours. CBC:  Recent Labs Lab 12/30/15 2349 01/01/16 0459  WBC 5.5 6.8  NEUTROABS 4.3  --   HGB 12.9* 12.5*  HCT 38.8* 38.1*  MCV 91.1 92.7  PLT 154 149*   Cardiac Enzymes: No results for input(s): CKTOTAL, CKMB, CKMBINDEX, TROPONINI in the last 168 hours. BNP: Invalid input(s): POCBNP CBG: No results for input(s): GLUCAP in the last 168 hours.  No results found for this or any previous visit (from the past 240 hour(s)).   Scheduled Meds: . amitriptyline  25 mg Oral QHS  . antiseptic oral rinse  7 mL Mouth Rinse q12n4p  . arformoterol  15 mcg Nebulization BID  . aspirin  325 mg Oral Daily  . atorvastatin  80 mg Oral q1800  . bisoprolol  5 mg Oral Daily  . budesonide (PULMICORT) nebulizer solution  0.25 mg Nebulization BID  . enoxaparin (LOVENOX) injection  40 mg Subcutaneous Q24H  . fluticasone  2 spray Each Nare Daily  . furosemide  20 mg Oral Daily  . guaiFENesin  600 mg Oral BID  . hydrALAZINE  10 mg Oral TID  . ipratropium-albuterol  3 mL Nebulization Q6H  . isosorbide dinitrate  15 mg Oral Daily  . loratadine  10 mg Oral Daily  . methylPREDNISolone (SOLU-MEDROL) injection  60 mg Intravenous 3 times per day  . oseltamivir  75 mg Oral BID  . polyethylene glycol  17 g Oral Daily  . roflumilast  500 mcg Oral Daily  . sodium chloride flush  3 mL Intravenous Q12H   Continuous Infusions:    Tippi Mccrae, DO  [facility] Pager [phone]  If 7PM-7AM, please contact night-coverage [email or web address] Password [email or web address] 01/04/2016, 4:33 PM   LOS: 4 days

## 2016-01-04 NOTE — Consult Note (Signed)
   [facility] CM Inpatient Consult   01/04/2016  [name] [birth year] [id number]   Patient evaluated for long-term disease management services with [place] [facility] program. Martin Majestic to bedside to discuss and offer [place] [facility] services. Patient is agreeable and written consent signed. Explained to patient that he/she will receive post hospital transition of care calls and will be evaluated for monthly home visits. Confirmed Primary Care MD as Dr. Shawna Orleans .  Confirmed best contact number as [phone]. Explained that [place] [facility] will not interfere or replace any services that are arranged by inpatient RNCM.  Left [facility] Care Management packet and contact information at bedside. [name] indicates he lives with wife. He denies having issues with transportation or with affording medications. Will make inpatient RNCM aware that patient will be followed by [place] [facility] post hospital discharge.   Will request for patient to be assigned to [facility] for COPD disease and symptom management.     Marthenia Rolling, MSN-Ed, RN,BSN [facility] Liaison [phone]

## 2016-01-04 NOTE — Evaluation (Signed)
Physical Therapy One Time Evaluation Patient Details Name: [name] MRN: [id number] DOB: [birth year] Today's Date: 01/04/2016   History of Present Illness  65 year old male with a history of COPD on nocturnal oxygen, AAA, coronary artery disease, chronic systolic CHF, hyperlipidemia, left subclavian stenosis, carotid stenosis, renal artery stenosis and admitted with acute on chronic respiratory failure with hypoxia and COPD exacerbation  Clinical Impression  Patient evaluated by Physical Therapy with no further acute PT needs identified. All education has been completed and the patient has no further questions.  Pt mobilizing well however limited by mild SOB and requiring supplemental oxygen (see below).  Pt agreeable to ambulate with staff during acute stay and denied need for PT to return. See below for any follow-up Physical Therapy or equipment needs. PT is signing off. Thank you for this referral.  SATURATION QUALIFICATIONS: (This note is used to comply with regulatory documentation for home oxygen)  Patient Saturations on Room Air at Rest = 97%  Patient Saturations on Room Air while Ambulating = 87%  Patient Saturations on 2 Liters of oxygen while Ambulating = 93%  Please briefly explain why patient needs home oxygen: to maintain oxygen saturations above 88% during physical activity such as ambulation.     Follow Up Recommendations No PT follow up    Equipment Recommendations  None recommended by PT    Recommendations for Other Services       Precautions / Restrictions Precautions Precaution Comments: monitor sats      Mobility  Bed Mobility Overal bed mobility: Modified Independent                Transfers Overall transfer level: Modified independent                  Ambulation/Gait Ambulation/Gait assistance: Supervision;Modified independent (Device/Increase time) Ambulation Distance (Feet): 200 Feet Assistive device: None Gait  Pattern/deviations: WFL(Within Functional Limits)     General Gait Details: pt reports mild SOB, SpO2 in 90s until almost back to room dropped to 87% so 2L O2 [place] reapplied  Stairs            Wheelchair Mobility    Modified Rankin (Stroke Patients Only)       Balance                                             Pertinent Vitals/Pain Pain Assessment: No/denies pain    Home Living Family/patient expects to be discharged to:: Private residence Living Arrangements: Spouse/significant other     Home Access: Level entry     Home Layout: One level Home Equipment: None Additional Comments: uses O2 at night    Prior Function Level of Independence: Independent               Hand Dominance        Extremity/Trunk Assessment   Upper Extremity Assessment: Overall WFL for tasks assessed           Lower Extremity Assessment: Overall WFL for tasks assessed         Communication   Communication: No difficulties  Cognition Arousal/Alertness: Awake/alert Behavior During Therapy: WFL for tasks assessed/performed Overall Cognitive Status: Within Functional Limits for tasks assessed                      General Comments      Exercises  Assessment/Plan    PT Assessment Patent does not need any further PT services  PT Diagnosis Difficulty walking   PT Problem List    PT Treatment Interventions     PT Goals (Current goals can be found in the Care Plan section) Acute Rehab PT Goals PT Goal Formulation: All assessment and education complete, DC therapy    Frequency     Barriers to discharge        Co-evaluation               End of Session Equipment Utilized During Treatment: Oxygen Activity Tolerance: Patient tolerated treatment well Patient left: in bed;with call bell/phone within reach           Time: [id number] PT Time Calculation (min) (ACUTE ONLY): 11 min   Charges:   PT Evaluation $PT Eval  Low Complexity: 1 Procedure     PT G Codes:        Wilbur Oakland,KATHrine E 01/04/2016, 11:48 AM Carmelia Bake, PT, DPT 01/04/2016 Pager: [phone]

## 2016-01-04 NOTE — Plan of Care (Signed)
Problem: Pain Managment: Goal: General experience of comfort will improve Continue.  Chronic lower back pain up to 9/10.   Getting morphine IV and MSIR 15mg  prn.   Pain down to 6 at the lowest.  Continue with pain management.       Problem: Activity: Goal: Risk for activity intolerance will decrease Outcome: Progressing Improving slowly.  Problem: Fluid Volume: Goal: Ability to maintain a balanced intake and output will improve Outcome: Progressing .

## 2016-01-05 DIAGNOSIS — J441 Chronic obstructive pulmonary disease with (acute) exacerbation: Secondary | ICD-10-CM

## 2016-01-05 DIAGNOSIS — J101 Influenza due to other identified influenza virus with other respiratory manifestations: Principal | ICD-10-CM

## 2016-01-05 DIAGNOSIS — J9621 Acute and chronic respiratory failure with hypoxia: Secondary | ICD-10-CM

## 2016-01-05 DIAGNOSIS — I10 Essential (primary) hypertension: Secondary | ICD-10-CM

## 2016-01-05 DIAGNOSIS — I714 Abdominal aortic aneurysm, without rupture: Secondary | ICD-10-CM

## 2016-01-05 DIAGNOSIS — E875 Hyperkalemia: Secondary | ICD-10-CM

## 2016-01-05 LAB — CBC
HEMATOCRIT: 39.1 % (ref 39.0–52.0)
Hemoglobin: 13.2 g/dL (ref 13.0–17.0)
MCH: 30.5 pg (ref 26.0–34.0)
MCHC: 33.8 g/dL (ref 30.0–36.0)
MCV: 90.3 fL (ref 78.0–100.0)
Platelets: 187 10*3/uL (ref 150–400)
RBC: 4.33 MIL/uL (ref 4.22–5.81)
RDW: 13.3 % (ref 11.5–15.5)
WBC: 14.5 10*3/uL — ABNORMAL HIGH (ref 4.0–10.5)

## 2016-01-05 LAB — BASIC METABOLIC PANEL
Anion gap: 10 (ref 5–15)
BUN: 43 mg/dL — AB (ref 6–20)
CHLORIDE: 100 mmol/L — AB (ref 101–111)
CO2: 32 mmol/L (ref 22–32)
Calcium: 9.3 mg/dL (ref 8.9–10.3)
Creatinine, Ser: 1.19 mg/dL (ref 0.61–1.24)
GFR calc Af Amer: >60 mL/min (ref >60)
GFR calc non Af Amer: >60 mL/min (ref >60)
GLUCOSE: 143 mg/dL — AB (ref 65–99)
POTASSIUM: 5.5 mmol/L — AB (ref 3.5–5.1)
Sodium: 142 mmol/L (ref 135–145)

## 2016-01-05 LAB — POTASSIUM: Potassium: 5.3 mmol/L — ABNORMAL HIGH (ref 3.5–5.1)

## 2016-01-05 MED ORDER — GUAIFENESIN ER 600 MG PO TB12
1200.0000 mg | ORAL_TABLET | Freq: Two times a day (BID) | ORAL | Status: DC
  Administered 2016-01-05 – 2016-01-06 (×3): 1200 mg via ORAL
  Filled 2016-01-05 (×3): qty 2

## 2016-01-05 NOTE — Plan of Care (Signed)
Problem: Activity: Goal: Risk for activity intolerance will decrease Outcome: Progressing Independent with ADL's.   Steady gait.   Still dyspneic with exertion.   Remains on 2L oxygen via [place].    Problem: Fluid Volume: Goal: Ability to maintain a balanced intake and output will improve Outcome: Progressing .  Problem: Cardiac: Goal: Ability to achieve and maintain adequate cardiopulmonary perfusion will improve Outcome: Progressing .  Problem: Education: Goal: Ability to demonstrate managment of disease process will improve Outcome: Completed/Met Date Met:  01/05/16 . Goal: Ability to verbalize understanding of medication therapies will improve Outcome: Completed/Met Date Met:  01/05/16 .

## 2016-01-05 NOTE — Progress Notes (Addendum)
[facility] PROGRESS NOTE  [name] [id number] DOB: 1951-01-09 DOA: 12/30/2015 PCP: Drema Pry, DO  Brief History 65 year old male with a history of COPD on nocturnal oxygen, AAA followed at [facility] coronary artery disease chronic systolic CHF, hyperlipidemia, left subclavian stenosis, carotid stenosis, renal artery stenosis presented with 4 day history of worsening shortness of breath. He followed up with his pulmonologist on 12/28/2015 and was given a prescription for prednisone taper and azithromycin. There was no improvement. As a result, the patient presented to the emergency department. The patient was started on intravenous Solu-Medrol, bronchodilators. He was found to have influenza and started on oseltamivir. Unfortunately, he has been slow to improve.   Assessment/Plan: #1 acute on chronic hypoxic respiratory failure secondary to COPD exacerbation in the setting of influenza B. Patient with clinical improvement. Patient states shortness of breath has improved. Continue oxygen, Pulmicort, Brovana, IV steroid taper, scheduled nebulizers, Mucinex. Follow.  #2 acute COPD exacerbation Likely triggered by influenza B. Clinical improvement. Continue oxygen, nebulizers,dalirep, IV steroid taper, Mucinex. Follow.  #3 influenza B Status post 5 days of Tamiflu.  #4 hypertension Stable. Continue current regimen of hydralazine,isordil, bisoprolol.  #5 hyperlipidemia Fasting lipid panel from 09/01/2015 with a LDL of 36. Continue current statin.  #6 hyperkalemia Repeat potassium. If still elevated will give Kayexalate.  #7 AAA CT angiogram chest negative for PE but reveals a 6.8 cm AAA. Dr. Sloan Leiter spoke with Dr. Tawni Millers of vascular surgery who recommended optimizing blood pressure and patient to follow-up at [facility]. Continue statin. Continue risk factor modification.  #8 chronic systolic and diastolic CHF [id number] with echocardiogram with EF of 60-65% with grade 1  diastolic dysfunction. Previously suppressed EF in June 2015 was 30-35%. Currently euvolemic. Continue furosemide and bisoprolol, hydralazine, Isordil.  #9 ischemic cardiomyopathy/coronary artery disease Stable. Recent Lexiscan myocardial perfusion stress test done 12/14/2015 with a mildly decreased EF of 45-54%, distal inferior septal/apical akinesis. No ST segment deviation noted during stress test. Medium defect of severe severity present in the mid inferior, apical inferior and apical lateral location findings consistent with prior myocardial infarction with mild degree of peri-infarct ischemia. Intermediate risk study. Continue aspirin, Lipitor, Isordil, hydralazine, bisoprolol. Outpatient follow-up.  #10 renal artery stenosis/subclavian stenosis/carotid stenosis Chronic in nature. Continue risk factor modification. Continue aspirin and statin. Continue cardiac medications. Patient is followed by vascular surgery at [facility].   #11 prophylaxis Lovenox for DVT prophylaxis.  Code Status: Full Family Communication: Updated patient. No family at bedside. Disposition Plan: Hopefully home in the next 1-2 days.   Consultants:  None  Procedures:  Chest x-ray 12/31/2015  CT angiogram chest 12/31/2015  Antibiotics:  None  HPI/Subjective: Patient denies any chest pain. Patient states shortness of breath has improved. Patient with a productive cough. Patient feeling better.  Objective: Filed Vitals:   01/05/16 0512 01/05/16 1020  BP: 149/61 112/74  Pulse: 61   Temp: 98.1 F (36.7 C)   Resp: 18     Intake/Output Summary (Last 24 hours) at 01/05/16 1119 Last data filed at 01/05/16 0950  Gross per 24 hour  Intake    840 ml  Output   1500 ml  Net   -660 ml   Filed Weights   12/31/15 0400 01/04/16 0612 01/05/16 0512  Weight: 69.9 kg (154 lb 1.6 oz) 66.906 kg (147 lb 8 oz) 66.6 kg (146 lb 13.2 oz)    Exam:   General:  NAD  Cardiovascular: RRR  Respiratory: Poor to fair air  movement. No  crackles. Minimal wheezing.  Abdomen: Soft, nontender, nondistended, positive bowel sounds.  Musculoskeletal: No clubbing cyanosis or edema.  Data Reviewed: Basic Metabolic Panel:  Recent Labs Lab 12/30/15 2349 12/31/15 0432 01/01/16 0459 01/03/16 0433 01/05/16 0853  NA 140 138 141 140 142  K 5.3* 5.2* 4.1 4.3 5.5*  CL 102 102 100* 102 100*  CO2 27 24 30 27  32  GLUCOSE 138* 172* 149* 138* 143*  BUN 33* 34* 32* 33* 43*  CREATININE 1.19 1.05 1.19 1.02 1.19  CALCIUM 9.1 9.2 9.2 9.1 9.3  MG  --   --   --  2.0  --   PHOS  --   --   --  3.1  --    Liver Function Tests:  Recent Labs Lab 12/30/15 2349  AST 34  ALT 29  ALKPHOS 126  BILITOT 0.3  PROT 7.6  ALBUMIN 4.2   No results for input(s): LIPASE, AMYLASE in the last 168 hours. No results for input(s): AMMONIA in the last 168 hours. CBC:  Recent Labs Lab 12/30/15 2349 01/01/16 0459 01/05/16 0853  WBC 5.5 6.8 14.5*  NEUTROABS 4.3  --   --   HGB 12.9* 12.5* 13.2  HCT 38.8* 38.1* 39.1  MCV 91.1 92.7 90.3  PLT 154 149* 187   Cardiac Enzymes: No results for input(s): CKTOTAL, CKMB, CKMBINDEX, TROPONINI in the last 168 hours. BNP (last 3 results)  Recent Labs  01/10/15 1713 12/31/15 0432  BNP 223.6* 158.3*    ProBNP (last 3 results) No results for input(s): PROBNP in the last 8760 hours.  CBG: No results for input(s): GLUCAP in the last 168 hours.  No results found for this or any previous visit (from the past 240 hour(s)).   Studies: No results found.  Scheduled Meds: . amitriptyline  25 mg Oral QHS  . antiseptic oral rinse  7 mL Mouth Rinse q12n4p  . arformoterol  15 mcg Nebulization BID  . aspirin  325 mg Oral Daily  . atorvastatin  80 mg Oral q1800  . bisoprolol  5 mg Oral Daily  . budesonide (PULMICORT) nebulizer solution  0.5 mg Nebulization BID  . enoxaparin (LOVENOX) injection  40 mg Subcutaneous Q24H  . fluticasone  2 spray Each Nare Daily  . furosemide  20 mg Oral Daily   . guaiFENesin  1,200 mg Oral BID  . hydrALAZINE  10 mg Oral TID  . ipratropium-albuterol  3 mL Nebulization Q6H  . isosorbide dinitrate  15 mg Oral Daily  . loratadine  10 mg Oral Daily  . methylPREDNISolone (SOLU-MEDROL) injection  60 mg Intravenous Q12H  . polyethylene glycol  17 g Oral Daily  . roflumilast  500 mcg Oral Daily  . sodium chloride flush  3 mL Intravenous Q12H   Continuous Infusions:   Principal Problem:   Acute on chronic respiratory failure with hypoxia (HCC) Active Problems:   COPD exacerbation (HCC)   Influenza B   Essential hypertension   Abdominal aortic aneurysm (HCC)   Aneurysm of thoracic aorta (HCC)   Chronic LBP   Tobacco abuse   Cardiomyopathy, ischemic    Time spent: 38 minutes    THOMPSON,DANIEL M.D. [facility] Pager [phone]. If 7PM-7AM, please contact night-coverage at [email or web address], password [facility] 01/05/2016, 11:19 AM  LOS: 5 days

## 2016-01-05 NOTE — Progress Notes (Signed)
SATURATION QUALIFICATIONS: (This note is used to comply with regulatory documentation for home oxygen)  Patient Saturations on Room Air at Rest = 94%  Patient Saturations on Room Air while Ambulating = 88 %  Patient Saturations on 2  Liters of oxygen while Ambulating = 93%  Please briefly explain why patient needs home oxygen: 

## 2016-01-05 NOTE — Progress Notes (Signed)
Pt had a 6 beat run of vtach at 0206, BP 103/76 HR 78, no symptoms. Notified K. Schorr via page.  Rosine Beat, RN

## 2016-01-06 LAB — CBC
HCT: 38.6 % — ABNORMAL LOW (ref 39.0–52.0)
Hemoglobin: 13.1 g/dL (ref 13.0–17.0)
MCH: 30.4 pg (ref 26.0–34.0)
MCHC: 33.9 g/dL (ref 30.0–36.0)
MCV: 89.6 fL (ref 78.0–100.0)
PLATELETS: 203 10*3/uL (ref 150–400)
RBC: 4.31 MIL/uL (ref 4.22–5.81)
RDW: 13.2 % (ref 11.5–15.5)
WBC: 14 10*3/uL — AB (ref 4.0–10.5)

## 2016-01-06 LAB — BASIC METABOLIC PANEL
ANION GAP: 8 (ref 5–15)
BUN: 45 mg/dL — ABNORMAL HIGH (ref 6–20)
CO2: 31 mmol/L (ref 22–32)
Calcium: 8.9 mg/dL (ref 8.9–10.3)
Chloride: 102 mmol/L (ref 101–111)
Creatinine, Ser: 1.11 mg/dL (ref 0.61–1.24)
GFR calc Af Amer: >60 mL/min (ref >60)
Glucose, Bld: 89 mg/dL (ref 65–99)
POTASSIUM: 4.2 mmol/L (ref 3.5–5.1)
SODIUM: 141 mmol/L (ref 135–145)

## 2016-01-06 MED ORDER — IPRATROPIUM-ALBUTEROL 0.5-2.5 (3) MG/3ML IN SOLN: 3.0000 mL | Freq: Four times a day (QID) | RESPIRATORY_TRACT | Status: DC

## 2016-01-06 MED ORDER — PREDNISONE 20 MG PO TABS: 60.0000 mg | ORAL_TABLET | Freq: Every day | ORAL | Status: DC

## 2016-01-06 MED ORDER — POLYETHYLENE GLYCOL 3350 17 G PO PACK
17.0000 g | PACK | Freq: Every day | ORAL | Status: DC
Start: 2016-01-06 — End: 2016-07-30

## 2016-01-06 MED ORDER — PREDNISONE 20 MG PO TABS
60.0000 mg | ORAL_TABLET | Freq: Every day | ORAL | Status: DC
  Administered 2016-01-06: 60 mg via ORAL
  Filled 2016-01-06: qty 3

## 2016-01-06 MED ORDER — TIOTROPIUM BROMIDE MONOHYDRATE 2.5 MCG/ACT IN AERS: INHALATION_SPRAY | RESPIRATORY_TRACT | Status: DC

## 2016-01-06 NOTE — Discharge Summary (Signed)
Physician Discharge Summary  [name] [id number] DOB: 1951-05-03 DOA: 12/30/2015  PCP: Drema Pry, DO  Admit date: 12/30/2015 Discharge date: 01/06/2016  Time spent: 65 minutes  Recommendations for Outpatient Follow-up:  1. Follow-up with Eric Form, PA pulmonary in 1 week. On follow-up patient's pulmonary status will need to be reassessed. 2. Follow-up with Drema Pry, DO in 2 weeks. On follow-up patient will need a basic metabolic profile done to follow-up on electrolytes and renal function. 3. Follow-up with vascular surgeon at [facility] in 1-2 weeks for follow-up on AAA.   Discharge Diagnoses:  Principal Problem:   Acute on chronic respiratory failure with hypoxia (HCC) Active Problems:   COPD exacerbation (HCC)   Influenza B   Essential hypertension   Abdominal aortic aneurysm (HCC)   Aneurysm of thoracic aorta (HCC)   Chronic LBP   Tobacco abuse   Cardiomyopathy, ischemic   Discharge Condition: Stable and improved  Diet recommendation: Heart healthy  Filed Weights   12/31/15 0400 01/04/16 0612 01/05/16 0512  Weight: 69.9 kg (154 lb 1.6 oz) 66.906 kg (147 lb 8 oz) 66.6 kg (146 lb 13.2 oz)    History of present illness:  Per Dr Tamala Julian Mr. Hohimer is a 65 year old male with a past medical history significant for COPD, AAA, HTN, HLD ischemic cardiomyopathy, CAD; who presented with complaints of acutely worsening shortness of breath. Symptoms started approximately 4 days prior to admission with a productive cough with whitish mucus production and sinus drainage. He reported shortness of breath, chest tightness, and wheezing. Tried utilizing Mucinex and subsequently went to his pulmonologist office 2 days prior to admission for which he was diagnosed with COPD exacerbation with bronchitis. He was given a Z-Pak and prednisone taper with recommendations to use Mucinex and Hydromet cough syrup. He reported using medications as advised, but shortness of breath and cough symptoms did  not improve. His cough did change become more nonproductive. Denied any fever, chills, chest pain, diarrhea, headache, or myalgias.  Hospital Course:  #1 acute on chronic hypoxic respiratory failure secondary to COPD exacerbation in the setting of influenza B. Patient Was admitted with acute on chronic hypoxic respiratory failure felt to be multifactorial secondary to an acute COPD exacerbation in the setting of influenza B which likely triggered patient COPD exacerbation. Patient was placed on IV steroids, scheduled nebulizers, Pulmicort, Brovana, Mucinex and monitored. Patient improved slowly during the hospitalization. IV steroids was subsequently tapered down to oral steroids. Patient be discharged home on the slow steroid taper, scheduled nebulizers and home regimen of Symbicort and Spiriva will be resumed. Patient will follow-up with pulmonary as outpatient.  #2 acute COPD exacerbation Likely triggered by influenza B. Patient was admitted placed on IV steroids, scheduled nebulizers, Mucinex, oxygen, Pulmicort and Brovana. Patient improved slowly during the hospitalization and IV steroids was subsequently tapered down to oral steroids. Patient will be discharged home on a slow steroid taper, scheduled DuoNeb nebs for the next 4-5 days, dalirep. Patient will follow-up with pulmonary 1 week post discharge.  #3 influenza B Patient had presented with worsening shortness of breath with acute on chronic hypoxic respiratory failure. Influenza PCR was done which was positive for influenza B. Patient received a total of 5 days of Tamiflu. Patient improved clinically and will follow-up with PCP as outpatient.  #4 hypertension Stable. Continued on home regimen of hydralazine,isordil, bisoprolol.  #5 hyperlipidemia Fasting lipid panel from 09/01/2015 with a LDL of 36. Continued on home regimen of statin.  #6 hyperkalemia Patient was  noted to be hyperkalemic during the hospitalization and given a dose of  Kayexalate with improvement. Outpatient follow-up.   #7 AAA CT angiogram chest negative for PE but reveals a 6.8 cm AAA. Dr. Sloan Leiter spoke with Dr. Donnetta Hutching of vascular surgery who recommended optimizing blood pressure and patient to follow-up at [facility]. Continued on statin.   #8 chronic systolic and diastolic CHF [id number] with echocardiogram with EF of 60-65% with grade 1 diastolic dysfunction. Previously suppressed EF in June 2015 was 30-35%. Patient remained euvolemic during the hospitalization and was continued on home regimen of furosemide and bisoprolol, hydralazine, Isordil.  #9 ischemic cardiomyopathy/coronary artery disease Stable. Recent Lexiscan myocardial perfusion stress test done 12/14/2015 with a mildly decreased EF of 45-54%, distal inferior septal/apical akinesis. No ST segment deviation noted during stress test. Medium defect of severe severity present in the mid inferior, apical inferior and apical lateral location findings consistent with prior myocardial infarction with mild degree of peri-infarct ischemia. Intermediate risk study. Continued on aspirin, Lipitor, Isordil, hydralazine, bisoprolol. Outpatient follow-up.  #10 renal artery stenosis/subclavian stenosis/carotid stenosis Chronic in nature. Continued on risk factor modification. Continued on home regimen of aspirin and statin, cardiac medications. Patient is followed by vascular surgery at [facility].    Procedures:  Chest x-ray 12/31/2015  CT angiogram chest 12/31/2015    Consultations:  None  Discharge Exam: Filed Vitals:   01/05/16 2046 01/06/16 0435  BP: 121/74 100/65  Pulse: 76 65  Temp: 97.4 F (36.3 C) 98 F (36.7 C)  Resp: 18 18    General: NAD Cardiovascular: RRR Respiratory: Minimal-mild expiratory wheezing.  Discharge Instructions   Discharge Instructions    AMB Referral to [place] Management    Complete by:  As directed   Please assign to Linda for COPD disease and symptom  management. Currently at [facility]. Written consent obtained. Please call with questions. Marthenia Rolling, MSN-Ed, RN,BSN [facility] Liaison [phone]  Reason for consult:  Please assign to [facility] RNCM  Diagnoses of:  COPD/ Pneumonia  Expected date of contact:  1-3 days (reserved for hospital discharges)     Diet - low sodium heart healthy    Complete by:  As directed      Discharge instructions    Complete by:  As directed   Follow up with Eric Form, PA Pulmonary in 1 week. Stop smoking. Use oxygen continously daily until follow up with pulmonary. Follow up with your vascular surgeon at [facility] for follow up on AAA.     Increase activity slowly    Complete by:  As directed           Current Discharge Medication List    START taking these medications   Details  ipratropium-albuterol (DUONEB) 0.5-2.5 (3) MG/3ML SOLN Take 3 mLs by nebulization every 6 (six) hours. Use 4 times daily x 4 days, then every 6 hours as needed. Qty: 360 mL, Refills: 3    polyethylene glycol (MIRALAX / GLYCOLAX) packet Take 17 g by mouth daily. Qty: 30 each, Refills: 0      CONTINUE these medications which have CHANGED   Details  predniSONE (DELTASONE) 20 MG tablet Take 3 tablets (60 mg total) by mouth daily before breakfast. Take 3 tablets (60mg ) daily x 4 days, then 2 tablets (40mg ) daily x 4 days, then 1 tablet (20mg ) daily x 4 days then stop. Qty: 24 tablet, Refills: 0    Tiotropium Bromide Monohydrate (SPIRIVA RESPIMAT) 2.5 MCG/ACT AERS INHALE TWO SPRAY(S) BY MOUTH  ONCE DAILY Qty: 1 Inhaler, Refills: 5      CONTINUE these medications which have NOT CHANGED   Details  albuterol (PROVENTIL) (5 MG/ML) 0.5% nebulizer solution Take 0.5 mLs (2.5 mg total) by nebulization every 4 (four) hours as needed. Dx: J44.9 Qty: 120 vial, Refills: 3    Albuterol Sulfate (PROAIR RESPICLICK) 123XX123 (90 BASE) MCG/ACT AEPB Inhale 2 puffs into the lungs every 6 (six) hours as needed. Qty: 1 each, Refills:  4    amitriptyline (ELAVIL) 25 MG tablet TAKE 1 TABLET AT BEDTIME Qty: 90 tablet, Refills: 1    aspirin 325 MG tablet Take 325 mg by mouth daily.     atorvastatin (LIPITOR) 80 MG tablet TAKE 1 TABLET EVERY DAY (NEED MD APPOINTMENT) Qty: 90 tablet, Refills: 3    b complex vitamins tablet Take 1 tablet by mouth daily.     bisoprolol (ZEBETA) 5 MG tablet TAKE 1 TABLET EVERY DAY Qty: 90 tablet, Refills: 1    budesonide-formoterol (SYMBICORT) 160-4.5 MCG/ACT inhaler Take 2 puffs first thing in am and then another 2 puffs about 12 hours later. Qty: 2 Inhaler, Refills: 0    chlorpheniramine-HYDROcodone (TUSSIONEX PENNKINETIC ER) 10-8 MG/5ML SUER Take 1 tsp by mouth twice daily as need for cough Qty: 115 mL, Refills: 0    Ferrous Sulfate (IRON) 325 (65 FE) MG TABS Take 1 tablet by mouth daily.    fluticasone (FLONASE) 50 MCG/ACT nasal spray USE TWO SPRAY(S) IN EACH NOSTRIL ONCE DAILY Qty: 16 g, Refills: 11    furosemide (LASIX) 20 MG tablet TAKE 1 TABLET EVERY DAY Qty: 90 tablet, Refills: 1    hydrALAZINE (APRESOLINE) 10 MG tablet Take 1 tablet (10 mg total) by mouth 3 (three) times daily. Qty: 90 tablet, Refills: 5    ipratropium (ATROVENT) 0.02 % nebulizer solution Take 2.5 mLs (0.5 mg total) by nebulization every 4 (four) hours as needed. DX J44.9 Qty: 300 mL, Refills: 3   Associated Diagnoses: COPD exacerbation (HCC)    isosorbide dinitrate (ISORDIL) 30 MG tablet Take 0.5 tablets (15 mg total) by mouth daily. Qty: 45 tablet, Refills: 5    KRILL OIL 1000 MG CAPS Take 1 capsule by mouth daily.    morphine (MSIR) 15 MG tablet Take 1 tablet (15 mg total) by mouth 3 (three) times daily. Qty: 90 tablet, Refills: 0    multivitamin (THERAGRAN) per tablet Take 1 tablet by mouth daily.     roflumilast (DALIRESP) 500 MCG TABS tablet Take 1 tablet (500 mcg total) by mouth daily. Qty: 7 tablet, Refills: 0    ALPRAZolam (XANAX) 0.25 MG tablet TAKE ONE TABLET BY MOUTH THREE TIMES DAILY  AS NEEDED FOR ANXIETY Qty: 90 tablet, Refills: 0    HYDROcodone-homatropine (HYDROMET) 5-1.5 MG/5ML syrup Take 5 mLs by mouth every 6 (six) hours as needed. Qty: 240 mL, Refills: 0    Spacer/Aero-Holding Chambers (AEROCHAMBER Z-STAT PLUS CHAMBR) MISC Use as directed Qty: 1 each, Refills: 0      STOP taking these medications     azithromycin (ZITHROMAX Z-PAK) 250 MG tablet        Allergies  Allergen Reactions  . Vancomycin     thrombocytopenia  . Beta Adrenergic Blockers Other (See Comments)    Can take bisoprolol, cannot take less selective BB due to severe copd   . Cephalexin     REACTION: anaphylactic shock  . Moxifloxacin     REACTION: achilles tendon rupture  . Penicillins     REACTION: rash  Follow-up Information    Follow up with Magdalen Spatz, NP On 01/13/2016.   Specialty:  Pulmonary Disease   Why:  F/U AT 1030AM FOR HFU   Contact information:   [address] [place] [address] [phone]       Follow up with Drema Pry, DO In 2 weeks.   Specialty:  Internal Medicine   Contact information:   [place] [address] [phone]       Follow up In 2 weeks.   Why:  follow up with your vascular surgeon at [facility] in 1-2 weeks for AAA       The results of significant diagnostics from this hospitalization (including imaging, microbiology, ancillary and laboratory) are listed below for reference.    Significant Diagnostic Studies: Ct Angio Chest Pe W/cm &/or Wo Cm  12/31/2015  CLINICAL DATA:  65 year old male with shortness of breath and elevated D-dimer. EXAM: CT ANGIOGRAPHY CHEST WITH CONTRAST TECHNIQUE: Multidetector CT imaging of the chest was performed using the standard protocol during bolus administration of intravenous contrast. Multiplanar CT image reconstructions and MIPs were obtained to evaluate the vascular anatomy. CONTRAST:  100 cc Isovue 370 COMPARISON:  Chest radiograph dated 12/30/2015 FINDINGS: Emphysema. A  5 cm CT lingular subpleural blebs. No focal consolidation, pleural effusion, or pneumothorax. In the central airways are patent. There is severe atherosclerotic calcification of the thoracic aorta. The thoracic aorta is tortuous. There is a 4.3 cm aneurysmal dilatation of the ascending thoracic aorta. There is a partially thrombosed 6.8 cm infrarenal abdominal aortic aneurysm. This aneurysm is only partially visualized. CT of the abdomen and pelvis with contrast is recommended for complete evaluation of the abdominal aorta. There is partial occlusion with high-grade stenosis of the origin of the left renal artery. The left renal artery however remain patent. There is advanced atherosclerotic calcification of the celiac axis, and SMA. This vessels however remain patent. There is no dissection of the thoracic aorta. Evaluation of the pulmonary arteries is limited due to suboptimal opacification of the pulmonary artery and timing of the contrast. No definite large central pulmonary artery embolus identified. Top-normal right hilar lymph node. There is no mediastinal adenopathy. No cardiomegaly or pericardial effusion. There is coronary vascular calcification. Esophagus is grossly unremarkable. There is no axillary adenopathy. The chest wall soft tissues appear unremarkable. There is extensive degenerative changes of the spine. No acute fracture. The visualized upper abdominal organs appear unremarkable. There is anterior displacement of the pancreas by the aortic aneurysm. Review of the MIP images confirms the above findings. IMPRESSION: Extensive atherosclerotic calcification of the aorta with a 4.3 cm ascending aortic aneurysm. Ascending thoracic aortic aneurysm. Recommend semi-annual imaging followup by CTA or MRA and referral to cardiothoracic surgery if not already obtained. This recommendation follows 2010 ACCF/AHA/AATS/ACR/ASA/SCA/SCAI/SIR/STS/SVM Guidelines for the Diagnosis and Management of Patients With  Thoracic Aortic Disease. Circulation. 2010; 121: [id number] Partially visualized 6.8 cm infrarenal abdominal aortic aneurysm. Further evaluation with CT of the abdomen pelvis with contrast (or CTA) recommended for complete evaluation of the aortic aneurysm. Suboptimal visualization of the pulmonary vasculature. No definite large central pulmonary artery embolus identified. Emphysema.  No pneumothorax. Electronically Signed   By: Anner Crete M.D.   On: 12/31/2015 02:28   Dg Chest Port 1 View  12/31/2015  CLINICAL DATA:  Shortness of breath.  COPD. EXAM: PORTABLE CHEST 1 VIEW COMPARISON:  07/14/2015 chest radiograph. FINDINGS: Stable cardiomediastinal silhouette with normal heart size. No pneumothorax. No pleural  effusion. Hyperinflated lungs and emphysema. No acute consolidative airspace disease. No pulmonary edema. IMPRESSION: Hyperinflated lungs and emphysema, in keeping with the provided history of COPD. Otherwise no active cardiopulmonary disease. Electronically Signed   By: Ilona Sorrel M.D.   On: 12/31/2015 00:10    Microbiology: No results found for this or any previous visit (from the past 240 hour(s)).   Labs: Basic Metabolic Panel:  Recent Labs Lab 12/31/15 0432 01/01/16 0459 01/03/16 0433 01/05/16 0853 01/05/16 1122 01/06/16 0424  NA 138 141 140 142  --  141  K 5.2* 4.1 4.3 5.5* 5.3* 4.2  CL 102 100* 102 100*  --  102  CO2 24 30 27  32  --  31  GLUCOSE 172* 149* 138* 143*  --  89  BUN 34* 32* 33* 43*  --  45*  CREATININE 1.05 1.19 1.02 1.19  --  1.11  CALCIUM 9.2 9.2 9.1 9.3  --  8.9  MG  --   --  2.0  --   --   --   PHOS  --   --  3.1  --   --   --    Liver Function Tests:  Recent Labs Lab 12/30/15 2349  AST 34  ALT 29  ALKPHOS 126  BILITOT 0.3  PROT 7.6  ALBUMIN 4.2   No results for input(s): LIPASE, AMYLASE in the last 168 hours. No results for input(s): AMMONIA in the last 168 hours. CBC:  Recent Labs Lab 12/30/15 2349 01/01/16 0459 01/05/16 0853  01/06/16 0424  WBC 5.5 6.8 14.5* 14.0*  NEUTROABS 4.3  --   --   --   HGB 12.9* 12.5* 13.2 13.1  HCT 38.8* 38.1* 39.1 38.6*  MCV 91.1 92.7 90.3 89.6  PLT 154 149* 187 203   Cardiac Enzymes: No results for input(s): CKTOTAL, CKMB, CKMBINDEX, TROPONINI in the last 168 hours. BNP: BNP (last 3 results)  Recent Labs  01/10/15 1713 12/31/15 0432  BNP 223.6* 158.3*    ProBNP (last 3 results) No results for input(s): PROBNP in the last 8760 hours.  CBG: No results for input(s): GLUCAP in the last 168 hours.     SignedIrine Seal MD.  [facility] 01/06/2016, 10:55 AM

## 2016-01-06 NOTE — Progress Notes (Signed)
Pt given discharge, follow up, and medication instructions, verbalized understanding, IV and telemetry removed prior to d/c, family to transport home

## 2016-01-07 ENCOUNTER — Other Ambulatory Visit: Payer: Self-pay | Admitting: *Deleted

## 2016-01-07 ENCOUNTER — Telehealth: Payer: Self-pay | Admitting: Internal Medicine

## 2016-01-07 DIAGNOSIS — J441 Chronic obstructive pulmonary disease with (acute) exacerbation: Secondary | ICD-10-CM

## 2016-01-07 NOTE — Telephone Encounter (Signed)
Pt will new rx morphine after 01-20-16

## 2016-01-07 NOTE — Patient Outreach (Signed)
[place] [facility]) Care Management  01/07/2016  [name] [birth year] [id number]  Transition of care (week 1)  RN spoke with pt today concerning his recent discharge and introduced the [facility] program and services. Pt reepetive to the conversation and RN able to complete the transition of care template. Pt reports his upcoming appointment with his primary provided on 4/13 and states he is doing well but remains weak. Pt breathing well wit no distress and continues to take his medications however reports not having filled his rescue inhaler (Albuterol $47). States between his medication and his wife he is having financial difficult. Pt states he has been without this medication for awhile but uses his nebulizer. RN offered to assist with [place] as pt receptive to this referral. Pt aware to seek medical attention with any distressful breathing however will request pharmacy to contact pt earlier next week for possible assistance. RN offered community home visit however pt declined. Pt also not interested in a health coach but receptive to ongoing transition of care call by this RN. Will follow and schedule next telephonic call with this pt for next week.  [name], RN Care Management Coordinator [place] Office [phone]

## 2016-01-09 ENCOUNTER — Other Ambulatory Visit: Payer: Self-pay | Admitting: Critical Care Medicine

## 2016-01-10 ENCOUNTER — Other Ambulatory Visit: Payer: Self-pay

## 2016-01-10 NOTE — Telephone Encounter (Signed)
He will need hospital follow up visit.  According to DC summary they recommend BMET before visit.  He will need to establish with new provider.  If he runs out of pain meds before visit, ok for 1-2 week supply to get him to his visit.v

## 2016-01-10 NOTE — Patient Outreach (Signed)
I called [name] to determine his needs around his medications.  He stated he had his medications except for his ProAir inhaler.  He stated he has difficulty affording his medication and his wife's medications.  I asked if he or his wife have applied for Extra Help.  He stated they have not.  He stated they did not have time to apply for it today.  I made an appointment to call him on 01/20/16 to complete the application for both him and his wife.  I stated that he did qualify for the Pharmacy Emergency Fund to purchase his ProAir.  I will purchase his ProAir and deliver it his house later today or tomorrow.  He stated this was fine.    Deanne Coffer, PharmD, [place] [phone]

## 2016-01-11 MED ORDER — MORPHINE SULFATE 15 MG PO TABS: 15.0000 mg | ORAL_TABLET | Freq: Three times a day (TID) | ORAL | Status: DC

## 2016-01-11 NOTE — Telephone Encounter (Signed)
Left message on machine for patient to return our call.  Rx ready for pick up.  Per Dr Sherren Mocha patient should be referred to pain clinic for further refills.  Patient should also establish with a new PCP.

## 2016-01-12 ENCOUNTER — Other Ambulatory Visit: Payer: Self-pay | Admitting: Pharmacist

## 2016-01-12 NOTE — Patient Outreach (Signed)
[place] [facility]) Care Management  01/12/2016  PHILLIPPE RAYMO 02/10/1951 [id number]   Trampus Barefield is a 65yo who was referred to [place] for medication assistance.  I am covering for [place].  Deanne Coffer, [place], approved patient's albuterol inhaler to be paid for using [place] and scheduled an appointment to complete Extra Help application with patient on 01/20/16.  Dawn identified that the albuterol inhaler on file for patient at [facility] is for albuterol respiclick.  Per patient preference, patient would rather use albuterol HFA.  [place] faxed patient's provider to request for approval to change albuterol respiclick to albuterol HFA.    I made a phone call to [place] to check on the status of patient's prescription.  I spoke to April who confirmed that they have not received a response regarding changing albuterol respiclick to albuterol HFA.    I called patient to update him on this information.  Noted in the chart that patient has a pulmonology appointment tomorrow (01/13/16).  Advised patient to request albuterol HFA prescription during pulmonology visit.  I advised patient to have the prescription sent to a [place] for use of the [facility] Emergency Fund.  Advised patient to call me if albuterol HFA is prescribed.  Patient voiced understanding.   Elisabeth Most, Pharm.D. Pharmacy Resident [place] [phone]

## 2016-01-13 ENCOUNTER — Other Ambulatory Visit: Payer: Self-pay | Admitting: Internal Medicine

## 2016-01-13 ENCOUNTER — Encounter: Payer: Self-pay | Admitting: Acute Care

## 2016-01-13 ENCOUNTER — Other Ambulatory Visit: Payer: Self-pay | Admitting: Pharmacist

## 2016-01-13 ENCOUNTER — Other Ambulatory Visit (INDEPENDENT_AMBULATORY_CARE_PROVIDER_SITE_OTHER): Payer: Commercial Managed Care - HMO

## 2016-01-13 ENCOUNTER — Ambulatory Visit (INDEPENDENT_AMBULATORY_CARE_PROVIDER_SITE_OTHER): Payer: Commercial Managed Care - HMO | Admitting: Acute Care

## 2016-01-13 ENCOUNTER — Telehealth: Payer: Self-pay | Admitting: Acute Care

## 2016-01-13 VITALS — BP 122/60 | HR 71 | Ht 67.0 in | Wt 154.2 lb

## 2016-01-13 DIAGNOSIS — R899 Unspecified abnormal finding in specimens from other organs, systems and tissues: Secondary | ICD-10-CM

## 2016-01-13 DIAGNOSIS — J441 Chronic obstructive pulmonary disease with (acute) exacerbation: Secondary | ICD-10-CM | POA: Diagnosis not present

## 2016-01-13 LAB — BASIC METABOLIC PANEL
BUN: 40 mg/dL — ABNORMAL HIGH (ref 6–23)
CALCIUM: 9.1 mg/dL (ref 8.4–10.5)
CHLORIDE: 99 meq/L (ref 96–112)
CO2: 33 mEq/L — ABNORMAL HIGH (ref 19–32)
CREATININE: 1.25 mg/dL (ref 0.40–1.50)
GFR: 61.22 mL/min (ref >60.00)
Glucose, Bld: 224 mg/dL — ABNORMAL HIGH (ref 70–99)
Potassium: 5 mEq/L (ref 3.5–5.1)
SODIUM: 139 meq/L (ref 135–145)

## 2016-01-13 MED ORDER — ALBUTEROL SULFATE 108 (90 BASE) MCG/ACT IN AEPB: 2.0000 | INHALATION_SPRAY | Freq: Four times a day (QID) | RESPIRATORY_TRACT | Status: DC | PRN

## 2016-01-13 MED ORDER — PREDNISONE 10 MG PO TABS: ORAL_TABLET | ORAL | Status: DC

## 2016-01-13 MED ORDER — ALBUTEROL SULFATE HFA 108 (90 BASE) MCG/ACT IN AERS: 2.0000 | INHALATION_SPRAY | Freq: Four times a day (QID) | RESPIRATORY_TRACT | Status: DC | PRN

## 2016-01-13 MED FILL — VENTOLIN HFA 90 MCG INHALER: 108 (90 BAS | 25 days supply | Qty: 18 | Fill #0
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
## 2016-01-13 NOTE — Progress Notes (Signed)
Note reviewed.  Sonia Baller Ashok Cordia, M.D. [facility]monary [facility] Pager:  [phone] After 3pm or if no response, call [phone] 5:44 PM 01/13/2016

## 2016-01-13 NOTE — Telephone Encounter (Signed)
Lab ordered, appointment made.

## 2016-01-13 NOTE — Assessment & Plan Note (Addendum)
Resolving COPD Exacerbation/ Acute on chronic respiratory failure in setting of Influenza B. Required hospitalization 12/30/2015-01/06/2016. Plan: Continue with your prednisone taper, but add 4 days of 10 mg prior to stopping. We will send in a prescription for regular Pro Air inhaler Taper your oxygen use to nocturnal use as you feel you can. Follow up with Dr. Ashok Cordia in 3 months. Follow up with Dr. Elease Hashimoto 01/21/2016. Please contact office for sooner follow up if symptoms do not improve or worsen or seek emergency care

## 2016-01-13 NOTE — Telephone Encounter (Signed)
Called and spoke with Apolonio Schneiders at [facility]. She states that the pt is requesting ventolin to be called into the pharmacy instead of the proair respclick and would like it sent into [facility]. I explained to her that I would need approval from [place] before I could send the rx. She voiced understanding and had no further questions  Per verbal order from [facility] to send ventolin.   Called and Apolonio Schneiders and informed her that the rx has been sent into the pharmacy. She voiced understanding and had no further questions. Rx sent. Nothing further needed.

## 2016-01-13 NOTE — Patient Instructions (Addendum)
It is nice to meet you today.  I am glad you are feeling better. Continue with your prednisone taper, but add 4 days of 10 mg prior to stopping. We will send in a prescription for regular Pro Air inhaler Taper your oxygen use to nocturnal use as you feel you can. Follow up with Dr. Ashok Cordia in 3 months. Please contact office for sooner follow up if symptoms do not improve or worsen or seek emergency care

## 2016-01-13 NOTE — Patient Outreach (Signed)
[facility]) Care Management  01/13/2016  STORM BRANDSMA 1951/03/05 [id number]   Jacon Schaab is a 65yo who was referred to [facility] for medication assistance.  I received a phone call from Mr. Kalm informing me that a new albuterol prescription was sent during his pulmonology visit today.  Chart reviewed and identified that albuterol inhaler ordered was the albuterol respiclick and patient prefers the albuterol HFA.  I called [facility] with patient's permission.  I spoke to [facility] and confirmed that the prescription was for albuterol respiclick.  I then called the pulmonology office with patient's permission and requested to have prescription changed to albuterol HFA per patient preference.  I received phone call from [facility] at the pulmonology office who confirmed that albuterol HFA prescription was sent to [facility].  Deanne Coffer, [place] management approved patient's albuterol to be paid for using [place].  I arranged to have prescription paid for using [place].  I made a call to patient and updated him that prescription is ready for pick up.   Plan:  Patient will pick up albuterol HFA from [facility].  Follow up scheduled for April 20th with Dawn Pettus to complete Extra Help application.     Elisabeth Most, Pharm.D. Pharmacy Resident [place] [phone]

## 2016-01-14 ENCOUNTER — Other Ambulatory Visit: Payer: Self-pay | Admitting: *Deleted

## 2016-01-14 NOTE — Patient Outreach (Signed)
[facility] [place] [facility]) Care Management  01/14/2016  [name] 13-Jun-1951 [id number]   Transition of care (week 2)  RN spoke with pt today and confirmed assistance has been rendered by [place] concerning his needed medications. Pt doing well and confirms he is in the GREEN zone on his COPD action plan today with no encountered problems. RN verified pt using his medications as prescribed and attending all medical appointments as discussed via plan of care. No inquires or request at this time as RN continue to extend community case management with a home visit as pt continues to declined however appreciative but remains receptive to ongoing transition of care call over the next few weeks. RN will schedule next home visit for next Friday as agreed today by pt for again ongoing transition of care. Note plan of care reviewed for ongoing adherence.  [name], RN Care Management Coordinator [place] Office [phone]

## 2016-01-15 DIAGNOSIS — J449 Chronic obstructive pulmonary disease, unspecified: Secondary | ICD-10-CM | POA: Diagnosis not present

## 2016-01-17 ENCOUNTER — Other Ambulatory Visit: Payer: Self-pay | Admitting: Critical Care Medicine

## 2016-01-18 ENCOUNTER — Other Ambulatory Visit: Payer: Self-pay | Admitting: Internal Medicine

## 2016-01-18 ENCOUNTER — Telehealth: Payer: Self-pay | Admitting: Internal Medicine

## 2016-01-18 MED ORDER — GLUCOSE BLOOD VI STRP: ORAL_STRIP | Status: DC

## 2016-01-18 MED ORDER — ONETOUCH ULTRASOFT LANCETS MISC: Status: DC

## 2016-01-18 MED ORDER — GLUCOSE BLOOD VI STRP
ORAL_STRIP | Status: DC
Start: 2016-01-18 — End: 2016-01-18

## 2016-01-18 MED ORDER — ACCU-CHEK SOFT TOUCH LANCETS MISC: Status: AC

## 2016-01-18 NOTE — Telephone Encounter (Signed)
rx sent

## 2016-01-18 NOTE — Telephone Encounter (Signed)
[facility] called about a prescription that was sent in for test strips and lancets for a One Touch. The insurance won't pay for the One Touch but they will pay for the Accucheck. The pharmacy was wondering if you can send the Rx for the Accucheck strips and the lancets.   [address] [place], [place] [phone] (Phone) [phone] (Fax)

## 2016-01-19 NOTE — Telephone Encounter (Signed)
Ok for new rx for accuchek test strips and lancets.  Please make sure pt has Accuchek glucometer.

## 2016-01-20 ENCOUNTER — Other Ambulatory Visit: Payer: Self-pay

## 2016-01-20 NOTE — Patient Outreach (Signed)
I called [name] and verified his name and date of birth.  I asked if he was still interested in applying for Extra Help.  He stated he was for himself and his wife.  I completed the application for him and his wife.  I asked if he still had enough ProAir and he stated he did.  I will follow up in 4 weeks to see the status of the application.    Deanne Coffer, PharmD, [place] [phone]

## 2016-01-21 ENCOUNTER — Ambulatory Visit (INDEPENDENT_AMBULATORY_CARE_PROVIDER_SITE_OTHER): Payer: Commercial Managed Care - HMO | Admitting: Family Medicine

## 2016-01-21 ENCOUNTER — Other Ambulatory Visit: Payer: Self-pay | Admitting: *Deleted

## 2016-01-21 ENCOUNTER — Encounter: Payer: Self-pay | Admitting: Family Medicine

## 2016-01-21 ENCOUNTER — Encounter: Payer: Self-pay | Admitting: *Deleted

## 2016-01-21 VITALS — BP 110/70 | HR 90 | Temp 98.4°F | Ht 67.0 in | Wt 160.9 lb

## 2016-01-21 DIAGNOSIS — I714 Abdominal aortic aneurysm, without rupture, unspecified: Secondary | ICD-10-CM

## 2016-01-21 DIAGNOSIS — J441 Chronic obstructive pulmonary disease with (acute) exacerbation: Secondary | ICD-10-CM

## 2016-01-21 DIAGNOSIS — E875 Hyperkalemia: Secondary | ICD-10-CM

## 2016-01-21 DIAGNOSIS — R739 Hyperglycemia, unspecified: Secondary | ICD-10-CM | POA: Diagnosis not present

## 2016-01-21 LAB — BASIC METABOLIC PANEL
BUN: 33 mg/dL — ABNORMAL HIGH (ref 6–23)
CALCIUM: 8.8 mg/dL (ref 8.4–10.5)
CO2: 32 mEq/L (ref 19–32)
Chloride: 101 mEq/L (ref 96–112)
Creatinine, Ser: 1.23 mg/dL (ref 0.40–1.50)
GFR: 62.37 mL/min (ref >60.00)
Glucose, Bld: 133 mg/dL — ABNORMAL HIGH (ref 70–99)
POTASSIUM: 4.5 meq/L (ref 3.5–5.1)
SODIUM: 138 meq/L (ref 135–145)

## 2016-01-21 MED ORDER — PREDNISONE 10 MG PO TABS: ORAL_TABLET | ORAL | Status: DC
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
## 2016-01-21 NOTE — Patient Outreach (Signed)
[facility]) Care Management  01/21/2016  [name] 07/08/1951 [id number]   Transition of care ((week 3)   RN spoke with pt today and completed the initial assessment based upon pt having to time to completed today. Also verified pt continues to do well with no encountered issues and has the needed medications including his inhaler (Vanleer assist). Verified pt remains in the GREEN zone with no reported issues. Pt continues to decline [facility] for community home visits but again remains receptive to ongoing transition of care calls. As RN reviewed the the assessment note pt interested in AD packet being sent to him in the mail. Will request administration to send accordingly. No other request or issues at this time as pt aware to monitor his COPD daily and outreach to his provider with exacerbated symptoms if not resolved with his medications or OTC medicine. Will scheduled outreach call from next week.  [name], RN Care Management Coordinator [place] Office [phone]

## 2016-01-21 NOTE — Progress Notes (Signed)
Pre visit review using our clinic review tool, if applicable. No additional management support is needed unless otherwise documented below in the visit note. 

## 2016-01-21 NOTE — Telephone Encounter (Signed)
Patient called and requested refill to be sent to [facility] on [address]. He is completely out. - Thanks-prm

## 2016-01-21 NOTE — Patient Instructions (Signed)
Follow up promptly for any increased shortness of breath or fever Continue with your usual inhalers Monitor blood sugar over next few weeks.  Suspect will come down in next two weeks Let us know if fasting sugars over 130 consistently off prednisone.

## 2016-01-21 NOTE — Patient Outreach (Signed)
[facility]) Care Management  01/21/2016  LEVELL UPLINGER Feb 23, 1951 [id number]  RN spoke briefly with pt today concerning his COPD and pt able to verify he remains in the GREEN zone with no reported issues or problems. RN inquired if pt had time for a few inquires to complete an assessment. Pt requested RN call back later to afternoon to complete such inquires. RN will follow up later today and attempt to completed an initial assessment.   [name], RN Care Management Coordinator [place] Office [phone]

## 2016-01-23 ENCOUNTER — Encounter: Payer: Self-pay | Admitting: Family Medicine

## 2016-01-28 ENCOUNTER — Encounter: Payer: Self-pay | Admitting: Pulmonary Disease

## 2016-01-28 ENCOUNTER — Ambulatory Visit (INDEPENDENT_AMBULATORY_CARE_PROVIDER_SITE_OTHER): Payer: Commercial Managed Care - HMO | Admitting: Pulmonary Disease

## 2016-01-28 ENCOUNTER — Other Ambulatory Visit: Payer: Self-pay | Admitting: *Deleted

## 2016-01-28 VITALS — BP 106/58 | HR 85 | Ht 67.0 in | Wt 161.8 lb

## 2016-01-28 DIAGNOSIS — J441 Chronic obstructive pulmonary disease with (acute) exacerbation: Secondary | ICD-10-CM | POA: Diagnosis not present

## 2016-01-28 MED ORDER — PREDNISONE 10 MG PO TABS: ORAL_TABLET | ORAL | Status: DC

## 2016-01-28 MED ORDER — FLUTTER DEVI: Status: AC

## 2016-01-28 NOTE — Patient Instructions (Signed)
   Continuing to use your cough syrup as needed  Continue to use your nebulizer at home every 4 hours while you are sick  Continue taking your Daliresp, Symbicort, & Spiriva yes  Take Mucinex (guaifenesin) twice daily to help clear your mucus and drink plenty of water  Remember to use the Acapella/flutter valve twice daily to help clear your mucus  We will do a breathing and walking test at her next appointment  Please call or email me if you get any worse and/or we need to see you before your next appointment.  We will keep your follow-up appointment in July unless [facility] to be seen sooner  TESTS ORDERED: 1. Full pulmonary function testing at July follow-up appointment 2. 6 minute walk test at July follow-up appointment

## 2016-01-28 NOTE — Addendum Note (Signed)
Addended by: Parke Poisson E on: 01/28/2016 05:01 PM   Modules accepted: Orders

## 2016-01-28 NOTE — Patient Outreach (Signed)
[facility]) Care Management  01/28/2016  [name] March 10, 1951 [id number]  Transition of care (week 4)  RN spoke with pt today who indicates a visit to his provider this morning due to ongoing wheezing with his COPD. Pt states he is back on Prednisone. Denies any currently breathing issues and no SOB at this time. Pt feels he responded early to his symptoms in seek medical attention early. RN discussed the purpose of responding early to his symptoms with the COPD action plan and this prevent acute episodes from occurring (pt verbalized an understanding). Next follow up with his provider in July unless pt needs to be seen sooner. RN reiterated on the COPD action plan and praise pt for his early response to his symptoms. Pt continues to decline home visit but once again receptive to ongoing transition of care contacts. Plans to follow up in one week with the last transition of care. RN also dissucssed the availiblity of a health coach if he feels he needs ongoing telephonic education concerning his COPD (decline at this time). Will schedule and follow up accordingly.  [name], RN Care Management Coordinator [place] Office [phone]
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
## 2016-01-29 ENCOUNTER — Other Ambulatory Visit: Payer: Self-pay | Admitting: Family Medicine

## 2016-01-31 ENCOUNTER — Telehealth: Payer: Self-pay | Admitting: Pulmonary Disease

## 2016-01-31 ENCOUNTER — Other Ambulatory Visit: Payer: Self-pay | Admitting: Family Medicine

## 2016-01-31 NOTE — Telephone Encounter (Signed)
Spoke with pt, c/o worsening shortness of breath over the weekend.  Pt was seen on Friday by JN.  Denies cough, mucus production, sinus congestion, chest pain. Pt has been using duoneb to help with s/s, as well as maintenance meds.   Pt uses [facility] on [address].    Sending to DOD as Durene Cal is working nights this week. AD please advise on recs.  Thanks!

## 2016-01-31 NOTE — Telephone Encounter (Signed)
Refill once.   He needs to be establishing with new primary provider (previous pt of Dr Shawna Orleans)

## 2016-01-31 NOTE — Telephone Encounter (Signed)
Last refill 01/03/2016 #90 Seen last on 01/17/16 Please advise on refill

## 2016-01-31 NOTE — Telephone Encounter (Signed)
  Just an FYI.   I called up and spoke with the pt personally. He was worse during the weekend but some better now. He did not sound too bad on the phone. On pred taper. Using all his meds.   Plan : 1. Call 911 or go to ER if he gets worse overnight.  2. If he is some worse tomorrow and not too bad, I advised him to call the office and he will need CXR, CBC, BMP. Need to determine if he has PNA and will need abx.  3. I discussed the plan with th pt.    AD

## 2016-02-04 ENCOUNTER — Other Ambulatory Visit: Payer: Self-pay | Admitting: *Deleted

## 2016-02-04 ENCOUNTER — Encounter: Payer: Self-pay | Admitting: *Deleted

## 2016-02-04 NOTE — Patient Outreach (Signed)
[place]) Care Management  02/04/2016  [name] 06/12/1951 [id number]   Transition of care  RN spoke with pt today who indicates he is doing well with no reported issues. RN verifies pt remains in the COPD GREEN zone with no acute events since last contact. Pt verifies he is taking all his prescribed medications with no delays or issues at this time. Pt has verified all his request have been met and grateful for the assistance with pharmacy. RN extended the offer once again for community home visit (declined). RN also offered a health coach for ongoing telephonic calls however pt also declined and feels he is able to manage his care independently. No other request or resources needed at this time as RN will update involved team member that this discipline is done with goals met.  [name], RN Care Management Coordinator [place] Office [phone]

## 2016-02-11 ENCOUNTER — Other Ambulatory Visit: Payer: Self-pay

## 2016-02-11 ENCOUNTER — Ambulatory Visit: Payer: Commercial Managed Care - HMO | Admitting: *Deleted

## 2016-02-11 NOTE — Patient Outreach (Signed)
I called Mr. Laurel to follow up on his Extra Help application.  I had to leave a HIPPA compliant message for him to call me back.  If I do not hear back from him, I will reach out to him at a later date.  This is my first attempt to reach him.   Deanne Coffer, PharmD, [place] [phone]

## 2016-02-14 ENCOUNTER — Telehealth: Payer: Self-pay | Admitting: Internal Medicine

## 2016-02-14 DIAGNOSIS — J449 Chronic obstructive pulmonary disease, unspecified: Secondary | ICD-10-CM | POA: Diagnosis not present

## 2016-02-14 NOTE — Telephone Encounter (Signed)
Pt request refill morphine (MSIR) 15 MG tablet  Pt has establish appointment with Martinique 6/26, but has seen Dr Elease Hashimoto last visit.

## 2016-02-14 NOTE — Telephone Encounter (Signed)
Should we move his New patient appointment to sooner?

## 2016-02-14 NOTE — Telephone Encounter (Signed)
[name] is going to need referral to pain management or a provider that is willing to continue with his current pain med regimen.  Reviewing his records he has had Rx's for Hydrocodone syrup 240 ml filled 02/02/16 and 12/28/15 115 ml. Morphine IR 15 mg last filled 01/23/16 and 12/28/15 # 90. He is also on Alprazolam 0.25 mg filled 01/31/16 , 01/03/16 # 90. Hx of COPD. Thanks. BJ

## 2016-02-15 ENCOUNTER — Other Ambulatory Visit: Payer: Self-pay | Admitting: Critical Care Medicine

## 2016-02-15 NOTE — Telephone Encounter (Signed)
Left vm for pt to return our call  

## 2016-02-16 ENCOUNTER — Other Ambulatory Visit: Payer: Self-pay | Admitting: Pulmonary Disease

## 2016-02-16 MED ORDER — FLUTICASONE PROPIONATE 50 MCG/ACT NA SUSP: NASAL | Status: DC

## 2016-02-16 NOTE — Addendum Note (Signed)
Addended by: Raymondo Band D on: 02/16/2016 02:06 PM   Modules accepted: Orders

## 2016-02-16 NOTE — Telephone Encounter (Signed)
Received failed transmission alert from fluticasone rx sent to pharm this am. Spoke with [name] with [name] who reports they did not receive rx and verified their fax was down this am but is now back up. Advised rx will be resent through escribe. [name] verbalized understanding. Nothing further needed.

## 2016-02-16 NOTE — Telephone Encounter (Signed)
Pt rtn your call

## 2016-02-16 NOTE — Telephone Encounter (Signed)
Notified pt, Pt states that he will call the [place] office to see if there is a physician there that is willing to take on his pain management care.

## 2016-02-21 ENCOUNTER — Ambulatory Visit (INDEPENDENT_AMBULATORY_CARE_PROVIDER_SITE_OTHER): Payer: Commercial Managed Care - HMO | Admitting: Family Medicine

## 2016-02-21 DIAGNOSIS — G8929 Other chronic pain: Secondary | ICD-10-CM | POA: Diagnosis not present

## 2016-02-21 DIAGNOSIS — L989 Disorder of the skin and subcutaneous tissue, unspecified: Secondary | ICD-10-CM | POA: Diagnosis not present

## 2016-02-21 DIAGNOSIS — M545 Low back pain, unspecified: Secondary | ICD-10-CM

## 2016-02-21 MED ORDER — MORPHINE SULFATE 15 MG PO TABS: 15.0000 mg | ORAL_TABLET | Freq: Three times a day (TID) | ORAL | Status: DC
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
## 2016-02-21 NOTE — Patient Instructions (Signed)
We will set up dermatology referral for left face lesion.

## 2016-02-24 ENCOUNTER — Other Ambulatory Visit: Payer: Self-pay | Admitting: Internal Medicine

## 2016-02-24 ENCOUNTER — Other Ambulatory Visit: Payer: Self-pay

## 2016-02-24 DIAGNOSIS — I2510 Atherosclerotic heart disease of native coronary artery without angina pectoris: Secondary | ICD-10-CM | POA: Diagnosis not present

## 2016-02-24 DIAGNOSIS — I716 Thoracoabdominal aortic aneurysm, without rupture: Secondary | ICD-10-CM | POA: Diagnosis not present

## 2016-02-24 DIAGNOSIS — I701 Atherosclerosis of renal artery: Secondary | ICD-10-CM | POA: Diagnosis not present

## 2016-02-24 DIAGNOSIS — I10 Essential (primary) hypertension: Secondary | ICD-10-CM | POA: Diagnosis not present

## 2016-02-24 DIAGNOSIS — I723 Aneurysm of iliac artery: Secondary | ICD-10-CM | POA: Diagnosis not present

## 2016-02-24 DIAGNOSIS — E785 Hyperlipidemia, unspecified: Secondary | ICD-10-CM | POA: Diagnosis not present

## 2016-02-24 DIAGNOSIS — R918 Other nonspecific abnormal finding of lung field: Secondary | ICD-10-CM | POA: Diagnosis not present

## 2016-02-24 NOTE — Patient Outreach (Addendum)
I called Mr. Joles to see if he had heard from social security administration in regards to Extra Help.  He stated that he had been denied.  I asked if he wanted to apply for patient assistance from the pharmaceutical companies.  He stated that he had to spend about $1000 out of pocket before he would qualify.  I was able to check his out of pocket spend and he had not spent that much yet.  He more than likely will be able to reach that when he buys his inhalers next month.  He stated he would like for me to call him in June to see if he has met the $1000 and then we can apply.  I stated I would call him at that time.  I will follow up on 03/23/16.    Deanne Coffer, PharmD, [place] [phone]

## 2016-02-25 ENCOUNTER — Telehealth: Payer: Self-pay | Admitting: Pulmonary Disease

## 2016-02-25 NOTE — Telephone Encounter (Signed)
Spoke with [facility] @ [place]. Pt was seen there yesterday and had CT. There are some very worrisome finding and pt was advised that he needs CTS consult. Pt wanted to wait and see you first for 2nd opinion/confirmation. Patrici Ranks said that she is fine to place a referral if you would like to go ahead and proceed, or if you want to speak to or see pt first and then refer locally. Please call her to discuss case. Results from CT are in [place].   JN Please advise. Thanks!  Jennie's numbers are below.

## 2016-02-29 ENCOUNTER — Other Ambulatory Visit: Payer: Self-pay | Admitting: Family Medicine

## 2016-02-29 NOTE — Telephone Encounter (Signed)
Placed a call to FNP at [facility]. Awaiting a call back. JN

## 2016-03-01 NOTE — Telephone Encounter (Signed)
Last OV 02/21/2016  Last refill #90 -- 51/2017 Please advise if okay to refill 03/02/2016, refills?

## 2016-03-01 NOTE — Telephone Encounter (Signed)
Refill once.  He needs to be re-establishing with new primary.

## 2016-03-07 ENCOUNTER — Telehealth: Payer: Self-pay | Admitting: *Deleted

## 2016-03-07 NOTE — Telephone Encounter (Signed)
-----   Message from Javier Glazier, MD sent at 02/29/2016  5:54 PM EDT ----- Please call the patient and let him know I spoke with the NP at [facility]. They are sending me his CT scan on a disc for my review. Thanks.

## 2016-03-07 NOTE — Telephone Encounter (Signed)
Spoke with pt's wife. She is aware that we are waiting on this CT disc. Nothing further was needed at this time.

## 2016-03-13 ENCOUNTER — Other Ambulatory Visit: Payer: Self-pay | Admitting: Internal Medicine

## 2016-03-14 NOTE — Telephone Encounter (Signed)
Dr. Martinique, pt has an appt with you June 26th, okay to refill Amitriptyline?

## 2016-03-16 DIAGNOSIS — J449 Chronic obstructive pulmonary disease, unspecified: Secondary | ICD-10-CM | POA: Diagnosis not present

## 2016-03-20 ENCOUNTER — Telehealth: Payer: Self-pay | Admitting: Internal Medicine

## 2016-03-20 NOTE — Telephone Encounter (Signed)
Pt has an appt with dr Martinique next week. Pt needs new rx morphine 15 mg. Pt say  burchette refill med last time

## 2016-03-20 NOTE — Telephone Encounter (Signed)
Medication approval needs to be sent to Dr. Martinique since he will be her new patient.

## 2016-03-21 ENCOUNTER — Telehealth: Payer: Self-pay | Admitting: *Deleted

## 2016-03-21 DIAGNOSIS — R911 Solitary pulmonary nodule: Secondary | ICD-10-CM

## 2016-03-21 NOTE — Telephone Encounter (Signed)
-----   Message from Javier Glazier, MD sent at 03/10/2016  6:06 PM EDT ----- Please call the patient and let him know I reviewed his CT scan from [facility]. Comparing that with his CT scan from March I believe that the abnormality seen on it is likely inflammation and unlikely to represent cancer. I would favor repeating a CT scan without contrast for this left lung opacity in July. Please have him call me if he has any further questions or concerns.  Thank you.

## 2016-03-21 NOTE — Telephone Encounter (Signed)
Called spoke with patient and discussed CT results / recs as stated by JN below.  Pt voiced his understanding and denied any questions/concerns.  CT w/o contrast order placed for July - will try to coordinate w/ pt's upcoming 7.14.17 appt with JN.  Nothing further needed; will sign off.

## 2016-03-23 ENCOUNTER — Ambulatory Visit: Payer: Self-pay

## 2016-03-23 ENCOUNTER — Encounter: Payer: Self-pay | Admitting: Family Medicine

## 2016-03-23 ENCOUNTER — Ambulatory Visit (INDEPENDENT_AMBULATORY_CARE_PROVIDER_SITE_OTHER): Payer: Commercial Managed Care - HMO | Admitting: Family Medicine

## 2016-03-23 VITALS — BP 116/80 | HR 87 | Temp 98.1°F | Resp 20 | Ht 67.0 in | Wt 165.0 lb

## 2016-03-23 DIAGNOSIS — G894 Chronic pain syndrome: Secondary | ICD-10-CM

## 2016-03-23 DIAGNOSIS — F419 Anxiety disorder, unspecified: Secondary | ICD-10-CM | POA: Diagnosis not present

## 2016-03-23 DIAGNOSIS — G4700 Insomnia, unspecified: Secondary | ICD-10-CM

## 2016-03-23 DIAGNOSIS — M5417 Radiculopathy, lumbosacral region: Secondary | ICD-10-CM | POA: Diagnosis not present

## 2016-03-23 DIAGNOSIS — R739 Hyperglycemia, unspecified: Secondary | ICD-10-CM | POA: Diagnosis not present

## 2016-03-23 DIAGNOSIS — M5416 Radiculopathy, lumbar region: Secondary | ICD-10-CM

## 2016-03-23 LAB — POCT GLYCOSYLATED HEMOGLOBIN (HGB A1C): HEMOGLOBIN A1C: 5.9

## 2016-03-23 MED ORDER — MORPHINE SULFATE 15 MG PO TABS: 15.0000 mg | ORAL_TABLET | Freq: Three times a day (TID) | ORAL | Status: DC

## 2016-03-23 NOTE — Patient Instructions (Addendum)
A few things to remember from today's visit:   1. Chronic pain disorder  - morphine (MSIR) 15 MG tablet; Take 1 tablet (15 mg total) by mouth 3 (three) times daily.  Dispense: 90 tablet; Refill: 0  2. Lumbar back pain with radiculopathy affecting right lower extremity  - morphine (MSIR) 15 MG tablet; Take 1 tablet (15 mg total) by mouth 3 (three) times daily.  Dispense: 90 tablet; Refill: 0  3. Anxiety disorder, unspecified   4. Hyperglycemia  - POC HgB A1c     Pain is chronic and the goal with medication is to decrease pain to a level when you are able to function, pain will not be zero. Opioid medications have many side effects: constipation, nausea, vomiting,sedation, decreased psychomotor function, urinary retention, addiction among some.  Because opioids are controlled medications refills cannot be given, you may need to pick up a prescription monthly, and undergo urine toxic screenings to verify the presence of medication in urine.  Illegal substance are not tolerated when you are taking an opioid medication for pain management. Bring your medications with you for office visits. At some point if I feel like pain is not adequately controlled, I will recommend referral to pain specialists.    A1C 5.9, otherwise normal.  If you sign-up for My chart, you can communicate easier with [place] in case you have any question or concern.
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
## 2016-03-23 NOTE — Telephone Encounter (Signed)
Pt has appointment today, 6/22 with FDr Martinique.

## 2016-03-27 ENCOUNTER — Ambulatory Visit: Payer: Commercial Managed Care - HMO | Admitting: Family Medicine

## 2016-03-28 ENCOUNTER — Other Ambulatory Visit: Payer: Self-pay | Admitting: Family Medicine

## 2016-03-28 NOTE — Telephone Encounter (Signed)
Last refill:12/31/2015

## 2016-03-28 NOTE — Telephone Encounter (Signed)
I am not his primary .  I was "bridging the gap" until he re-established.  He has seen Dr Martinique who is his new primary.

## 2016-03-29 NOTE — Telephone Encounter (Signed)
Ok. Thanks!

## 2016-04-03 ENCOUNTER — Other Ambulatory Visit: Payer: Self-pay | Admitting: Family Medicine

## 2016-04-03 MED ORDER — ALPRAZOLAM 0.25 MG PO TABS: 0.2500 mg | ORAL_TABLET | Freq: Three times a day (TID) | ORAL | Status: DC | PRN

## 2016-04-03 NOTE — Telephone Encounter (Signed)
Last refill 03/01/16, it seems like he had a refill left from last Rx, can you please verify with pharmacy. If he has no refills available, Rx for Alprazolam 0.25 mg to take 3 times per day as needed # 90/2 refills can be called in. Thanks, BJ

## 2016-04-03 NOTE — Telephone Encounter (Signed)
Called the pharmacy and patient does not have any refills on file. Gave the new rx to the pharmacy to fill over the phone.

## 2016-04-03 NOTE — Telephone Encounter (Signed)
Pt is out °

## 2016-04-03 NOTE — Addendum Note (Signed)
Addended by: Kateri Mc E on: 04/03/2016 01:27 PM   Modules accepted: Orders

## 2016-04-07 ENCOUNTER — Other Ambulatory Visit: Payer: Self-pay | Admitting: *Deleted

## 2016-04-07 DIAGNOSIS — L821 Other seborrheic keratosis: Secondary | ICD-10-CM | POA: Diagnosis not present

## 2016-04-07 DIAGNOSIS — L578 Other skin changes due to chronic exposure to nonionizing radiation: Secondary | ICD-10-CM | POA: Diagnosis not present

## 2016-04-07 DIAGNOSIS — L570 Actinic keratosis: Secondary | ICD-10-CM | POA: Diagnosis not present

## 2016-04-07 DIAGNOSIS — D1801 Hemangioma of skin and subcutaneous tissue: Secondary | ICD-10-CM | POA: Diagnosis not present

## 2016-04-07 DIAGNOSIS — C44319 Basal cell carcinoma of skin of other parts of face: Secondary | ICD-10-CM | POA: Diagnosis not present

## 2016-04-12 ENCOUNTER — Ambulatory Visit (INDEPENDENT_AMBULATORY_CARE_PROVIDER_SITE_OTHER)
Admission: RE | Admit: 2016-04-12 | Discharge: 2016-04-12 | Disposition: A | Discharge status: Home / Self Care | Payer: Commercial Managed Care - HMO | Source: Ambulatory Visit | Attending: Pulmonary Disease | Admitting: Pulmonary Disease

## 2016-04-12 ENCOUNTER — Ambulatory Visit (INDEPENDENT_AMBULATORY_CARE_PROVIDER_SITE_OTHER): Payer: Commercial Managed Care - HMO | Admitting: Pulmonary Disease

## 2016-04-12 DIAGNOSIS — R911 Solitary pulmonary nodule: Secondary | ICD-10-CM | POA: Diagnosis not present

## 2016-04-12 DIAGNOSIS — J441 Chronic obstructive pulmonary disease with (acute) exacerbation: Secondary | ICD-10-CM

## 2016-04-12 LAB — PULMONARY FUNCTION TEST
DL/VA % PRED: 46 %
DL/VA: 2.07 ml/min/mmHg/L
DLCO cor % pred: 35 %
DLCO cor: 10.04 ml/min/mmHg
DLCO unc % pred: 34 %
DLCO unc: 9.9 ml/min/mmHg
FEF 25-75 Post: 0.36 L/sec
FEF 25-75 Pre: 0.37 L/sec
FEF2575-%CHANGE-POST: -2 %
FEF2575-%Pred-Post: 15 %
FEF2575-%Pred-Pre: 15 %
FEV1-%Change-Post: 2 %
FEV1-%PRED-PRE: 32 %
FEV1-%Pred-Post: 33 %
FEV1-PRE: 0.97 L
FEV1-Post: 1 L
FEV1FVC-%Change-Post: 6 %
FEV1FVC-%Pred-Pre: 56 %
FEV6-%Change-Post: 0 %
FEV6-%PRED-PRE: 56 %
FEV6-%Pred-Post: 56 %
FEV6-POST: 2.13 L
FEV6-Pre: 2.15 L
FEV6FVC-%Change-Post: 2 %
FEV6FVC-%PRED-POST: 101 %
FEV6FVC-%Pred-Pre: 99 %
FVC-%Change-Post: -3 %
FVC-%PRED-POST: 55 %
FVC-%PRED-PRE: 57 %
FVC-POST: 2.24 L
FVC-PRE: 2.33 L
POST FEV6/FVC RATIO: 95 %
PRE FEV1/FVC RATIO: 42 %
PRE FEV6/FVC RATIO: 93 %
Post FEV1/FVC ratio: 45 %
RV % PRED: 150 %
RV: 3.36 L
TLC % pred: 99 %
TLC: 6.39 L

## 2016-04-12 NOTE — Progress Notes (Signed)
PFT done today. 

## 2016-04-13 ENCOUNTER — Other Ambulatory Visit: Payer: Self-pay | Admitting: Pulmonary Disease

## 2016-04-14 ENCOUNTER — Encounter: Payer: Self-pay | Admitting: Family Medicine

## 2016-04-14 ENCOUNTER — Encounter: Payer: Self-pay | Admitting: Pulmonary Disease

## 2016-04-14 ENCOUNTER — Ambulatory Visit: Payer: Commercial Managed Care - HMO | Admitting: Family Medicine

## 2016-04-14 ENCOUNTER — Ambulatory Visit (INDEPENDENT_AMBULATORY_CARE_PROVIDER_SITE_OTHER): Payer: Commercial Managed Care - HMO | Admitting: Pulmonary Disease

## 2016-04-14 ENCOUNTER — Ambulatory Visit (INDEPENDENT_AMBULATORY_CARE_PROVIDER_SITE_OTHER): Payer: Commercial Managed Care - HMO | Admitting: Family Medicine

## 2016-04-14 VITALS — BP 110/62 | HR 74 | Ht 67.0 in | Wt 164.2 lb

## 2016-04-14 VITALS — BP 118/80 | HR 88 | Temp 98.3°F | Resp 16 | Ht 67.0 in | Wt 164.0 lb

## 2016-04-14 DIAGNOSIS — G894 Chronic pain syndrome: Secondary | ICD-10-CM

## 2016-04-14 DIAGNOSIS — M5416 Radiculopathy, lumbar region: Secondary | ICD-10-CM

## 2016-04-14 DIAGNOSIS — J449 Chronic obstructive pulmonary disease, unspecified: Secondary | ICD-10-CM

## 2016-04-14 DIAGNOSIS — F419 Anxiety disorder, unspecified: Secondary | ICD-10-CM

## 2016-04-14 DIAGNOSIS — R918 Other nonspecific abnormal finding of lung field: Secondary | ICD-10-CM | POA: Diagnosis not present

## 2016-04-14 DIAGNOSIS — Z79899 Other long term (current) drug therapy: Secondary | ICD-10-CM | POA: Diagnosis not present

## 2016-04-14 DIAGNOSIS — R0609 Other forms of dyspnea: Secondary | ICD-10-CM | POA: Diagnosis not present

## 2016-04-14 DIAGNOSIS — G4700 Insomnia, unspecified: Secondary | ICD-10-CM

## 2016-04-14 DIAGNOSIS — M5417 Radiculopathy, lumbosacral region: Secondary | ICD-10-CM

## 2016-04-14 DIAGNOSIS — Z79891 Long term (current) use of opiate analgesic: Secondary | ICD-10-CM | POA: Diagnosis not present

## 2016-04-14 DIAGNOSIS — J441 Chronic obstructive pulmonary disease with (acute) exacerbation: Secondary | ICD-10-CM

## 2016-04-14 MED ORDER — AMITRIPTYLINE HCL 25 MG PO TABS: 12.5000 mg | ORAL_TABLET | Freq: Every day | ORAL | Status: DC

## 2016-04-14 MED ORDER — MORPHINE SULFATE 15 MG PO TABS: 15.0000 mg | ORAL_TABLET | Freq: Three times a day (TID) | ORAL | Status: DC

## 2016-04-14 MED ORDER — TRAZODONE HCL 50 MG PO TABS: 25.0000 mg | ORAL_TABLET | Freq: Every evening | ORAL | Status: DC | PRN

## 2016-04-14 NOTE — Progress Notes (Signed)
Test reviewed.  

## 2016-04-14 NOTE — Progress Notes (Signed)
Pre visit review using our clinic review tool, if applicable. No additional management support is needed unless otherwise documented below in the visit note. 

## 2016-04-14 NOTE — Patient Instructions (Signed)
   Use your flutter valve twice daily.  Call me if your breathing gets worse.  We will repeat your CT scan before your next appointment.  I will see you back in 4-6 weeks or sooner if needed.  TESTS ORDERED: 1. CT Chest w/o contrast in 4-6 weeks

## 2016-04-14 NOTE — Patient Instructions (Addendum)
A few things to remember from today's visit:   Insomnia, unspecified - Plan: amitriptyline (ELAVIL) 25 MG tablet, traZODone (DESYREL) 50 MG tablet  Chronic pain disorder - Plan: morphine (MSIR) 15 MG tablet, DISCONTINUED: morphine (MSIR) 15 MG tablet  Anxiety disorder, unspecified - Plan: traZODone (DESYREL) 50 MG tablet  Lumbar back pain with radiculopathy affecting right lower extremity - Plan: morphine (MSIR) 15 MG tablet, DISCONTINUED: morphine (MSIR) 15 MG tablet  For insomnia today trazodone was added, decrease amitriptyline to half tablet, consider over-the-counter extended release melatonin. Good sleep hygiene. No changes in pain medication, no changes on alprazolam. Given your history of COPD this medication needed to be take with caution because he can decreased respiratory drive.   Keep appointment with pulmonologists today.   Please be sure medication list is accurate. If a new problem present, please set up appointment sooner than planned today.
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
## 2016-04-15 DIAGNOSIS — J449 Chronic obstructive pulmonary disease, unspecified: Secondary | ICD-10-CM | POA: Diagnosis not present

## 2016-04-25 ENCOUNTER — Other Ambulatory Visit: Payer: Self-pay | Admitting: Internal Medicine

## 2016-05-02 DIAGNOSIS — C44319 Basal cell carcinoma of skin of other parts of face: Secondary | ICD-10-CM | POA: Diagnosis not present

## 2016-05-02 DIAGNOSIS — Z85828 Personal history of other malignant neoplasm of skin: Secondary | ICD-10-CM | POA: Diagnosis not present

## 2016-05-11 ENCOUNTER — Encounter: Payer: Self-pay | Admitting: Internal Medicine

## 2016-05-16 DIAGNOSIS — J449 Chronic obstructive pulmonary disease, unspecified: Secondary | ICD-10-CM | POA: Diagnosis not present

## 2016-05-29 ENCOUNTER — Ambulatory Visit (INDEPENDENT_AMBULATORY_CARE_PROVIDER_SITE_OTHER)
Admission: RE | Admit: 2016-05-29 | Discharge: 2016-05-29 | Disposition: A | Discharge status: Home / Self Care | Payer: Commercial Managed Care - HMO | Source: Ambulatory Visit | Attending: Pulmonary Disease | Admitting: Pulmonary Disease

## 2016-05-29 DIAGNOSIS — R918 Other nonspecific abnormal finding of lung field: Secondary | ICD-10-CM | POA: Diagnosis not present

## 2016-06-07 ENCOUNTER — Encounter: Payer: Self-pay | Admitting: Pulmonary Disease

## 2016-06-07 ENCOUNTER — Ambulatory Visit (INDEPENDENT_AMBULATORY_CARE_PROVIDER_SITE_OTHER): Payer: Commercial Managed Care - HMO | Admitting: Pulmonary Disease

## 2016-06-07 VITALS — BP 114/72 | HR 76 | Ht 67.0 in | Wt 161.8 lb

## 2016-06-07 DIAGNOSIS — R918 Other nonspecific abnormal finding of lung field: Secondary | ICD-10-CM

## 2016-06-07 DIAGNOSIS — J449 Chronic obstructive pulmonary disease, unspecified: Secondary | ICD-10-CM | POA: Diagnosis not present

## 2016-06-07 MED ORDER — BUDESONIDE-FORMOTEROL FUMARATE 160-4.5 MCG/ACT IN AERO: 2.0000 | INHALATION_SPRAY | Freq: Two times a day (BID) | RESPIRATORY_TRACT | 0 refills | Status: DC

## 2016-06-07 MED ORDER — ROFLUMILAST 500 MCG PO TABS: 500.0000 ug | ORAL_TABLET | Freq: Every day | ORAL | 0 refills | Status: DC

## 2016-06-07 MED ORDER — PREDNISONE 20 MG PO TABS: 40.0000 mg | ORAL_TABLET | Freq: Every day | ORAL | 0 refills | Status: DC

## 2016-06-07 NOTE — Progress Notes (Signed)
Subjective:    Patient ID: [name], male    DOB: Feb 02, 1951, 65 y.o.   MRN: [id number]  C.C.:  Follow-up for Very, Severe COPD & Left Lung Opacity & Nodules.  HPI Very, Severe COPD:  Prescribed Symbicort, Atrovent nebs qid, & Daliresp. Previously given samples of Daliresp. Reports he has been out of [place]. He isn't sure if the Daliresp is helping. He reports he is coughing more frequently lately & feeling more tight in the chest. He is using his nebulizer more frequently. Cough is producing a "light gray" mucus. He feels his symptoms do seem to be worsening over at least 7 days. Reports he did have improved mucus clearance on Mucinex but had no benefit from his flutter valve.   Left Lung Opacity & Nodules:  No change in CT imagincompared with prior CT imaging in May. No change on repeat CT imaging in August.  Review of Systems No fever or chills. Did wake up yesterday with a sweats. No nausea or emesis. Intermittent headaches. No near syncope.   Allergies  Allergen Reactions  . Vancomycin     thrombocytopenia  . Beta Adrenergic Blockers Other (See Comments)    Can take bisoprolol, cannot take less selective BB due to severe copd   . Cephalexin     REACTION: anaphylactic shock  . Moxifloxacin     REACTION: achilles tendon rupture  . Penicillins     REACTION: rash    Current Outpatient Prescriptions on File Prior to Visit  Medication Sig Dispense Refill  . albuterol (PROVENTIL HFA;VENTOLIN HFA) 108 (90 Base) MCG/ACT inhaler Inhale 2 puffs into the lungs every 6 (six) hours as needed for wheezing or shortness of breath. 1 Inhaler 6  . albuterol (PROVENTIL) (5 MG/ML) 0.5% nebulizer solution TAKE 0.5 ML(S) BY NEBULIZATION EVERY FOUR HOURS AS NEEDED 150 vial 3  . ALPRAZolam (XANAX) 0.25 MG tablet Take 1 tablet (0.25 mg total) by mouth 3 (three) times daily as needed. for anxiety 90 tablet 2  . aspirin 325 MG tablet Take 325 mg by mouth daily.     Marland Kitchen atorvastatin (LIPITOR) 80 MG  tablet TAKE 1 TABLET EVERY DAY (NEED MD APPOINTMENT) 90 tablet 3  . b complex vitamins tablet Take 1 tablet by mouth daily.     . bisoprolol (ZEBETA) 5 MG tablet TAKE 1 TABLET EVERY DAY 90 tablet 1  . budesonide-formoterol (SYMBICORT) 160-4.5 MCG/ACT inhaler Take 2 puffs first thing in am and then another 2 puffs about 12 hours later. 2 Inhaler 0  . DALIRESP 500 MCG TABS tablet TAKE ONE TABLET BY MOUTH ONCE DAILY 30 tablet 5  . Ferrous Sulfate (IRON) 325 (65 FE) MG TABS Take 1 tablet by mouth daily.    . fluticasone (FLONASE) 50 MCG/ACT nasal spray USE TWO SPRAY(S) IN EACH NOSTRIL ONCE DAILY 16 g 1  . furosemide (LASIX) 20 MG tablet TAKE 1 TABLET EVERY DAY 90 tablet 1  . glucose blood test strip Test once daily E11.9 100 each 12  . hydrALAZINE (APRESOLINE) 10 MG tablet TAKE 1 TABLET THREE TIMES DAILY 270 tablet 0  . ipratropium (ATROVENT) 0.02 % nebulizer solution INHALE  2.5 ML THREE TIMES DAILY (Patient taking differently: INHALE  2.5 ML FOUR TIMES DAILY) 25 mL 5  . ipratropium-albuterol (DUONEB) 0.5-2.5 (3) MG/3ML SOLN Take 3 mLs by nebulization every 6 (six) hours. Use 4 times daily x 4 days, then every 6 hours as needed. 360 mL 3  . isosorbide dinitrate (ISORDIL) 30 MG  tablet Take 0.5 tablets (15 mg total) by mouth daily. 45 tablet 5  . KRILL OIL 1000 MG CAPS Take 1 capsule by mouth daily.    . Lancets (ACCU-CHEK SOFT TOUCH) lancets Test once daily Dx E11.9 100 each 12  . morphine (MSIR) 15 MG tablet Take 1 tablet (15 mg total) by mouth 3 (three) times daily. 90 tablet 0  . multivitamin (THERAGRAN) per tablet Take 1 tablet by mouth daily.     . polyethylene glycol (MIRALAX / GLYCOLAX) packet Take 17 g by mouth daily. (Patient taking differently: Take 17 g by mouth daily as needed. ) 30 each 0  . Respiratory Therapy Supplies (FLUTTER) DEVI Use as directed. 1 each 0  . traZODone (DESYREL) 50 MG tablet Take 0.5-1 tablets (25-50 mg total) by mouth at bedtime as needed for sleep. 30 tablet 1  .  amitriptyline (ELAVIL) 25 MG tablet Take 0.5 tablets (12.5 mg total) by mouth at bedtime. (Patient not taking: Reported on 06/07/2016) 90 tablet 1   No current facility-administered medications on file prior to visit.     Past Medical History:  Diagnosis Date  . AAA (abdominal aortic aneurysm) (HCC)    5.0 X 4.8;  followed by vascular surgery  . Anemia   . Carotid stenosis    carotids 09/2011 bilateral 60-79% ICA stenosis  . COPD (chronic obstructive pulmonary disease) (HCC)    Golds Stage II- Fev1 73% , FVC 49%, Rv 135%, DLCO 68%-03/2007  . Coronary artery disease 11/12   LHC 08/04/11:normal LM. The proximal LAD just beyond the ostium of this diagonal had a long 90% stenosis with TIMI 2 flow. The LAD was also collateralized by the RCA. No disease in the Lcx. 50% mid RCA stenosis. The RCA gives collaterals to the LAD. LV EF appeared normal, estimate 55%, no definite wall motion abnormalities.  PCI was attempted of his LAD but could not be crossed - med Tx  . Hepatitis, alcoholic    January 123456  . History of hypokalemia    secondary to alcohol abuse January 2006  . Hyperlipidemia   . Hypertension   . Hypomagnesemia    secondary to alcohol abuse January 2006  . Ischemic cardiomyopathy    echo 09/2011: EF 35-40%, akinesis of the distal LV, moderate LVH, grade 1 diastolic dysfunction, moderate LAE, PASP 34.  Marland Kitchen Left eye trauma    status post 15 years agoto the left eye  . Lumbar back pain   . Migraine headache   . Myocardial infarction ([place]) 11/12  . PAD (peripheral artery disease) (HCC)    with intermittent claudication, bilateral SFA occlusion  . Pneumonia Oc. 2012  . Renal artery stenosis (HCC)    Abdominal ultrasound 03/2010:4.7 x 5.0 cm AAA, critical right renal artery stenosis and greater than 60% left renal artery stenosis  . Subclavian artery stenosis, left    left subclavian stenosis by ultrasound   . Visual field cut    right with supranasal quadrantopia to retinal artery  occlusion    Past Surgical History:  Procedure Laterality Date  . LEFT HEART CATHETERIZATION WITH CORONARY ANGIOGRAM N/A 08/11/2011   Procedure: LEFT HEART CATHETERIZATION WITH CORONARY ANGIOGRAM;  Surgeon: Larey Dresser, MD;  Location: [facility] CATH LAB;  Service: Cardiovascular;  Laterality: N/A;  . PERCUTANEOUS CORONARY STENT INTERVENTION (PCI-S) N/A 08/11/2011   Procedure: PERCUTANEOUS CORONARY STENT INTERVENTION (PCI-S);  Surgeon: Peter M Martinique, MD;  Location: [facility] CATH LAB;  Service: Cardiovascular;  Laterality: N/A;  . TONSILLECTOMY AND  ADENOIDECTOMY  1958    Family History  Problem Relation Age of Onset  . Throat cancer Mother     died young age secondary to throat cancer  . Cancer Mother     Throat  . Hypertension Sister   . Diabetes Daughter   . Heart disease Father   . Hyperlipidemia Father   . Hypertension Father   . Heart attack Father     Social History   Social History  . Marital status: Married    Spouse name: N/A  . Number of children: N/A  . Years of education: N/A   Occupational History  . 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 Smoking status: Former Smoker    Packs/day: 1.00    Years: 55.00    Types: Cigarettes    Quit date: 10/15/2015  . Smokeless tobacco: Never Used  . Alcohol use No     Comment: fomer abuse quit 2006  . Drug use: No  . Sexual activity: Not Asked   Other Topics Concern  . None   Social History Narrative   Occupation: retired, unemployed since 06/2010   Married with two grown daughters    current smoker - using e cigarettes   Alcohol use-no; former abuse quit 2006    Smoking Status:  current               Objective:   Physical Exam BP 114/72 (BP Location: Right Arm, Patient Position: Sitting, Cuff Size: Normal)   Pulse 76   Ht 5\' 7"  (1.702 m)   Wt 161 lb 12.8 oz (73.4 kg)   SpO2 95%   BMI 25.34 kg/m  General: Caucasian male. No distress. Awake. Integument:  Warm & dry.  No rash on exposed skin.  HEENT:  No nasal turbinate swelling. No oral ulcers. No significant nasal turbinate swelling. Cardiovascular:  Regular rate.  Normal S1 & S2. No edema. Pulmonary: Very mild coarse wheeze. Otherwise good aeration bilaterally. Normal work of breathing on room air. Abdomen: Soft. Mildly protuberant. Nontender.  PFT 04/12/16: FVC 2.33 L (57%) FEV1 0.97 L (32%) FEV1/FVC 0.42 FEF 25-75 0.37 L (15%) no bronchodilator response TLC 6.39 L (99%) RV 150% ERV 142% DLCO corrected 35% (hemoglobin 14.1) 08/17/11: FVC 2.59 L (61%) FEV1 1.34 L (40%) FEV1/FVC 0.52 FEF 25-75 0.50 L (16%)  6MWT 04/14/16:  Walked 240 meters / Baseline Sat 95% on RA / Nadir Sat 88% on RA (unable to complete test due to fatigue, dyspnea, & ankle discomfort)  IMAGING CT CHEST W/O 05/29/16 (personally reviewed by me): Centrilobular and paraseptal emphysema that is atypical predominant. 5 mm left upper lobe nodule & 5 mm nodule in superior segment of left lower lobe. Bandlike opacity left upper lobe stable. No new pulmonary nodule or opacity appreciated. No pleural effusion or thickening. No pathologic mediastinal adenopathy. No pericardial effusion.  CT CHEST W/O 04/12/16 (previously reviewed by me): Left upper lobe 1.0 x 2.4 x 1.4 family capacity along with a 6 mm spiculated nodule and left lower lobe. No pathologic mediastinal adenopathy. No pleural effusion or thickening. No pericardial effusion.  CTA CHEST/ABD/PELVIS 02/24/16 (previously reviewed by me): Abdominal aortic aneurysm measuring up to 6.9 cm. Left upper lobe opacification. Bilateral renal artery stenosis.  CXR PA/LAT 07/14/15 (previously reviewed by me): No nodule or opacification appreciated. Hyperinflation with flattening of the diaphragms. Heart normal in size. Mediastinum normal in contour.  CARDIAC EKG (01/16/15):  NSR W/ left anterior  fasicular block. QTc 430ms.  TTE (03/30/14): LV normal in size with moderate LVH. Focal basal hypertrophy.  EF 30-35%. Akinesis of the entire apical myocardium. LA & RA normal in size. RV normal in size and function. Pulmonary artery systolic pressure 24 mmHg. No aortic stenosis with trivial regurgitation. No mitral stenosis or regurgitation. No pulmonic regurgitation. Trivial tricuspid regurgitation. No pericardial effusion.  MICROBIOLOGY SPUTUM (07/15/15):  Light Pseudomonas aeruginosa (Resistant to Rocephin) / AFB pending / Fungal pending Respiratory Viral Panel PCR (07/14/15): Negative  LABS 11/01/15 Alpha-1 antitrypsin: MM (218)  07/14/15 CBC: 9.7/12.5/37.8/175 Eos:  0.1 IgE: 17  01/10/15 BMP: 138/5.0/97/30/33/1.26/142/9.7 LFT: 4.0/6.7/0.8/77/29/29 BNP: 223.6 CBC: 13.3/14.0/42.5/161  04/17/13 ABG on 4 L/m:  7.28 / 34 / 68 / 93%    Assessment & Plan:  65 y.o. Caucasian male with Very, Severe COPD and left upper lobe lung opacity and nodules.  I personally reviewed his chest CT scan with him today which shows no change in his left upper lobe bandlike opacity or left lung nodules. Given his history malignancy remains a concern. I will plan on repeating a CT scan of his chest without contrast in 3 months to ensure there is no progression of his left lower lobe nodule or left upper lobe bandlike opacity. It's unclear to me whether or not he is getting any symptomatic benefit from [place]. I instructed the patient to contact my office if he felt his breathing was worsening oriented any questions before his next appointment.  1. Very, Severe COPD: Continuing Symbicort, Daliresp, and Atrovent nebulized 4 times daily. Patient given samples of Symbicort and Daliresp today. Patient sent in prescriptions for emergency prednisone. 2. Left Upper Lobe Opacity & Lung Nodules: Plan for repeat CT chest without contrast in 3 months (November 2017). 3. Health Maintenance: Patient received Prevnar November 2014, & Pneumovax July 2013. Recommended high-dose influenza vaccine later this month. 4. Follow-up:  Return to clinic in 3 months or sooner if needed.  Sonia Baller Ashok Cordia, M.D. [facility] Pulmonary & Critical Care Pager:  [phone] After 3pm or if no response, call [phone] 2:46 PM 06/07/16

## 2016-06-07 NOTE — Addendum Note (Signed)
Addended by: Inge Rise on: 06/07/2016 03:28 PM   Modules accepted: Orders

## 2016-06-07 NOTE — Patient Instructions (Signed)
   Continue using your Symbicort, Daliresp & Ipratroprium Bromide in your nebulizer as prescribed.  Pay particular attention to whether or not your symptoms improve on the Daliresp.  Call me if you feel you are getting worse or have to start you Prednisone.  I will see you back in 3 months after your CT scan or sooner if needed.  TESTS ORDERED: 1. CT Chest w/o November 2017

## 2016-06-09 ENCOUNTER — Other Ambulatory Visit: Payer: Self-pay | Admitting: Pulmonary Disease

## 2016-06-16 DIAGNOSIS — J449 Chronic obstructive pulmonary disease, unspecified: Secondary | ICD-10-CM | POA: Diagnosis not present

## 2016-06-19 ENCOUNTER — Other Ambulatory Visit: Payer: Self-pay | Admitting: Family Medicine

## 2016-06-19 ENCOUNTER — Telehealth: Payer: Self-pay | Admitting: Family Medicine

## 2016-06-19 DIAGNOSIS — G894 Chronic pain syndrome: Secondary | ICD-10-CM

## 2016-06-19 DIAGNOSIS — M5416 Radiculopathy, lumbar region: Secondary | ICD-10-CM

## 2016-06-19 MED ORDER — MORPHINE SULFATE 15 MG PO TABS: 15.0000 mg | ORAL_TABLET | Freq: Three times a day (TID) | ORAL | 0 refills | Status: DC

## 2016-06-19 NOTE — Telephone Encounter (Signed)
See below

## 2016-06-19 NOTE — Progress Notes (Signed)
Patient aware Rx is ready for pick up at the front desk.

## 2016-06-19 NOTE — Telephone Encounter (Signed)
Pt needs new rx morphine

## 2016-06-22 ENCOUNTER — Other Ambulatory Visit: Payer: Self-pay | Admitting: Cardiology

## 2016-06-22 NOTE — Telephone Encounter (Signed)
Rx(s) sent to pharmacy electronically.  

## 2016-06-26 ENCOUNTER — Other Ambulatory Visit: Payer: Self-pay | Admitting: Family Medicine

## 2016-06-27 NOTE — Telephone Encounter (Signed)
Last seen 07.14.17. Last sent in 07.03.17 #90 with 2 refills.  Okay to refill?

## 2016-06-29 ENCOUNTER — Other Ambulatory Visit: Payer: Self-pay | Admitting: Family Medicine

## 2016-06-29 NOTE — Telephone Encounter (Signed)
Alprazolam 0.25 mg to continue 1 tab tid as needed can be called in , # 90/2. Thanks, BJ

## 2016-06-29 NOTE — Telephone Encounter (Signed)
Rx called in 

## 2016-07-02 ENCOUNTER — Telehealth: Payer: Self-pay | Admitting: Internal Medicine

## 2016-07-06 NOTE — Telephone Encounter (Signed)
Rerouted to dr Martinique

## 2016-07-07 NOTE — Telephone Encounter (Signed)
Rx for Isordil 30 mg to continue 1/2 tab daily can be sent to pharmacy, # 45/1. Thanks, BJ

## 2016-07-12 ENCOUNTER — Other Ambulatory Visit: Payer: Self-pay | Admitting: Family Medicine

## 2016-07-12 DIAGNOSIS — G4700 Insomnia, unspecified: Secondary | ICD-10-CM

## 2016-07-12 DIAGNOSIS — F419 Anxiety disorder, unspecified: Secondary | ICD-10-CM

## 2016-07-16 DIAGNOSIS — J449 Chronic obstructive pulmonary disease, unspecified: Secondary | ICD-10-CM | POA: Diagnosis not present

## 2016-07-19 ENCOUNTER — Telehealth: Payer: Self-pay | Admitting: Family Medicine

## 2016-07-19 DIAGNOSIS — M5416 Radiculopathy, lumbar region: Secondary | ICD-10-CM

## 2016-07-19 DIAGNOSIS — G894 Chronic pain syndrome: Secondary | ICD-10-CM

## 2016-07-19 NOTE — Telephone Encounter (Signed)
Pt need new Rx for morphine

## 2016-07-20 MED ORDER — MORPHINE SULFATE 15 MG PO TABS: 15.0000 mg | ORAL_TABLET | Freq: Three times a day (TID) | ORAL | 0 refills | Status: DC

## 2016-07-20 NOTE — Telephone Encounter (Signed)
Rx printed for MD signature

## 2016-07-20 NOTE — Telephone Encounter (Signed)
Rx for Morphine can be printed, he is due for refill tomorrow. Morphine Sulfate IR 15 mg tid as needed # 90/0. Thanks, BJ

## 2016-07-20 NOTE — Telephone Encounter (Signed)
Patient aware Rx is ready & up front to be picked up.

## 2016-07-26 ENCOUNTER — Ambulatory Visit (INDEPENDENT_AMBULATORY_CARE_PROVIDER_SITE_OTHER): Payer: Commercial Managed Care - HMO | Admitting: Pulmonary Disease

## 2016-07-26 ENCOUNTER — Ambulatory Visit (INDEPENDENT_AMBULATORY_CARE_PROVIDER_SITE_OTHER)
Admission: RE | Admit: 2016-07-26 | Discharge: 2016-07-26 | Disposition: A | Discharge status: Home / Self Care | Payer: Commercial Managed Care - HMO | Source: Ambulatory Visit | Attending: Pulmonary Disease | Admitting: Pulmonary Disease

## 2016-07-26 ENCOUNTER — Encounter: Payer: Self-pay | Admitting: Pulmonary Disease

## 2016-07-26 VITALS — BP 128/74 | HR 67 | Temp 97.6°F | Ht 67.0 in | Wt 168.8 lb

## 2016-07-26 DIAGNOSIS — J441 Chronic obstructive pulmonary disease with (acute) exacerbation: Secondary | ICD-10-CM

## 2016-07-26 DIAGNOSIS — J449 Chronic obstructive pulmonary disease, unspecified: Secondary | ICD-10-CM | POA: Diagnosis not present

## 2016-07-26 MED ORDER — AZITHROMYCIN 500 MG PO TABS: ORAL_TABLET | ORAL | 0 refills | Status: DC

## 2016-07-26 MED ORDER — BUDESONIDE-FORMOTEROL FUMARATE 160-4.5 MCG/ACT IN AERO: 2.0000 | INHALATION_SPRAY | Freq: Two times a day (BID) | RESPIRATORY_TRACT | 0 refills | Status: DC

## 2016-07-26 MED ORDER — PREDNISONE 10 MG PO TABS: ORAL_TABLET | ORAL | 0 refills | Status: DC

## 2016-07-26 NOTE — Progress Notes (Deleted)
[name]    [id number]    Feb 10, 1951  Primary Care Physician:Jason Martinique, MD  Referring Physician: Betty G Martinique, MD [name], [name] [id number]  Chief complaint:  ***  HPI:  ***   Outpatient Encounter Prescriptions as of 07/26/2016  Medication Sig  . albuterol (PROVENTIL HFA;VENTOLIN HFA) 108 (90 Base) MCG/ACT inhaler Inhale 2 puffs into the lungs every 6 (six) hours as needed for wheezing or shortness of breath.  Marland Kitchen albuterol (PROVENTIL) (5 MG/ML) 0.5% nebulizer solution TAKE 0.5 ML(S) BY NEBULIZATION EVERY FOUR HOURS AS NEEDED  . ALPRAZolam (XANAX) 0.25 MG tablet TAKE ONE TABLET BY MOUTH THREE TIMES DAILY AS NEEDED FOR ANXIETY  . amitriptyline (ELAVIL) 25 MG tablet Take 0.5 tablets (12.5 mg total) by mouth at bedtime.  Marland Kitchen aspirin 325 MG tablet Take 325 mg by mouth daily.   Marland Kitchen atorvastatin (LIPITOR) 80 MG tablet Take 1 tablet (80 mg total) by mouth daily. PLEASE CONTACT OFFICE FOR ADDITIONAL REFILLS  . b complex vitamins tablet Take 1 tablet by mouth daily.   . bisoprolol (ZEBETA) 5 MG tablet TAKE 1 TABLET EVERY DAY  . budesonide-formoterol (SYMBICORT) 160-4.5 MCG/ACT inhaler Take 2 puffs first thing in am and then another 2 puffs about 12 hours later.  . budesonide-formoterol (SYMBICORT) 160-4.5 MCG/ACT inhaler Inhale 2 puffs into the lungs 2 (two) times daily.  Marland Kitchen DALIRESP 500 MCG TABS tablet TAKE ONE TABLET BY MOUTH ONCE DAILY  . Ferrous Sulfate (IRON) 325 (65 FE) MG TABS Take 1 tablet by mouth daily.  . fluticasone (FLONASE) 50 MCG/ACT nasal spray USE TWO SPRAY(S) IN EACH NOSTRIL ONCE DAILY  . furosemide (LASIX) 20 MG tablet TAKE 1 TABLET EVERY DAY  . glucose blood test strip Test once daily E11.9  . hydrALAZINE (APRESOLINE) 10 MG tablet TAKE 1 TABLET THREE TIMES DAILY  . ipratropium (ATROVENT) 0.02 % nebulizer solution INHALE  2.5 ML THREE TIMES DAILY (Patient taking differently: INHALE  2.5 ML FOUR TIMES DAILY)  . ipratropium (ATROVENT)  0.02 % nebulizer solution INHALE ONE VIAL IN NEBULIZER EVERY 4 HOURS AS NEEDED  . ipratropium-albuterol (DUONEB) 0.5-2.5 (3) MG/3ML SOLN Take 3 mLs by nebulization every 6 (six) hours. Use 4 times daily x 4 days, then every 6 hours as needed.  . isosorbide dinitrate (ISORDIL) 30 MG tablet TAKE 1/2 TABLET EVERY DAY  . KRILL OIL 1000 MG CAPS Take 1 capsule by mouth daily.  . Lancets (ACCU-CHEK SOFT TOUCH) lancets Test once daily Dx E11.9  . morphine (MSIR) 15 MG tablet Take 1 tablet (15 mg total) by mouth 3 (three) times daily.  . multivitamin (THERAGRAN) per tablet Take 1 tablet by mouth daily.   . polyethylene glycol (MIRALAX / GLYCOLAX) packet Take 17 g by mouth daily. (Patient taking differently: Take 17 g by mouth daily as needed. )  . predniSONE (DELTASONE) 20 MG tablet Take 2 tablets (40 mg total) by mouth daily with breakfast.  . Respiratory Therapy Supplies (FLUTTER) DEVI Use as directed.  . roflumilast (DALIRESP) 500 MCG TABS tablet Take 1 tablet (500 mcg total) by mouth daily.  . traZODone (DESYREL) 50 MG tablet TAKE ONE-HALF TO ONE TABLET BY MOUTH AT BEDTIME AS NEEDED FOR SLEEP   No facility-administered encounter medications on file as of 07/26/2016.     Allergies as of 07/26/2016 - Review Complete 07/26/2016  Allergen Reaction Noted  . Vancomycin  08/08/2011  . Beta adrenergic blockers Other (See Comments) 02/24/2015  .  Cephalexin    . Moxifloxacin  02/17/2010  . Penicillins  07/26/2007    Past Medical History:  Diagnosis Date  . AAA (abdominal aortic aneurysm) (HCC)    5.0 X 4.8;  followed by vascular surgery  . Anemia   . Carotid stenosis    carotids 09/2011 bilateral 60-79% ICA stenosis  . COPD (chronic obstructive pulmonary disease) (HCC)    Golds Stage II- Fev1 73% , FVC 49%, Rv 135%, DLCO 68%-03/2007  . Coronary artery disease 11/12   LHC 08/04/11:normal LM. The proximal LAD just beyond the ostium of this diagonal had a long 90% stenosis with TIMI 2 flow. The LAD  was also collateralized by the RCA. No disease in the Lcx. 50% mid RCA stenosis. The RCA gives collaterals to the LAD. LV EF appeared normal, estimate 55%, no definite wall motion abnormalities.  PCI was attempted of his LAD but could not be crossed - med Tx  . Hepatitis, alcoholic    January 123456  . History of hypokalemia    secondary to alcohol abuse January 2006  . Hyperlipidemia   . Hypertension   . Hypomagnesemia    secondary to alcohol abuse January 2006  . Ischemic cardiomyopathy    echo 09/2011: EF 35-40%, akinesis of the distal LV, moderate LVH, grade 1 diastolic dysfunction, moderate LAE, PASP 34.  Marland Kitchen Left eye trauma    status post 15 years agoto the left eye  . Lumbar back pain   . Migraine headache   . Myocardial infarction 11/12  . PAD (peripheral artery disease) (HCC)    with intermittent claudication, bilateral SFA occlusion  . Pneumonia Oc. 2012  . Renal artery stenosis (HCC)    Abdominal ultrasound 03/2010:4.7 x 5.0 cm AAA, critical right renal artery stenosis and greater than 60% left renal artery stenosis  . Subclavian artery stenosis, left (HCC)    left subclavian stenosis by ultrasound   . Visual field cut    right with supranasal quadrantopia to retinal artery occlusion    Past Surgical History:  Procedure Laterality Date  . LEFT HEART CATHETERIZATION WITH CORONARY ANGIOGRAM N/A 08/11/2011   Procedure: LEFT HEART CATHETERIZATION WITH CORONARY ANGIOGRAM;  Surgeon: Jason Dresser, MD;  Location: [name];  Service: Cardiovascular;  Laterality: N/A;  . PERCUTANEOUS CORONARY STENT INTERVENTION (PCI-S) N/A 08/11/2011   Procedure: PERCUTANEOUS CORONARY STENT INTERVENTION (PCI-S);  Surgeon: Jason M Martinique, MD;  Location: [name] CATH LAB;  Service: Cardiovascular;  Laterality: N/A;  . TONSILLECTOMY AND ADENOIDECTOMY  1958    Family History  Problem Relation Age of Onset  . Throat cancer Mother     died young age secondary to throat cancer  . Cancer Mother     Throat   . Hypertension Sister   . Diabetes Daughter   . Heart disease Father   . Hyperlipidemia Father   . Hypertension Father   . Heart attack Father     Social History   Social History  . Marital status: Married    Spouse name: N/A  . Number of children: N/A  . Years of education: N/A   Occupational History  . 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 Smoking status: Former Smoker    Packs/day: 1.00    Years: 55.00    Types: Cigarettes    Quit date: 10/15/2015  . Smokeless tobacco: Never Used  . Alcohol use No     Comment: fomer abuse quit 2006  .  Drug use: No  . Sexual activity: Not on file   Other Topics Concern  . Not on file   Social History Narrative   Occupation: retired, unemployed since 06/2010   Married with two grown daughters    current smoker - using e cigarettes   Alcohol use-no; former abuse quit 2006    Smoking Status:  current              Review of systems: Review of Systems  Constitutional: Negative for fever and chills.  HENT: Negative.   Eyes: Negative for blurred vision.  Respiratory: as per HPI  Cardiovascular: Negative for chest pain and palpitations.  Gastrointestinal: Negative for vomiting, diarrhea, blood per rectum. Genitourinary: Negative for dysuria, urgency, frequency and hematuria.  Musculoskeletal: Negative for myalgias, back pain and joint pain.  Skin: Negative for itching and rash.  Neurological: Negative for dizziness, tremors, focal weakness, seizures and loss of consciousness.  Endo/Heme/Allergies: Negative for environmental allergies.  Psychiatric/Behavioral: Negative for depression, suicidal ideas and hallucinations.  All other systems reviewed and are negative.   Physical Exam: Blood pressure 128/74, pulse 67, temperature 97.6 F (36.4 C), temperature source Oral, height 5\' 7"  (1.702 m), weight 168 lb 12.8 oz (76.6 kg), SpO2 95 %. Gen:      No acute distress HEENT:  EOMI,  sclera anicteric Neck:     No masses; no thyromegaly Lungs:    Clear to auscultation bilaterally; normal respiratory effort*** CV:         Regular rate and rhythm; no murmurs Abd:      + bowel sounds; soft, non-tender; no palpable masses, no distension Ext:    No edema; adequate peripheral perfusion Skin:      Warm and dry; no rash Neuro: alert and oriented x 3 Psych: normal mood and affect  Data Reviewed: ***   Assessment:  ***  Plan/Recommendations: ***   Marshell Garfinkel MD [place] [facility] Pager [phone] 07/26/2016, 10:43 AM  CC: Young, Jason G, MD

## 2016-07-26 NOTE — Patient Instructions (Signed)
We will send you for a chest x ray today. Prednisone taper starting at 60 mg . Reduce dose by 10 mg every 5 days. We will give azithromycin 500 mg today and then 250 mg every day for 4 days  Follow up with Dr. Ashok Cordia next month.
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
## 2016-07-27 ENCOUNTER — Other Ambulatory Visit: Payer: Self-pay | Admitting: Pulmonary Disease

## 2016-07-30 ENCOUNTER — Encounter (HOSPITAL_COMMUNITY): Payer: Self-pay | Admitting: *Deleted

## 2016-07-30 ENCOUNTER — Emergency Department (HOSPITAL_COMMUNITY): Payer: Commercial Managed Care - HMO

## 2016-07-30 ENCOUNTER — Inpatient Hospital Stay (HOSPITAL_COMMUNITY)
Admission: EM | Admit: 2016-07-30 | Discharge: 2016-08-05 | DRG: 192 | Disposition: A | Discharge status: Home / Self Care | Payer: Commercial Managed Care - HMO | Attending: Family Medicine | Admitting: Family Medicine

## 2016-07-30 DIAGNOSIS — Z7982 Long term (current) use of aspirin: Secondary | ICD-10-CM | POA: Diagnosis not present

## 2016-07-30 DIAGNOSIS — Z7951 Long term (current) use of inhaled steroids: Secondary | ICD-10-CM | POA: Diagnosis not present

## 2016-07-30 DIAGNOSIS — I679 Cerebrovascular disease, unspecified: Secondary | ICD-10-CM | POA: Diagnosis not present

## 2016-07-30 DIAGNOSIS — I2510 Atherosclerotic heart disease of native coronary artery without angina pectoris: Secondary | ICD-10-CM | POA: Diagnosis not present

## 2016-07-30 DIAGNOSIS — J441 Chronic obstructive pulmonary disease with (acute) exacerbation: Secondary | ICD-10-CM | POA: Diagnosis not present

## 2016-07-30 DIAGNOSIS — I714 Abdominal aortic aneurysm, without rupture, unspecified: Secondary | ICD-10-CM

## 2016-07-30 DIAGNOSIS — Z79899 Other long term (current) drug therapy: Secondary | ICD-10-CM

## 2016-07-30 DIAGNOSIS — Z87891 Personal history of nicotine dependence: Secondary | ICD-10-CM | POA: Diagnosis not present

## 2016-07-30 DIAGNOSIS — Z8249 Family history of ischemic heart disease and other diseases of the circulatory system: Secondary | ICD-10-CM | POA: Diagnosis not present

## 2016-07-30 DIAGNOSIS — I10 Essential (primary) hypertension: Secondary | ICD-10-CM | POA: Diagnosis present

## 2016-07-30 DIAGNOSIS — I4891 Unspecified atrial fibrillation: Secondary | ICD-10-CM | POA: Diagnosis present

## 2016-07-30 DIAGNOSIS — Z880 Allergy status to penicillin: Secondary | ICD-10-CM

## 2016-07-30 DIAGNOSIS — J449 Chronic obstructive pulmonary disease, unspecified: Secondary | ICD-10-CM

## 2016-07-30 DIAGNOSIS — Z7984 Long term (current) use of oral hypoglycemic drugs: Secondary | ICD-10-CM

## 2016-07-30 DIAGNOSIS — I255 Ischemic cardiomyopathy: Secondary | ICD-10-CM | POA: Diagnosis present

## 2016-07-30 DIAGNOSIS — Z23 Encounter for immunization: Secondary | ICD-10-CM

## 2016-07-30 DIAGNOSIS — Z66 Do not resuscitate: Secondary | ICD-10-CM | POA: Diagnosis not present

## 2016-07-30 DIAGNOSIS — I739 Peripheral vascular disease, unspecified: Secondary | ICD-10-CM | POA: Diagnosis present

## 2016-07-30 DIAGNOSIS — M545 Low back pain: Secondary | ICD-10-CM | POA: Diagnosis present

## 2016-07-30 DIAGNOSIS — E785 Hyperlipidemia, unspecified: Secondary | ICD-10-CM | POA: Diagnosis not present

## 2016-07-30 DIAGNOSIS — Z888 Allergy status to other drugs, medicaments and biological substances status: Secondary | ICD-10-CM

## 2016-07-30 DIAGNOSIS — Z955 Presence of coronary angioplasty implant and graft: Secondary | ICD-10-CM

## 2016-07-30 DIAGNOSIS — I519 Heart disease, unspecified: Secondary | ICD-10-CM

## 2016-07-30 DIAGNOSIS — R0600 Dyspnea, unspecified: Secondary | ICD-10-CM | POA: Diagnosis not present

## 2016-07-30 DIAGNOSIS — R0602 Shortness of breath: Secondary | ICD-10-CM | POA: Diagnosis not present

## 2016-07-30 DIAGNOSIS — K5730 Diverticulosis of large intestine without perforation or abscess without bleeding: Secondary | ICD-10-CM | POA: Diagnosis not present

## 2016-07-30 HISTORY — DX: Gastro-esophageal reflux disease without esophagitis: K21.9

## 2016-07-30 LAB — COMPREHENSIVE METABOLIC PANEL
ALBUMIN: 4.6 g/dL (ref 3.5–5.0)
ALK PHOS: 103 U/L (ref 38–126)
ALT: 44 U/L (ref 17–63)
AST: 32 U/L (ref 15–41)
Anion gap: 11 (ref 5–15)
BUN: 23 mg/dL — AB (ref 6–20)
CALCIUM: 9.8 mg/dL (ref 8.9–10.3)
CHLORIDE: 96 mmol/L — AB (ref 101–111)
CO2: 29 mmol/L (ref 22–32)
CREATININE: 1.17 mg/dL (ref 0.61–1.24)
GFR calc Af Amer: >60 mL/min (ref >60)
GFR calc non Af Amer: >60 mL/min (ref >60)
GLUCOSE: 93 mg/dL (ref 65–99)
Potassium: 3.7 mmol/L (ref 3.5–5.1)
SODIUM: 136 mmol/L (ref 135–145)
Total Bilirubin: 1.2 mg/dL (ref 0.3–1.2)
Total Protein: 7.5 g/dL (ref 6.5–8.1)

## 2016-07-30 LAB — I-STAT CHEM 8, ED
BUN: 23 mg/dL — AB (ref 6–20)
CALCIUM ION: 1.1 mmol/L — AB (ref 1.15–1.40)
CHLORIDE: 96 mmol/L — AB (ref 101–111)
CREATININE: 1.2 mg/dL (ref 0.61–1.24)
Glucose, Bld: 96 mg/dL (ref 65–99)
HCT: 44 % (ref 39.0–52.0)
Hemoglobin: 15 g/dL (ref 13.0–17.0)
Potassium: 3.6 mmol/L (ref 3.5–5.1)
Sodium: 134 mmol/L — ABNORMAL LOW (ref 135–145)
TCO2: 29 mmol/L (ref 0–100)

## 2016-07-30 LAB — CREATININE, SERUM
Creatinine, Ser: 1.43 mg/dL — ABNORMAL HIGH (ref 0.61–1.24)
GFR calc non Af Amer: 49 mL/min — ABNORMAL LOW (ref >60)
GFR, EST AFRICAN AMERICAN: 57 mL/min — AB (ref >60)

## 2016-07-30 LAB — CBC WITH DIFFERENTIAL/PLATELET
BASOS ABS: 0 10*3/uL (ref 0.0–0.1)
Basophils Relative: 0 %
Eosinophils Absolute: 0 10*3/uL (ref 0.0–0.7)
Eosinophils Relative: 0 %
HEMATOCRIT: 43.3 % (ref 39.0–52.0)
HEMOGLOBIN: 14.4 g/dL (ref 13.0–17.0)
LYMPHS PCT: 18 %
Lymphs Abs: 2.4 10*3/uL (ref 0.7–4.0)
MCH: 30.4 pg (ref 26.0–34.0)
MCHC: 33.3 g/dL (ref 30.0–36.0)
MCV: 91.4 fL (ref 78.0–100.0)
MONO ABS: 1.2 10*3/uL — AB (ref 0.1–1.0)
Monocytes Relative: 9 %
NEUTROS ABS: 9.9 10*3/uL — AB (ref 1.7–7.7)
NEUTROS PCT: 73 %
Platelets: 194 10*3/uL (ref 150–400)
RBC: 4.74 MIL/uL (ref 4.22–5.81)
RDW: 14.8 % (ref 11.5–15.5)
WBC: 13.5 10*3/uL — ABNORMAL HIGH (ref 4.0–10.5)

## 2016-07-30 LAB — BLOOD GAS, VENOUS
ACID-BASE EXCESS: 6 mmol/L — AB (ref 0.0–2.0)
BICARBONATE: 30.5 mmol/L — AB (ref 20.0–28.0)
O2 SAT: 85.2 %
Patient temperature: 98.6
pCO2, Ven: 45 mmHg (ref 44.0–60.0)
pH, Ven: 7.446 — ABNORMAL HIGH (ref 7.250–7.430)
pO2, Ven: 50.5 mmHg — ABNORMAL HIGH (ref 32.0–45.0)

## 2016-07-30 LAB — CBC
HCT: 43.7 % (ref 39.0–52.0)
Hemoglobin: 14.5 g/dL (ref 13.0–17.0)
MCH: 30.3 pg (ref 26.0–34.0)
MCHC: 33.2 g/dL (ref 30.0–36.0)
MCV: 91.2 fL (ref 78.0–100.0)
PLATELETS: 186 10*3/uL (ref 150–400)
RBC: 4.79 MIL/uL (ref 4.22–5.81)
RDW: 14.7 % (ref 11.5–15.5)
WBC: 9.6 10*3/uL (ref 4.0–10.5)

## 2016-07-30 LAB — I-STAT TROPONIN, ED: Troponin i, poc: 0.04 ng/mL (ref 0.00–0.08)

## 2016-07-30 LAB — APTT: APTT: 32 s (ref 24–36)

## 2016-07-30 LAB — PROTIME-INR
INR: 0.92
Prothrombin Time: 12.3 seconds (ref 11.4–15.2)

## 2016-07-30 LAB — BRAIN NATRIURETIC PEPTIDE: B Natriuretic Peptide: 973.7 pg/mL — ABNORMAL HIGH (ref 0.0–100.0)

## 2016-07-30 MED ORDER — ONDANSETRON HCL 4 MG/2ML IJ SOLN
4.0000 mg | Freq: Once | INTRAMUSCULAR | Status: AC
  Administered 2016-07-30: 4 mg via INTRAVENOUS
  Filled 2016-07-30: qty 2

## 2016-07-30 MED ORDER — AMITRIPTYLINE HCL 25 MG PO TABS: 12.5000 mg | ORAL_TABLET | Freq: Every day | ORAL | Status: DC

## 2016-07-30 MED ORDER — IPRATROPIUM-ALBUTEROL 0.5-2.5 (3) MG/3ML IN SOLN
3.0000 mL | RESPIRATORY_TRACT | Status: AC
  Administered 2016-07-30 (×3): 3 mL via RESPIRATORY_TRACT
  Filled 2016-07-30: qty 9

## 2016-07-30 MED ORDER — MOMETASONE FURO-FORMOTEROL FUM 200-5 MCG/ACT IN AERO
2.0000 | INHALATION_SPRAY | Freq: Two times a day (BID) | RESPIRATORY_TRACT | Status: DC
  Administered 2016-07-31 – 2016-08-05 (×11): 2 via RESPIRATORY_TRACT
  Filled 2016-07-30 (×2): qty 8.8

## 2016-07-30 MED ORDER — ALPRAZOLAM 0.25 MG PO TABS
0.2500 mg | ORAL_TABLET | Freq: Three times a day (TID) | ORAL | Status: DC | PRN
  Administered 2016-07-31 – 2016-08-05 (×10): 0.25 mg via ORAL
  Filled 2016-07-30 (×11): qty 1

## 2016-07-30 MED ORDER — HEPARIN SODIUM (PORCINE) 5000 UNIT/ML IJ SOLN
5000.0000 [IU] | Freq: Three times a day (TID) | INTRAMUSCULAR | Status: DC
  Administered 2016-07-31 – 2016-08-05 (×17): 5000 [IU] via SUBCUTANEOUS
  Filled 2016-07-30 (×16): qty 1

## 2016-07-30 MED ORDER — BISOPROLOL FUMARATE 5 MG PO TABS
5.0000 mg | ORAL_TABLET | Freq: Every day | ORAL | Status: DC
  Administered 2016-07-30 – 2016-08-05 (×7): 5 mg via ORAL
  Filled 2016-07-30 (×7): qty 1

## 2016-07-30 MED ORDER — HEPARIN (PORCINE) IN NACL 100-0.45 UNIT/ML-% IJ SOLN
1050.0000 [IU]/h | INTRAMUSCULAR | Status: DC
  Filled 2016-07-30: qty 250

## 2016-07-30 MED ORDER — ROFLUMILAST 500 MCG PO TABS
500.0000 ug | ORAL_TABLET | Freq: Every day | ORAL | Status: DC
  Administered 2016-07-30 – 2016-08-05 (×7): 500 ug via ORAL
  Filled 2016-07-30 (×7): qty 1

## 2016-07-30 MED ORDER — IPRATROPIUM-ALBUTEROL 0.5-2.5 (3) MG/3ML IN SOLN: 3.0000 mL | Freq: Four times a day (QID) | RESPIRATORY_TRACT | Status: DC

## 2016-07-30 MED ORDER — MORPHINE SULFATE (PF) 2 MG/ML IV SOLN
2.0000 mg | Freq: Once | INTRAVENOUS | Status: AC
  Administered 2016-07-30: 2 mg via INTRAVENOUS
  Filled 2016-07-30: qty 1

## 2016-07-30 MED ORDER — SODIUM CHLORIDE 0.9% FLUSH
3.0000 mL | Freq: Two times a day (BID) | INTRAVENOUS | Status: DC
  Administered 2016-07-30 – 2016-08-05 (×11): 3 mL via INTRAVENOUS

## 2016-07-30 MED ORDER — DILTIAZEM HCL 25 MG/5ML IV SOLN
20.0000 mg | Freq: Once | INTRAVENOUS | Status: AC
Start: 2016-07-30 — End: 2016-07-30
  Administered 2016-07-30: 20 mg via INTRAVENOUS
  Filled 2016-07-30: qty 5

## 2016-07-30 MED ORDER — METHYLPREDNISOLONE SODIUM SUCC 125 MG IJ SOLR
80.0000 mg | Freq: Three times a day (TID) | INTRAMUSCULAR | Status: DC
  Administered 2016-07-30 – 2016-08-02 (×9): 80 mg via INTRAVENOUS
  Filled 2016-07-30 (×9): qty 2

## 2016-07-30 MED ORDER — FENTANYL CITRATE (PF) 100 MCG/2ML IJ SOLN
50.0000 ug | Freq: Four times a day (QID) | INTRAMUSCULAR | Status: DC | PRN
  Administered 2016-07-30 – 2016-08-02 (×7): 50 ug via INTRAVENOUS
  Filled 2016-07-30 (×8): qty 2

## 2016-07-30 MED ORDER — POLYETHYLENE GLYCOL 3350 17 G PO PACK: 17.0000 g | PACK | Freq: Every day | ORAL | Status: DC | PRN

## 2016-07-30 MED ORDER — HEPARIN BOLUS VIA INFUSION
3800.0000 [IU] | INTRAVENOUS | Status: DC
  Filled 2016-07-30: qty 3800

## 2016-07-30 MED ORDER — ALBUTEROL SULFATE (2.5 MG/3ML) 0.083% IN NEBU
2.5000 mg | INHALATION_SOLUTION | RESPIRATORY_TRACT | Status: DC | PRN
  Administered 2016-07-31: 2.5 mg via RESPIRATORY_TRACT
  Filled 2016-07-30 (×2): qty 3

## 2016-07-30 MED ORDER — DOXYCYCLINE HYCLATE 100 MG PO TABS
100.0000 mg | ORAL_TABLET | Freq: Two times a day (BID) | ORAL | Status: DC
  Administered 2016-07-30 – 2016-08-05 (×13): 100 mg via ORAL
  Filled 2016-07-30 (×13): qty 1

## 2016-07-30 MED ORDER — PREDNISONE 20 MG PO TABS
60.0000 mg | ORAL_TABLET | Freq: Once | ORAL | Status: AC
  Administered 2016-07-30: 60 mg via ORAL
  Filled 2016-07-30: qty 3

## 2016-07-30 MED ORDER — MORPHINE SULFATE 15 MG PO TABS
15.0000 mg | ORAL_TABLET | Freq: Three times a day (TID) | ORAL | Status: DC
  Administered 2016-07-30 – 2016-08-02 (×9): 15 mg via ORAL
  Filled 2016-07-30 (×10): qty 1

## 2016-07-30 MED ORDER — ONDANSETRON HCL 4 MG/2ML IJ SOLN
4.0000 mg | Freq: Once | INTRAMUSCULAR | Status: AC
Start: 2016-07-30 — End: 2016-07-30
  Administered 2016-07-30: 4 mg via INTRAVENOUS
  Filled 2016-07-30: qty 2

## 2016-07-30 MED ORDER — IOPAMIDOL (ISOVUE-300) INJECTION 61%
15.0000 mL | Freq: Once | INTRAVENOUS | Status: AC | PRN
  Administered 2016-07-30: 15 mL via ORAL

## 2016-07-30 MED ORDER — ISOSORBIDE DINITRATE 10 MG PO TABS
15.0000 mg | ORAL_TABLET | Freq: Every day | ORAL | Status: DC
  Administered 2016-07-30 – 2016-08-05 (×7): 15 mg via ORAL
  Filled 2016-07-30 (×7): qty 2

## 2016-07-30 MED ORDER — MORPHINE SULFATE (PF) 2 MG/ML IV SOLN
4.0000 mg | Freq: Once | INTRAVENOUS | Status: AC
  Administered 2016-07-30: 4 mg via INTRAVENOUS
  Filled 2016-07-30: qty 2

## 2016-07-30 MED ORDER — ATORVASTATIN CALCIUM 80 MG PO TABS
80.0000 mg | ORAL_TABLET | Freq: Every day | ORAL | Status: DC
  Administered 2016-07-30 – 2016-08-05 (×7): 80 mg via ORAL
  Filled 2016-07-30 (×7): qty 1

## 2016-07-30 MED ORDER — ALBUTEROL (5 MG/ML) CONTINUOUS INHALATION SOLN
7.5000 mg/h | INHALATION_SOLUTION | RESPIRATORY_TRACT | Status: DC
  Administered 2016-07-30: 7.5 mg/h via RESPIRATORY_TRACT
  Filled 2016-07-30: qty 20

## 2016-07-30 MED ORDER — HYDRALAZINE HCL 10 MG PO TABS
10.0000 mg | ORAL_TABLET | Freq: Three times a day (TID) | ORAL | Status: DC
  Administered 2016-07-30 – 2016-08-05 (×17): 10 mg via ORAL
  Filled 2016-07-30 (×17): qty 1

## 2016-07-30 MED ORDER — FLUTICASONE PROPIONATE 50 MCG/ACT NA SUSP: 1.0000 | Freq: Every day | NASAL | Status: DC

## 2016-07-30 MED ORDER — MAGNESIUM SULFATE 2 GM/50ML IV SOLN
2.0000 g | Freq: Once | INTRAVENOUS | Status: AC
  Administered 2016-07-30: 2 g via INTRAVENOUS
  Filled 2016-07-30: qty 50

## 2016-07-30 MED ORDER — IOPAMIDOL (ISOVUE-370) INJECTION 76%
100.0000 mL | Freq: Once | INTRAVENOUS | Status: AC | PRN
  Administered 2016-07-30: 100 mL via INTRAVENOUS

## 2016-07-30 NOTE — ED Notes (Signed)
Nurse is in the room starting an IV and collecting labs

## 2016-07-30 NOTE — ED Notes (Signed)
2 unsucessful IV attempts by this RN.  Will ask another RN to try.

## 2016-07-30 NOTE — ED Triage Notes (Signed)
Pt reports a COPD flare up that began last week. Pt reports being unable sleep for the past three days.  Pt has been having difficulty breathing.  Pt also reports bilateral flank pain.  Pt currently in the tripod position and breathing with an O2 sat at 91% RA.  Pt normally on home O2 2L [place] at night. Pt denies any urinary symptoms.

## 2016-07-30 NOTE — ED Provider Notes (Signed)
[facility] DEPT Provider Note   CSN: [id number] Arrival date & time: 07/30/16  0831     History   Chief Complaint Chief Complaint  Patient presents with  . Shortness of Breath    HPI [name] is a 65 y.o. male.  65 yo M With a chief complaint of shortness of breath. Is been going on for the past 3 days. He was seen by his family physician and started on steroids and increased his inhaler. Since then he has been having worsening bilateral flank pain. Denies any fevers or chills. Has been having worsening shortness of breath as well. Has right lower extremity swelling greater than left. Denies a history of this as well. Denies syncope denies chest pain.   The history is provided by the patient.  Shortness of Breath  This is a new problem. The average episode lasts 3 days. The problem occurs continuously.The current episode started more than 2 days ago. The problem has not changed since onset.Pertinent negatives include no fever, no headaches, no chest pain, no vomiting, no abdominal pain and no rash. He has tried oral steroids, ipratropium inhalers, inhaled steroids and beta-agonist inhalers for the symptoms. The treatment provided no relief. He has had prior hospitalizations. He has had prior ED visits. He has had no prior ICU admissions.    Past Medical History:  Diagnosis Date  . AAA (abdominal aortic aneurysm) (HCC)    5.0 X 4.8;  followed by vascular surgery  . Anemia   . Carotid stenosis    carotids 09/2011 bilateral 60-79% ICA stenosis  . COPD (chronic obstructive pulmonary disease) (HCC)    Golds Stage II- Fev1 73% , FVC 49%, Rv 135%, DLCO 68%-03/2007  . Coronary artery disease 11/12   LHC 08/04/11:normal LM. The proximal LAD just beyond the ostium of this diagonal had a long 90% stenosis with TIMI 2 flow. The LAD was also collateralized by the RCA. No disease in the Lcx. 50% mid RCA stenosis. The RCA gives collaterals to the LAD. LV EF appeared normal, estimate  55%, no definite wall motion abnormalities.  PCI was attempted of his LAD but could not be crossed - med Tx  . Hepatitis, alcoholic    January [id number]  . History of hypokalemia    secondary to alcohol abuse January 2006  . Hyperlipidemia   . Hypertension   . Hypomagnesemia    secondary to alcohol abuse January 2006  . Ischemic cardiomyopathy    echo 09/2011: EF 35-40%, akinesis of the distal LV, moderate LVH, grade 1 diastolic dysfunction, moderate LAE, PASP 34.  Marland Kitchen Left eye trauma    status post 15 years agoto the left eye  . Lumbar back pain   . Migraine headache   . Myocardial infarction 11/12  . PAD (peripheral artery disease) (HCC)    with intermittent claudication, bilateral SFA occlusion  . Pneumonia Oc. 2012  . Renal artery stenosis (HCC)    Abdominal ultrasound 03/2010:4.7 x 5.0 cm AAA, critical right renal artery stenosis and greater than 60% left renal artery stenosis  . Subclavian artery stenosis, left (HCC)    left subclavian stenosis by ultrasound   . Visual field cut    right with supranasal quadrantopia to retinal artery occlusion    Patient Active Problem List   Diagnosis Date Noted  . Chronic pain disorder 03/23/2016  . Lumbar back pain with radiculopathy affecting right lower extremity 03/23/2016  . Insomnia, unspecified 03/23/2016  . Cardiomyopathy, ischemic 01/02/2016  . COPD  exacerbation ([place]) 12/31/2015  . Cough 07/14/2015  . Skin lesion 09/14/2014  . Chronic systolic dysfunction of left ventricle 04/26/2014  . Tobacco abuse 04/26/2014  . Carotid artery narrowing 01/15/2014  . Aneurysm, thoracoabdominal ([place]) 01/15/2014  . Preventative health care 12/15/2013  . History of acute renal failure 04/18/2013  . Aneurysm of thoracic aorta ([place]) 02/27/2012  . Ischemic cardiomyopathy 02/27/2012  . Nodule of left lung 02/03/2012  . Chronic LBP 11/22/2011  . Anxiety disorder, unspecified 11/22/2011  . Angiopathy, peripheral ([place]) 11/22/2011  . Occlusion and  stenosis of carotid artery without mention of cerebral infarction 10/18/2011  . Normocytic anemia 09/27/2011  . Coronary artery disease   . Thrombocytopenia ([place]) 08/06/2011  . History of non-ST elevation myocardial infarction (NSTEMI) 08/03/2011  . RENAL ATHEROSCLEROSIS 05/16/2010  . Cerebrovascular disease 02/23/2010  . CLAUDICATION 02/23/2010  . MURMUR 02/23/2010  . ACHILLES TENDINITIS 09/02/2009  . BACK PAIN, LUMBAR 02/04/2009  . UNSPECIFIED RETINAL VASCULAR OCCLUSION 09/20/2007  . Abdominal aortic aneurysm ([place]) 09/20/2007  . MIGRAINE HEADACHE 08/09/2007  . Hyperlipidemia 07/26/2007  . Essential hypertension 07/26/2007  . COPD with chronic bronchitis ([place]) 07/26/2007    Past Surgical History:  Procedure Laterality Date  . LEFT HEART CATHETERIZATION WITH CORONARY ANGIOGRAM N/A 08/11/2011   Procedure: LEFT HEART CATHETERIZATION WITH CORONARY ANGIOGRAM;  Surgeon: Larey Dresser, MD;  [facility];  Service: Cardiovascular;  Laterality: N/A;  . PERCUTANEOUS CORONARY STENT INTERVENTION (PCI-S) N/A 08/11/2011   Procedure: PERCUTANEOUS CORONARY STENT INTERVENTION (PCI-S);  Surgeon: Peter M Martinique, MD;  Location: [facility] CATH LAB;  Service: Cardiovascular;  Laterality: N/A;  . [employer] Medications    Prior to Admission medications   Medication Sig Start Date End Date Taking? Authorizing Provider  albuterol (PROVENTIL HFA;VENTOLIN HFA) 108 (90 Base) MCG/ACT inhaler Inhale 2 puffs into the lungs every 6 (six) hours as needed for wheezing or shortness of breath. 01/13/16   Magdalen Spatz, NP  albuterol (PROVENTIL) (5 MG/ML) 0.5% nebulizer solution TAKE 0.5 ML(S) BY NEBULIZATION EVERY FOUR HOURS AS NEEDED 02/17/16   Javier Glazier, MD  ALPRAZolam Duanne Moron) 0.25 MG tablet TAKE ONE TABLET BY MOUTH THREE TIMES DAILY AS NEEDED FOR ANXIETY 06/29/16   Betty G Martinique, MD  amitriptyline (ELAVIL) 25 MG tablet Take 0.5 tablets (12.5 mg total) by mouth at  bedtime. 04/14/16   Betty G Martinique, MD  aspirin 325 MG tablet Take 325 mg by mouth daily.     Historical Provider, MD  atorvastatin (LIPITOR) 80 MG tablet Take 1 tablet (80 mg total) by mouth daily. PLEASE CONTACT OFFICE FOR ADDITIONAL REFILLS 06/22/16   Lelon Perla, MD  azithromycin (ZITHROMAX) 500 MG tablet Take 1 tablet today, then one half tablet daily X4 days. 07/26/16   Praveen Mannam, MD  b complex vitamins tablet Take 1 tablet by mouth daily.     Historical Provider, MD  bisoprolol (ZEBETA) 5 MG tablet TAKE 1 TABLET EVERY DAY 04/25/16   Betty G Martinique, MD  budesonide-formoterol [facility]) 160-4.5 MCG/ACT inhaler Take 2 puffs first thing in am and then another 2 puffs about 12 hours later. 03/15/15   Elsie Stain, MD  budesonide-formoterol [facility]) 160-4.5 MCG/ACT inhaler Inhale 2 puffs into the lungs 2 (two) times daily. 06/07/16 06/08/17  Javier Glazier, MD  budesonide-formoterol [facility]) 160-4.5 MCG/ACT inhaler Inhale 2 puffs into the lungs 2 (two) times daily. 07/26/16   Marshell Garfinkel, MD  DALIRESP 500 MCG  TABS tablet TAKE ONE TABLET BY MOUTH ONCE DAILY 01/21/16   Magdalen Spatz, NP  Ferrous Sulfate (IRON) 325 (65 FE) MG TABS Take 1 tablet by mouth daily.    Historical Provider, MD  fluticasone (FLONASE) 50 MCG/ACT nasal spray USE TWO SPRAY(S) IN EACH NOSTRIL ONCE DAILY 02/16/16   Javier Glazier, MD  furosemide (LASIX) 20 MG tablet TAKE 1 TABLET EVERY DAY 04/25/16   Betty G Martinique, MD  glucose blood test strip Test once daily E11.9 01/18/16   Eulas Post, MD  hydrALAZINE (APRESOLINE) 10 MG tablet TAKE 1 TABLET THREE TIMES DAILY 02/25/16   Betty G Martinique, MD  ipratropium (ATROVENT) 0.02 % nebulizer solution INHALE  2.5 ML THREE TIMES DAILY Patient taking differently: INHALE  2.5 ML FOUR TIMES DAILY 01/10/16   Javier Glazier, MD  ipratropium (ATROVENT) 0.02 % nebulizer solution INHALE ONE VIAL IN NEBULIZER EVERY 4 HOURS AS NEEDED 07/28/16   Javier Glazier, MD    ipratropium-albuterol (DUONEB) 0.5-2.5 (3) MG/3ML SOLN Take 3 mLs by nebulization every 6 (six) hours. Use 4 times daily x 4 days, then every 6 hours as needed. 01/06/16   Eugenie Filler, MD  isosorbide dinitrate (ISORDIL) 30 MG tablet TAKE 1/2 TABLET EVERY DAY 07/05/16   Betty G Martinique, MD  KRILL OIL 1000 MG CAPS Take 1 capsule by mouth daily.    Historical Provider, MD  Lancets (ACCU-CHEK SOFT TOUCH) lancets Test once daily Dx E11.9 01/18/16   Eulas Post, MD  morphine (MSIR) 15 MG tablet Take 1 tablet (15 mg total) by mouth 3 (three) times daily. 07/20/16   Betty G Martinique, MD  multivitamin [facility]) per tablet Take 1 tablet by mouth daily.     Historical Provider, MD  polyethylene glycol (MIRALAX / GLYCOLAX) packet Take 17 g by mouth daily. Patient taking differently: Take 17 g by mouth daily as needed.  01/06/16   Eugenie Filler, MD  predniSONE (DELTASONE) 10 MG tablet 60mg  X5 days, 50mg  X5 days, 40mg  X5 days, 30mg  X5 days, 20mg  X5 days, 10mg  X5 days. 07/26/16   Praveen Mannam, MD  predniSONE (DELTASONE) 20 MG tablet Take 2 tablets (40 mg total) by mouth daily with breakfast. 06/07/16   Javier Glazier, MD  Respiratory Therapy Supplies (FLUTTER) DEVI Use as directed. 01/28/16   Javier Glazier, MD  roflumilast (DALIRESP) 500 MCG TABS tablet Take 1 tablet (500 mcg total) by mouth daily. 06/07/16 07/26/16  Javier Glazier, MD  traZODone (DESYREL) 50 MG tablet TAKE ONE-HALF TO ONE TABLET BY MOUTH AT BEDTIME AS NEEDED FOR SLEEP 07/12/16   Betty G Martinique, MD    Family History Family History  Problem Relation Age of Onset  . Throat cancer Mother     died young age secondary to throat cancer  . Cancer Mother     Throat  . Heart disease Father   . Hyperlipidemia Father   . Hypertension Father   . Heart attack Father   . Hypertension Sister   . Diabetes Daughter     Social History Social History  Substance Use Topics  . Smoking status: Former Smoker    Packs/day: 1.00     Years: 55.00    Types: Cigarettes    Quit date: 10/15/2015  . Smokeless tobacco: Never Used  . Alcohol use No     Comment: fomer abuse quit 2006     Allergies   Vancomycin; Beta adrenergic blockers; Cephalexin; Moxifloxacin; and Penicillins  Review of Systems Review of Systems  Constitutional: Negative for chills and fever.  HENT: Negative for congestion and facial swelling.   Eyes: Negative for discharge and visual disturbance.  Respiratory: Positive for shortness of breath.   Cardiovascular: Negative for chest pain and palpitations.  Gastrointestinal: Negative for abdominal pain, diarrhea and vomiting.  Musculoskeletal: Positive for back pain. Negative for arthralgias and myalgias.  Skin: Negative for color change and rash.  Neurological: Negative for tremors, syncope and headaches.  Psychiatric/Behavioral: Negative for confusion and dysphoric mood.     Physical Exam Updated Vital Signs BP 130/76   Pulse 96   Temp 98.5 F (36.9 C) (Oral)   Resp 19   SpO2 (!) 88%   Physical Exam  Constitutional: He is oriented to person, place, and time. He appears well-developed and well-nourished.  HENT:  Head: Normocephalic and atraumatic.  Eyes: EOM are normal. Pupils are equal, round, and reactive to light.  Neck: Normal range of motion. Neck supple. No JVD present.  Cardiovascular: Normal rate and regular rhythm.  Exam reveals no gallop and no friction rub.   No murmur heard. Pulmonary/Chest: No respiratory distress. He has wheezes (diffuse with prolonged expiration).  Abdominal: He exhibits no distension and no mass. There is no tenderness. There is no rebound and no guarding.  Musculoskeletal: Normal range of motion. He exhibits edema (ight lower extremity greater than left lower extremity).  Neurological: He is alert and oriented to person, place, and time.  Skin: No rash noted. No pallor.  Psychiatric: He has a normal mood and affect. His behavior is normal.  Nursing note  and vitals reviewed.    ED Treatments / Results  Labs (all labs ordered are listed, but only abnormal results are displayed) Labs Reviewed  CBC WITH DIFFERENTIAL/PLATELET - Abnormal; Notable for the following:       Result Value   WBC 13.5 (*)    Neutro Abs 9.9 (*)    Monocytes Absolute 1.2 (*)    All other components within normal limits  COMPREHENSIVE METABOLIC PANEL - Abnormal; Notable for the following:    Chloride 96 (*)    BUN 23 (*)    All other components within normal limits  BRAIN NATRIURETIC PEPTIDE - Abnormal; Notable for the following:    B Natriuretic Peptide 973.7 (*)    All other components within normal limits  BLOOD GAS, VENOUS - Abnormal; Notable for the following:    pH, Ven 7.446 (*)    pO2, Ven 50.5 (*)    Bicarbonate 30.5 (*)    Acid-Base Excess 6.0 (*)    All other components within normal limits  I-STAT CHEM 8, ED - Abnormal; Notable for the following:    Sodium 134 (*)    Chloride 96 (*)    BUN 23 (*)    Calcium, Ion 1.10 (*)    All other components within normal limits  APTT  PROTIME-INR  I-STAT TROPOININ, ED    EKG  EKG Interpretation  Date/Time:  Sunday July 30 2016 10:02:47 EDT Ventricular Rate:  145 PR Interval:    QRS Duration: 107 QT Interval:  287 QTC Calculation: 446 R Axis:   -42 Text Interpretation:  Atrial fibrillation with rapid V-rate Left anterior fascicular block Anteroseptal infarct, age indeterminate ST depression, probably rate related Otherwise no significant change Confirmed by Tyrone Nine MD, DANIEL [phone]) on 07/30/2016 10:06:55 AM       Radiology Dg Chest 2 View  Result Date: 07/30/2016 CLINICAL DATA:  Shortness of  breath EXAM: CHEST  2 VIEW COMPARISON:  07/26/2016 chest radiograph. FINDINGS: Stable cardiomediastinal silhouette with normal heart size and mildly tortuous atherosclerotic thoracic aorta. No pneumothorax. No pleural effusion. Emphysema. No pulmonary edema. Hyperinflated lungs. No acute consolidative  airspace disease. IMPRESSION: Emphysema and hyperinflated lungs, suggesting COPD. No acute consolidative airspace disease. Aortic atherosclerosis. Electronically Signed   By: Ilona Sorrel M.D.   On: 07/30/2016 09:32   Ct Angio Chest Pe W And/or Wo Contrast  Result Date: 07/30/2016 CLINICAL DATA:  Dyspnea. Bilateral lower back pain. History of abdominal aortic aneurysm. EXAM: CT ANGIOGRAPHY CHEST CT ABDOMEN AND PELVIS WITH CONTRAST TECHNIQUE: Multidetector CT imaging of the chest was performed using the standard protocol during bolus administration of intravenous contrast. Multiplanar CT image reconstructions and MIPs were obtained to evaluate the vascular anatomy. Multidetector CT imaging of the abdomen and pelvis was performed using the standard protocol during bolus administration of intravenous contrast. CONTRAST:  100 mL ISOVUE-370 IOPAMIDOL (ISOVUE-300) INJECTION 76% COMPARISON:  Abdomen and pelvis CT dated 07/02/2013 and chest CT dated 04/12/2016. FINDINGS: CTA CHEST FINDINGS Cardiovascular: Satisfactory opacification of the pulmonary arteries to the segmental level. No evidence of pulmonary embolism. Normal heart size. Minimal pericardial effusion with a maximum thickness of 15 mm. Atheromatous arterial calcifications and plaques, involving the thoracic aorta and coronary arteries. Mediastinum/Nodes: No enlarged mediastinal, hilar, or axillary lymph nodes. Thyroid gland, trachea, and esophagus demonstrate no significant findings. Lungs/Pleura: Extensive bullous changes in both upper lobes. The 6 mm spiculated nodule in the left upper lobe seen on 04/12/2016 measures 5 mm in maximum diameter today on image number 13 of series 7. The previously demonstrated 2.4 x 1.0 cm mass-like density in the posterior aspect of the left upper lobe is smaller today, measuring 2.2 x 0.6 cm today. A previously demonstrated 6 mm nodule in the superior segment of the left lower lobe currently measures 4 mm on image number 28  of series 7. No new nodules are seen. No pleural fluid. There is a small amount of bilateral lower lobe atelectasis or scarring. Musculoskeletal: Thoracic spine degenerative changes. Review of the MIP images confirms the above findings. CT ABDOMEN and PELVIS FINDINGS Hepatobiliary: No focal liver abnormality is seen. No gallstones, gallbladder wall thickening, or biliary dilatation. Pancreas: 2 small calculi in the head of the pancreas. No pancreatic mass seen. Spleen: Multiple calcified splenic granulomata. Adrenals/Urinary Tract: Stable right renal cyst and small left renal cyst. Normal appearing urinary bladder and ureters. No calculi or hydronephrosis. Normal appearing adrenal glands. Stomach/Bowel: Multiple sigmoid colon diverticula. Unremarkable stomach and small bowel. Normal appendix. Vascular/Lymphatic: Again demonstrated is a large infrarenal abdominal aortic aneurysm, beginning at the level of the renal arteries and just below the origin of the superior mesenteric artery. This measures 6.4 cm in maximum diameter on image number 33 this previously measured 5.7 cm in corresponding diameter. Aneurysmal dilatation of both common iliac arteries is again demonstrated. The right common iliac artery measures 2.5 cm in maximum transverse diameter, previously 2.3 cm. The left common iliac artery measures 1.9 cm in maximum transverse diameter, previously 2.0 cm. Atheromatous arterial calcifications and plaques are demonstrated, including neural thrombosed in the abdominal aortic aneurysm. No enlarged lymph nodes. No retroperitoneal hemorrhage. Reproductive: Prostate is unremarkable. Other: Small left paraumbilical hernia containing fat and mesenteric vessels without significant change. Musculoskeletal: Lumbar and lower thoracic spine degenerative changes. Review of the MIP images confirms the above findings. IMPRESSION: 1. No pulmonary emboli or acute abnormality. 2. Interval increase in size of  the infrarenal  abdominal aortic aneurysm, currently measuring 6.4 cm in maximum diameter. Vascular surgery consultation recommended due to increased risk of rupture for AAA >5.5 cm. This recommendation follows ACR consensus guidelines: Jason Paper of the ACR Incidental Findings Committee II on Vascular Findings. J Am Coll Radiol 2013; 10:789-794. 3. Mildly progressed aneurysmal dilatation of the right common iliac artery with a maximum transverse diameter 2.5 cm. 4. Stable aneurysmal dilatation of the left common iliac artery with a maximum transverse diameter 1.9 cm. 5. Coronary artery atherosclerosis and aortic atherosclerosis. 6. Stable and mildly improved left lung nodules, as described above. A follow-up chest CT is recommended in 6-12 months. 7. COPD with extensive centrilobular emphysema. 8. Small left paraumbilical hernia containing fat and mesenteric vessels. 9. Sigmoid diverticulosis. Electronically Signed   By: Claudie Revering M.D.   On: 07/30/2016 11:54   Ct Abdomen Pelvis W Contrast  Result Date: 07/30/2016 CLINICAL DATA:  Dyspnea. Bilateral lower back pain. History of abdominal aortic aneurysm. EXAM: CT ANGIOGRAPHY CHEST CT ABDOMEN AND PELVIS WITH CONTRAST TECHNIQUE: Multidetector CT imaging of the chest was performed using the standard protocol during bolus administration of intravenous contrast. Multiplanar CT image reconstructions and MIPs were obtained to evaluate the vascular anatomy. Multidetector CT imaging of the abdomen and pelvis was performed using the standard protocol during bolus administration of intravenous contrast. CONTRAST:  100 mL ISOVUE-370 IOPAMIDOL (ISOVUE-300) INJECTION 76% COMPARISON:  Abdomen and pelvis CT dated 07/02/2013 and chest CT dated 04/12/2016. FINDINGS: CTA CHEST FINDINGS Cardiovascular: Satisfactory opacification of the pulmonary arteries to the segmental level. No evidence of pulmonary embolism. Normal heart size. Minimal pericardial effusion with a maximum thickness of 15 mm.  Atheromatous arterial calcifications and plaques, involving the thoracic aorta and coronary arteries. Mediastinum/Nodes: No enlarged mediastinal, hilar, or axillary lymph nodes. Thyroid gland, trachea, and esophagus demonstrate no significant findings. Lungs/Pleura: Extensive bullous changes in both upper lobes. The 6 mm spiculated nodule in the left upper lobe seen on 04/12/2016 measures 5 mm in maximum diameter today on image number 13 of series 7. The previously demonstrated 2.4 x 1.0 cm mass-like density in the posterior aspect of the left upper lobe is smaller today, measuring 2.2 x 0.6 cm today. A previously demonstrated 6 mm nodule in the superior segment of the left lower lobe currently measures 4 mm on image number 28 of series 7. No new nodules are seen. No pleural fluid. There is a small amount of bilateral lower lobe atelectasis or scarring. Musculoskeletal: Thoracic spine degenerative changes. Review of the MIP images confirms the above findings. CT ABDOMEN and PELVIS FINDINGS Hepatobiliary: No focal liver abnormality is seen. No gallstones, gallbladder wall thickening, or biliary dilatation. Pancreas: 2 small calculi in the head of the pancreas. No pancreatic mass seen. Spleen: Multiple calcified splenic granulomata. Adrenals/Urinary Tract: Stable right renal cyst and small left renal cyst. Normal appearing urinary bladder and ureters. No calculi or hydronephrosis. Normal appearing adrenal glands. Stomach/Bowel: Multiple sigmoid colon diverticula. Unremarkable stomach and small bowel. Normal appendix. Vascular/Lymphatic: Again demonstrated is a large infrarenal abdominal aortic aneurysm, beginning at the level of the renal arteries and just below the origin of the superior mesenteric artery. This measures 6.4 cm in maximum diameter on image number 33 this previously measured 5.7 cm in corresponding diameter. Aneurysmal dilatation of both common iliac arteries is again demonstrated. The right common  iliac artery measures 2.5 cm in maximum transverse diameter, previously 2.3 cm. The left common iliac artery measures 1.9 cm in maximum transverse  diameter, previously 2.0 cm. Atheromatous arterial calcifications and plaques are demonstrated, including neural thrombosed in the abdominal aortic aneurysm. No enlarged lymph nodes. No retroperitoneal hemorrhage. Reproductive: Prostate is unremarkable. Other: Small left paraumbilical hernia containing fat and mesenteric vessels without significant change. Musculoskeletal: Lumbar and lower thoracic spine degenerative changes. Review of the MIP images confirms the above findings. IMPRESSION: 1. No pulmonary emboli or acute abnormality. 2. Interval increase in size of the infrarenal abdominal aortic aneurysm, currently measuring 6.4 cm in maximum diameter. Vascular surgery consultation recommended due to increased risk of rupture for AAA >5.5 cm. This recommendation follows ACR consensus guidelines: Jason Paper of the ACR Incidental Findings Committee II on Vascular Findings. J Am Coll Radiol 2013; 10:789-794. 3. Mildly progressed aneurysmal dilatation of the right common iliac artery with a maximum transverse diameter 2.5 cm. 4. Stable aneurysmal dilatation of the left common iliac artery with a maximum transverse diameter 1.9 cm. 5. Coronary artery atherosclerosis and aortic atherosclerosis. 6. Stable and mildly improved left lung nodules, as described above. A follow-up chest CT is recommended in 6-12 months. 7. COPD with extensive centrilobular emphysema. 8. Small left paraumbilical hernia containing fat and mesenteric vessels. 9. Sigmoid diverticulosis. Electronically Signed   By: Claudie Revering M.D.   On: 07/30/2016 11:54    Procedures Procedures (including critical care time)  Medications Ordered in ED Medications  albuterol (PROVENTIL,VENTOLIN) solution continuous neb (7.5 mg/hr Nebulization New Bag/Given 07/30/16 1200)  methylPREDNISolone sodium succinate  (SOLU-MEDROL) 125 mg/2 mL injection 80 mg (not administered)  doxycycline (VIBRA-TABS) tablet 100 mg (not administered)  heparin injection 5,000 Units (not administered)  ipratropium-albuterol (DUONEB) 0.5-2.5 (3) MG/3ML nebulizer solution 3 mL (3 mLs Nebulization Given 07/30/16 0911)  predniSONE (DELTASONE) tablet 60 mg (60 mg Oral Given 07/30/16 0907)  morphine 2 MG/ML injection 2 mg (2 mg Intravenous Given 07/30/16 0933)  ondansetron (ZOFRAN) injection 4 mg (4 mg Intravenous Given 07/30/16 0932)  diltiazem (CARDIZEM) injection 20 mg (20 mg Intravenous Given 07/30/16 1017)  iopamidol (ISOVUE-300) 61 % injection 15 mL (15 mLs Oral Contrast Given 07/30/16 1043)  morphine 2 MG/ML injection 4 mg (4 mg Intravenous Given 07/30/16 1045)  ondansetron (ZOFRAN) injection 4 mg (4 mg Intravenous Given 07/30/16 1045)  iopamidol (ISOVUE-370) 76 % injection 100 mL (100 mLs Intravenous Contrast Given 07/30/16 1109)  magnesium sulfate IVPB 2 g 50 mL (0 g Intravenous Stopped 07/30/16 1253)     Initial Impression / Assessment and Plan / ED Course  I have reviewed the triage vital signs and the nursing notes.  Pertinent labs & imaging results that were available during my care of the patient were reviewed by me and considered in my medical decision making (see chart for details).  Clinical Course   65 yo M with sob.  Going on for past three days. Also having some new bilateral flank pain. Patient with a history of a AAA. Patient with likely a COPD exacerbation. Given to a continuous treatment with some improvement. The patient had a few episodes of A. fib with RVR. This resolved with a 20 mg bolus of diltiazem. Patient's AAA noted to be much larger than about a year ago. Discussed with Dr. Donnetta Hutching, vascular. He feels his symptoms are unlikely to be due to loose aorta. He does recommend that patient be admitted to [facility] should something worsen. He felt ok starting on heparin for paroxysmal afib.   CRITICAL  CARE Performed by: Cecilio Asper   Total critical care time: 80 minutes  Critical care time was exclusive of separately billable procedures and treating other patients.  Critical care was necessary to treat or prevent imminent or life-threatening deterioration.  Critical care was time spent personally by me on the following activities: development of treatment plan with patient and/or surrogate as well as nursing, discussions with consultants, evaluation of patient's response to treatment, examination of patient, obtaining history from patient or surrogate, ordering and performing treatments and interventions, ordering and review of laboratory studies, ordering and review of radiographic studies, pulse oximetry and re-evaluation of patient's condition.   The patients results and plan were reviewed and discussed.   Any x-rays performed were independently reviewed by myself.   Differential diagnosis were considered with the presenting HPI.  Medications  albuterol (PROVENTIL,VENTOLIN) solution continuous neb (7.5 mg/hr Nebulization New Bag/Given 07/30/16 1200)  methylPREDNISolone sodium succinate (SOLU-MEDROL) 125 mg/2 mL injection 80 mg (not administered)  doxycycline (VIBRA-TABS) tablet 100 mg (not administered)  heparin injection 5,000 Units (not administered)  ipratropium-albuterol (DUONEB) 0.5-2.5 (3) MG/3ML nebulizer solution 3 mL (3 mLs Nebulization Given 07/30/16 0911)  predniSONE (DELTASONE) tablet 60 mg (60 mg Oral Given 07/30/16 0907)  morphine 2 MG/ML injection 2 mg (2 mg Intravenous Given 07/30/16 0933)  ondansetron (ZOFRAN) injection 4 mg (4 mg Intravenous Given 07/30/16 0932)  diltiazem (CARDIZEM) injection 20 mg (20 mg Intravenous Given 07/30/16 1017)  iopamidol (ISOVUE-300) 61 % injection 15 mL (15 mLs Oral Contrast Given 07/30/16 1043)  morphine 2 MG/ML injection 4 mg (4 mg Intravenous Given 07/30/16 1045)  ondansetron (ZOFRAN) injection 4 mg (4 mg Intravenous  Given 07/30/16 1045)  iopamidol (ISOVUE-370) 76 % injection 100 mL (100 mLs Intravenous Contrast Given 07/30/16 1109)  magnesium sulfate IVPB 2 g 50 mL (0 g Intravenous Stopped 07/30/16 1253)    Vitals:   07/30/16 1201 07/30/16 1221 07/30/16 1330 07/30/16 1400  BP:  152/88 139/80 130/76  Pulse:  80 97 96  Resp:  12 16 19   Temp:      TempSrc:      SpO2: 98% 100% 90% (!) 88%    Final diagnoses:  Abdominal aortic aneurysm (AAA) without rupture (HCC)  COPD exacerbation (HCC)    Admission/ observation were discussed with the admitting physician, patient and/or family and they are comfortable with the plan.    Final Clinical Impressions(s) / ED Diagnoses   Final diagnoses:  Abdominal aortic aneurysm (AAA) without rupture (HCC)  COPD exacerbation ([place])    New Prescriptions New Prescriptions   No medications on file     Deno Etienne, DO 07/30/16 1431

## 2016-07-30 NOTE — ED Notes (Signed)
Pt states his nose is stuffed up and was not breathing in deeply.  Pt encouraged to take deep breathes and O2 sat increased to 92% of 2L [place].

## 2016-07-30 NOTE — Consult Note (Signed)
[facility] [place]  Patient name: [name] MRN: [id number] DOB: 12/22/1950 Sex: male  REASON FOR CONSULT: Abdominal aortic aneurysm  HPI: [name] is a 65 y.o. male, who is sending to the emergency department with severe exacerbation of his beer baseline COPD. Apparently has not been able to lie flat for 3 days with progressive difficulty with breathing. He also reports that he has had bilateral lower flank pain just above his hips which is been present in the past but is worse currently. He has nausea and vomiting and reports that yesterday he was vomiting after each attempt at eating. He denies any abdominal pain currently. He does have a known juxtarenal abdominal aortic aneurysm. This is been followed for several years in our office by Dr. Scot Dock. He had continued progression and was referred to Dr. Sammuel Hines at [facility] for consideration of thoracoabdominal repair. He was felt to be at too high risk for this and was continually followed with serial exams. CT today shows a 6.4 cm infrarenal aneurysm. On comparing similar study from CT on October 2014 the maximal diameter was 5.7 cm. He does have a history of severe coronary disease with coronary interventions in the past as well. Does severe degenerative disc disease in his back by history and also by his current CT scan.  Past Medical History:  Diagnosis Date  . AAA (abdominal aortic aneurysm) (HCC)    5.0 X 4.8;  followed by vascular surgery  . Anemia   . Carotid stenosis    carotids 09/2011 bilateral 60-79% ICA stenosis  . COPD (chronic obstructive pulmonary disease) (HCC)    Golds Stage II- Fev1 73% , FVC 49%, Rv 135%, DLCO 68%-03/2007  . Coronary artery disease 11/12   LHC 08/04/11:normal LM. The proximal LAD just beyond the ostium of this diagonal had a long 90% stenosis with TIMI 2 flow. The LAD was also collateralized by the RCA. No disease in the Lcx. 50% mid  RCA stenosis. The RCA gives collaterals to the LAD. LV EF appeared normal, estimate 55%, no definite wall motion abnormalities.  PCI was attempted of his LAD but could not be crossed - med Tx  . Hepatitis, alcoholic    January 123456  . History of hypokalemia    secondary to alcohol abuse January 2006  . Hyperlipidemia   . Hypertension   . Hypomagnesemia    secondary to alcohol abuse January 2006  . Ischemic cardiomyopathy    echo 09/2011: EF 35-40%, akinesis of the distal LV, moderate LVH, grade 1 diastolic dysfunction, moderate LAE, PASP 34.  Marland Kitchen Left eye trauma    status post 15 years agoto the left eye  . Lumbar back pain   . Migraine headache   . Myocardial infarction 11/12  . PAD (peripheral artery disease) (HCC)    with intermittent claudication, bilateral SFA occlusion  . Pneumonia Oc. 2012  . Renal artery stenosis (HCC)    Abdominal ultrasound 03/2010:4.7 x 5.0 cm AAA, critical right renal artery stenosis and greater than 60% left renal artery stenosis  . Subclavian artery stenosis, left (HCC)    left subclavian stenosis by ultrasound   . Visual field cut    right with supranasal quadrantopia to retinal artery occlusion    Family History  Problem Relation Age of Onset  . Throat cancer Mother     died young age secondary to throat cancer  . Cancer Mother     Throat  . Heart disease Father   . Hyperlipidemia Father   . Hypertension Father   . Heart attack Father   . Hypertension Sister   . Diabetes Daughter     SOCIAL HISTORY: Social History   Social History  . Marital status: Married    Spouse name: N/A  . Number of children: N/A  . Years of education: N/A   Occupational History  . FIELD SERVICE [employer]    Unemployed   Social History Main Topics  . Smoking status: Former Smoker    Packs/day: 1.00    Years: 55.00    Types: Cigarettes    Quit date: 10/15/2015  . Smokeless tobacco: Never Used  . Alcohol use No     Comment: fomer abuse quit  2006  . Drug use: No  . Sexual activity: Not on file   Other Topics Concern  . Not on file   Social History Narrative   Occupation: retired, unemployed since 06/2010   Married with two grown daughters    current smoker - using e cigarettes   Alcohol use-no; former abuse quit 2006    Smoking Status:  current             Allergies  Allergen Reactions  . Vancomycin     thrombocytopenia  . Beta Adrenergic Blockers Other (See Comments)    Can take bisoprolol, cannot take less selective BB due to severe copd   . Cephalexin     REACTION: anaphylactic shock  . Moxifloxacin     REACTION: achilles tendon rupture  . Penicillins     REACTION: rash    Current Facility-Administered Medications  Medication Dose Route Frequency Provider Last Rate Last Dose  . albuterol (PROVENTIL,VENTOLIN) solution continuous neb  7.5 mg/hr Nebulization Continuous Deno Etienne, DO 1.5 mL/hr at 07/30/16 1200 7.5 mg/hr at 07/30/16 1200  . heparin ADULT infusion 100 units/mL (25000 units/267mL sodium chloride 0.45%)  1,050 Units/hr Intravenous Continuous Christine E Shade, RPH      . heparin bolus via infusion 3,800 Units  3,800 Units Intravenous STAT Randa Spike, [facility]       Current Outpatient Prescriptions  Medication Sig Dispense Refill  . albuterol (PROVENTIL HFA;VENTOLIN HFA) 108 (90 Base) MCG/ACT inhaler Inhale 2 puffs into the lungs every 6 (six) hours as needed for wheezing or shortness of breath. 1 Inhaler 6  . albuterol (PROVENTIL) (5 MG/ML) 0.5% nebulizer solution TAKE 0.5 ML(S) BY NEBULIZATION EVERY FOUR HOURS AS NEEDED 150 vial 3  . ALPRAZolam (XANAX) 0.25 MG tablet TAKE ONE TABLET BY MOUTH THREE TIMES DAILY AS NEEDED FOR ANXIETY 90 tablet 2  . amitriptyline (ELAVIL) 25 MG tablet Take 0.5 tablets (12.5 mg total) by mouth at bedtime. 90 tablet 1  . aspirin 325 MG tablet Take 325 mg by mouth daily.     Marland Kitchen atorvastatin (LIPITOR) 80 MG tablet Take 1 tablet (80 mg total) by mouth daily. PLEASE  CONTACT OFFICE FOR ADDITIONAL REFILLS 90 tablet 0  . azithromycin (ZITHROMAX) 500 MG tablet Take 1 tablet today, then one half tablet daily X4 days. 3 tablet 0  . b complex vitamins tablet Take 1 tablet by mouth daily.     . bisoprolol (ZEBETA) 5 MG tablet TAKE 1 TABLET EVERY DAY 90 tablet 1  . budesonide-formoterol (SYMBICORT) 160-4.5 MCG/ACT inhaler  Take 2 puffs first thing in am and then another 2 puffs about 12 hours later. 2 Inhaler 0  . budesonide-formoterol (SYMBICORT) 160-4.5 MCG/ACT inhaler Inhale 2 puffs into the lungs 2 (two) times daily. 2 Inhaler 0  . budesonide-formoterol (SYMBICORT) 160-4.5 MCG/ACT inhaler Inhale 2 puffs into the lungs 2 (two) times daily. 2 Inhaler 0  . DALIRESP 500 MCG TABS tablet TAKE ONE TABLET BY MOUTH ONCE DAILY 30 tablet 5  . Ferrous Sulfate (IRON) 325 (65 FE) MG TABS Take 1 tablet by mouth daily.    . fluticasone (FLONASE) 50 MCG/ACT nasal spray USE TWO SPRAY(S) IN EACH NOSTRIL ONCE DAILY 16 g 1  . furosemide (LASIX) 20 MG tablet TAKE 1 TABLET EVERY DAY 90 tablet 1  . glucose blood test strip Test once daily E11.9 100 each 12  . hydrALAZINE (APRESOLINE) 10 MG tablet TAKE 1 TABLET THREE TIMES DAILY 270 tablet 0  . ipratropium (ATROVENT) 0.02 % nebulizer solution INHALE  2.5 ML THREE TIMES DAILY (Patient taking differently: INHALE  2.5 ML FOUR TIMES DAILY) 25 mL 5  . ipratropium (ATROVENT) 0.02 % nebulizer solution INHALE ONE VIAL IN NEBULIZER EVERY 4 HOURS AS NEEDED 360 mL 1  . ipratropium-albuterol (DUONEB) 0.5-2.5 (3) MG/3ML SOLN Take 3 mLs by nebulization every 6 (six) hours. Use 4 times daily x 4 days, then every 6 hours as needed. 360 mL 3  . isosorbide dinitrate (ISORDIL) 30 MG tablet TAKE 1/2 TABLET EVERY DAY 45 tablet 1  . KRILL OIL 1000 MG CAPS Take 1 capsule by mouth daily.    . Lancets (ACCU-CHEK SOFT TOUCH) lancets Test once daily Dx E11.9 100 each 12  . morphine (MSIR) 15 MG tablet Take 1 tablet (15 mg total) by mouth 3 (three) times daily. 90  tablet 0  . multivitamin (THERAGRAN) per tablet Take 1 tablet by mouth daily.     . polyethylene glycol (MIRALAX / GLYCOLAX) packet Take 17 g by mouth daily. (Patient taking differently: Take 17 g by mouth daily as needed. ) 30 each 0  . predniSONE (DELTASONE) 10 MG tablet 60mg  X5 days, 50mg  X5 days, 40mg  X5 days, 30mg  X5 days, 20mg  X5 days, 10mg  X5 days. 105 tablet 0  . predniSONE (DELTASONE) 20 MG tablet Take 2 tablets (40 mg total) by mouth daily with breakfast. 8 tablet 0  . Respiratory Therapy Supplies (FLUTTER) DEVI Use as directed. 1 each 0  . roflumilast (DALIRESP) 500 MCG TABS tablet Take 1 tablet (500 mcg total) by mouth daily. 7 tablet 0  . traZODone (DESYREL) 50 MG tablet TAKE ONE-HALF TO ONE TABLET BY MOUTH AT BEDTIME AS NEEDED FOR SLEEP 90 tablet 0    REVIEW OF SYSTEMS:  [X]  denotes positive finding, [ ]  denotes negative finding Cardiac  Comments:  Chest pain or chest pressure:    Shortness of breath upon exertion: x   Short of breath when lying flat: x   Irregular heart rhythm: x       Vascular    Pain in calf, thigh, or hip brought on by ambulation:    Pain in feet at night that wakes you up from your sleep:     Blood clot in your veins:    Leg swelling:         Pulmonary    Oxygen at home: x   Productive cough:     Wheezing:         Neurologic    Sudden weakness in arms or legs:  Sudden numbness in arms or legs:     Sudden onset of difficulty speaking or slurred speech:    Temporary loss of vision in one eye:     Problems with dizziness:         Gastrointestinal    Blood in stool:     Vomited blood:         Genitourinary    Burning when urinating:     Blood in urine:        Psychiatric    Major depression:         Hematologic    Bleeding problems:    Problems with blood clotting too easily:        Skin    Rashes or ulcers:        Constitutional    Fever or chills:      PHYSICAL EXAM: Vitals:   07/30/16 1100 07/30/16 1130 07/30/16 1201  07/30/16 1221  BP: 130/87   152/88  Pulse: 92 80  80  Resp: 20 14  12   Temp:      TempSrc:      SpO2: 91% 94% 98% 100%    GENERAL: The patient is a well-nourished male, iIn moderate respiratory distress. Sitting up on the stretcher. Using accessory muscles.. The vital signs are documented above. CARDIOVASCULAR: 2+ radial and 2+ popliteal pulses. I do not palpate pedal pulses. PULMONARY: Increased respiratory effort ABDOMEN: Soft and non-tender . No masses noted MUSCULOSKELETAL: There are no major deformities or cyanosis. NEUROLOGIC: No focal weakness or paresthesias are detected. SKIN: There are no ulcers or rashes noted. PSYCHIATRIC: The patient has a normal affect.  DATA:  CT scan from today was compared to the CT scan from October 2014. 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 He has extreme calcification of his aortoiliac segments. There is no evidence of leak.  MEDICAL ISSUES: Exacerbation of severe chronic COPD. Agree with the emergency room physician's recommendation for admission for treatment of this. CT scan shows no evidence of leak. I discussed this at length with the patient and his wife present. Explained that the impossible to completely rule out the aneurysm is the source of his flank pain although this would be quite unlikely. Clearly is not a candidate for elective and potentially not even for urgent repair. Recommend admission to [facility] rather than [facility] for availability of emergent aortic surgery if required. Discussed this with the patient next felt this would be quite unlikely. Will follow along with you.   Rosetta Posner, MD FACS [facility] Tel [phone] Pager [phone]

## 2016-07-30 NOTE — ED Notes (Signed)
After the breathing tx, pt's HR became tachycardic.  Pt was observed in the 160s-170s.  EKG was shot and given to MD.  Pt appeared to be in afrib with RVR.  Pt was given 20mg  of cardiezem and pt's HR decreased to below 100.

## 2016-07-30 NOTE — Progress Notes (Signed)
ANTICOAGULATION CONSULT NOTE - Initial Consult  Pharmacy Consult for Heparin Indication: atrial fibrillation  Allergies  Allergen Reactions  . Vancomycin     thrombocytopenia  . Beta Adrenergic Blockers Other (See Comments)    Can take bisoprolol, cannot take less selective BB due to severe copd   . Cephalexin     REACTION: anaphylactic shock  . Moxifloxacin     REACTION: achilles tendon rupture  . Penicillins     REACTION: rash    Patient Measurements:   Weight 76.6 kg, IBW 66.1 kg Heparin Dosing Weight: actual weight  Vital Signs: Temp: 98.5 F (36.9 C) (10/29 0840) Temp Source: Oral (10/29 0840) BP: 152/88 (10/29 1221) Pulse Rate: 80 (10/29 1221)  Labs:  Recent Labs  07/30/16 0933 07/30/16 0946 07/30/16 1110  HGB 14.4 15.0  --   HCT 43.3 44.0  --   PLT 194  --   --   CREATININE  --  1.20 1.17    Estimated Creatinine Clearance: 57.3 mL/min (by C-G formula based on SCr of 1.17 mg/dL).   Medical History: Past Medical History:  Diagnosis Date  . AAA (abdominal aortic aneurysm) (HCC)    5.0 X 4.8;  followed by vascular surgery  . Anemia   . Carotid stenosis    carotids 09/2011 bilateral 60-79% ICA stenosis  . COPD (chronic obstructive pulmonary disease) (HCC)    Golds Stage II- Fev1 73% , FVC 49%, Rv 135%, DLCO 68%-03/2007  . Coronary artery disease 11/12   LHC 08/04/11:normal LM. The proximal LAD just beyond the ostium of this diagonal had a long 90% stenosis with TIMI 2 flow. The LAD was also collateralized by the RCA. No disease in the Lcx. 50% mid RCA stenosis. The RCA gives collaterals to the LAD. LV EF appeared normal, estimate 55%, no definite wall motion abnormalities.  PCI was attempted of his LAD but could not be crossed - med Tx  . Hepatitis, alcoholic    January 123456  . History of hypokalemia    secondary to alcohol abuse January 2006  . Hyperlipidemia   . Hypertension   . Hypomagnesemia    secondary to alcohol abuse January 2006  . Ischemic  cardiomyopathy    echo 09/2011: EF 35-40%, akinesis of the distal LV, moderate LVH, grade 1 diastolic dysfunction, moderate LAE, PASP 34.  Marland Kitchen Left eye trauma    status post 15 years agoto the left eye  . Lumbar back pain   . Migraine headache   . Myocardial infarction 11/12  . PAD (peripheral artery disease) (HCC)    with intermittent claudication, bilateral SFA occlusion  . Pneumonia Oc. 2012  . Renal artery stenosis (HCC)    Abdominal ultrasound 03/2010:4.7 x 5.0 cm AAA, critical right renal artery stenosis and greater than 60% left renal artery stenosis  . Subclavian artery stenosis, left (HCC)    left subclavian stenosis by ultrasound   . Visual field cut    right with supranasal quadrantopia to retinal artery occlusion    Medications:  Scheduled:   Infusions:  . albuterol 7.5 mg/hr (07/30/16 1200)    Assessment: 15 yoM admitted on 10/29 with SOB and acute COPD exacerbation.  EKG in ED shows Afib with RVR.  No PTA anticoagulants noted on medication history.  CT negative for PE, but does note increased size of the infrarenal abdominal aortic aneurysm (increased risk of rupture with AAA > 5.5 cm, currently measuring 6.4 cm).  Pharmacy is consulted to dose Heparin.  Today, 07/30/2016:  Baseline labs pending.  SCr 1.17, CrCl ~ 57 ml/min CBC: Hgb 15   Goal of Therapy:  Heparin level 0.3-0.7 units/ml Monitor platelets by anticoagulation protocol: Yes   Plan:   Give heparin 3800 units bolus IV x 1  Start heparin IV infusion at 1050 units/hr  Heparin level 6 hours after starting  Daily heparin level and CBC   Gretta Arab PharmD, BCPS Pager [phone] 07/30/2016 12:52 PM

## 2016-07-30 NOTE — ED Notes (Signed)
Attempted to call report, Agricultural consultant on [address] [place] at [place] not sure if they are getting this pt. Told to call back in 10 minutes.

## 2016-07-30 NOTE — H&P (Signed)
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 potentially not even for urgent repair, condition to admit to [facility] in case there is a need for emergent cortex surgery.  Atrial fibrillation - Patient had a brief syncopal episode of A. fib in ED after receiving albuterol, resolved after received IV December, currently back to normal sinus rhythm, this was discussed with Dr Casimer Lanius, he did not recommend immediate anticoagulation, this issue can be followed as an outpatient with his primary cardiologist especially patient back in normal sinus rhythm, if patient goes back into A. fib, then we'll need need official cardiology consult.  History of CAD - Continue with aspirin, statin and beta blockers, denies any chest pain  Hyperlipidemia - Continue with statin  Tobacco abuse - Counseled at length, will start on nicotine patch  DVT Prophylaxis SCDs  AM Labs Ordered, also please  review Full Orders  Family Communication: Admission, patients condition and plan of care including tests being ordered have been  discussed with the patient and wife  who indicate understanding and agree with the plan and Code Status.  Code Status DNR. Confirmed by patient, wife at bedside  Likely DC to  Home  Condition GUARDED    Consults called: Vascular, D/W Dr Telford Nab from cardiology  Admission status: Inpatient  Time spent in minutes : 65 minutes   ELGERGAWY, DAWOOD M.D on 07/30/2016 at 1:53 PM  Between 7am to 7pm - Pager - [phone]. After 7pm go to [email or web address] - password [facility]  [facility] - Office  [phone]

## 2016-07-31 DIAGNOSIS — J441 Chronic obstructive pulmonary disease with (acute) exacerbation: Principal | ICD-10-CM

## 2016-07-31 DIAGNOSIS — I714 Abdominal aortic aneurysm, without rupture: Secondary | ICD-10-CM

## 2016-07-31 DIAGNOSIS — I10 Essential (primary) hypertension: Secondary | ICD-10-CM

## 2016-07-31 LAB — BASIC METABOLIC PANEL
Anion gap: 12 (ref 5–15)
BUN: 31 mg/dL — AB (ref 6–20)
CHLORIDE: 94 mmol/L — AB (ref 101–111)
CO2: 29 mmol/L (ref 22–32)
CREATININE: 1.47 mg/dL — AB (ref 0.61–1.24)
Calcium: 9.2 mg/dL (ref 8.9–10.3)
GFR calc Af Amer: 55 mL/min — ABNORMAL LOW (ref >60)
GFR, EST NON AFRICAN AMERICAN: 48 mL/min — AB (ref >60)
Glucose, Bld: 209 mg/dL — ABNORMAL HIGH (ref 65–99)
Potassium: 4.2 mmol/L (ref 3.5–5.1)
SODIUM: 135 mmol/L (ref 135–145)

## 2016-07-31 LAB — CBC
HCT: 42.1 % (ref 39.0–52.0)
Hemoglobin: 14.1 g/dL (ref 13.0–17.0)
MCH: 30.3 pg (ref 26.0–34.0)
MCHC: 33.5 g/dL (ref 30.0–36.0)
MCV: 90.3 fL (ref 78.0–100.0)
PLATELETS: 165 10*3/uL (ref 150–400)
RBC: 4.66 MIL/uL (ref 4.22–5.81)
RDW: 14.5 % (ref 11.5–15.5)
WBC: 10.1 10*3/uL (ref 4.0–10.5)

## 2016-07-31 MED ORDER — IPRATROPIUM-ALBUTEROL 0.5-2.5 (3) MG/3ML IN SOLN
3.0000 mL | Freq: Four times a day (QID) | RESPIRATORY_TRACT | Status: DC
  Administered 2016-07-31 – 2016-08-03 (×16): 3 mL via RESPIRATORY_TRACT
  Filled 2016-07-31 (×17): qty 3

## 2016-07-31 NOTE — Progress Notes (Signed)
PROGRESS NOTE    [name]  [id number] DOB: 16-Jan-1951 DOA: 07/30/2016 PCP: [name], MD   Outpatient Specialists:     Brief Narrative:  [name]  is a 65 y.o. male, with a past medical history significant for COPD, tobacco abuse, AAA, HTN, HLD ischemic cardiomyopathy, CAD; who presented with complaints , who presents with complaints of worsening dyspnea over the last 3 days, and cough with productive green sputum as well, has been prescribed prednisone taper and azithromycin by his PCP, but reports dyspnea has been worsening which prompted him to come to ED, denies any chest pain, fever or chills, coffee-ground emesis, reports nausea and vomiting yesterday but no recurrence today, as well reports bilateral flank pain. - in ED CTA chest abdomen pelvis was obtained which was negative for PE, but with evidence of worsening infrarenal AAA to 6.4 cm, sent by vascular surgery, as well patient had one episode of A. fib in ED after receiving albuterol, converted back to normal sinus rhythm with IV Cardizem, and reports improvement of symptoms after receiving steroids and continuous type treatment.   Assessment & Plan:   Active Problems:   Hyperlipidemia   Essential hypertension   Cerebrovascular disease   Abdominal aortic aneurysm (HCC)   Coronary artery disease   COPD exacerbation (HCC)   COPD exacerbation -  history of COPD followed by Dr. Ashok Cordia -  actively smoking -  IV solumedrol -  nebs every 6 hours, when necessary albuterol, continue with Spiriva, continue with the Mucinex, -  doxy  Abdominal aortic aneurysm -  vascular surgery  -  not a candidate for elective and potentially not even for urgent repair  Atrial fibrillation - brief episode of A. fib in ED  -  Admitting doc discussed with Dr Casimer Lanius, he did not recommend immediate anticoagulation, this issue can be followed as an outpatient with his primary cardiologist  - patient back in normal sinus  rhythm, if patient goes back into A. fib, then will need an official cardiology consult.  History of CAD - Continue with aspirin, statin and beta blockers, denies any chest pain  Hyperlipidemia - Continue with statin  Tobacco abuse - Counseled at length, will start on nicotine patch   DVT prophylaxis:  SQ Heparin  Code Status: Full Code   Family Communication:   Disposition Plan:     Consultants:       Subjective: Had episode of breathing difficulty this AM  Objective: Vitals:   07/31/16 0455 07/31/16 0517 07/31/16 0601 07/31/16 0739  BP:      Pulse: 72     Resp: 20     Temp:      TempSrc:      SpO2: 92% 92% 94% 94%  Weight:      Height:        Intake/Output Summary (Last 24 hours) at 07/31/16 1052 Last data filed at 07/31/16 0453  Gross per 24 hour  Intake                0 ml  Output              675 ml  Net             -675 ml   Filed Weights   07/30/16 1600 07/31/16 0453  Weight: 70 kg (154 lb 5.2 oz) 69.9 kg (154 lb)    Examination:  General exam: Appears calm and comfortable  Respiratory system: diminished with expiratory wheezing Cardiovascular system: S1 &  S2 heard, RRR. No JVD, murmurs, rubs, gallops or clicks. No pedal edema. Gastrointestinal system: Abdomen is nondistended, soft and nontender. No organomegaly or masses felt. Normal bowel sounds heard. Central nervous system: Alert and oriented. No focal neurological deficits.     Data Reviewed: I have personally reviewed following labs and imaging studies  CBC:  Recent Labs Lab 07/30/16 0933 07/30/16 0946 07/30/16 1649 07/31/16 0930  WBC 13.5*  --  9.6 10.1  NEUTROABS 9.9*  --   --   --   HGB 14.4 15.0 14.5 14.1  HCT 43.3 44.0 43.7 42.1  MCV 91.4  --  91.2 90.3  PLT 194  --  186 [id number]   Basic Metabolic Panel:  Recent Labs Lab 07/30/16 0946 07/30/16 1110 07/30/16 1649 07/31/16 0930  NA 134* 136  --  135  K 3.6 3.7  --  4.2  CL 96* 96*  --  94*  CO2  --  29   --  29  GLUCOSE 96 93  --  209*  BUN 23* 23*  --  31*  CREATININE 1.20 1.17 1.43* 1.47*  CALCIUM  --  9.8  --  9.2   GFR: Estimated Creatinine Clearance: 45.6 mL/min (by C-G formula based on SCr of 1.47 mg/dL (H)). Liver Function Tests:  Recent Labs Lab 07/30/16 1110  AST 32  ALT 44  ALKPHOS 103  BILITOT 1.2  PROT 7.5  ALBUMIN 4.6   No results for input(s): LIPASE, AMYLASE in the last 168 hours. No results for input(s): AMMONIA in the last 168 hours. Coagulation Profile:  Recent Labs Lab 07/30/16 1326  INR 0.92   Cardiac Enzymes: No results for input(s): CKTOTAL, CKMB, CKMBINDEX, TROPONINI in the last 168 hours. BNP (last 3 results) No results for input(s): PROBNP in the last 8760 hours. HbA1C: No results for input(s): HGBA1C in the last 72 hours. CBG: No results for input(s): GLUCAP in the last 168 hours. Lipid Profile: No results for input(s): CHOL, HDL, LDLCALC, TRIG, CHOLHDL, LDLDIRECT in the last 72 hours. Thyroid Function Tests: No results for input(s): TSH, T4TOTAL, FREET4, T3FREE, THYROIDAB in the last 72 hours. Anemia Panel: No results for input(s): VITAMINB12, FOLATE, FERRITIN, TIBC, IRON, RETICCTPCT in the last 72 hours. Urine analysis:    Component Value Date/Time   COLORURINE YELLOW 04/17/2013 1650   APPEARANCEUR CLEAR 04/17/2013 1650   LABSPEC 1.018 04/17/2013 1650   PHURINE 5.0 04/17/2013 1650   GLUCOSEU NEGATIVE 04/17/2013 1650   HGBUR NEGATIVE 04/17/2013 1650   BILIRUBINUR NEGATIVE 04/17/2013 1650   KETONESUR NEGATIVE 04/17/2013 1650   PROTEINUR NEGATIVE 04/17/2013 1650   UROBILINOGEN 0.2 04/17/2013 1650   NITRITE NEGATIVE 04/17/2013 1650   LEUKOCYTESUR NEGATIVE 04/17/2013 1650     )No results found for this or any previous visit (from the past 240 hour(s)).    Anti-infectives    Start     Dose/Rate Route Frequency Ordered Stop   07/30/16 1330  doxycycline (VIBRA-TABS) tablet 100 mg     100 mg Oral Every 12 hours 07/30/16 1324           Radiology Studies: Dg Chest 2 View  Result Date: 07/30/2016 CLINICAL DATA:  Shortness of breath EXAM: CHEST  2 VIEW COMPARISON:  07/26/2016 chest radiograph. FINDINGS: Stable cardiomediastinal silhouette with normal heart size and mildly tortuous atherosclerotic thoracic aorta. No pneumothorax. No pleural effusion. Emphysema. No pulmonary edema. Hyperinflated lungs. No acute consolidative airspace disease. IMPRESSION: Emphysema and hyperinflated lungs, suggesting COPD. No acute consolidative airspace disease. Aortic  atherosclerosis. Electronically Signed   By: Ilona Sorrel M.D.   On: 07/30/2016 09:32   Ct Angio Chest Pe W And/or Wo Contrast  Result Date: 07/30/2016 CLINICAL DATA:  Dyspnea. Bilateral lower back pain. History of abdominal aortic aneurysm. EXAM: CT ANGIOGRAPHY CHEST CT ABDOMEN AND PELVIS WITH CONTRAST TECHNIQUE: Multidetector CT imaging of the chest was performed using the standard protocol during bolus administration of intravenous contrast. Multiplanar CT image reconstructions and MIPs were obtained to evaluate the vascular anatomy. Multidetector CT imaging of the abdomen and pelvis was performed using the standard protocol during bolus administration of intravenous contrast. CONTRAST:  100 mL ISOVUE-370 IOPAMIDOL (ISOVUE-300) INJECTION 76% COMPARISON:  Abdomen and pelvis CT dated 07/02/2013 and chest CT dated 04/12/2016. FINDINGS: CTA CHEST FINDINGS Cardiovascular: Satisfactory opacification of the pulmonary arteries to the segmental level. No evidence of pulmonary embolism. Normal heart size. Minimal pericardial effusion with a maximum thickness of 15 mm. Atheromatous arterial calcifications and plaques, involving the thoracic aorta and coronary arteries. Mediastinum/Nodes: No enlarged mediastinal, hilar, or axillary lymph nodes. Thyroid gland, trachea, and esophagus demonstrate no significant findings. Lungs/Pleura: Extensive bullous changes in both upper lobes. The 6 mm  spiculated nodule in the left upper lobe seen on 04/12/2016 measures 5 mm in maximum diameter today on image number 13 of series 7. The previously demonstrated 2.4 x 1.0 cm mass-like density in the posterior aspect of the left upper lobe is smaller today, measuring 2.2 x 0.6 cm today. A previously demonstrated 6 mm nodule in the superior segment of the left lower lobe currently measures 4 mm on image number 28 of series 7. No new nodules are seen. No pleural fluid. There is a small amount of bilateral lower lobe atelectasis or scarring. Musculoskeletal: Thoracic spine degenerative changes. Review of the MIP images confirms the above findings. CT ABDOMEN and PELVIS FINDINGS Hepatobiliary: No focal liver abnormality is seen. No gallstones, gallbladder wall thickening, or biliary dilatation. Pancreas: 2 small calculi in the head of the pancreas. No pancreatic mass seen. Spleen: Multiple calcified splenic granulomata. Adrenals/Urinary Tract: Stable right renal cyst and small left renal cyst. Normal appearing urinary bladder and ureters. No calculi or hydronephrosis. Normal appearing adrenal glands. Stomach/Bowel: Multiple sigmoid colon diverticula. Unremarkable stomach and small bowel. Normal appendix. Vascular/Lymphatic: Again demonstrated is a large infrarenal abdominal aortic aneurysm, beginning at the level of the renal arteries and just below the origin of the superior mesenteric artery. This measures 6.4 cm in maximum diameter on image number 33 this previously measured 5.7 cm in corresponding diameter. Aneurysmal dilatation of both common iliac arteries is again demonstrated. The right common iliac artery measures 2.5 cm in maximum transverse diameter, previously 2.3 cm. The left common iliac artery measures 1.9 cm in maximum transverse diameter, previously 2.0 cm. Atheromatous arterial calcifications and plaques are demonstrated, including neural thrombosed in the abdominal aortic aneurysm. No enlarged lymph  nodes. No retroperitoneal hemorrhage. Reproductive: Prostate is unremarkable. Other: Small left paraumbilical hernia containing fat and mesenteric vessels without significant change. Musculoskeletal: Lumbar and lower thoracic spine degenerative changes. Review of the MIP images confirms the above findings. IMPRESSION: 1. No pulmonary emboli or acute abnormality. 2. Interval increase in size of the infrarenal abdominal aortic aneurysm, currently measuring 6.4 cm in maximum diameter. Vascular surgery consultation recommended due to increased risk of rupture for AAA >5.5 cm. This recommendation follows ACR consensus guidelines: White Paper of the ACR Incidental Findings Committee II on Vascular Findings. J Am Coll Radiol 2013; 10:789-794. 3. Mildly progressed aneurysmal  dilatation of the right common iliac artery with a maximum transverse diameter 2.5 cm. 4. Stable aneurysmal dilatation of the left common iliac artery with a maximum transverse diameter 1.9 cm. 5. Coronary artery atherosclerosis and aortic atherosclerosis. 6. Stable and mildly improved left lung nodules, as described above. A follow-up chest CT is recommended in 6-12 months. 7. COPD with extensive centrilobular emphysema. 8. Small left paraumbilical hernia containing fat and mesenteric vessels. 9. Sigmoid diverticulosis. Electronically Signed   By: Claudie Revering M.D.   On: 07/30/2016 11:54   Ct Abdomen Pelvis W Contrast  Result Date: 07/30/2016 CLINICAL DATA:  Dyspnea. Bilateral lower back pain. History of abdominal aortic aneurysm. EXAM: CT ANGIOGRAPHY CHEST CT ABDOMEN AND PELVIS WITH CONTRAST TECHNIQUE: Multidetector CT imaging of the chest was performed using the standard protocol during bolus administration of intravenous contrast. Multiplanar CT image reconstructions and MIPs were obtained to evaluate the vascular anatomy. Multidetector CT imaging of the abdomen and pelvis was performed using the standard protocol during bolus administration of  intravenous contrast. CONTRAST:  100 mL ISOVUE-370 IOPAMIDOL (ISOVUE-300) INJECTION 76% COMPARISON:  Abdomen and pelvis CT dated 07/02/2013 and chest CT dated 04/12/2016. FINDINGS: CTA CHEST FINDINGS Cardiovascular: Satisfactory opacification of the pulmonary arteries to the segmental level. No evidence of pulmonary embolism. Normal heart size. Minimal pericardial effusion with a maximum thickness of 15 mm. Atheromatous arterial calcifications and plaques, involving the thoracic aorta and coronary arteries. Mediastinum/Nodes: No enlarged mediastinal, hilar, or axillary lymph nodes. Thyroid gland, trachea, and esophagus demonstrate no significant findings. Lungs/Pleura: Extensive bullous changes in both upper lobes. The 6 mm spiculated nodule in the left upper lobe seen on 04/12/2016 measures 5 mm in maximum diameter today on image number 13 of series 7. The previously demonstrated 2.4 x 1.0 cm mass-like density in the posterior aspect of the left upper lobe is smaller today, measuring 2.2 x 0.6 cm today. A previously demonstrated 6 mm nodule in the superior segment of the left lower lobe currently measures 4 mm on image number 28 of series 7. No new nodules are seen. No pleural fluid. There is a small amount of bilateral lower lobe atelectasis or scarring. Musculoskeletal: Thoracic spine degenerative changes. Review of the MIP images confirms the above findings. CT ABDOMEN and PELVIS FINDINGS Hepatobiliary: No focal liver abnormality is seen. No gallstones, gallbladder wall thickening, or biliary dilatation. Pancreas: 2 small calculi in the head of the pancreas. No pancreatic mass seen. Spleen: Multiple calcified splenic granulomata. Adrenals/Urinary Tract: Stable right renal cyst and small left renal cyst. Normal appearing urinary bladder and ureters. No calculi or hydronephrosis. Normal appearing adrenal glands. Stomach/Bowel: Multiple sigmoid colon diverticula. Unremarkable stomach and small bowel. Normal  appendix. Vascular/Lymphatic: Again demonstrated is a large infrarenal abdominal aortic aneurysm, beginning at the level of the renal arteries and just below the origin of the superior mesenteric artery. This measures 6.4 cm in maximum diameter on image number 33 this previously measured 5.7 cm in corresponding diameter. Aneurysmal dilatation of both common iliac arteries is again demonstrated. The right common iliac artery measures 2.5 cm in maximum transverse diameter, previously 2.3 cm. The left common iliac artery measures 1.9 cm in maximum transverse diameter, previously 2.0 cm. Atheromatous arterial calcifications and plaques are demonstrated, including neural thrombosed in the abdominal aortic aneurysm. No enlarged lymph nodes. No retroperitoneal hemorrhage. Reproductive: Prostate is unremarkable. Other: Small left paraumbilical hernia containing fat and mesenteric vessels without significant change. Musculoskeletal: Lumbar and lower thoracic spine degenerative changes. Review of the MIP  images confirms the above findings. IMPRESSION: 1. No pulmonary emboli or acute abnormality. 2. Interval increase in size of the infrarenal abdominal aortic aneurysm, currently measuring 6.4 cm in maximum diameter. Vascular surgery consultation recommended due to increased risk of rupture for AAA >5.5 cm. This recommendation follows ACR consensus guidelines: White Paper of the ACR Incidental Findings Committee II on Vascular Findings. J Am Coll Radiol 2013; 10:789-794. 3. Mildly progressed aneurysmal dilatation of the right common iliac artery with a maximum transverse diameter 2.5 cm. 4. Stable aneurysmal dilatation of the left common iliac artery with a maximum transverse diameter 1.9 cm. 5. Coronary artery atherosclerosis and aortic atherosclerosis. 6. Stable and mildly improved left lung nodules, as described above. A follow-up chest CT is recommended in 6-12 months. 7. COPD with extensive centrilobular emphysema. 8.  Small left paraumbilical hernia containing fat and mesenteric vessels. 9. Sigmoid diverticulosis. Electronically Signed   By: Claudie Revering M.D.   On: 07/30/2016 11:54        Scheduled Meds: . atorvastatin  80 mg Oral Daily  . bisoprolol  5 mg Oral Daily  . doxycycline  100 mg Oral Q12H  . heparin subcutaneous  5,000 Units Subcutaneous Q8H  . hydrALAZINE  10 mg Oral TID  . ipratropium-albuterol  3 mL Nebulization QID  . isosorbide dinitrate  15 mg Oral Daily  . methylPREDNISolone (SOLU-MEDROL) injection  80 mg Intravenous Q8H  . mometasone-formoterol  2 puff Inhalation BID  . morphine  15 mg Oral TID  . roflumilast  500 mcg Oral Daily  . sodium chloride flush  3 mL Intravenous Q12H   Continuous Infusions: . albuterol 7.5 mg/hr (07/30/16 1200)     LOS: 1 day    Time spent: 25 min    [place], DO [facility] Pager [phone]  If 7PM-7AM, please contact night-coverage [email or web address] Password [email or web address] 07/31/2016, 10:52 AM

## 2016-07-31 NOTE — Consult Note (Signed)
   [facility] CM Inpatient Consult   07/31/2016  AMONTE NUTTING [birth year] [id number]     Spoke with Mr. Damm at bedside to discuss re-enrolling with [facility] Care Management program. He is agreeable to [place] Management follow up. Active consent on file. Mr. Jeffress indicates he is still having trouble affording his medications, especially his inhalers. He endorses that he is able to obtain his nebulizer medications however because they are cheaper. However, he states his inhalers are expensive and he is now in the doughnut hole. States the only inhaler he has is his Symbicort. Mr. Bussie states his inhalers have gone from 47.00 dollars to 120.00 to 133.00 dollars. Requested that Probation officer make referral for [facility] Pharmacist assistance.  Will request for [facility] RNCM follow up for COPD disease and symptom management.   Mr. Cleere appreciative of visit. Contact information provided. Made inpatient RNCM aware of the above.   Marthenia Rolling, MSN-Ed, RN,BSN [facility] Liaison [phone]

## 2016-07-31 NOTE — Progress Notes (Addendum)
The client had some shortness of breath this morning with accessory muscle use, pursed lips, labored, and expiratory wheezing throughout all lung fields. This woke him up from a sleep and RRT came and reordered scheduled nebs and gave a prn treatment. The clients nasal cannula was increased up to 6L to get to 92%. I will continue to monitor the client closely and wean (see flowsheets) his oxygen liters down.

## 2016-07-31 NOTE — Progress Notes (Addendum)
  [facility] Progress Note  Subjective  Breathing better. Denies abdominal pain. Still having pain to bilateral lower flank area above the hips bilaterally. Says this is unchanged. Pain in middle of back in chronic.   Objective Vitals:   07/31/16 0453 07/31/16 0455  BP: 128/76   Pulse: 81 72  Resp:  20  Temp: 97.8 F (36.6 C)     Intake/Output Summary (Last 24 hours) at 07/31/16 1145 Last data filed at 07/31/16 0453  Gross per 24 hour  Intake                0 ml  Output              675 ml  Net             -675 ml   Sitting on side of bed in NAD Abdomen soft and non tender.   Assessment/Planning: 65 y.o. male with AAA without rupture.  Bilateral lower flank/superior hip pain unlikely to be from aneurysm. Pain is unchanged from yesterday. No abdominal pain. Will continue to follow.   Alvia Grove 07/31/2016 11:45 AM --  Laboratory CBC    Component Value Date/Time   WBC 10.1 07/31/2016 0930   HGB 14.1 07/31/2016 0930   HCT 42.1 07/31/2016 0930   PLT 165 07/31/2016 0930    BMET    Component Value Date/Time   NA 135 07/31/2016 0930   K 4.2 07/31/2016 0930   CL 94 (L) 07/31/2016 0930   CO2 29 07/31/2016 0930   GLUCOSE 209 (H) 07/31/2016 0930   BUN 31 (H) 07/31/2016 0930   CREATININE 1.47 (H) 07/31/2016 0930   CREATININE 1.21 08/25/2015 1508   CALCIUM 9.2 07/31/2016 0930   GFRNONAA 48 (L) 07/31/2016 0930   GFRNONAA 72 08/21/2014 1228   GFRAA 55 (L) 07/31/2016 0930   GFRAA 84 08/21/2014 1228    COAG Lab Results  Component Value Date   INR 0.92 07/30/2016   INR 1.04 08/08/2011   No results found for: PTT  Antibiotics Anti-infectives    Start     Dose/Rate Route Frequency Ordered Stop   07/30/16 1330  doxycycline (VIBRA-TABS) tablet 100 mg     100 mg Oral Every 12 hours 07/30/16 1324         Virgina Jock, PA-C [facility] Office: [phone] Pager: [phone] 07/31/2016 11:45 AM  I have  interviewed the patient and examined the patient. I agree with the findings by the PA.  I last saw this patient in February 2015. I was following him with a pararenal abdominal aortic aneurysm. The aneurysm was 5.7 cm in maximum diameter. He was felt to be very high-risk for surgery given his history of ischemic cardiomyopathy, coronary artery disease, and significant COPD. I set up an evaluation by Dr. Sammuel Hines in [place]. He was being followed there with his aneurysm but was told that he was really too high risk for a fenestrated graft or open surgery. He is admitted with a COPD exacerbation. Over the last 2 years his aneurysm has increased from 5.7-6.4 cm. However his very high risk for surgery. The patient was being followed by Dr. Sammuel Hines in [facility] and once he is discharged I will encourage him to continue that follow up. Otherwise I would be happy to follow him here.  Gae Gallop, MD [phone]

## 2016-07-31 NOTE — Progress Notes (Signed)
Spoke with pt about code status, pt stated "If my heart is not beating just let me go", when asked if he wanted to be a DNR pt was unsure and didn't want to make the decision right away, will give printed material on code status, will notify MD of pts questions.  Edward Qualia RN

## 2016-08-01 ENCOUNTER — Other Ambulatory Visit: Payer: Self-pay | Admitting: *Deleted

## 2016-08-01 DIAGNOSIS — I714 Abdominal aortic aneurysm, without rupture, unspecified: Secondary | ICD-10-CM

## 2016-08-01 LAB — BASIC METABOLIC PANEL
Anion gap: 10 (ref 5–15)
BUN: 41 mg/dL — ABNORMAL HIGH (ref 6–20)
CO2: 28 mmol/L (ref 22–32)
Calcium: 8.8 mg/dL — ABNORMAL LOW (ref 8.9–10.3)
Chloride: 97 mmol/L — ABNORMAL LOW (ref 101–111)
Creatinine, Ser: 1.33 mg/dL — ABNORMAL HIGH (ref 0.61–1.24)
GFR calc Af Amer: >60 mL/min (ref >60)
GFR calc non Af Amer: 54 mL/min — ABNORMAL LOW (ref >60)
Glucose, Bld: 160 mg/dL — ABNORMAL HIGH (ref 65–99)
Potassium: 4.4 mmol/L (ref 3.5–5.1)
Sodium: 135 mmol/L (ref 135–145)

## 2016-08-01 LAB — MAGNESIUM: Magnesium: 2.5 mg/dL — ABNORMAL HIGH (ref 1.7–2.4)

## 2016-08-01 NOTE — Progress Notes (Signed)
PROGRESS NOTE    [name]  [id number] DOB: 1951/04/06 DOA: 07/30/2016 PCP: [name], MD   Outpatient Specialists:     Brief Narrative:  [name]  is a 65 y.o. male, with a past medical history significant for COPD, tobacco abuse, AAA, HTN, HLD ischemic cardiomyopathy, CAD; who presented with complaints , who presents with complaints of worsening dyspnea over the last 3 days, and cough with productive green sputum as well, has been prescribed prednisone taper and azithromycin by his PCP, but reports dyspnea has been worsening which prompted him to come to ED, denies any chest pain, fever or chills, coffee-ground emesis, reports nausea and vomiting yesterday but no recurrence today, as well reports bilateral flank pain. - in ED CTA chest abdomen pelvis was obtained which was negative for PE, but with evidence of worsening infrarenal AAA to 6.4 cm, sent by vascular surgery, as well patient had one episode of A. fib in ED after receiving albuterol, converted back to normal sinus rhythm with IV Cardizem, and reports improvement of symptoms after receiving steroids and continuous type treatment.   Assessment & Plan:   Active Problems:   Hyperlipidemia   Essential hypertension   Cerebrovascular disease   Abdominal aortic aneurysm (HCC)   Coronary artery disease   COPD exacerbation (HCC)   COPD exacerbation -  history of COPD followed by Dr. Ashok Cordia -  actively smoking -  IV solumedrol- wean as tolerated -  nebs every 6 hours, when necessary albuterol, continue with Spiriva, continue with the Mucinex, -  doxy  Abdominal aortic aneurysm -  vascular surgery  -  not a candidate for elective and potentially not even for urgent repair -outpatient follow up  Atrial fibrillation - brief episode of A. fib in ED  -  Admitting doc discussed with Dr Casimer Lanius, he did not recommend immediate anticoagulation, this issue can be followed as an outpatient with his primary  cardiologist  - patient back in normal sinus rhythm, if patient goes back into A. fib, then will need an official cardiology consult.  History of CAD - Continue with aspirin, statin and beta blockers, denies any chest pain  Hyperlipidemia - Continue with statin  Tobacco abuse - Counseled at length, will start on nicotine patch  Lung nodules: CT chest repeat 6-12 months  DVT prophylaxis:  SQ Heparin  Code Status: Full Code   Family Communication:   Disposition Plan:     Consultants:       Subjective: Breathing slightly improved  Objective: Vitals:   07/31/16 2015 07/31/16 2043 08/01/16 0429 08/01/16 0823  BP: 117/77  118/74   Pulse: 73  66 67  Resp:    (!) 22  Temp: 97.7 F (36.5 C)  98.1 F (36.7 C)   TempSrc: Oral  Oral   SpO2: 92% 95% 90% 96%  Weight:   70.1 kg (154 lb 9.6 oz)   Height:        Intake/Output Summary (Last 24 hours) at 08/01/16 1056 Last data filed at 07/31/16 1600  Gross per 24 hour  Intake                0 ml  Output              400 ml  Net             -400 ml   Filed Weights   07/30/16 1600 07/31/16 0453 08/01/16 0429  Weight: 70 kg (154 lb 5.2 oz) 69.9 kg (154  lb) 70.1 kg (154 lb 9.6 oz)    Examination:  General exam: Appears calm and comfortable  Respiratory system: rhales with wheezing Cardiovascular system: S1 & S2 heard, RRR. No JVD, murmurs, rubs, gallops or clicks. No pedal edema. Gastrointestinal system: Abdomen is nondistended, soft and nontender. No organomegaly or masses felt. Normal bowel sounds heard. Central nervous system: Alert and oriented. No focal neurological deficits.     Data Reviewed: I have personally reviewed following labs and imaging studies  CBC:  Recent Labs Lab 07/30/16 0933 07/30/16 0946 07/30/16 1649 07/31/16 0930  WBC 13.5*  --  9.6 10.1  NEUTROABS 9.9*  --   --   --   HGB 14.4 15.0 14.5 14.1  HCT 43.3 44.0 43.7 42.1  MCV 91.4  --  91.2 90.3  PLT 194  --  186 [id number]    Basic Metabolic Panel:  Recent Labs Lab 07/30/16 0946 07/30/16 1110 07/30/16 1649 07/31/16 0930 08/01/16 0633  NA 134* 136  --  135 135  K 3.6 3.7  --  4.2 4.4  CL 96* 96*  --  94* 97*  CO2  --  29  --  29 28  GLUCOSE 96 93  --  209* 160*  BUN 23* 23*  --  31* 41*  CREATININE 1.20 1.17 1.43* 1.47* 1.33*  CALCIUM  --  9.8  --  9.2 8.8*  MG  --   --   --   --  2.5*   GFR: Estimated Creatinine Clearance: 50.4 mL/min (by C-G formula based on SCr of 1.33 mg/dL (H)). Liver Function Tests:  Recent Labs Lab 07/30/16 1110  AST 32  ALT 44  ALKPHOS 103  BILITOT 1.2  PROT 7.5  ALBUMIN 4.6   No results for input(s): LIPASE, AMYLASE in the last 168 hours. No results for input(s): AMMONIA in the last 168 hours. Coagulation Profile:  Recent Labs Lab 07/30/16 1326  INR 0.92   Cardiac Enzymes: No results for input(s): CKTOTAL, CKMB, CKMBINDEX, TROPONINI in the last 168 hours. BNP (last 3 results) No results for input(s): PROBNP in the last 8760 hours. HbA1C: No results for input(s): HGBA1C in the last 72 hours. CBG: No results for input(s): GLUCAP in the last 168 hours. Lipid Profile: No results for input(s): CHOL, HDL, LDLCALC, TRIG, CHOLHDL, LDLDIRECT in the last 72 hours. Thyroid Function Tests: No results for input(s): TSH, T4TOTAL, FREET4, T3FREE, THYROIDAB in the last 72 hours. Anemia Panel: No results for input(s): VITAMINB12, FOLATE, FERRITIN, TIBC, IRON, RETICCTPCT in the last 72 hours. Urine analysis:    Component Value Date/Time   COLORURINE YELLOW 04/17/2013 1650   APPEARANCEUR CLEAR 04/17/2013 1650   LABSPEC 1.018 04/17/2013 1650   PHURINE 5.0 04/17/2013 1650   GLUCOSEU NEGATIVE 04/17/2013 1650   HGBUR NEGATIVE 04/17/2013 1650   BILIRUBINUR NEGATIVE 04/17/2013 1650   KETONESUR NEGATIVE 04/17/2013 1650   PROTEINUR NEGATIVE 04/17/2013 1650   UROBILINOGEN 0.2 04/17/2013 1650   NITRITE NEGATIVE 04/17/2013 1650   LEUKOCYTESUR NEGATIVE 04/17/2013 1650      )No results found for this or any previous visit (from the past 240 hour(s)).    Anti-infectives    Start     Dose/Rate Route Frequency Ordered Stop   07/30/16 1330  doxycycline (VIBRA-TABS) tablet 100 mg     100 mg Oral Every 12 hours 07/30/16 1324         Radiology Studies: Ct Angio Chest Pe W And/or Wo Contrast  Result Date: 07/30/2016 CLINICAL DATA:  Dyspnea. Bilateral lower back pain. History of abdominal aortic aneurysm. EXAM: CT ANGIOGRAPHY CHEST CT ABDOMEN AND PELVIS WITH CONTRAST TECHNIQUE: Multidetector CT imaging of the chest was performed using the standard protocol during bolus administration of intravenous contrast. Multiplanar CT image reconstructions and MIPs were obtained to evaluate the vascular anatomy. Multidetector CT imaging of the abdomen and pelvis was performed using the standard protocol during bolus administration of intravenous contrast. CONTRAST:  100 mL ISOVUE-370 IOPAMIDOL (ISOVUE-300) INJECTION 76% COMPARISON:  Abdomen and pelvis CT dated 07/02/2013 and chest CT dated 04/12/2016. FINDINGS: CTA CHEST FINDINGS Cardiovascular: Satisfactory opacification of the pulmonary arteries to the segmental level. No evidence of pulmonary embolism. Normal heart size. Minimal pericardial effusion with a maximum thickness of 15 mm. Atheromatous arterial calcifications and plaques, involving the thoracic aorta and coronary arteries. Mediastinum/Nodes: No enlarged mediastinal, hilar, or axillary lymph nodes. Thyroid gland, trachea, and esophagus demonstrate no significant findings. Lungs/Pleura: Extensive bullous changes in both upper lobes. The 6 mm spiculated nodule in the left upper lobe seen on 04/12/2016 measures 5 mm in maximum diameter today on image number 13 of series 7. The previously demonstrated 2.4 x 1.0 cm mass-like density in the posterior aspect of the left upper lobe is smaller today, measuring 2.2 x 0.6 cm today. A previously demonstrated 6 mm nodule in the  superior segment of the left lower lobe currently measures 4 mm on image number 28 of series 7. No new nodules are seen. No pleural fluid. There is a small amount of bilateral lower lobe atelectasis or scarring. Musculoskeletal: Thoracic spine degenerative changes. Review of the MIP images confirms the above findings. CT ABDOMEN and PELVIS FINDINGS Hepatobiliary: No focal liver abnormality is seen. No gallstones, gallbladder wall thickening, or biliary dilatation. Pancreas: 2 small calculi in the head of the pancreas. No pancreatic mass seen. Spleen: Multiple calcified splenic granulomata. Adrenals/Urinary Tract: Stable right renal cyst and small left renal cyst. Normal appearing urinary bladder and ureters. No calculi or hydronephrosis. Normal appearing adrenal glands. Stomach/Bowel: Multiple sigmoid colon diverticula. Unremarkable stomach and small bowel. Normal appendix. Vascular/Lymphatic: Again demonstrated is a large infrarenal abdominal aortic aneurysm, beginning at the level of the renal arteries and just below the origin of the superior mesenteric artery. This measures 6.4 cm in maximum diameter on image number 33 this previously measured 5.7 cm in corresponding diameter. Aneurysmal dilatation of both common iliac arteries is again demonstrated. The right common iliac artery measures 2.5 cm in maximum transverse diameter, previously 2.3 cm. The left common iliac artery measures 1.9 cm in maximum transverse diameter, previously 2.0 cm. Atheromatous arterial calcifications and plaques are demonstrated, including neural thrombosed in the abdominal aortic aneurysm. No enlarged lymph nodes. No retroperitoneal hemorrhage. Reproductive: Prostate is unremarkable. Other: Small left paraumbilical hernia containing fat and mesenteric vessels without significant change. Musculoskeletal: Lumbar and lower thoracic spine degenerative changes. Review of the MIP images confirms the above findings. IMPRESSION: 1. No  pulmonary emboli or acute abnormality. 2. Interval increase in size of the infrarenal abdominal aortic aneurysm, currently measuring 6.4 cm in maximum diameter. Vascular surgery consultation recommended due to increased risk of rupture for AAA >5.5 cm. This recommendation follows ACR consensus guidelines: White Paper of the ACR Incidental Findings Committee II on Vascular Findings. J Am Coll Radiol 2013; 10:789-794. 3. Mildly progressed aneurysmal dilatation of the right common iliac artery with a maximum transverse diameter 2.5 cm. 4. Stable aneurysmal dilatation of the left common iliac artery with a maximum transverse diameter 1.9 cm. 5.  Coronary artery atherosclerosis and aortic atherosclerosis. 6. Stable and mildly improved left lung nodules, as described above. A follow-up chest CT is recommended in 6-12 months. 7. COPD with extensive centrilobular emphysema. 8. Small left paraumbilical hernia containing fat and mesenteric vessels. 9. Sigmoid diverticulosis. Electronically Signed   By: Claudie Revering M.D.   On: 07/30/2016 11:54   Ct Abdomen Pelvis W Contrast  Result Date: 07/30/2016 CLINICAL DATA:  Dyspnea. Bilateral lower back pain. History of abdominal aortic aneurysm. EXAM: CT ANGIOGRAPHY CHEST CT ABDOMEN AND PELVIS WITH CONTRAST TECHNIQUE: Multidetector CT imaging of the chest was performed using the standard protocol during bolus administration of intravenous contrast. Multiplanar CT image reconstructions and MIPs were obtained to evaluate the vascular anatomy. Multidetector CT imaging of the abdomen and pelvis was performed using the standard protocol during bolus administration of intravenous contrast. CONTRAST:  100 mL ISOVUE-370 IOPAMIDOL (ISOVUE-300) INJECTION 76% COMPARISON:  Abdomen and pelvis CT dated 07/02/2013 and chest CT dated 04/12/2016. FINDINGS: CTA CHEST FINDINGS Cardiovascular: Satisfactory opacification of the pulmonary arteries to the segmental level. No evidence of pulmonary  embolism. Normal heart size. Minimal pericardial effusion with a maximum thickness of 15 mm. Atheromatous arterial calcifications and plaques, involving the thoracic aorta and coronary arteries. Mediastinum/Nodes: No enlarged mediastinal, hilar, or axillary lymph nodes. Thyroid gland, trachea, and esophagus demonstrate no significant findings. Lungs/Pleura: Extensive bullous changes in both upper lobes. The 6 mm spiculated nodule in the left upper lobe seen on 04/12/2016 measures 5 mm in maximum diameter today on image number 13 of series 7. The previously demonstrated 2.4 x 1.0 cm mass-like density in the posterior aspect of the left upper lobe is smaller today, measuring 2.2 x 0.6 cm today. A previously demonstrated 6 mm nodule in the superior segment of the left lower lobe currently measures 4 mm on image number 28 of series 7. No new nodules are seen. No pleural fluid. There is a small amount of bilateral lower lobe atelectasis or scarring. Musculoskeletal: Thoracic spine degenerative changes. Review of the MIP images confirms the above findings. CT ABDOMEN and PELVIS FINDINGS Hepatobiliary: No focal liver abnormality is seen. No gallstones, gallbladder wall thickening, or biliary dilatation. Pancreas: 2 small calculi in the head of the pancreas. No pancreatic mass seen. Spleen: Multiple calcified splenic granulomata. Adrenals/Urinary Tract: Stable right renal cyst and small left renal cyst. Normal appearing urinary bladder and ureters. No calculi or hydronephrosis. Normal appearing adrenal glands. Stomach/Bowel: Multiple sigmoid colon diverticula. Unremarkable stomach and small bowel. Normal appendix. Vascular/Lymphatic: Again demonstrated is a large infrarenal abdominal aortic aneurysm, beginning at the level of the renal arteries and just below the origin of the superior mesenteric artery. This measures 6.4 cm in maximum diameter on image number 33 this previously measured 5.7 cm in corresponding diameter.  Aneurysmal dilatation of both common iliac arteries is again demonstrated. The right common iliac artery measures 2.5 cm in maximum transverse diameter, previously 2.3 cm. The left common iliac artery measures 1.9 cm in maximum transverse diameter, previously 2.0 cm. Atheromatous arterial calcifications and plaques are demonstrated, including neural thrombosed in the abdominal aortic aneurysm. No enlarged lymph nodes. No retroperitoneal hemorrhage. Reproductive: Prostate is unremarkable. Other: Small left paraumbilical hernia containing fat and mesenteric vessels without significant change. Musculoskeletal: Lumbar and lower thoracic spine degenerative changes. Review of the MIP images confirms the above findings. IMPRESSION: 1. No pulmonary emboli or acute abnormality. 2. Interval increase in size of the infrarenal abdominal aortic aneurysm, currently measuring 6.4 cm in maximum diameter. Vascular  surgery consultation recommended due to increased risk of rupture for AAA >5.5 cm. This recommendation follows ACR consensus guidelines: White Paper of the ACR Incidental Findings Committee II on Vascular Findings. J Am Coll Radiol 2013; 10:789-794. 3. Mildly progressed aneurysmal dilatation of the right common iliac artery with a maximum transverse diameter 2.5 cm. 4. Stable aneurysmal dilatation of the left common iliac artery with a maximum transverse diameter 1.9 cm. 5. Coronary artery atherosclerosis and aortic atherosclerosis. 6. Stable and mildly improved left lung nodules, as described above. A follow-up chest CT is recommended in 6-12 months. 7. COPD with extensive centrilobular emphysema. 8. Small left paraumbilical hernia containing fat and mesenteric vessels. 9. Sigmoid diverticulosis. Electronically Signed   By: Claudie Revering M.D.   On: 07/30/2016 11:54        Scheduled Meds: . atorvastatin  80 mg Oral Daily  . bisoprolol  5 mg Oral Daily  . doxycycline  100 mg Oral Q12H  . heparin subcutaneous   5,000 Units Subcutaneous Q8H  . hydrALAZINE  10 mg Oral TID  . ipratropium-albuterol  3 mL Nebulization QID  . isosorbide dinitrate  15 mg Oral Daily  . methylPREDNISolone (SOLU-MEDROL) injection  80 mg Intravenous Q8H  . mometasone-formoterol  2 puff Inhalation BID  . morphine  15 mg Oral TID  . roflumilast  500 mcg Oral Daily  . sodium chloride flush  3 mL Intravenous Q12H   Continuous Infusions: . albuterol 7.5 mg/hr (07/30/16 1200)     LOS: 2 days    Time spent: 25 min    [place], DO [facility] Pager [phone]  If 7PM-7AM, please contact night-coverage [email or web address] Password [email or web address] 08/01/2016, 10:56 AM

## 2016-08-01 NOTE — Progress Notes (Signed)
   VASCULAR SURGERY ASSESSMENT & PLAN:  Flank pain has essentially resolved.   I have reviewed his CT scan. He has a Type IV Thoracoabdominal Aneurysm and will need continued follow up in [facility]. We do not currently perform Thoracoabdominal Aneurysm repair. I have explained this to him and he is agreeable to continue follow up with Dr. Sammuel Hines.   SUBJECTIVE: No significant flank pain.  PHYSICAL EXAM: Vitals:   07/31/16 1544 07/31/16 2015 07/31/16 2043 08/01/16 0429  BP:  117/77  118/74  Pulse:  73  66  Resp:      Temp:  97.7 F (36.5 C)  98.1 F (36.7 C)  TempSrc:  Oral  Oral  SpO2: 96% 92% 95% 90%  Weight:    154 lb 9.6 oz (70.1 kg)  Height:       Abdomen soft and non-tender  LABS: Lab Results  Component Value Date   WBC 10.1 07/31/2016   HGB 14.1 07/31/2016   HCT 42.1 07/31/2016   MCV 90.3 07/31/2016   PLT 165 07/31/2016   Lab Results  Component Value Date   CREATININE 1.33 (H) 08/01/2016   Lab Results  Component Value Date   INR 0.92 07/30/2016    Active Problems:   Abdominal aortic aneurysm [facility])   Hyperlipidemia   Essential hypertension   Cerebrovascular disease   Coronary artery disease   COPD exacerbation ([place])   Gae Gallop [id number] 08/01/2016

## 2016-08-01 NOTE — Progress Notes (Addendum)
The client had 10 beats of VT and he was asymptomatic  Retail [employer] with [facility]. Lab work was ordered. I will continue to monitor the client closely.   Saddie Benders RN

## 2016-08-02 ENCOUNTER — Encounter (HOSPITAL_COMMUNITY): Payer: Self-pay | Admitting: General Practice

## 2016-08-02 MED ORDER — INFLUENZA VAC SPLIT QUAD 0.5 ML IM SUSY
0.5000 mL | PREFILLED_SYRINGE | INTRAMUSCULAR | Status: AC
  Administered 2016-08-03: 0.5 mL via INTRAMUSCULAR
  Filled 2016-08-02: qty 0.5

## 2016-08-02 MED ORDER — MORPHINE SULFATE 15 MG PO TABS
15.0000 mg | ORAL_TABLET | Freq: Four times a day (QID) | ORAL | Status: DC
  Administered 2016-08-02 – 2016-08-05 (×13): 15 mg via ORAL
  Filled 2016-08-02 (×13): qty 1

## 2016-08-02 MED ORDER — METHYLPREDNISOLONE SODIUM SUCC 125 MG IJ SOLR
60.0000 mg | Freq: Two times a day (BID) | INTRAMUSCULAR | Status: AC
  Administered 2016-08-02 – 2016-08-03 (×3): 60 mg via INTRAVENOUS
  Filled 2016-08-02 (×3): qty 2

## 2016-08-02 NOTE — Progress Notes (Signed)
PROGRESS NOTE    [name]  [id number] DOB: [birth year] DOA: 07/30/2016 PCP: [name], MD   Outpatient Specialists:     Brief Narrative:  [name]  is a 65 y.o. male, with a past medical history significant for COPD, tobacco abuse, AAA, HTN, HLD ischemic cardiomyopathy, CAD; who presented with complaints , who presents with complaints of worsening dyspnea over the last 3 days, and cough with productive green sputum as well, has been prescribed prednisone taper and azithromycin by his PCP, but reports dyspnea has been worsening which prompted him to come to ED, denies any chest pain, fever or chills, coffee-ground emesis, reports nausea and vomiting yesterday but no recurrence today, as well reports bilateral flank pain. - in ED CTA chest abdomen pelvis was obtained which was negative for PE, but with evidence of worsening infrarenal AAA to 6.4 cm, sent by vascular surgery, as well patient had one episode of A. fib in ED after receiving albuterol, converted back to normal sinus rhythm with IV Cardizem, and reports improvement of symptoms after receiving steroids and continuous type treatment.   Assessment & Plan:   Active Problems:   Hyperlipidemia   Essential hypertension   Cerebrovascular disease   Abdominal aortic aneurysm (HCC)   Coronary artery disease   COPD exacerbation (HCC)   COPD exacerbation -  history of COPD followed by Dr. Ashok Cordia -  actively smoking -  IV solumedrol- wean as tolerated -  nebs every 6 hours, when necessary albuterol, continue with Spiriva, continue with the Mucinex, -  doxy -home O2 eval in AM  Abdominal aortic aneurysm -  vascular surgery  -  not a candidate for elective and potentially not even for urgent repair -outpatient follow up  Atrial fibrillation - brief episode of A. fib in ED  -  Admitting doc discussed with Dr Casimer Lanius, he did not recommend immediate anticoagulation, this issue can be followed as an outpatient with  his primary cardiologist  - patient back in normal sinus rhythm, if patient goes back into A. fib, then will need an official cardiology consult.  History of CAD - Continue with aspirin, statin and beta blockers, denies any chest pain  Hyperlipidemia - Continue with statin  Tobacco abuse - Counseled at length, will start on nicotine patch  Lung nodules: CT chest repeat 6-12 months  Chronic pain -increase TID morphine to QID  DVT prophylaxis:  SQ Heparin  Code Status: Full Code   Family Communication:   Disposition Plan:     Consultants:       Subjective: Breathing slightly improved  Objective: Vitals:   08/01/16 1838 08/01/16 2134 08/02/16 0552 08/02/16 0859  BP:  114/78 129/73   Pulse: 65 63 60   Resp: 18 17 17    Temp:  97.6 F (36.4 C) 97.5 F (36.4 C)   TempSrc:  Oral Oral   SpO2: 94% 96% 92% 93%  Weight:   70.5 kg (155 lb 8 oz)   Height:        Intake/Output Summary (Last 24 hours) at 08/02/16 1204 Last data filed at 08/02/16 0900  Gross per 24 hour  Intake              240 ml  Output              300 ml  Net              -60 ml   Filed Weights   07/31/16 0453 08/01/16 0429 08/02/16 [id number]  Weight: 69.9 kg (154 lb) 70.1 kg (154 lb 9.6 oz) 70.5 kg (155 lb 8 oz)    Examination:  General exam: Appears calm and comfortable  Respiratory system: moving more air- expiratory wheezing only Cardiovascular system: S1 & S2 heard, RRR. No JVD, murmurs, rubs, gallops or clicks. No pedal edema. Gastrointestinal system: Abdomen is nondistended, soft and nontender. No organomegaly or masses felt. Normal bowel sounds heard. Central nervous system: Alert and oriented. No focal neurological deficits.     Data Reviewed: I have personally reviewed following labs and imaging studies  CBC:  Recent Labs Lab 07/30/16 0933 07/30/16 0946 07/30/16 1649 07/31/16 0930  WBC 13.5*  --  9.6 10.1  NEUTROABS 9.9*  --   --   --   HGB 14.4 15.0 14.5 14.1    HCT 43.3 44.0 43.7 42.1  MCV 91.4  --  91.2 90.3  PLT 194  --  186 [id number]   Basic Metabolic Panel:  Recent Labs Lab 07/30/16 0946 07/30/16 1110 07/30/16 1649 07/31/16 0930 08/01/16 0633  NA 134* 136  --  135 135  K 3.6 3.7  --  4.2 4.4  CL 96* 96*  --  94* 97*  CO2  --  29  --  29 28  GLUCOSE 96 93  --  209* 160*  BUN 23* 23*  --  31* 41*  CREATININE 1.20 1.17 1.43* 1.47* 1.33*  CALCIUM  --  9.8  --  9.2 8.8*  MG  --   --   --   --  2.5*   GFR: Estimated Creatinine Clearance: 50.4 mL/min (by C-G formula based on SCr of 1.33 mg/dL (H)). Liver Function Tests:  Recent Labs Lab 07/30/16 1110  AST 32  ALT 44  ALKPHOS 103  BILITOT 1.2  PROT 7.5  ALBUMIN 4.6   No results for input(s): LIPASE, AMYLASE in the last 168 hours. No results for input(s): AMMONIA in the last 168 hours. Coagulation Profile:  Recent Labs Lab 07/30/16 1326  INR 0.92   Cardiac Enzymes: No results for input(s): CKTOTAL, CKMB, CKMBINDEX, TROPONINI in the last 168 hours. BNP (last 3 results) No results for input(s): PROBNP in the last 8760 hours. HbA1C: No results for input(s): HGBA1C in the last 72 hours. CBG: No results for input(s): GLUCAP in the last 168 hours. Lipid Profile: No results for input(s): CHOL, HDL, LDLCALC, TRIG, CHOLHDL, LDLDIRECT in the last 72 hours. Thyroid Function Tests: No results for input(s): TSH, T4TOTAL, FREET4, T3FREE, THYROIDAB in the last 72 hours. Anemia Panel: No results for input(s): VITAMINB12, FOLATE, FERRITIN, TIBC, IRON, RETICCTPCT in the last 72 hours. Urine analysis:    Component Value Date/Time   COLORURINE YELLOW 04/17/2013 1650   APPEARANCEUR CLEAR 04/17/2013 1650   LABSPEC 1.018 04/17/2013 1650   PHURINE 5.0 04/17/2013 1650   GLUCOSEU NEGATIVE 04/17/2013 1650   HGBUR NEGATIVE 04/17/2013 1650   BILIRUBINUR NEGATIVE 04/17/2013 1650   KETONESUR NEGATIVE 04/17/2013 1650   PROTEINUR NEGATIVE 04/17/2013 1650   UROBILINOGEN 0.2 04/17/2013 1650    NITRITE NEGATIVE 04/17/2013 1650   LEUKOCYTESUR NEGATIVE 04/17/2013 1650     )No results found for this or any previous visit (from the past 240 hour(s)).    Anti-infectives    Start     Dose/Rate Route Frequency Ordered Stop   07/30/16 1330  doxycycline (VIBRA-TABS) tablet 100 mg     100 mg Oral Every 12 hours 07/30/16 1324         Radiology Studies: No  results found.      Scheduled Meds: . atorvastatin  80 mg Oral Daily  . bisoprolol  5 mg Oral Daily  . doxycycline  100 mg Oral Q12H  . heparin subcutaneous  5,000 Units Subcutaneous Q8H  . hydrALAZINE  10 mg Oral TID  . ipratropium-albuterol  3 mL Nebulization QID  . isosorbide dinitrate  15 mg Oral Daily  . methylPREDNISolone (SOLU-MEDROL) injection  60 mg Intravenous Q12H  . mometasone-formoterol  2 puff Inhalation BID  . morphine  15 mg Oral QID  . roflumilast  500 mcg Oral Daily  . sodium chloride flush  3 mL Intravenous Q12H   Continuous Infusions: . albuterol 7.5 mg/hr (07/30/16 1200)     LOS: 3 days    Time spent: 25 min    [place], DO [facility] Pager [phone]  If 7PM-7AM, please contact night-coverage [email or web address] Password [email or web address] 08/02/2016, 12:04 PM

## 2016-08-02 NOTE — Progress Notes (Addendum)
SATURATION QUALIFICATIONS: (This note is used to comply with regulatory documentation for home oxygen)  Patient Saturations on Room Air at Rest = 89 %  Patient Saturations on Room Air while Ambulating = 83 %  Patient Saturations on 2 Liters of oxygen while Ambulating = 89  %  Please briefly explain why patient needs home oxygen: Pt was very short of breath at end of ambulation with notable audible inspiratory and expiratory wheezing.

## 2016-08-03 ENCOUNTER — Other Ambulatory Visit: Payer: Self-pay

## 2016-08-03 ENCOUNTER — Encounter: Payer: Self-pay | Admitting: Cardiology

## 2016-08-03 MED ORDER — PREDNISONE 20 MG PO TABS
50.0000 mg | ORAL_TABLET | Freq: Every day | ORAL | Status: DC
  Administered 2016-08-04 – 2016-08-05 (×2): 50 mg via ORAL
  Filled 2016-08-03 (×2): qty 2

## 2016-08-03 NOTE — Patient Outreach (Signed)
   Patient remains hospitalized. Will continue to follow for disposition.

## 2016-08-03 NOTE — Progress Notes (Signed)
PROGRESS NOTE    [name]  [id number] DOB: [birth year] DOA: 07/30/2016 PCP: [name], MD   Outpatient Specialists:     Brief Narrative:  [name]  is a 65 y.o. male, with a past medical history significant for COPD, tobacco abuse, AAA, HTN, HLD ischemic cardiomyopathy, CAD; who presented with complaints , who presents with complaints of worsening dyspnea over the last 3 days, and cough with productive green sputum as well, has been prescribed prednisone taper and azithromycin by his PCP, but reports dyspnea has been worsening which prompted him to come to ED, denies any chest pain, fever or chills, coffee-ground emesis, reports nausea and vomiting yesterday but no recurrence today, as well reports bilateral flank pain. - in ED CTA chest abdomen pelvis was obtained which was negative for PE, but with evidence of worsening infrarenal AAA to 6.4 cm, sent by vascular surgery, as well patient had one episode of A. fib in ED after receiving albuterol, converted back to normal sinus rhythm with IV Cardizem, and reports improvement of symptoms after receiving steroids and continuous type treatment.   Assessment & Plan:   Active Problems:   Hyperlipidemia   Essential hypertension   Cerebrovascular disease   Abdominal aortic aneurysm (HCC)   Coronary artery disease   COPD exacerbation (HCC)   COPD exacerbation -  history of COPD followed by Dr. Ashok Cordia -  actively smoking -  IV solumedrol- wean as tolerated-- will try PO in AM -  nebs every 6 hours, when necessary albuterol, continue with Spiriva, continue with the Mucinex, -  doxy -home O2 eval-- suspect will need 24/7 for a while  Abdominal aortic aneurysm -  vascular surgery  -  not a candidate for elective and potentially not even for urgent repair -outpatient follow up at [facility]  Atrial fibrillation - brief episode of A. fib in ED  -  Admitting doc discussed with Dr Casimer Lanius, he did not recommend immediate  anticoagulation, this issue can be followed as an outpatient with his primary cardiologist  - patient back in normal sinus rhythm, if patient goes back into A. fib, then will need an official cardiology consult.  History of CAD - Continue with aspirin, statin and beta blockers, denies any chest pain  Hyperlipidemia - Continue with statin  Tobacco abuse - Counseled at length, will start on nicotine patch  Lung nodules: CT chest repeat 6-12 months  Chronic pain -increase TID morphine to QID-- patient did better with this regimen-- will need new prescription   DVT prophylaxis:  SQ Heparin  Code Status: partial   Family Communication:   Disposition Plan:  Home in AM?   Consultants:       Subjective: Continues to slowly improve Pain better controlled  Objective: Vitals:   08/02/16 2100 08/03/16 0500 08/03/16 0734 08/03/16 0800  BP: (!) 151/90 (!) 155/59  123/74  Pulse: 67 66  64  Resp: (!) 21 (!) 21    Temp: 97.7 F (36.5 C) 98 F (36.7 C)  97.8 F (36.6 C)  TempSrc:    Oral  SpO2: 94% 97% 95% 95%  Weight:  70.5 kg (155 lb 6.4 oz)    Height:        Intake/Output Summary (Last 24 hours) at 08/03/16 1144 Last data filed at 08/03/16 0800  Gross per 24 hour  Intake              960 ml  Output  0 ml  Net              960 ml   Filed Weights   08/01/16 0429 08/02/16 0552 08/03/16 0500  Weight: 70.1 kg (154 lb 9.6 oz) 70.5 kg (155 lb 8 oz) 70.5 kg (155 lb 6.4 oz)    Examination:  General exam: Appears calm and comfortable  Respiratory system: moving more air- expiratory wheezing only Cardiovascular system: S1 & S2 heard, RRR. No JVD, murmurs, rubs, gallops or clicks. No pedal edema. Gastrointestinal system: Abdomen is nondistended, soft and nontender. No organomegaly or masses felt. Normal bowel sounds heard. Central nervous system: Alert and oriented. No focal neurological deficits.     Data Reviewed: I have personally reviewed  following labs and imaging studies  CBC:  Recent Labs Lab 07/30/16 0933 07/30/16 0946 07/30/16 1649 07/31/16 0930  WBC 13.5*  --  9.6 10.1  NEUTROABS 9.9*  --   --   --   HGB 14.4 15.0 14.5 14.1  HCT 43.3 44.0 43.7 42.1  MCV 91.4  --  91.2 90.3  PLT 194  --  186 [id number]   Basic Metabolic Panel:  Recent Labs Lab 07/30/16 0946 07/30/16 1110 07/30/16 1649 07/31/16 0930 08/01/16 0633  NA 134* 136  --  135 135  K 3.6 3.7  --  4.2 4.4  CL 96* 96*  --  94* 97*  CO2  --  29  --  29 28  GLUCOSE 96 93  --  209* 160*  BUN 23* 23*  --  31* 41*  CREATININE 1.20 1.17 1.43* 1.47* 1.33*  CALCIUM  --  9.8  --  9.2 8.8*  MG  --   --   --   --  2.5*   GFR: Estimated Creatinine Clearance: 50.4 mL/min (by C-G formula based on SCr of 1.33 mg/dL (H)). Liver Function Tests:  Recent Labs Lab 07/30/16 1110  AST 32  ALT 44  ALKPHOS 103  BILITOT 1.2  PROT 7.5  ALBUMIN 4.6   No results for input(s): LIPASE, AMYLASE in the last 168 hours. No results for input(s): AMMONIA in the last 168 hours. Coagulation Profile:  Recent Labs Lab 07/30/16 1326  INR 0.92   Cardiac Enzymes: No results for input(s): CKTOTAL, CKMB, CKMBINDEX, TROPONINI in the last 168 hours. BNP (last 3 results) No results for input(s): PROBNP in the last 8760 hours. HbA1C: No results for input(s): HGBA1C in the last 72 hours. CBG: No results for input(s): GLUCAP in the last 168 hours. Lipid Profile: No results for input(s): CHOL, HDL, LDLCALC, TRIG, CHOLHDL, LDLDIRECT in the last 72 hours. Thyroid Function Tests: No results for input(s): TSH, T4TOTAL, FREET4, T3FREE, THYROIDAB in the last 72 hours. Anemia Panel: No results for input(s): VITAMINB12, FOLATE, FERRITIN, TIBC, IRON, RETICCTPCT in the last 72 hours. Urine analysis:    Component Value Date/Time   COLORURINE YELLOW 04/17/2013 1650   APPEARANCEUR CLEAR 04/17/2013 1650   LABSPEC 1.018 04/17/2013 1650   PHURINE 5.0 04/17/2013 1650   GLUCOSEU NEGATIVE  04/17/2013 1650   HGBUR NEGATIVE 04/17/2013 1650   BILIRUBINUR NEGATIVE 04/17/2013 1650   KETONESUR NEGATIVE 04/17/2013 1650   PROTEINUR NEGATIVE 04/17/2013 1650   UROBILINOGEN 0.2 04/17/2013 1650   NITRITE NEGATIVE 04/17/2013 1650   LEUKOCYTESUR NEGATIVE 04/17/2013 1650     )No results found for this or any previous visit (from the past 240 hour(s)).    Anti-infectives    Start     Dose/Rate Route Frequency Ordered Stop  07/30/16 1330  doxycycline (VIBRA-TABS) tablet 100 mg     100 mg Oral Every 12 hours 07/30/16 1324         Radiology Studies: No results found.      Scheduled Meds: . atorvastatin  80 mg Oral Daily  . bisoprolol  5 mg Oral Daily  . doxycycline  100 mg Oral Q12H  . heparin subcutaneous  5,000 Units Subcutaneous Q8H  . hydrALAZINE  10 mg Oral TID  . ipratropium-albuterol  3 mL Nebulization QID  . isosorbide dinitrate  15 mg Oral Daily  . methylPREDNISolone (SOLU-MEDROL) injection  60 mg Intravenous Q12H  . mometasone-formoterol  2 puff Inhalation BID  . morphine  15 mg Oral QID  . roflumilast  500 mcg Oral Daily  . sodium chloride flush  3 mL Intravenous Q12H   Continuous Infusions: . albuterol 7.5 mg/hr (07/30/16 1200)     LOS: 4 days    Time spent: 25 min    [place], DO [facility] Pager [phone]  If 7PM-7AM, please contact night-coverage [email or web address] Password [facility] 08/03/2016, 11:44 AM

## 2016-08-03 NOTE — Care Management Important Message (Signed)
Important Message  Patient Details  Name: [name] MRN: [id number] Date of Birth: March 26, 1951   Medicare Important Message Given:  Yes    [name] 08/03/2016, 10:16 AM

## 2016-08-04 DIAGNOSIS — E785 Hyperlipidemia, unspecified: Secondary | ICD-10-CM

## 2016-08-04 LAB — BASIC METABOLIC PANEL
ANION GAP: 8 (ref 5–15)
BUN: 42 mg/dL — ABNORMAL HIGH (ref 6–20)
CALCIUM: 8.8 mg/dL — AB (ref 8.9–10.3)
CO2: 25 mmol/L (ref 22–32)
CREATININE: 1.16 mg/dL (ref 0.61–1.24)
Chloride: 102 mmol/L (ref 101–111)
GLUCOSE: 143 mg/dL — AB (ref 65–99)
Potassium: 4.7 mmol/L (ref 3.5–5.1)
Sodium: 135 mmol/L (ref 135–145)

## 2016-08-04 LAB — CBC
HEMATOCRIT: 40.5 % (ref 39.0–52.0)
Hemoglobin: 13.4 g/dL (ref 13.0–17.0)
MCH: 29.9 pg (ref 26.0–34.0)
MCHC: 33.1 g/dL (ref 30.0–36.0)
MCV: 90.4 fL (ref 78.0–100.0)
PLATELETS: 135 10*3/uL — AB (ref 150–400)
RBC: 4.48 MIL/uL (ref 4.22–5.81)
RDW: 14.5 % (ref 11.5–15.5)
WBC: 11 10*3/uL — AB (ref 4.0–10.5)

## 2016-08-04 MED ORDER — IPRATROPIUM-ALBUTEROL 0.5-2.5 (3) MG/3ML IN SOLN
3.0000 mL | Freq: Three times a day (TID) | RESPIRATORY_TRACT | Status: DC
  Administered 2016-08-04 – 2016-08-05 (×4): 3 mL via RESPIRATORY_TRACT
  Filled 2016-08-04 (×4): qty 3

## 2016-08-04 NOTE — Care Management Note (Addendum)
Case Management Note  Patient Details  Name: [name] MRN: [id number] Date of Birth: [birth year]  Subjective/Objective:   Pt presented for COPD Exacerbation. Pt is from home with the support of wife. Previously on 02 at 2L qhs. Pt will need continuous 02 once stable for d/c.                  Action/Plan: CM did speak with pt and he is active with [employer] for 02. CM did call Liaison to get a portable tank delivered for Sat d/c. CM to fax Saturation Qualifications to [place] along with new order for delivery. No HH needs at this time.   Expected Discharge Date:                  Expected Discharge Plan:  Home/Self Care  In-House Referral:  NA  Discharge planning Services  CM Consult  Post Acute Care Choice:  Durable Medical Equipment Choice offered to:  Patient  DME Arranged:  Oxygen DME Agency:  [facility]  HH Arranged:  NA HH Agency:  NA  Status of Service:  Completed, signed off  If discussed at [place] of Stay Meetings, dates discussed:    Additional Comments: [place] 08-04-16 Page Spiro, BSN [phone] CM did speak with [name] with [facility]. He verified that patient has continuous 02 at home. Pt usually wears at night only. Pt has a portable tank that was not working well -CM did make [name] aware. [employer] to go out to patient's home today and make sure portable is working. Wife to bring tank for home. Per [name] no need to re-qualify patient for 02. No further needs from CM @ this time.  Bethena Roys, RN 08/04/2016, 12:22 PM

## 2016-08-04 NOTE — Progress Notes (Signed)
PROGRESS NOTE    KAMAURIE SCHNELLER  [id number] DOB: 11/20/1950 DOA: 07/30/2016 PCP: [name], MD   Brief Narrative: RAJON NEITZKE is a 65 y.o. malewith a past medical history significant for COPD, tobacco abuse, AAA, HTN, HLD ischemic cardiomyopathy, CAD. He presented with worsening dyspnea and found to have a COPD exacerbation.   Assessment & Plan:   Active Problems:   Hyperlipidemia   Essential hypertension   Cerebrovascular disease   Abdominal aortic aneurysm (HCC)   Coronary artery disease   COPD exacerbation ([place])   COPD exacerbation Patient follows outpatient with Dr. Ashok Cordia. Patient is a smoker. He has improved. He is still requiring 2L of oxygen -continue prednisone -continue DuoNeb -continue Spiriva -continue doxycycline  Abdominal aortic aneurysm Assessed by vascular surgery. Poor candidate for repair. Follows [facility] at [facility]. -outpatient follow-up at [facility]  Atrial fibrillation Patient was briefly in A fib in the ED. Paroxysmal? Has not been on anticoagulation. CHA2DS2-VASc Score is 4.  History of CAD -continue aspirin -continue statin -continue betablocker  Hyperlipidemia -continue statin  Tobacco abuse Patient counseled -continue nicotine patch  Lung nodules Repeat chest CT in 6-12 months  Chronic pain -Continue morphine  DVT prophylaxis: Heparin subq Code Status: Partial: DNI, no CPR, no defibrillation Family Communication: None at bedside Disposition Plan: Likely discharge home tomorrow morning   Consultants:   None  Procedures:  None  Antimicrobials:  Doxycycline (10/29>>    Subjective: Patient reports breathing better but still has dyspnea with ambulation. No chest pain.  Objective: Vitals:   08/04/16 0511 08/04/16 0848 08/04/16 0850 08/04/16 1354  BP: 133/84   121/73  Pulse: (!) 58   (!) 58  Resp:    18  Temp: 98.1 F (36.7 C)   98.1 F (36.7 C)  TempSrc: Oral   Oral  SpO2: 96% 97% 97% 96%  Weight: 70.4 kg (155 lb  3.2 oz)     Height:        Intake/Output Summary (Last 24 hours) at 08/04/16 1403 Last data filed at 08/04/16 1355  Gross per 24 hour  Intake              480 ml  Output                2 ml  Net              478 ml   Filed Weights   08/02/16 0552 08/03/16 0500 08/04/16 0511  Weight: 70.5 kg (155 lb 8 oz) 70.5 kg (155 lb 6.4 oz) 70.4 kg (155 lb 3.2 oz)    Examination:  General exam: Appears calm and comfortable Respiratory system: Diffuse wheezing with rhonchi and prolonged expiratory phase. Respiratory effort normal. Cardiovascular system: S1 & S2 heard, RRR. No murmurs, rubs, gallops or clicks. Gastrointestinal system: Abdomen is nondistended, soft and nontender. Normal bowel sounds heard. Central nervous system: Alert and oriented. No focal neurological deficits. Extremities: No edema. No calf tenderness Skin: No cyanosis. No rashes Psychiatry: Judgement and insight appear normal. Mood & affect appropriate.     Data Reviewed: I have personally reviewed following labs and imaging studies  CBC:  Recent Labs Lab 07/30/16 0933 07/30/16 0946 07/30/16 1649 07/31/16 0930 08/04/16 0245  WBC 13.5*  --  9.6 10.1 11.0*  NEUTROABS 9.9*  --   --   --   --   HGB 14.4 15.0 14.5 14.1 13.4  HCT 43.3 44.0 43.7 42.1 40.5  MCV 91.4  --  91.2 90.3 90.4  PLT 194  --  186 165 [id number]*   Basic Metabolic Panel:  Recent Labs Lab 07/30/16 0946 07/30/16 1110 07/30/16 1649 07/31/16 0930 08/01/16 0633 08/04/16 0245  NA 134* 136  --  135 135 135  K 3.6 3.7  --  4.2 4.4 4.7  CL 96* 96*  --  94* 97* 102  CO2  --  29  --  29 28 25   GLUCOSE 96 93  --  209* 160* 143*  BUN 23* 23*  --  31* 41* 42*  CREATININE 1.20 1.17 1.43* 1.47* 1.33* 1.16  CALCIUM  --  9.8  --  9.2 8.8* 8.8*  MG  --   --   --   --  2.5*  --    GFR: Estimated Creatinine Clearance: 57.8 mL/min (by C-G formula based on SCr of 1.16 mg/dL). Liver Function Tests:  Recent Labs Lab 07/30/16 1110  AST 32  ALT 44    ALKPHOS 103  BILITOT 1.2  PROT 7.5  ALBUMIN 4.6   No results for input(s): LIPASE, AMYLASE in the last 168 hours. No results for input(s): AMMONIA in the last 168 hours. Coagulation Profile:  Recent Labs Lab 07/30/16 1326  INR 0.92   Cardiac Enzymes: No results for input(s): CKTOTAL, CKMB, CKMBINDEX, TROPONINI in the last 168 hours. BNP (last 3 results) No results for input(s): PROBNP in the last 8760 hours. HbA1C: No results for input(s): HGBA1C in the last 72 hours. CBG: No results for input(s): GLUCAP in the last 168 hours. Lipid Profile: No results for input(s): CHOL, HDL, LDLCALC, TRIG, CHOLHDL, LDLDIRECT in the last 72 hours. Thyroid Function Tests: No results for input(s): TSH, T4TOTAL, FREET4, T3FREE, THYROIDAB in the last 72 hours. Anemia Panel: No results for input(s): VITAMINB12, FOLATE, FERRITIN, TIBC, IRON, RETICCTPCT in the last 72 hours. Sepsis Labs: No results for input(s): PROCALCITON, LATICACIDVEN in the last 168 hours.  No results found for this or any previous visit (from the past 240 hour(s)).       Radiology Studies: No results found.      Scheduled Meds: . atorvastatin  80 mg Oral Daily  . bisoprolol  5 mg Oral Daily  . doxycycline  100 mg Oral Q12H  . heparin subcutaneous  5,000 Units Subcutaneous Q8H  . hydrALAZINE  10 mg Oral TID  . ipratropium-albuterol  3 mL Nebulization TID  . isosorbide dinitrate  15 mg Oral Daily  . mometasone-formoterol  2 puff Inhalation BID  . morphine  15 mg Oral QID  . predniSONE  50 mg Oral Q breakfast  . roflumilast  500 mcg Oral Daily  . sodium chloride flush  3 mL Intravenous Q12H   Continuous Infusions: . albuterol 7.5 mg/hr (07/30/16 1200)     LOS: 5 days     Cordelia Poche [facility] 08/04/2016, 2:03 PM Pager: [phone]  If 7PM-7AM, please contact night-coverage [email or web address] Password [email or web address] 08/04/2016, 2:03 PM

## 2016-08-05 ENCOUNTER — Other Ambulatory Visit: Payer: Self-pay | Admitting: Family Medicine

## 2016-08-05 DIAGNOSIS — I2583 Coronary atherosclerosis due to lipid rich plaque: Secondary | ICD-10-CM

## 2016-08-05 DIAGNOSIS — I679 Cerebrovascular disease, unspecified: Secondary | ICD-10-CM

## 2016-08-05 DIAGNOSIS — Z23 Encounter for immunization: Secondary | ICD-10-CM | POA: Diagnosis not present

## 2016-08-05 DIAGNOSIS — I2510 Atherosclerotic heart disease of native coronary artery without angina pectoris: Secondary | ICD-10-CM

## 2016-08-05 MED ORDER — MOMETASONE FURO-FORMOTEROL FUM 200-5 MCG/ACT IN AERO: 2.0000 | INHALATION_SPRAY | Freq: Two times a day (BID) | RESPIRATORY_TRACT | 0 refills | Status: DC

## 2016-08-05 MED ORDER — IPRATROPIUM-ALBUTEROL 0.5-2.5 (3) MG/3ML IN SOLN: 3.0000 mL | Freq: Four times a day (QID) | RESPIRATORY_TRACT | 3 refills | Status: DC

## 2016-08-05 MED ORDER — MORPHINE SULFATE 15 MG PO TABS: 15.0000 mg | ORAL_TABLET | Freq: Four times a day (QID) | ORAL | 0 refills | Status: DC

## 2016-08-05 MED ORDER — ROFLUMILAST 500 MCG PO TABS: 500.0000 ug | ORAL_TABLET | Freq: Every day | ORAL | 0 refills | Status: DC

## 2016-08-05 MED ORDER — PREDNISONE 10 MG PO TABS: ORAL_TABLET | ORAL | 0 refills | Status: DC

## 2016-08-05 MED ORDER — DOXYCYCLINE HYCLATE 100 MG PO TABS: 100.0000 mg | ORAL_TABLET | Freq: Once | ORAL | 0 refills | Status: AC

## 2016-08-05 NOTE — Discharge Summary (Signed)
Physician Discharge Summary  EDMON EVILSIZER [id number] DOB: Jun 15, 1951 DOA: 07/30/2016  PCP: [name], MD  Admit date: 07/30/2016 Discharge date: 08/07/2016  Admitted From: Home Disposition:  Home  Recommendations for Outpatient Follow-up:  1. Follow up with PCP in 1 week 2. Outpatient follow-up for abdominal aortic aneurysm. Assessed and deemed poor candidate for repair 3. Outpatient follow-up with cardiology for possible event monitor 4. Follow-up for repeat CT in 6-12 months   Discharge Condition: Stable CODE STATUS: Full code   Brief/Interim Summary:  HPI written by Dr. Phillips Climes on 07/30/2016  [name]  is a 65 y.o. male, with a past medical history significant for COPD, tobacco abuse, AAA, HTN, HLD ischemic cardiomyopathy, CAD; who presented with complaints , who presents with complaints of worsening dyspnea over the last 3 days, and cough with productive green sputum as well, has been prescribed prednisone taper and azithromycin by his PCP, but reports dyspnea has been worsening which prompted him to come to ED, denies any chest pain, fever or chills, coffee-ground emesis, reports nausea and vomiting yesterday but no recurrence today, as well reports bilateral flank pain. - in ED CTA chest abdomen pelvis was obtained which was negative for PE, but with evidence of worsening infrarenal AAA to 6.4 cm, sent by vascular surgery, as well patient had one episode of A. fib in ED after receiving albuterol, converted back to normal sinus rhythm with IV Cardizem, and reports improvement of symptoms after receiving steroids and continuous type treatment.   Hospital course:  COPD exacerbation Patient follows outpatient with Dr. Milinda Hirschfeld, pulmonology. He was on oxygen at night. Patient is a smoker. CT negative for PE. He was started on solumedrol, DuoNeb, Spiriva, Dulera and doxycycline. Steroids transitioned to PO prednisone. Patient continued to require 2L O2 with  desaturations on ambulation. Patient's wheezing improved with improvement in exercise tolerance.  Abdominal aortic aneurysm Assessed by vascular surgery. Poor candidate for repair. Follows up at [facility].  Atrial fibrillation  A. Fib seen in ED. Patient was asymptomatic on admission. CHA2DS2-VASc Scoreis 4.  History of CAD Continued aspirin, statin and betablocker  Hyperlipidemia Continued statin  Tobacco abuse Patient counseled. Nicotine patch given  Lung nodules Seen on CT scan. Patient is a smoker. Repeat CT in 6-12 months  Chronic pain Morphine was increased in frequency. Continue at discharge.   Discharge Diagnoses:  Active Problems:   Hyperlipidemia   Essential hypertension   Cerebrovascular disease   Abdominal aortic aneurysm [facility])   Coronary artery disease   COPD exacerbation [facility])    Discharge Instructions  Discharge Instructions    AMB Referral to [place] Management    Complete by:  As directed    Please assign to [place] for COPD disease and symptom management. Will need [place] follow up on medication affordability- especially inhalers. In doughnut hole. Active consent on file. Currently at [facility]. Please call with questions. Marthenia Rolling, MSN-Ed, [facility] [id number]   Reason for consult:  Please assign to [place]. Also need [place] follow up.   Diagnoses of:  COPD/ Pneumonia   Expected date of contact:  1-3 days (reserved for hospital discharges)       Medication List    STOP taking these medications   amitriptyline 25 MG tablet Commonly known as:  ELAVIL   azithromycin 500 MG tablet Commonly known as:  ZITHROMAX   budesonide-formoterol 160-4.5 MCG/ACT inhaler Commonly known as:  SYMBICORT Replaced by:  mometasone-formoterol 200-5  MCG/ACT Aero   ipratropium 0.02 % nebulizer solution Commonly known as:  ATROVENT     TAKE these medications   accu-chek soft touch lancets Test once  daily Dx E11.9   albuterol 108 (90 Base) MCG/ACT inhaler Commonly known as:  PROVENTIL HFA;VENTOLIN HFA Inhale 2 puffs into the lungs every 6 (six) hours as needed for wheezing or shortness of breath.   albuterol (5 MG/ML) 0.5% nebulizer solution Commonly known as:  PROVENTIL TAKE 0.5 ML(S) BY NEBULIZATION EVERY FOUR HOURS AS NEEDED   ALPRAZolam 0.25 MG tablet Commonly known as:  XANAX TAKE ONE TABLET BY MOUTH THREE TIMES DAILY AS NEEDED FOR ANXIETY   aspirin 325 MG tablet Take 325 mg by mouth daily.   atorvastatin 80 MG tablet Commonly known as:  LIPITOR Take 1 tablet (80 mg total) by mouth daily. PLEASE CONTACT OFFICE FOR ADDITIONAL REFILLS   b complex vitamins tablet Take 1 tablet by mouth daily.   bisoprolol 5 MG tablet Commonly known as:  ZEBETA TAKE 1 TABLET EVERY DAY   FLUTTER Devi Use as directed.   furosemide 20 MG tablet Commonly known as:  LASIX TAKE 1 TABLET EVERY DAY   glucose blood test strip Test once daily E11.9   ipratropium-albuterol 0.5-2.5 (3) MG/3ML Soln Commonly known as:  DUONEB Take 3 mLs by nebulization every 6 (six) hours. Use 4 times daily x 4 days, then every 6 hours as needed.   Iron 325 (65 Fe) MG Tabs Take 1 tablet by mouth daily.   isosorbide dinitrate 30 MG tablet Commonly known as:  ISORDIL TAKE 1/2 TABLET EVERY DAY   Krill Oil 1000 MG Caps Take 1 capsule by mouth daily.   mometasone-formoterol 200-5 MCG/ACT Aero Commonly known as:  DULERA Inhale 2 puffs into the lungs 2 (two) times daily. Replaces:  budesonide-formoterol 160-4.5 MCG/ACT inhaler   morphine 15 MG tablet Commonly known as:  MSIR Take 1 tablet (15 mg total) by mouth 4 (four) times daily. What changed:  when to take this   multivitamin per tablet Take 1 tablet by mouth daily.   predniSONE 10 MG tablet Commonly known as:  DELTASONE 40mg  X5 days, 30mg  X5 days, 20mg  X5 days, 10mg  X5 days. What changed:  additional instructions  Another medication with  the same name was removed. Continue taking this medication, and follow the directions you see here.   roflumilast 500 MCG Tabs tablet Commonly known as:  DALIRESP Take 1 tablet (500 mcg total) by mouth daily. What changed:  Another medication with the same name was changed. Make sure you understand how and when to take each.   roflumilast 500 MCG Tabs tablet Commonly known as:  DALIRESP Take 1 tablet (500 mcg total) by mouth daily. What changed:  See the new instructions.   traZODone 50 MG tablet Commonly known as:  DESYREL TAKE ONE-HALF TO ONE TABLET BY MOUTH AT BEDTIME AS NEEDED FOR SLEEP     ASK your doctor about these medications   doxycycline 100 MG tablet Commonly known as:  VIBRA-TABS Take 1 tablet (100 mg total) by mouth once. Ask about: Should I take this medication?      Follow-up Information    [name], MD. Schedule an appointment as soon as possible for a visit today.   Specialty:  Family Medicine Contact information: [place] [address] [phone]        Tera Partridge, MD. Schedule an appointment as soon as possible for a visit in 1 week(s).  Specialty:  Pulmonary Disease Contact information: [address] [place] [address] [phone]          Allergies  Allergen Reactions  . Cephalexin Anaphylaxis    REACTION: anaphylactic shock  . Moxifloxacin Other (See Comments)    REACTION: achilles tendon rupture  . Penicillins Hives    REACTION: rash Has patient had a PCN reaction causing immediate rash, facial/tongue/throat swelling, SOB or lightheadedness with hypotension:YES Has patient had a PCN reaction causing severe rash involving mucus membranes or skin necrosis: NO Has patient had a PCN reaction that required hospitalization NO Has patient had a PCN reaction occurring within the last 10 years: NO If all of the above answers are "NO", then may proceed with Cephalosporin use.  . Vancomycin      thrombocytopenia  . Beta Adrenergic Blockers Other (See Comments)    Can take bisoprolol, cannot take less selective BB due to severe copd     Consultations:  None   Procedures/Studies: Dg Chest 2 View  Result Date: 07/30/2016 CLINICAL DATA:  Shortness of breath EXAM: CHEST  2 VIEW COMPARISON:  07/26/2016 chest radiograph. FINDINGS: Stable cardiomediastinal silhouette with normal heart size and mildly tortuous atherosclerotic thoracic aorta. No pneumothorax. No pleural effusion. Emphysema. No pulmonary edema. Hyperinflated lungs. No acute consolidative airspace disease. IMPRESSION: Emphysema and hyperinflated lungs, suggesting COPD. No acute consolidative airspace disease. Aortic atherosclerosis. Electronically Signed   By: Ilona Sorrel M.D.   On: 07/30/2016 09:32   Dg Chest 2 View  Result Date: 07/26/2016 CLINICAL DATA:  Chronic Sob, HTN, x-smoker, COPD, Hx of CAD, MI. EXAM: CHEST  2 VIEW COMPARISON:  Chest CT, 05/29/2016.  Chest radiograph, 12/30/2015. FINDINGS: Cardiac silhouette is normal in size. No mediastinal or hilar masses. No evidence of adenopathy. Prominent pulmonary arteries noted similar to the prior radiographs. Lungs are hyperexpanded.  Lungs are clear. No pleural effusion or pneumothorax. Skeletal structures are demineralized but intact. IMPRESSION: 1. No acute cardiopulmonary disease. 2. COPD. Electronically Signed   By: Lajean Manes M.D.   On: 07/26/2016 11:57   Ct Angio Chest Pe W And/or Wo Contrast  Result Date: 07/30/2016 CLINICAL DATA:  Dyspnea. Bilateral lower back pain. History of abdominal aortic aneurysm. EXAM: CT ANGIOGRAPHY CHEST CT ABDOMEN AND PELVIS WITH CONTRAST TECHNIQUE: Multidetector CT imaging of the chest was performed using the standard protocol during bolus administration of intravenous contrast. Multiplanar CT image reconstructions and MIPs were obtained to evaluate the vascular anatomy. Multidetector CT imaging of the abdomen and pelvis was performed  using the standard protocol during bolus administration of intravenous contrast. CONTRAST:  100 mL ISOVUE-370 IOPAMIDOL (ISOVUE-300) INJECTION 76% COMPARISON:  Abdomen and pelvis CT dated 07/02/2013 and chest CT dated 04/12/2016. FINDINGS: CTA CHEST FINDINGS Cardiovascular: Satisfactory opacification of the pulmonary arteries to the segmental level. No evidence of pulmonary embolism. Normal heart size. Minimal pericardial effusion with a maximum thickness of 15 mm. Atheromatous arterial calcifications and plaques, involving the thoracic aorta and coronary arteries. Mediastinum/Nodes: No enlarged mediastinal, hilar, or axillary lymph nodes. Thyroid gland, trachea, and esophagus demonstrate no significant findings. Lungs/Pleura: Extensive bullous changes in both upper lobes. The 6 mm spiculated nodule in the left upper lobe seen on 04/12/2016 measures 5 mm in maximum diameter today on image number 13 of series 7. The previously demonstrated 2.4 x 1.0 cm mass-like density in the posterior aspect of the left upper lobe is smaller today, measuring 2.2 x 0.6 cm today. A previously demonstrated 6 mm nodule in the  superior segment of the left lower lobe currently measures 4 mm on image number 28 of series 7. No new nodules are seen. No pleural fluid. There is a small amount of bilateral lower lobe atelectasis or scarring. Musculoskeletal: Thoracic spine degenerative changes. Review of the MIP images confirms the above findings. CT ABDOMEN and PELVIS FINDINGS Hepatobiliary: No focal liver abnormality is seen. No gallstones, gallbladder wall thickening, or biliary dilatation. Pancreas: 2 small calculi in the head of the pancreas. No pancreatic mass seen. Spleen: Multiple calcified splenic granulomata. Adrenals/Urinary Tract: Stable right renal cyst and small left renal cyst. Normal appearing urinary bladder and ureters. No calculi or hydronephrosis. Normal appearing adrenal glands. Stomach/Bowel: Multiple sigmoid colon  diverticula. Unremarkable stomach and small bowel. Normal appendix. Vascular/Lymphatic: Again demonstrated is a large infrarenal abdominal aortic aneurysm, beginning at the level of the renal arteries and just below the origin of the superior mesenteric artery. This measures 6.4 cm in maximum diameter on image number 33 this previously measured 5.7 cm in corresponding diameter. Aneurysmal dilatation of both common iliac arteries is again demonstrated. The right common iliac artery measures 2.5 cm in maximum transverse diameter, previously 2.3 cm. The left common iliac artery measures 1.9 cm in maximum transverse diameter, previously 2.0 cm. Atheromatous arterial calcifications and plaques are demonstrated, including neural thrombosed in the abdominal aortic aneurysm. No enlarged lymph nodes. No retroperitoneal hemorrhage. Reproductive: Prostate is unremarkable. Other: Small left paraumbilical hernia containing fat and mesenteric vessels without significant change. Musculoskeletal: Lumbar and lower thoracic spine degenerative changes. Review of the MIP images confirms the above findings. IMPRESSION: 1. No pulmonary emboli or acute abnormality. 2. Interval increase in size of the infrarenal abdominal aortic aneurysm, currently measuring 6.4 cm in maximum diameter. Vascular surgery consultation recommended due to increased risk of rupture for AAA >5.5 cm. This recommendation follows ACR consensus guidelines: White Paper of the ACR Incidental Findings Committee II on Vascular Findings. J Am Coll Radiol 2013; 10:789-794. 3. Mildly progressed aneurysmal dilatation of the right common iliac artery with a maximum transverse diameter 2.5 cm. 4. Stable aneurysmal dilatation of the left common iliac artery with a maximum transverse diameter 1.9 cm. 5. Coronary artery atherosclerosis and aortic atherosclerosis. 6. Stable and mildly improved left lung nodules, as described above. A follow-up chest CT is recommended in 6-12  months. 7. COPD with extensive centrilobular emphysema. 8. Small left paraumbilical hernia containing fat and mesenteric vessels. 9. Sigmoid diverticulosis. Electronically Signed   By: Claudie Revering M.D.   On: 07/30/2016 11:54   Ct Abdomen Pelvis W Contrast  Result Date: 07/30/2016 CLINICAL DATA:  Dyspnea. Bilateral lower back pain. History of abdominal aortic aneurysm. EXAM: CT ANGIOGRAPHY CHEST CT ABDOMEN AND PELVIS WITH CONTRAST TECHNIQUE: Multidetector CT imaging of the chest was performed using the standard protocol during bolus administration of intravenous contrast. Multiplanar CT image reconstructions and MIPs were obtained to evaluate the vascular anatomy. Multidetector CT imaging of the abdomen and pelvis was performed using the standard protocol during bolus administration of intravenous contrast. CONTRAST:  100 mL ISOVUE-370 IOPAMIDOL (ISOVUE-300) INJECTION 76% COMPARISON:  Abdomen and pelvis CT dated 07/02/2013 and chest CT dated 04/12/2016. FINDINGS: CTA CHEST FINDINGS Cardiovascular: Satisfactory opacification of the pulmonary arteries to the segmental level. No evidence of pulmonary embolism. Normal heart size. Minimal pericardial effusion with a maximum thickness of 15 mm. Atheromatous arterial calcifications and plaques, involving the thoracic aorta and coronary arteries. Mediastinum/Nodes: No enlarged mediastinal, hilar, or axillary lymph nodes. Thyroid gland, trachea, and esophagus demonstrate  no significant findings. Lungs/Pleura: Extensive bullous changes in both upper lobes. The 6 mm spiculated nodule in the left upper lobe seen on 04/12/2016 measures 5 mm in maximum diameter today on image number 13 of series 7. The previously demonstrated 2.4 x 1.0 cm mass-like density in the posterior aspect of the left upper lobe is smaller today, measuring 2.2 x 0.6 cm today. A previously demonstrated 6 mm nodule in the superior segment of the left lower lobe currently measures 4 mm on image number 28  of series 7. No new nodules are seen. No pleural fluid. There is a small amount of bilateral lower lobe atelectasis or scarring. Musculoskeletal: Thoracic spine degenerative changes. Review of the MIP images confirms the above findings. CT ABDOMEN and PELVIS FINDINGS Hepatobiliary: No focal liver abnormality is seen. No gallstones, gallbladder wall thickening, or biliary dilatation. Pancreas: 2 small calculi in the head of the pancreas. No pancreatic mass seen. Spleen: Multiple calcified splenic granulomata. Adrenals/Urinary Tract: Stable right renal cyst and small left renal cyst. Normal appearing urinary bladder and ureters. No calculi or hydronephrosis. Normal appearing adrenal glands. Stomach/Bowel: Multiple sigmoid colon diverticula. Unremarkable stomach and small bowel. Normal appendix. Vascular/Lymphatic: Again demonstrated is a large infrarenal abdominal aortic aneurysm, beginning at the level of the renal arteries and just below the origin of the superior mesenteric artery. This measures 6.4 cm in maximum diameter on image number 33 this previously measured 5.7 cm in corresponding diameter. Aneurysmal dilatation of both common iliac arteries is again demonstrated. The right common iliac artery measures 2.5 cm in maximum transverse diameter, previously 2.3 cm. The left common iliac artery measures 1.9 cm in maximum transverse diameter, previously 2.0 cm. Atheromatous arterial calcifications and plaques are demonstrated, including neural thrombosed in the abdominal aortic aneurysm. No enlarged lymph nodes. No retroperitoneal hemorrhage. Reproductive: Prostate is unremarkable. Other: Small left paraumbilical hernia containing fat and mesenteric vessels without significant change. Musculoskeletal: Lumbar and lower thoracic spine degenerative changes. Review of the MIP images confirms the above findings. IMPRESSION: 1. No pulmonary emboli or acute abnormality. 2. Interval increase in size of the infrarenal  abdominal aortic aneurysm, currently measuring 6.4 cm in maximum diameter. Vascular surgery consultation recommended due to increased risk of rupture for AAA >5.5 cm. This recommendation follows ACR consensus guidelines: White Paper of the ACR Incidental Findings Committee II on Vascular Findings. J Am Coll Radiol 2013; 10:789-794. 3. Mildly progressed aneurysmal dilatation of the right common iliac artery with a maximum transverse diameter 2.5 cm. 4. Stable aneurysmal dilatation of the left common iliac artery with a maximum transverse diameter 1.9 cm. 5. Coronary artery atherosclerosis and aortic atherosclerosis. 6. Stable and mildly improved left lung nodules, as described above. A follow-up chest CT is recommended in 6-12 months. 7. COPD with extensive centrilobular emphysema. 8. Small left paraumbilical hernia containing fat and mesenteric vessels. 9. Sigmoid diverticulosis. Electronically Signed   By: Claudie Revering M.D.   On: 07/30/2016 11:54      Subjective: Patient reports improved symptoms  Discharge Exam: Vitals:   08/05/16 0740 08/05/16 1006  BP: (!) 141/64 (!) 141/64  Pulse: (!) 57   Resp: 16   Temp:     Vitals:   08/05/16 0740 08/05/16 0921 08/05/16 0922 08/05/16 1006  BP: (!) 141/64   (!) 141/64  Pulse: (!) 57     Resp: 16     Temp:      TempSrc:      SpO2: 96% 100% 100%   Weight:  Height:        General exam: Appears calm and comfortable Respiratory system: Diffuse wheezing and prolonged expiratory phase. Improved. Respiratory effort normal. Cardiovascular system: S1 & S2 heard, RRR. No murmurs, rubs, gallops or clicks. Gastrointestinal system: Abdomen is nondistended, soft and nontender. Normal bowel sounds heard. Central nervous system: Alert and oriented. No focal neurological deficits. Extremities: No edema. No calf tenderness Skin: No cyanosis. No rashes Psychiatry: Judgement and insight appear normal. Mood & affect appropriate.   The results of significant  diagnostics from this hospitalization (including imaging, microbiology, ancillary and laboratory) are listed below for reference.     Microbiology: No results found for this or any previous visit (from the past 240 hour(s)).   Labs: BNP (last 3 results)  Recent Labs  12/31/15 0432 07/30/16 0933  BNP 158.3* 123456*   Basic Metabolic Panel:  Recent Labs Lab 08/01/16 0633 08/04/16 0245  NA 135 135  K 4.4 4.7  CL 97* 102  CO2 28 25  GLUCOSE 160* 143*  BUN 41* 42*  CREATININE 1.33* 1.16  CALCIUM 8.8* 8.8*  MG 2.5*  --    Liver Function Tests: No results for input(s): AST, ALT, ALKPHOS, BILITOT, PROT, ALBUMIN in the last 168 hours. No results for input(s): LIPASE, AMYLASE in the last 168 hours. No results for input(s): AMMONIA in the last 168 hours. CBC:  Recent Labs Lab 08/04/16 0245  WBC 11.0*  HGB 13.4  HCT 40.5  MCV 90.4  PLT 135*   Cardiac Enzymes: No results for input(s): CKTOTAL, CKMB, CKMBINDEX, TROPONINI in the last 168 hours. BNP: Invalid input(s): POCBNP CBG: No results for input(s): GLUCAP in the last 168 hours. D-Dimer No results for input(s): DDIMER in the last 72 hours. Hgb A1c No results for input(s): HGBA1C in the last 72 hours. Lipid Profile No results for input(s): CHOL, HDL, LDLCALC, TRIG, CHOLHDL, LDLDIRECT in the last 72 hours. Thyroid function studies No results for input(s): TSH, T4TOTAL, T3FREE, THYROIDAB in the last 72 hours.  Invalid input(s): FREET3 Anemia work up No results for input(s): VITAMINB12, FOLATE, FERRITIN, TIBC, IRON, RETICCTPCT in the last 72 hours. Urinalysis    Component Value Date/Time   COLORURINE YELLOW 04/17/2013 1650   APPEARANCEUR CLEAR 04/17/2013 1650   LABSPEC 1.018 04/17/2013 1650   PHURINE 5.0 04/17/2013 1650   GLUCOSEU NEGATIVE 04/17/2013 1650   HGBUR NEGATIVE 04/17/2013 1650   BILIRUBINUR NEGATIVE 04/17/2013 1650   KETONESUR NEGATIVE 04/17/2013 1650   PROTEINUR NEGATIVE 04/17/2013 1650    UROBILINOGEN 0.2 04/17/2013 1650   NITRITE NEGATIVE 04/17/2013 1650   LEUKOCYTESUR NEGATIVE 04/17/2013 1650   Sepsis Labs Invalid input(s): PROCALCITONIN,  WBC,  LACTICIDVEN Microbiology No results found for this or any previous visit (from the past 240 hour(s)).   Time coordinating discharge: Over 30 minutes  SIGNED:   Cordelia Poche, MD [facility] 08/07/2016, 6:41 PM Pager [phone]  If 7PM-7AM, please contact night-coverage [email or web address] Password [email or web address]

## 2016-08-05 NOTE — Progress Notes (Signed)
Pt d/c home, in no acute distress,  Oxygen at bedside, leaving on 2L via nasal canula, continuous.  Pt family waiting at main entrance.  All d/c papers and RX given to pt. D/C Instructions reviewed in detail with pt.  VSS.

## 2016-08-05 NOTE — Discharge Instructions (Addendum)
[name], you were treated for a COPD exacerbation. You have improved since your admission and you will continue treatment as an outpatient (doxycycline and prednisone taper). Please follow-up with your primary care physician and pulmonologist. We also evaluated your aneurysm. Please follow-up with your vascular surgeon for your routine follow-ups.  Sincerely,  Cordelia Poche, MD

## 2016-08-07 ENCOUNTER — Telehealth: Payer: Self-pay

## 2016-08-07 ENCOUNTER — Other Ambulatory Visit: Payer: Self-pay

## 2016-08-07 NOTE — Telephone Encounter (Signed)
LMTCB

## 2016-08-08 ENCOUNTER — Other Ambulatory Visit: Payer: Self-pay | Admitting: Pharmacist

## 2016-08-08 ENCOUNTER — Other Ambulatory Visit: Payer: Self-pay

## 2016-08-08 NOTE — Telephone Encounter (Signed)
Pt is returning sheena call

## 2016-08-08 NOTE — Patient Outreach (Signed)
[facility]) Care Management  08/08/2016  GORGE KEHR 07/18/1951 [id number]  Patient was referred to [place] by [facility] Liaison for medication assistance evaluation.    Outreach call to patient, his spouse, on [facility] Consent form answered call and stated that patient was not available at the time.  Left HIPAA compliant message with spouse to have patient call [facility] Pharmacist back.   Plan:  Will make another outreach attempt to patient in the next week.   Karrie Meres, PharmD, [place] [phone]

## 2016-08-08 NOTE — Telephone Encounter (Signed)
D/C 08/07/16 To: home  Pt is feeling much better since being home. States that he feels he is improving daily.  Pt scheduled to see Dr. Martinique 08/10/16.    Transition Care Management Follow-up Telephone Call  How have you been since you were released from the hospital? Good, improving daily   Do you understand why you were in the hospital? yes   Do you understand the discharge instrcutions? yes  Items Reviewed:  Medications reviewed: yes  Allergies reviewed: yes  Dietary changes reviewed: yes  Referrals reviewed: yes   Functional Questionnaire:   Activities of Daily Living (ADLs):   He states they are independent in the following: ambulation States they require assistance with the following: none   Any transportation issues/concerns?: no   Any patient concerns? yes   Confirmed importance and date/time of follow-up visits scheduled: yes   Confirmed with patient if condition begins to worsen call PCP or go to the ER.  Patient was given the Call-a-Nurse line [phone]: yes

## 2016-08-08 NOTE — Patient Outreach (Signed)
     Unsuccessful attempt made to make contact with patient via telephone for transition of care. HIPPA compliant message left with this RNCM's contact informations with request for return call.    Plan: Make another attempt to contact patient via telephone on tomorrow, November 8

## 2016-08-09 ENCOUNTER — Ambulatory Visit: Payer: Self-pay | Admitting: Pharmacist

## 2016-08-09 ENCOUNTER — Other Ambulatory Visit: Payer: Self-pay

## 2016-08-09 NOTE — Patient Outreach (Signed)
[facility]) Care Management  08/09/2016   ADONIRAM CICOTTE [birth year] [id number]  Subjective:  I dont have my rescue or maintenance inhaler  Objective:  Telephonic encounter.   Current Medications:  Current Outpatient Prescriptions  Medication Sig Dispense Refill  . ALPRAZolam (XANAX) 0.25 MG tablet TAKE ONE TABLET BY MOUTH THREE TIMES DAILY AS NEEDED FOR ANXIETY 90 tablet 2  . aspirin 325 MG tablet Take 325 mg by mouth daily.     Marland Kitchen atorvastatin (LIPITOR) 80 MG tablet Take 1 tablet (80 mg total) by mouth daily. PLEASE CONTACT OFFICE FOR ADDITIONAL REFILLS 90 tablet 0  . b complex vitamins tablet Take 1 tablet by mouth daily.     . bisoprolol (ZEBETA) 5 MG tablet TAKE 1 TABLET EVERY DAY 90 tablet 1  . Ferrous Sulfate (IRON) 325 (65 FE) MG TABS Take 1 tablet by mouth daily.    . furosemide (LASIX) 20 MG tablet TAKE 1 TABLET EVERY DAY 90 tablet 1  . glucose blood test strip Test once daily E11.9 100 each 12  . hydrALAZINE (APRESOLINE) 10 MG tablet TAKE 1 TABLET THREE TIMES DAILY 270 tablet 0  . isosorbide dinitrate (ISORDIL) 30 MG tablet TAKE 1/2 TABLET EVERY DAY 45 tablet 1  . KRILL OIL 1000 MG CAPS Take 1 capsule by mouth daily.    . Lancets (ACCU-CHEK SOFT TOUCH) lancets Test once daily Dx E11.9 100 each 12  . morphine (MSIR) 15 MG tablet Take 1 tablet (15 mg total) by mouth 4 (four) times daily. 20 tablet 0  . multivitamin (THERAGRAN) per tablet Take 1 tablet by mouth daily.     . predniSONE (DELTASONE) 10 MG tablet 40mg  X5 days, 30mg  X5 days, 20mg  X5 days, 10mg  X5 days. 50 tablet 0  . Respiratory Therapy Supplies (FLUTTER) DEVI Use as directed. 1 each 0  . roflumilast (DALIRESP) 500 MCG TABS tablet Take 1 tablet (500 mcg total) by mouth daily. 30 tablet 0  . traZODone (DESYREL) 50 MG tablet TAKE ONE-HALF TO ONE TABLET BY MOUTH AT BEDTIME AS NEEDED FOR SLEEP 90 tablet 0  . albuterol (PROVENTIL HFA;VENTOLIN HFA) 108 (90 Base) MCG/ACT inhaler Inhale 2 puffs into the  lungs every 6 (six) hours as needed for wheezing or shortness of breath. (Patient not taking: Reported on 08/09/2016) 1 Inhaler 6  . albuterol (PROVENTIL) (5 MG/ML) 0.5% nebulizer solution TAKE 0.5 ML(S) BY NEBULIZATION EVERY FOUR HOURS AS NEEDED (Patient not taking: Reported on 08/09/2016) 150 vial 3  . ipratropium-albuterol (DUONEB) 0.5-2.5 (3) MG/3ML SOLN Take 3 mLs by nebulization every 6 (six) hours. Use 4 times daily x 4 days, then every 6 hours as needed. (Patient not taking: Reported on 08/09/2016) 360 mL 3  . mometasone-formoterol (DULERA) 200-5 MCG/ACT AERO Inhale 2 puffs into the lungs 2 (two) times daily. (Patient not taking: Reported on 08/09/2016) 1 Inhaler 0  . roflumilast (DALIRESP) 500 MCG TABS tablet Take 1 tablet (500 mcg total) by mouth daily. 7 tablet 0   No current facility-administered medications for this visit.     Functional Status:  In your present state of health, do you have any difficulty performing the following activities: 08/09/2016 08/02/2016  Hearing? N N  Vision? N N  Difficulty concentrating or making decisions? N N  Walking or climbing stairs? N N  Dressing or bathing? N N  Doing errands, shopping? N N  Preparing Food and eating ? N -  Using the Toilet? N -  In the past six months, have  you accidently leaked urine? N -  Do you have problems with loss of bowel control? N -  Managing your Medications? N -  Managing your Finances? N -  Housekeeping or managing your Housekeeping? Y -  Some recent data might be hidden    Fall/Depression Screening: PHQ 2/9 Scores 08/09/2016 01/21/2016 09/01/2015 02/18/2014 03/15/2012 12/27/2011 12/25/2011  PHQ - 2 Score 0 0 0 0 1 0 0  PHQ- 9 Score 1 - - - - - -    Fall Risk  08/09/2016 01/21/2016 09/01/2015 02/18/2014  Falls in the past year? No No No No  Risk for fall due to : Impaired balance/gait;Impaired vision - - -   [facility] CM Care Plan Problem One   Flowsheet Row Most Recent Value  Care Plan Problem One  Patient recently  discharged from hospital  Role Documenting the Problem One  Care Management [place] for Problem One  Active  [facility] Long Term Goal (31-90 days)  Patient will have no acute care admissions in the next 31 days  [facility]N Long Term Goal Start Date  08/09/16  Interventions for Problem One Long Term Goal  Transition of care call made  [facility] CM Short Term Goal #1 (0-30 days)  In the next 28 days, patient will meet with [facility] community care coordinator to assess for resources needed for patient to remain in the home.   [facility] CM Short Term Goal #1 Start Date  08/09/16  Interventions for Short Term Goal #1  Initial telephone assessment made, scheduled home visit for later this month for assessment of additional needs    [facility] CM Care Plan Problem Two   Flowsheet Row Most Recent Value  Care Plan Problem Two  Patient states he does not have his maintenance nor rescue inhalers  Role Documenting the Problem Two  Care Management [place] for Problem Two  Active  [facility] CM Short Term Goal #1 (0-30 days)  In the next 28 days, patient will make contact with [facility] pharmacist and seek assistance in obtaining inhalers  [facility] CM Short Term Goal #1 Start Date  08/09/16  Interventions for Short Term Goal #2   Transition of care call completed, discovered discrepancy in disicharge medicatios ordered and medications patient has.       Assessment:  Patient needs assistance in obtaiining rescue and maintenance inhalers.   Notification of medication discrepancies sent to [facility]  Pharmacist. Patient has appointment with his primary care physican tomorrow, per his account, he plans to request samples if available.  Plan:   Home visit later this month

## 2016-08-10 ENCOUNTER — Encounter: Payer: Self-pay | Admitting: Family Medicine

## 2016-08-10 ENCOUNTER — Encounter: Payer: Self-pay | Admitting: Acute Care

## 2016-08-10 ENCOUNTER — Ambulatory Visit (INDEPENDENT_AMBULATORY_CARE_PROVIDER_SITE_OTHER): Payer: Commercial Managed Care - HMO | Admitting: Acute Care

## 2016-08-10 ENCOUNTER — Ambulatory Visit (INDEPENDENT_AMBULATORY_CARE_PROVIDER_SITE_OTHER): Payer: Commercial Managed Care - HMO | Admitting: Family Medicine

## 2016-08-10 VITALS — BP 124/76 | HR 62 | Temp 97.9°F | Resp 16 | Ht 67.0 in | Wt 164.2 lb

## 2016-08-10 DIAGNOSIS — M5417 Radiculopathy, lumbosacral region: Secondary | ICD-10-CM

## 2016-08-10 DIAGNOSIS — G894 Chronic pain syndrome: Secondary | ICD-10-CM | POA: Diagnosis not present

## 2016-08-10 DIAGNOSIS — I739 Peripheral vascular disease, unspecified: Secondary | ICD-10-CM

## 2016-08-10 DIAGNOSIS — J9601 Acute respiratory failure with hypoxia: Secondary | ICD-10-CM | POA: Diagnosis not present

## 2016-08-10 DIAGNOSIS — I714 Abdominal aortic aneurysm, without rupture, unspecified: Secondary | ICD-10-CM

## 2016-08-10 DIAGNOSIS — J441 Chronic obstructive pulmonary disease with (acute) exacerbation: Secondary | ICD-10-CM | POA: Diagnosis not present

## 2016-08-10 DIAGNOSIS — J4489 Other specified chronic obstructive pulmonary disease: Secondary | ICD-10-CM

## 2016-08-10 DIAGNOSIS — F411 Generalized anxiety disorder: Secondary | ICD-10-CM

## 2016-08-10 DIAGNOSIS — J449 Chronic obstructive pulmonary disease, unspecified: Secondary | ICD-10-CM

## 2016-08-10 DIAGNOSIS — I4891 Unspecified atrial fibrillation: Secondary | ICD-10-CM

## 2016-08-10 DIAGNOSIS — M5416 Radiculopathy, lumbar region: Secondary | ICD-10-CM

## 2016-08-10 MED ORDER — MORPHINE SULFATE 15 MG PO TABS: 15.0000 mg | ORAL_TABLET | Freq: Four times a day (QID) | ORAL | 0 refills | Status: DC

## 2016-08-10 MED ORDER — MOMETASONE FURO-FORMOTEROL FUM 200-5 MCG/ACT IN AERO: 2.0000 | INHALATION_SPRAY | Freq: Two times a day (BID) | RESPIRATORY_TRACT | 0 refills | Status: DC

## 2016-08-10 MED ORDER — IPRATROPIUM BROMIDE 0.02 % IN SOLN: 0.5000 mg | RESPIRATORY_TRACT | 2 refills | Status: DC | PRN

## 2016-08-10 NOTE — Assessment & Plan Note (Signed)
Recent admission for exacerbation Doing well at follow up. Remains deconditioned  Plan: Stop Duo Nebs Resume :Ipatroprium neb treatments every 4 hours as needed for shortness of breath or wheezing Continue Albutertol nebs every 4 hours as needed for shortness of breath or wheezing Continue the prednisone taper until gone. Continue Mucinex for thick secretions or chest congestion. Follow up appointment with Dr. Ashok Cordia 09/11/2016 as scheduled. Continue your Daliresp, Dulera,Neb treatments,  We will prescribe a short acting rescue inhaler after you check with your insurance to see which is best covered. Follow up CT chest without contrast 01/2017.( If ok with Dr. Ashok Cordia) Please contact office for sooner follow up if symptoms do not improve or worsen or seek emergency care

## 2016-08-10 NOTE — Assessment & Plan Note (Signed)
Continue wearing oxygen at 2L nocturnally and for exertion as needed.

## 2016-08-10 NOTE — Patient Instructions (Addendum)
A few things to remember from today's visit:   Essential hypertension  COPD with chronic bronchitis (HCC)  Lumbar back pain with radiculopathy affecting right lower extremity - Plan: morphine (MSIR) 15 MG tablet  Chronic pain disorder - Plan: morphine (MSIR) 15 MG tablet     Today's Vitals   08/10/16 1457  BP: 124/76  Pulse: 62  Temp: 97.9 F (36.6 C)  TempSrc: Oral  SpO2: 90%  Weight: 164 lb 4 oz (74.5 kg)  Height: 5\' 7"  (1.702 m)      Pain Medicine Instructions HOW CAN PAIN MEDICINE AFFECT ME? You were prescribed pain medicine. This medicine may:  Make you tired or sleepy.  Affect how well you can:  Drive  Do certain activities. Pain medicine may not make all of your pain go away. You should be comfortable enough to:  Move.  Breathe.  Take care of yourself. HOW OFTEN SHOULD I TAKE PAIN MEDICINE AND HOW MUCH SHOULD I TAKE?  Take pain medicine only as told by your doctor and only as needed for pain.  You do not need to take pain medicine if you are not having pain, unless your doctor tells you to do that.  You can take less than the prescribed dose if you find that less medicine helps your pain. WHAT SHOULD I AVOID WHILE I AM TAKING PAIN MEDICINE? Follow these instructions after you start taking pain medicine, while you are taking the medicine, and for 8 hours after you stop taking the medicine:  Do not drive.  Do not use machinery.  Do not use power tools.  Do not sign legal documents.  Do not drink alcohol.  Do not take sleeping pills.  Do not take care of children by yourself.  Do not do any activities that involve climbing or being in high places.  Do not go into any body of water unless there is an adult nearby who can watch and help you. This includes:  [place].  Rivers.  Oceans.  Spas.  Swimming pools. HOW CAN I KEEP OTHERS SAFE WHILE I AM TAKING PAIN MEDICINE?  Store your pain medicine as told by your doctor. Make sure that  you keep it where children and pets cannot reach it.  Do not share your pain medicine with anyone.  Do not save any leftover pills. If you have any leftover pain medicine, get rid of it or destroy it as told by your doctor. WHAT ELSE DO I NEED TO KNOW ABOUT TAKING PAIN MEDICINE?  Use a poop (stool) softener if you have trouble pooping (constipation) because of your pain medicine. Eating more fruits and vegetables also helps with constipation.  Write down the times when you take your pain medicine. Look at the times before you take your next dose of medicine.  If your pain is very bad, do not take more pills than told by your doctor. Call your doctor for help.  Your pain medicine might have acetaminophen in it. Do not take any other acetaminophen while you are taking this medicine. An overdose of acetaminophen can do very bad damage to your liver. If you are taking any medicines in addition to your pain medicine, check the active ingredients on those medicines to see if acetaminophen is listed. WHEN SHOULD I CALL MY DOCTOR?  Your medicine is not helping the pain.  You do either of these soon after you take the medicine:  Throw up (vomit).  Have watery poop (diarrhea).  You have new pain in  areas that did not hurt before.  You have an allergic reaction to your medicine. This may include:  Feeling itchy.  Swelling.  Feeling dizzy.  Getting a new rash. WHEN SHOULD I CALL 911 OR GO TO THE EMERGENCY ROOM?  You feel dizzy or you faint.  You feel very confused.  You throw up again and again.  Your skin or lips turn pale or bluish in color.  You are:  Short of breath.  Breathing much more slowly than usual.  You have a very bad allergic reaction to your medicine. This includes:  Developing a swollen tongue.  Having trouble breathing.   This information is not intended to replace advice given to you by your health care provider. Make sure you discuss any questions you  have with your health care provider.   Document Released: 03/06/2008 Document Revised: 02/02/2015 Document Reviewed: 07/23/2014 Elsevier Interactive Patient Education [employer]. Please be sure medication list is accurate. If a new problem present, please set up appointment sooner than planned today.

## 2016-08-10 NOTE — Progress Notes (Signed)
Pre visit review using our clinic review tool, if applicable. No additional management support is needed unless otherwise documented below in the visit note. 
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
## 2016-08-10 NOTE — Patient Instructions (Addendum)
It is good to see you today. I am glad you are feeling better. Stop Duo Nebs Resume :Ipatroprium neb treatments every 4 hours as needed for shortness of breath or wheezing Continue Albutertol nebs every 4 hours as needed for shortness of breath or wheezing Continue the prednisone taper until gone. Continue Mucinex for thick secretions or chest congestion. Follow up appointment with Dr. Ashok Cordia 09/11/2016 as scheduled. Continue your Daliresp, Dulera,Neb treatments,  We will prescribe a short acting rescue inhaler after you check with your insurance to see which is best covered. Follow up CT check without contrast 01/2017. Please contact office for sooner follow up if symptoms do not improve or worsen or seek emergency care
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
## 2016-08-11 ENCOUNTER — Other Ambulatory Visit: Payer: Self-pay | Admitting: Pharmacist

## 2016-08-11 NOTE — Patient Outreach (Signed)
[place] [facility]) Care Management  08/11/2016  BRY CAMPOVERDE May 31, 1951 [id number]  Second attempt to reach patient.  Answering machine picked up and while leaving message a male answered call.  Asked if patient was available and she states he was out running errands.  Did not exchange PHI.    Left HIPAA compliant message for patient to call [facility] Pharmacist back.    Plan:  Will make outreach attempt to patient again next week.    Karrie Meres, PharmD, [place] [phone]

## 2016-08-14 ENCOUNTER — Other Ambulatory Visit: Payer: Self-pay | Admitting: Pharmacist

## 2016-08-14 NOTE — Patient Outreach (Signed)
[facility] [facility]) Care Management  08/14/2016  AKRAM MOBBS [birth year] [id number]  Successful phone outreach to patient this morning.  He verified HIPAA details and requested a phone call back around 1230.   Placed phone call back to patient this afternoon, he verified HIPAA.  Explained purpose of call.  Patient states he has samples of Dulera.  He wondered why it was expensive----per review of [place] preferred drug [place], appears Ruthe Mannan is non-formulary for [facility].    Discussed SSA Extra Help with patient---he reports household income exceeds requirements.   Discussed [place] Patient Assistance (albuterol inhaler and ICS/LABA combo product such as Advair or Breo)---income requirements, and out-of-pocket prescription spend.   He reports he would be interested in applying.  Patient was made aware, if found eligible by [place], eligibility would last through 10/01/16 only.    He states he obtained ipratropium and albuterol nebs as individual prescriptions from [facility] which provided him cost savings over the combination product.    Plan:  Will in-basket patient's pulmonologist regarding formulary alternatives to [facility] and products covered by [place] patient assistance.   Home visit scheduled with patient to complete [place] patient assistance paperwork---he agrees to obtain financial and prescription spend documents.    Karrie Meres, PharmD, [place] [phone]
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
## 2016-08-14 NOTE — Progress Notes (Signed)
Note reviewed.  Sonia Baller Ashok Cordia, M.D. [facility] Pager:  [phone] After 3pm or if no response, call [phone] 6:26 PM 08/14/16

## 2016-08-16 ENCOUNTER — Other Ambulatory Visit: Payer: Self-pay

## 2016-08-16 DIAGNOSIS — J449 Chronic obstructive pulmonary disease, unspecified: Secondary | ICD-10-CM | POA: Diagnosis not present

## 2016-08-17 ENCOUNTER — Other Ambulatory Visit: Payer: Self-pay | Admitting: Pharmacist

## 2016-08-17 NOTE — Patient Outreach (Signed)
[facility] [facility]) Care Management   08/16/2016   [name] 1951/04/26 [id number]  [name] is an 65 y.o. male  Subjective:  I know what my COPD action plan is. I have just one of my inhalers. I have had COPD a while.  Objective:   ROS    Physical Exam  ROS  Encounter Medications:   Outpatient Encounter Prescriptions as of 08/16/2016  Medication Sig  . ALPRAZolam (XANAX) 0.25 MG tablet TAKE ONE TABLET BY MOUTH THREE TIMES DAILY AS NEEDED FOR ANXIETY  . aspirin 325 MG tablet Take 325 mg by mouth daily.   Marland Kitchen atorvastatin (LIPITOR) 80 MG tablet Take 1 tablet (80 mg total) by mouth daily. PLEASE CONTACT OFFICE FOR ADDITIONAL REFILLS  . b complex vitamins tablet Take 1 tablet by mouth daily.   . bisoprolol (ZEBETA) 5 MG tablet TAKE 1 TABLET EVERY DAY  . Ferrous Sulfate (IRON) 325 (65 FE) MG TABS Take 1 tablet by mouth daily.  . furosemide (LASIX) 20 MG tablet TAKE 1 TABLET EVERY DAY  . glucose blood test strip Test once daily E11.9  . hydrALAZINE (APRESOLINE) 10 MG tablet TAKE 1 TABLET THREE TIMES DAILY  . isosorbide dinitrate (ISORDIL) 30 MG tablet TAKE 1/2 TABLET EVERY DAY  . KRILL OIL 1000 MG CAPS Take 1 capsule by mouth daily.  . Lancets (ACCU-CHEK SOFT TOUCH) lancets Test once daily Dx E11.9  . morphine (MSIR) 15 MG tablet Take 1 tablet (15 mg total) by mouth 4 (four) times daily.  . multivitamin (THERAGRAN) per tablet Take 1 tablet by mouth daily.   . predniSONE (DELTASONE) 10 MG tablet 98m X5 days, 365mX5 days, 2068m5 days, 68m49m days.  . ReMarland Kitchenpiratory Therapy Supplies (FLUTTER) DEVI Use as directed.  . roflumilast (DALIRESP) 500 MCG TABS tablet Take 1 tablet (500 mcg total) by mouth daily.  . traZODone (DESYREL) 50 MG tablet TAKE ONE-HALF TO ONE TABLET BY MOUTH AT BEDTIME AS NEEDED FOR SLEEP  . albuterol (PROVENTIL HFA;VENTOLIN HFA) 108 (90 Base) MCG/ACT inhaler Inhale 2 puffs into the lungs every 6 (six) hours as needed for wheezing or shortness  of breath.  . alMarland Kitchenuterol (PROVENTIL) (5 MG/ML) 0.5% nebulizer solution TAKE 0.5 ML(S) BY NEBULIZATION EVERY FOUR HOURS AS NEEDED  . ipratropium (ATROVENT) 0.02 % nebulizer solution Take 2.5 mLs (0.5 mg total) by nebulization every 4 (four) hours as needed for wheezing or shortness of breath.  . ipMarland Kitchenatropium-albuterol (DUONEB) 0.5-2.5 (3) MG/3ML SOLN Take 3 mLs by nebulization every 6 (six) hours. Use 4 times daily x 4 days, then every 6 hours as needed.  . mometasone-formoterol (DULERA) 200-5 MCG/ACT AERO Inhale 2 puffs into the lungs 2 (two) times daily.  . mometasone-formoterol (DULERA) 200-5 MCG/ACT AERO Inhale 2 puffs into the lungs 2 (two) times daily.  . roflumilast (DALIRESP) 500 MCG TABS tablet Take 1 tablet (500 mcg total) by mouth daily.   No facility-administered encounter medications on file as of 08/16/2016.     Functional Status:   In your present state of health, do you have any difficulty performing the following activities: 08/09/2016 08/02/2016  Hearing? N N  Vision? N N  Difficulty concentrating or making decisions? N N  Walking or climbing stairs? N N  Dressing or bathing? N N  Doing errands, shopping? N N  Preparing Food and eating ? N -  Using the Toilet? N -  In the past six months, have you accidently leaked urine? N -  Do you  have problems with loss of bowel control? N -  Managing your Medications? N -  Managing your Finances? N -  Housekeeping or managing your Housekeeping? Y -  Some recent data might be hidden    Fall/Depression Screening:    PHQ 2/9 Scores 08/09/2016 01/21/2016 09/01/2015 02/18/2014 03/15/2012 12/27/2011 12/25/2011  PHQ - 2 Score 0 0 0 0 1 0 0  PHQ- 9 Score 1 - - - - - -   THN CM Care Plan Problem One   Flowsheet Row Most Recent Value  Care Plan Problem One  Patient recently discharged from hospital  Role Documenting the Problem One  Care Management [place] for Problem One  Active  THN Long Term Goal (31-90 days)  Patient will  have no acute care admissions in the next 31 days  THN Long Term Goal Start Date  08/09/16  Interventions for Problem One Long Term Goal  Transition of care call made  [facility] CM Short Term Goal #1 (0-30 days)  In the next 28 days, patient will meet with [facility] community care coordinator to assess for resources needed for patient to remain in the home.   THN CM Short Term Goal #1 Start Date  08/09/16  Interventions for Short Term Goal #1  iniitial home viist to assess need for community care coordination    [facility] CM Care Plan Problem Two   Flowsheet Row Most Recent Value  Care Plan Problem Two  Patient states he does not have his maintenance nor rescue inhalers  Role Documenting the Problem Two  Care Management [place] for Problem Two  Active  THN CM Short Term Goal #1 (0-30 days)  In the next 28 days, patient will make contact with [facility] pharmacist and seek assistance in obtaining inhalers  THN CM Short Term Goal #1 Start Date  08/09/16  [facility] CM Short Term Goal #1 Met Date   08/16/16  Interventions for Short Term Goal #2   Pharmacy consult entered, [facility] pharmacy plans home visit 11/16     [facility] CM Care Plan Problem Three   Flowsheet Row Most Recent Value  Care Plan Problem Three  knowledge deficit related to COPD  Role Documenting the Problem Three  Care Management Coordinator  Care Plan for Problem Three  Active  THN CM Short Term Goal #1 (0-30 days)  In the next 21 days, patient will view EMMI COPD video material  THN CM Short Term Goal #1 Start Date  08/17/16  Interventions for Short Term Goal #1  Inital home visit for assessment of COPD Educational needs.     Assessment:    p Plan:

## 2016-08-17 NOTE — Patient Outreach (Signed)
[facility]) Care Management  [place]   08/17/2016  Jason STRYCKER Apr 09, 1951 [id number]  Subjective:  Patient was referred to [place] for medication patient assistance evaluation.  He was admitted 10/29-11/4/17 with shortness of breath.    Patient was referred to [place] by [facility] Liaison [place].    Home visit completed with patient on 08/17/16 to discuss patient assistance options and review medications.    Patient reports he is not eligible for SSA Extra Help.   He denied any questions regarding his medications today.    Objective:   Encounter Medications: Outpatient Encounter Prescriptions as of 08/17/2016  Medication Sig  . albuterol (PROVENTIL HFA;VENTOLIN HFA) 108 (90 Base) MCG/ACT inhaler Inhale 2 puffs into the lungs every 6 (six) hours as needed for wheezing or shortness of breath.  Marland Kitchen albuterol (PROVENTIL) (5 MG/ML) 0.5% nebulizer solution TAKE 0.5 ML(S) BY NEBULIZATION EVERY FOUR HOURS AS NEEDED  . ALPRAZolam (XANAX) 0.25 MG tablet TAKE ONE TABLET BY MOUTH THREE TIMES DAILY AS NEEDED FOR ANXIETY  . aspirin 325 MG tablet Take 325 mg by mouth daily.   Marland Kitchen atorvastatin (LIPITOR) 80 MG tablet Take 1 tablet (80 mg total) by mouth daily. PLEASE CONTACT OFFICE FOR ADDITIONAL REFILLS  . b complex vitamins tablet Take 1 tablet by mouth daily.   . bisoprolol (ZEBETA) 5 MG tablet TAKE 1 TABLET EVERY DAY  . Ferrous Sulfate (IRON) 325 (65 FE) MG TABS Take 1 tablet by mouth daily.  . furosemide (LASIX) 20 MG tablet TAKE 1 TABLET EVERY DAY  . glucose blood test strip Test once daily E11.9  . hydrALAZINE (APRESOLINE) 10 MG tablet TAKE 1 TABLET THREE TIMES DAILY  . ipratropium (ATROVENT) 0.02 % nebulizer solution Take 2.5 mLs (0.5 mg total) by nebulization every 4 (four) hours as needed for wheezing or shortness of breath.  Marland Kitchen ipratropium-albuterol (DUONEB) 0.5-2.5 (3) MG/3ML SOLN Take 3 mLs by nebulization every 6 (six) hours. Use 4 times  daily x 4 days, then every 6 hours as needed.  . isosorbide dinitrate (ISORDIL) 30 MG tablet TAKE 1/2 TABLET EVERY DAY  . KRILL OIL 1000 MG CAPS Take 1 capsule by mouth daily.  . Lancets (ACCU-CHEK SOFT TOUCH) lancets Test once daily Dx E11.9  . mometasone-formoterol (DULERA) 200-5 MCG/ACT AERO Inhale 2 puffs into the lungs 2 (two) times daily.  . mometasone-formoterol (DULERA) 200-5 MCG/ACT AERO Inhale 2 puffs into the lungs 2 (two) times daily.  Marland Kitchen morphine (MSIR) 15 MG tablet Take 1 tablet (15 mg total) by mouth 4 (four) times daily.  . multivitamin (THERAGRAN) per tablet Take 1 tablet by mouth daily.   . predniSONE (DELTASONE) 10 MG tablet 40mg  X5 days, 30mg  X5 days, 20mg  X5 days, 10mg  X5 days.  Marland Kitchen Respiratory Therapy Supplies (FLUTTER) DEVI Use as directed.  . roflumilast (DALIRESP) 500 MCG TABS tablet Take 1 tablet (500 mcg total) by mouth daily.  . roflumilast (DALIRESP) 500 MCG TABS tablet Take 1 tablet (500 mcg total) by mouth daily.  . traZODone (DESYREL) 50 MG tablet TAKE ONE-HALF TO ONE TABLET BY MOUTH AT BEDTIME AS NEEDED FOR SLEEP   No facility-administered encounter medications on file as of 08/17/2016.     Functional Status: In your present state of health, do you have any difficulty performing the following activities: 08/09/2016 08/02/2016  Hearing? N N  Vision? N N  Difficulty concentrating or making decisions? N N  Walking or climbing stairs? N N  Dressing  or bathing? N N  Doing errands, shopping? N N  Preparing Food and eating ? N -  Using the Toilet? N -  In the past six months, have you accidently leaked urine? N -  Do you have problems with loss of bowel control? N -  Managing your Medications? N -  Managing your Finances? N -  Housekeeping or managing your Housekeeping? Y -  Some recent data might be hidden    Fall/Depression Screening: PHQ 2/9 Scores 08/09/2016 01/21/2016 09/01/2015 02/18/2014 03/15/2012 12/27/2011 12/25/2011  PHQ - 2 Score 0 0 0 0 1 0 0  PHQ- 9  Score 1 - - - - - -    Assessment:  Medications reviewed per patient report, he did not wish to show [facility] Pharmacist his prescription bottles.    Drugs sorted by system:  Neurologic/Psychologic: -alprazolam  Cardiovascular: -aspirin  -atorvastatin -bisoprolol -furosemide -hydralazine -isosorbide dinitrate  Pulmonary/Allergy: -albuterol inhaler (patient reports he needs unable to afford) -albuterol nebs -ipratropium nebs -ipratropium/albuterol nebs (on medication list patient reports not using secondary to cost and using individual components)  -Dulera (patient reports has samples)  -Daliresp   Pain: -morphine sulfate IR---dose was increased following hospital discharge  Vitamins/Minerals: -b complex vitamin  -ferrous sulfate -krill oil  -multivitamin   Miscellaneous: -trazodone  -prednisone taper  Other: Per hospital discharge summary: amitriptyline was discontinued and Symbicort changed to [facility].    Medication Assistance:  Ruthe Mannan appears to be non-formulary on his MA-PDP, preferred Tier 3 ICS/LABA agents include:  Advair, Breo, Symbicort.   Patient is in the coverage gap and requests manufacturer patient assistance evaluation.    [employer] makes albuterol inhaler as well as Advair and Breo.  Patient can apply to [place] for an albuterol inhaler (Ventolin) and if MD finds it appropriate either Advair or Breo in place of [place].    Patient can apply to [employer] patient assistance program for [employer] in place of [place], if MD finds it appropriate.    Patient understands that these applications will require him to submit proof of income and out-of-pocket prescription spend, which he supplied.  He understands there is no guarantee a patient assistance program will approve him, and if he is approved his eligibility may end 10/01/16.    Plan:  Will route this note to patient's PCP and pulmonologist.    Will fax patient's pulmonologist regarding patient  assistance applications---once prescriber portion is received, will fax applications to respective manufacturers.    Will continue to follow-up with patient on patient assistance applications.    Karrie Meres, PharmD, [place] [phone]

## 2016-08-18 ENCOUNTER — Encounter: Payer: Self-pay | Admitting: Cardiology

## 2016-08-18 ENCOUNTER — Ambulatory Visit (INDEPENDENT_AMBULATORY_CARE_PROVIDER_SITE_OTHER): Payer: Commercial Managed Care - HMO | Admitting: Cardiology

## 2016-08-18 VITALS — BP 122/58 | HR 72 | Ht 67.0 in | Wt 164.2 lb

## 2016-08-18 DIAGNOSIS — I714 Abdominal aortic aneurysm, without rupture, unspecified: Secondary | ICD-10-CM

## 2016-08-18 DIAGNOSIS — I4891 Unspecified atrial fibrillation: Secondary | ICD-10-CM

## 2016-08-18 DIAGNOSIS — I2510 Atherosclerotic heart disease of native coronary artery without angina pectoris: Secondary | ICD-10-CM

## 2016-08-18 DIAGNOSIS — E7800 Pure hypercholesterolemia, unspecified: Secondary | ICD-10-CM

## 2016-08-18 DIAGNOSIS — I255 Ischemic cardiomyopathy: Secondary | ICD-10-CM

## 2016-08-18 DIAGNOSIS — I10 Essential (primary) hypertension: Secondary | ICD-10-CM | POA: Diagnosis not present

## 2016-08-18 DIAGNOSIS — I679 Cerebrovascular disease, unspecified: Secondary | ICD-10-CM

## 2016-08-18 LAB — TSH: TSH: 0.59 m[IU]/L (ref 0.40–4.50)

## 2016-08-18 MED ORDER — ATORVASTATIN CALCIUM 80 MG PO TABS: 80.0000 mg | ORAL_TABLET | Freq: Every day | ORAL | 3 refills | Status: AC

## 2016-08-18 NOTE — Patient Instructions (Signed)
Medication Instructions:   NO CHANGE- REFILL SENT TO THE PHARMACY  Your physician recommends that you HAVE LAB WORK TODAY  Testing/Procedures:  Your physician has requested that you have an echocardiogram. Echocardiography is a painless test that uses sound waves to create images of your heart. It provides your doctor with information about the size and shape of your heart and how well your heart's chambers and valves are working. This procedure takes approximately one hour. There are no restrictions for this procedure.    Follow-Up:  Your physician wants you to follow-up in: [name] will receive a reminder letter in the mail two months in advance. If you don't receive a letter, please call our office to schedule the follow-up appointment.   If you need a refill on your cardiac medications before your next appointment, please call your pharmacy.

## 2016-08-21 ENCOUNTER — Other Ambulatory Visit: Payer: Self-pay | Admitting: Acute Care

## 2016-08-21 DIAGNOSIS — Z87891 Personal history of nicotine dependence: Secondary | ICD-10-CM

## 2016-08-22 ENCOUNTER — Other Ambulatory Visit: Payer: Self-pay

## 2016-08-22 NOTE — Patient Outreach (Signed)
[facility] [facility]) Care Management  08/22/2016   [name] 06-Sep-1951 [id number]  Subjective:  I am doing okay. I am still not able to afford the inhalers  Objective:  telpehonic contact  Current Medications:  Current Outpatient Prescriptions  Medication Sig Dispense Refill  . albuterol (PROVENTIL HFA;VENTOLIN HFA) 108 (90 Base) MCG/ACT inhaler Inhale 2 puffs into the lungs every 6 (six) hours as needed for wheezing or shortness of breath. (Patient not taking: Reported on 08/18/2016) 1 Inhaler 6  . albuterol (PROVENTIL) (5 MG/ML) 0.5% nebulizer solution TAKE 0.5 ML(S) BY NEBULIZATION EVERY FOUR HOURS AS NEEDED 150 vial 3  . ALPRAZolam (XANAX) 0.25 MG tablet TAKE ONE TABLET BY MOUTH THREE TIMES DAILY AS NEEDED FOR ANXIETY 90 tablet 2  . aspirin 325 MG tablet Take 325 mg by mouth daily.     Marland Kitchen atorvastatin (LIPITOR) 80 MG tablet Take 1 tablet (80 mg total) by mouth daily. 90 tablet 3  . b complex vitamins tablet Take 1 tablet by mouth daily.     . bisoprolol (ZEBETA) 5 MG tablet TAKE 1 TABLET EVERY DAY 90 tablet 1  . Ferrous Sulfate (IRON) 325 (65 FE) MG TABS Take 1 tablet by mouth daily.    . furosemide (LASIX) 20 MG tablet TAKE 1 TABLET EVERY DAY 90 tablet 1  . glucose blood test strip Test once daily E11.9 100 each 12  . hydrALAZINE (APRESOLINE) 10 MG tablet TAKE 1 TABLET THREE TIMES DAILY 270 tablet 0  . ipratropium (ATROVENT) 0.02 % nebulizer solution Take 2.5 mLs (0.5 mg total) by nebulization every 4 (four) hours as needed for wheezing or shortness of breath. 75 mL 2  . ipratropium-albuterol (DUONEB) 0.5-2.5 (3) MG/3ML SOLN Take 3 mLs by nebulization every 6 (six) hours. Use 4 times daily x 4 days, then every 6 hours as needed. (Patient not taking: Reported on 08/18/2016) 360 mL 3  . isosorbide dinitrate (ISORDIL) 30 MG tablet TAKE 1/2 TABLET EVERY DAY 45 tablet 1  . KRILL OIL 1000 MG CAPS Take 1 capsule by mouth daily.    . Lancets (ACCU-CHEK SOFT TOUCH) lancets  Test once daily Dx E11.9 100 each 12  . mometasone-formoterol (DULERA) 200-5 MCG/ACT AERO Inhale 2 puffs into the lungs 2 (two) times daily. 1 Inhaler 0  . mometasone-formoterol (DULERA) 200-5 MCG/ACT AERO Inhale 2 puffs into the lungs 2 (two) times daily. 1 Inhaler 0  . morphine (MSIR) 15 MG tablet Take 1 tablet (15 mg total) by mouth 4 (four) times daily. 120 tablet 0  . multivitamin (THERAGRAN) per tablet Take 1 tablet by mouth daily.     . predniSONE (DELTASONE) 10 MG tablet 89m X5 days, 366mX5 days, 2048m5 days, 89m40m days. 50 tablet 0  . Respiratory Therapy Supplies (FLUTTER) DEVI Use as directed. 1 each 0  . roflumilast (DALIRESP) 500 MCG TABS tablet Take 1 tablet (500 mcg total) by mouth daily. 30 tablet 0  . traZODone (DESYREL) 50 MG tablet TAKE ONE-HALF TO ONE TABLET BY MOUTH AT BEDTIME AS NEEDED FOR SLEEP 90 tablet 0   No current facility-administered medications for this visit.     Functional Status:  In your present state of health, do you have any difficulty performing the following activities: 08/09/2016 08/02/2016  Hearing? N N  Vision? N N  Difficulty concentrating or making decisions? N N  Walking or climbing stairs? N N  Dressing or bathing? N N  Doing errands, shopping? N N  PrepConservation officer, nature  eating ? N -  Using the Toilet? N -  In the past six months, have you accidently leaked urine? N -  Do you have problems with loss of bowel control? N -  Managing your Medications? N -  Managing your Finances? N -  Housekeeping or managing your Housekeeping? Y -  Some recent data might be hidden    Fall/Depression Screening: PHQ 2/9 Scores 08/09/2016 01/21/2016 09/01/2015 02/18/2014 03/15/2012 12/27/2011 12/25/2011  PHQ - 2 Score 0 0 0 0 1 0 0  PHQ- 9 Score 1 - - - - - -   THN CM Care Plan Problem One   Flowsheet Row Most Recent Value  Care Plan Problem One  Patient recently discharged from hospital  Role Documenting the Problem One  Care Management [place] for Problem One  Active  THN Long Term Goal (31-90 days)  Patient will have no acute care admissions in the next 31 days  THN Long Term Goal Start Date  08/09/16  Interventions for Problem One Long Term Goal  telephone contact to assess patient's progress towards meeting her case management goals.  Patient states he has not had any acute care admissions since being discharged early this month  THN CM Short Term Goal #1 (0-30 days)  In the next 28 days, patient will meet with [facility] community care coordinator to assess for resources needed for patient to remain in the home.   THN CM Short Term Goal #1 Start Date  08/09/16  THN CM Short Term Goal #1 Met Date  08/16/16  Interventions for Short Term Goal #1  iniitial home viist to assess need for community care coordination    [facility] CM Care Plan Problem Two   Flowsheet Row Most Recent Value  Care Plan Problem Two  Patient states he does not have his maintenance nor rescue inhalers  Role Documenting the Problem Two  Care Management [place] for Problem Two  Active  THN CM Short Term Goal #1 (0-30 days)  In the next 28 days, patient will make contact with [facility] pharmacist and seek assistance in obtaining inhalers  THN CM Short Term Goal #1 Start Date  08/09/16  [facility] CM Short Term Goal #1 Met Date   08/16/16  Interventions for Short Term Goal #2   Pharmacy consult entered, [facility] pharmacy plans home visit 11/16     [facility] CM Care Plan Problem Three   Flowsheet Row Most Recent Value  Care Plan Problem Three  knowledge deficit related to COPD  Role Documenting the Problem Three  Care Management Coordinator  Care Plan for Problem Three  Active  THN CM Short Term Goal #1 (0-30 days)  In the next 21 days, patient will view EMMI COPD video material  THN CM Short Term Goal #1 Start Date  08/17/16  [facility] CM Short Term Goal #1 Met Date  08/16/16  Interventions for Short Term Goal #1  Inital home visit for assessment of COPD Educational needs.      Assessment:  Patient has met with [facility] Pharmacist to explore community resources for assistance with obtaining his inhalers. Patient admits to feeling exertional with breathing more often now than last weeks visit. Pateint advised to seek emergent medical attention if his respirations become more difficult Patient states he received samples from his pulmonologist, he is currently using.    Plan:   Telephone contact with patient form community care coordination.

## 2016-08-30 ENCOUNTER — Ambulatory Visit (INDEPENDENT_AMBULATORY_CARE_PROVIDER_SITE_OTHER)
Admission: RE | Admit: 2016-08-30 | Discharge: 2016-08-30 | Disposition: A | Discharge status: Home / Self Care | Payer: Commercial Managed Care - HMO | Source: Ambulatory Visit | Attending: Pulmonary Disease | Admitting: Pulmonary Disease

## 2016-08-30 DIAGNOSIS — R918 Other nonspecific abnormal finding of lung field: Secondary | ICD-10-CM | POA: Diagnosis not present

## 2016-09-01 ENCOUNTER — Other Ambulatory Visit: Payer: Self-pay

## 2016-09-01 NOTE — Patient Outreach (Addendum)
   Unsuccessful attempt made to contact patient via telephone for community care coordination. Unable to leave HIPPA compliant message as no recording device did not pick up.  Plan: Attempt to make contact by telephone later in December 2017

## 2016-09-04 ENCOUNTER — Other Ambulatory Visit: Payer: Self-pay | Admitting: Pharmacist

## 2016-09-04 NOTE — Patient Outreach (Signed)
[facility]) Care Management  09/04/2016  [name] 02-Aug-1951 [id number]  Follow-up call placed to patient regarding [employer] patient assistance application.  Patient verified HIPAA details and reports he has not heard anything from [employer]cription [employer] Program.  Offered to contact patient assistance program and follow back up with patient and he agreed to this.   Called [employer]cription [employer] Program and representative reports application is missing copy of patient's medicare Part D card, this was faxed to patient assistance program to complete his application.   Phone call back to patient, HIPAA again verified by patient, to update him on the above status of his [employer] Prescriptions Savings Program application.   He verbalized understanding and denied other questions/concerns related to pharmacy needs.    Plan:  Will continue to follow-up patient assistance application with patient.   Karrie Meres, PharmD, [place] [phone]

## 2016-09-05 ENCOUNTER — Telehealth: Payer: Self-pay | Admitting: Pulmonary Disease

## 2016-09-05 ENCOUNTER — Other Ambulatory Visit: Payer: Self-pay | Admitting: Pharmacist

## 2016-09-05 NOTE — Patient Outreach (Signed)
[place] [facility]) Care Management  09/05/2016  SHRI ENFINGER Feb 06, 1951 [id number]  Follow-up call placed to [employer]cription [employer] Program and representative reports patient was approved through 10/01/16 for Symbicort and Daliresp.  Representative reports medication should be received by patient in the next 7-10 business days from [facility], the pharmacy [employer]cription Savings Program uses.   Successful phone outreach to patient, HIPAA verified.  Updated patient on the above regarding his patient assistance application.  Counseled patient to call [place] when he receives his medication---he agrees.   He denies other pharmacy related questions/concerns today.    Plan:  Will place follow-up call to patient next week to see if he received his patient assistance.   Karrie Meres, PharmD, [place] [phone]

## 2016-09-05 NOTE — Telephone Encounter (Signed)
Notes Recorded by Javier Glazier, MD on 09/03/2016 at 5:09 AM EST Please let the patient know that the nodules in his left lung have not changed in size. We will review this further at his follow-up appointment. Thank you.       Called and spoke with pt and he is aware of results per JN.  Nothing further is needed.

## 2016-09-05 NOTE — Progress Notes (Signed)
[id number]

## 2016-09-06 ENCOUNTER — Telehealth: Payer: Self-pay | Admitting: Pulmonary Disease

## 2016-09-06 NOTE — Telephone Encounter (Signed)
Javier Glazier, MD sent to Len Blalock, CMA        Please let the patient know that the nodules in his left lung have not changed in size. We will review this further at his follow-up appointment. Thank you   Spoke with pt and notified of results per Dr. Ashok Cordia. Pt verbalized understanding and denied any questions.

## 2016-09-06 NOTE — Progress Notes (Signed)
Pt notified of results

## 2016-09-11 ENCOUNTER — Ambulatory Visit (INDEPENDENT_AMBULATORY_CARE_PROVIDER_SITE_OTHER): Payer: Commercial Managed Care - HMO | Admitting: Pulmonary Disease

## 2016-09-11 ENCOUNTER — Encounter: Payer: Self-pay | Admitting: Pulmonary Disease

## 2016-09-11 VITALS — BP 134/68 | HR 87 | Ht 67.0 in | Wt 167.0 lb

## 2016-09-11 DIAGNOSIS — R918 Other nonspecific abnormal finding of lung field: Secondary | ICD-10-CM

## 2016-09-11 DIAGNOSIS — J449 Chronic obstructive pulmonary disease, unspecified: Secondary | ICD-10-CM

## 2016-09-11 DIAGNOSIS — G4734 Idiopathic sleep related nonobstructive alveolar hypoventilation: Secondary | ICD-10-CM

## 2016-09-11 MED ORDER — ROFLUMILAST 500 MCG PO TABS: 500.0000 ug | ORAL_TABLET | Freq: Every day | ORAL | 0 refills | Status: DC

## 2016-09-11 NOTE — Patient Instructions (Signed)
   Call me if you have any new breathing problems before your next appointment.  Remember to drop off your CT scan from [facility] for me to review.  I will see you back in 4 months or sooner if needed.
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
## 2016-09-13 ENCOUNTER — Telehealth: Payer: Self-pay | Admitting: Family Medicine

## 2016-09-13 DIAGNOSIS — G894 Chronic pain syndrome: Secondary | ICD-10-CM

## 2016-09-13 DIAGNOSIS — M5416 Radiculopathy, lumbar region: Secondary | ICD-10-CM

## 2016-09-13 NOTE — Telephone Encounter (Signed)
Okay to refill? 

## 2016-09-13 NOTE — Telephone Encounter (Signed)
Pt request refill  morphine (MSIR) 15 MG tablet

## 2016-09-14 ENCOUNTER — Ambulatory Visit (HOSPITAL_COMMUNITY): Payer: Commercial Managed Care - HMO | Attending: Cardiology

## 2016-09-14 ENCOUNTER — Other Ambulatory Visit: Payer: Self-pay

## 2016-09-14 DIAGNOSIS — J449 Chronic obstructive pulmonary disease, unspecified: Secondary | ICD-10-CM | POA: Diagnosis not present

## 2016-09-14 DIAGNOSIS — I10 Essential (primary) hypertension: Secondary | ICD-10-CM | POA: Insufficient documentation

## 2016-09-14 DIAGNOSIS — I4891 Unspecified atrial fibrillation: Secondary | ICD-10-CM | POA: Diagnosis not present

## 2016-09-14 NOTE — Telephone Encounter (Signed)
He doesn't see [place] until next week

## 2016-09-14 NOTE — Telephone Encounter (Signed)
Pt calling to check the status of the Rx (morphine)

## 2016-09-14 NOTE — Telephone Encounter (Signed)
He has an appt tomorrow, so I will print it then. Thanks, BJ

## 2016-09-14 NOTE — Telephone Encounter (Signed)
I thought it was 09/15/16 (according to [facility]). Rx can be printed # 120 to continue qid as needed.  Toya Smothers, BJ

## 2016-09-15 ENCOUNTER — Other Ambulatory Visit: Payer: Self-pay | Admitting: Pharmacist

## 2016-09-15 DIAGNOSIS — J449 Chronic obstructive pulmonary disease, unspecified: Secondary | ICD-10-CM | POA: Diagnosis not present

## 2016-09-15 MED ORDER — MORPHINE SULFATE 15 MG PO TABS: 15.0000 mg | ORAL_TABLET | Freq: Four times a day (QID) | ORAL | 0 refills | Status: DC

## 2016-09-15 NOTE — Patient Outreach (Signed)
[facility]) Care Management  09/15/2016  [name] 05/23/1951 [id number]  Follow-up phone call to patient regarding his patient assistance.  Patient verified HIPAA details.    Patient reports he received a 90 day supply of Symbicort and Daliresp from [facility].    He is aware that his eligibility for manufacturer assistance for these medications is only valid through 10/01/16 and he will need to meet requirements and re-apply in 2018.    Patient denies other pharmacy related questions/concerns.   Plan:  Patient confirmed he has [facility] Pharmacist phone number and was counseled he can contact [facility] Pharmacist if he has new pharmacy concerns.   Will close case.  Will route note to Dr Ashok Cordia to make him aware patient received Symbicort and Daliresp.   Will update [facility] RN Pam of pharmacy case closure.   Karrie Meres, PharmD, [place] [phone]

## 2016-09-15 NOTE — Telephone Encounter (Signed)
Left voicemail letting patient know Rx is ready for pick up at the front desk.

## 2016-09-15 NOTE — Telephone Encounter (Signed)
Rx printed

## 2016-09-18 ENCOUNTER — Other Ambulatory Visit: Payer: Self-pay | Admitting: Pulmonary Disease

## 2016-09-18 ENCOUNTER — Other Ambulatory Visit: Payer: Self-pay

## 2016-09-19 ENCOUNTER — Encounter: Payer: Self-pay | Admitting: Pulmonary Disease

## 2016-09-19 ENCOUNTER — Ambulatory Visit (INDEPENDENT_AMBULATORY_CARE_PROVIDER_SITE_OTHER): Payer: Commercial Managed Care - HMO | Admitting: Pulmonary Disease

## 2016-09-19 ENCOUNTER — Ambulatory Visit (INDEPENDENT_AMBULATORY_CARE_PROVIDER_SITE_OTHER)
Admission: RE | Admit: 2016-09-19 | Discharge: 2016-09-19 | Disposition: A | Discharge status: Home / Self Care | Payer: Commercial Managed Care - HMO | Source: Ambulatory Visit | Attending: Pulmonary Disease | Admitting: Pulmonary Disease

## 2016-09-19 VITALS — BP 114/72 | HR 86 | Ht 67.0 in | Wt 167.2 lb

## 2016-09-19 DIAGNOSIS — R918 Other nonspecific abnormal finding of lung field: Secondary | ICD-10-CM

## 2016-09-19 DIAGNOSIS — J449 Chronic obstructive pulmonary disease, unspecified: Secondary | ICD-10-CM | POA: Diagnosis not present

## 2016-09-19 DIAGNOSIS — J441 Chronic obstructive pulmonary disease with (acute) exacerbation: Secondary | ICD-10-CM

## 2016-09-19 DIAGNOSIS — G4734 Idiopathic sleep related nonobstructive alveolar hypoventilation: Secondary | ICD-10-CM

## 2016-09-19 MED ORDER — LEVOFLOXACIN 500 MG PO TABS: 500.0000 mg | ORAL_TABLET | Freq: Every day | ORAL | 0 refills | Status: DC

## 2016-09-19 MED ORDER — METHYLPREDNISOLONE ACETATE 80 MG/ML IJ SUSP
60.0000 mg | Freq: Once | INTRAMUSCULAR | Status: AC
  Administered 2016-09-19: 60 mg via INTRAMUSCULAR

## 2016-09-19 MED ORDER — PREDNISONE 10 MG PO TABS: ORAL_TABLET | ORAL | 0 refills | Status: DC
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
## 2016-09-19 NOTE — Patient Instructions (Addendum)
   Remember to stop the Levaquin if you have any muscle or joint pains.  Seek immediate medical attention if you get worse.  Use Mucinex twice daily with plenty of liquids.   I will see you back in April as planned or sooner if needed.   TESTS ORDERED: 1. Sputum culture for AFB, Fungus, & Bacteria. 2. CXR PA/LAT today

## 2016-09-19 NOTE — Addendum Note (Signed)
Addended by: Parke Poisson E on: 09/19/2016 05:37 PM   Modules accepted: Orders

## 2016-09-20 ENCOUNTER — Other Ambulatory Visit: Payer: Self-pay

## 2016-09-20 MED ORDER — ALBUTEROL SULFATE (5 MG/ML) 0.5% IN NEBU: INHALATION_SOLUTION | RESPIRATORY_TRACT | 3 refills | Status: DC

## 2016-09-21 ENCOUNTER — Encounter (HOSPITAL_COMMUNITY): Payer: Self-pay | Admitting: Emergency Medicine

## 2016-09-21 ENCOUNTER — Inpatient Hospital Stay (HOSPITAL_COMMUNITY)
Admission: EM | Admit: 2016-09-21 | Discharge: 2016-10-02 | DRG: 190 | Disposition: A | Discharge status: Home Health | Payer: Commercial Managed Care - HMO | Attending: Family Medicine | Admitting: Family Medicine

## 2016-09-21 ENCOUNTER — Emergency Department (HOSPITAL_COMMUNITY): Payer: Commercial Managed Care - HMO

## 2016-09-21 ENCOUNTER — Ambulatory Visit: Payer: Commercial Managed Care - HMO | Admitting: Family Medicine

## 2016-09-21 ENCOUNTER — Other Ambulatory Visit: Payer: Self-pay

## 2016-09-21 ENCOUNTER — Other Ambulatory Visit: Payer: Self-pay | Admitting: Pulmonary Disease

## 2016-09-21 DIAGNOSIS — J9621 Acute and chronic respiratory failure with hypoxia: Secondary | ICD-10-CM | POA: Diagnosis not present

## 2016-09-21 DIAGNOSIS — Z8249 Family history of ischemic heart disease and other diseases of the circulatory system: Secondary | ICD-10-CM

## 2016-09-21 DIAGNOSIS — I5043 Acute on chronic combined systolic (congestive) and diastolic (congestive) heart failure: Secondary | ICD-10-CM | POA: Diagnosis present

## 2016-09-21 DIAGNOSIS — E860 Dehydration: Secondary | ICD-10-CM | POA: Diagnosis not present

## 2016-09-21 DIAGNOSIS — I714 Abdominal aortic aneurysm, without rupture, unspecified: Secondary | ICD-10-CM

## 2016-09-21 DIAGNOSIS — I110 Hypertensive heart disease with heart failure: Secondary | ICD-10-CM | POA: Diagnosis present

## 2016-09-21 DIAGNOSIS — D649 Anemia, unspecified: Secondary | ICD-10-CM | POA: Diagnosis not present

## 2016-09-21 DIAGNOSIS — Z888 Allergy status to other drugs, medicaments and biological substances status: Secondary | ICD-10-CM

## 2016-09-21 DIAGNOSIS — K219 Gastro-esophageal reflux disease without esophagitis: Secondary | ICD-10-CM | POA: Diagnosis present

## 2016-09-21 DIAGNOSIS — G8929 Other chronic pain: Secondary | ICD-10-CM | POA: Diagnosis present

## 2016-09-21 DIAGNOSIS — J209 Acute bronchitis, unspecified: Secondary | ICD-10-CM | POA: Diagnosis not present

## 2016-09-21 DIAGNOSIS — Z9981 Dependence on supplemental oxygen: Secondary | ICD-10-CM | POA: Diagnosis not present

## 2016-09-21 DIAGNOSIS — R0602 Shortness of breath: Secondary | ICD-10-CM | POA: Diagnosis not present

## 2016-09-21 DIAGNOSIS — I5032 Chronic diastolic (congestive) heart failure: Secondary | ICD-10-CM | POA: Diagnosis present

## 2016-09-21 DIAGNOSIS — F419 Anxiety disorder, unspecified: Secondary | ICD-10-CM | POA: Diagnosis present

## 2016-09-21 DIAGNOSIS — D72829 Elevated white blood cell count, unspecified: Secondary | ICD-10-CM | POA: Diagnosis not present

## 2016-09-21 DIAGNOSIS — R739 Hyperglycemia, unspecified: Secondary | ICD-10-CM | POA: Diagnosis not present

## 2016-09-21 DIAGNOSIS — T380X5A Adverse effect of glucocorticoids and synthetic analogues, initial encounter: Secondary | ICD-10-CM | POA: Diagnosis not present

## 2016-09-21 DIAGNOSIS — J441 Chronic obstructive pulmonary disease with (acute) exacerbation: Secondary | ICD-10-CM | POA: Diagnosis not present

## 2016-09-21 DIAGNOSIS — I480 Paroxysmal atrial fibrillation: Secondary | ICD-10-CM | POA: Diagnosis present

## 2016-09-21 DIAGNOSIS — I255 Ischemic cardiomyopathy: Secondary | ICD-10-CM | POA: Diagnosis present

## 2016-09-21 DIAGNOSIS — J449 Chronic obstructive pulmonary disease, unspecified: Secondary | ICD-10-CM | POA: Diagnosis not present

## 2016-09-21 DIAGNOSIS — Z881 Allergy status to other antibiotic agents status: Secondary | ICD-10-CM

## 2016-09-21 DIAGNOSIS — I517 Cardiomegaly: Secondary | ICD-10-CM | POA: Diagnosis not present

## 2016-09-21 DIAGNOSIS — I10 Essential (primary) hypertension: Secondary | ICD-10-CM | POA: Diagnosis present

## 2016-09-21 DIAGNOSIS — Z880 Allergy status to penicillin: Secondary | ICD-10-CM

## 2016-09-21 DIAGNOSIS — I25118 Atherosclerotic heart disease of native coronary artery with other forms of angina pectoris: Secondary | ICD-10-CM | POA: Diagnosis not present

## 2016-09-21 DIAGNOSIS — I4729 Other ventricular tachycardia: Secondary | ICD-10-CM

## 2016-09-21 DIAGNOSIS — I2510 Atherosclerotic heart disease of native coronary artery without angina pectoris: Secondary | ICD-10-CM | POA: Diagnosis present

## 2016-09-21 DIAGNOSIS — G894 Chronic pain syndrome: Secondary | ICD-10-CM | POA: Diagnosis present

## 2016-09-21 DIAGNOSIS — Z87891 Personal history of nicotine dependence: Secondary | ICD-10-CM

## 2016-09-21 DIAGNOSIS — E785 Hyperlipidemia, unspecified: Secondary | ICD-10-CM | POA: Diagnosis present

## 2016-09-21 DIAGNOSIS — R112 Nausea with vomiting, unspecified: Secondary | ICD-10-CM | POA: Diagnosis not present

## 2016-09-21 DIAGNOSIS — Z7952 Long term (current) use of systemic steroids: Secondary | ICD-10-CM

## 2016-09-21 DIAGNOSIS — Z955 Presence of coronary angioplasty implant and graft: Secondary | ICD-10-CM

## 2016-09-21 DIAGNOSIS — R402411 Glasgow coma scale score 13-15, in the field [EMT or ambulance]: Secondary | ICD-10-CM | POA: Diagnosis not present

## 2016-09-21 DIAGNOSIS — I472 Ventricular tachycardia: Secondary | ICD-10-CM | POA: Diagnosis not present

## 2016-09-21 DIAGNOSIS — J9612 Chronic respiratory failure with hypercapnia: Secondary | ICD-10-CM

## 2016-09-21 DIAGNOSIS — J440 Chronic obstructive pulmonary disease with acute lower respiratory infection: Secondary | ICD-10-CM | POA: Diagnosis not present

## 2016-09-21 DIAGNOSIS — Z79899 Other long term (current) drug therapy: Secondary | ICD-10-CM

## 2016-09-21 DIAGNOSIS — Z7982 Long term (current) use of aspirin: Secondary | ICD-10-CM

## 2016-09-21 HISTORY — DX: Paroxysmal atrial fibrillation: I48.0

## 2016-09-21 HISTORY — DX: Chronic combined systolic (congestive) and diastolic (congestive) heart failure: I50.42

## 2016-09-21 LAB — I-STAT VENOUS BLOOD GAS, ED
Acid-Base Excess: 5 mmol/L — ABNORMAL HIGH (ref 0.0–2.0)
BICARBONATE: 30.5 mmol/L — AB (ref 20.0–28.0)
O2 SAT: 73 %
PCO2 VEN: 45.2 mmHg (ref 44.0–60.0)
PO2 VEN: 37 mmHg (ref 32.0–45.0)
TCO2: 32 mmol/L (ref 0–100)
pH, Ven: 7.438 — ABNORMAL HIGH (ref 7.250–7.430)

## 2016-09-21 LAB — CBC WITH DIFFERENTIAL/PLATELET
Basophils Absolute: 0 10*3/uL (ref 0.0–0.1)
Basophils Relative: 0 %
EOS ABS: 0 10*3/uL (ref 0.0–0.7)
Eosinophils Relative: 0 %
HCT: 38.8 % — ABNORMAL LOW (ref 39.0–52.0)
HEMOGLOBIN: 13 g/dL (ref 13.0–17.0)
LYMPHS ABS: 0.9 10*3/uL (ref 0.7–4.0)
Lymphocytes Relative: 8 %
MCH: 31.4 pg (ref 26.0–34.0)
MCHC: 33.5 g/dL (ref 30.0–36.0)
MCV: 93.7 fL (ref 78.0–100.0)
MONOS PCT: 6 %
Monocytes Absolute: 0.7 10*3/uL (ref 0.1–1.0)
NEUTROS PCT: 86 %
Neutro Abs: 9.7 10*3/uL — ABNORMAL HIGH (ref 1.7–7.7)
Platelets: 231 10*3/uL (ref 150–400)
RBC: 4.14 MIL/uL — ABNORMAL LOW (ref 4.22–5.81)
RDW: 14.9 % (ref 11.5–15.5)
WBC: 11.3 10*3/uL — ABNORMAL HIGH (ref 4.0–10.5)

## 2016-09-21 LAB — MAGNESIUM: MAGNESIUM: 2.5 mg/dL — AB (ref 1.7–2.4)

## 2016-09-21 LAB — BASIC METABOLIC PANEL
ANION GAP: 14 (ref 5–15)
BUN: 27 mg/dL — ABNORMAL HIGH (ref 6–20)
CALCIUM: 10.3 mg/dL (ref 8.9–10.3)
CO2: 25 mmol/L (ref 22–32)
Chloride: 101 mmol/L (ref 101–111)
Creatinine, Ser: 1.35 mg/dL — ABNORMAL HIGH (ref 0.61–1.24)
GFR calc Af Amer: >60 mL/min (ref >60)
GFR calc non Af Amer: 53 mL/min — ABNORMAL LOW (ref >60)
GLUCOSE: 119 mg/dL — AB (ref 65–99)
POTASSIUM: 4.1 mmol/L (ref 3.5–5.1)
Sodium: 140 mmol/L (ref 135–145)

## 2016-09-21 LAB — PHOSPHORUS: Phosphorus: 3.6 mg/dL (ref 2.5–4.6)

## 2016-09-21 LAB — I-STAT TROPONIN, ED: TROPONIN I, POC: 0.02 ng/mL (ref 0.00–0.08)

## 2016-09-21 MED ORDER — ONDANSETRON HCL 4 MG PO TABS
4.0000 mg | ORAL_TABLET | Freq: Four times a day (QID) | ORAL | Status: DC | PRN
  Administered 2016-09-30 – 2016-10-01 (×2): 4 mg via ORAL
  Filled 2016-09-21 (×2): qty 1

## 2016-09-21 MED ORDER — FERROUS SULFATE 325 (65 FE) MG PO TABS
325.0000 mg | ORAL_TABLET | Freq: Every day | ORAL | Status: DC
  Administered 2016-09-22 – 2016-10-02 (×11): 325 mg via ORAL
  Filled 2016-09-21 (×12): qty 1

## 2016-09-21 MED ORDER — OXYCODONE HCL 5 MG PO TABS: 15.0000 mg | ORAL_TABLET | Freq: Four times a day (QID) | ORAL | Status: DC | PRN

## 2016-09-21 MED ORDER — IRON 325 (65 FE) MG PO TABS: 1.0000 | ORAL_TABLET | Freq: Every day | ORAL | Status: DC

## 2016-09-21 MED ORDER — ADULT MULTIVITAMIN W/MINERALS CH
1.0000 | ORAL_TABLET | Freq: Every day | ORAL | Status: DC
  Administered 2016-09-21 – 2016-10-02 (×12): 1 via ORAL
  Filled 2016-09-21 (×12): qty 1

## 2016-09-21 MED ORDER — ALBUTEROL (5 MG/ML) CONTINUOUS INHALATION SOLN
10.0000 mg/h | INHALATION_SOLUTION | Freq: Once | RESPIRATORY_TRACT | Status: AC
  Administered 2016-09-21: 10 mg/h via RESPIRATORY_TRACT
  Filled 2016-09-21: qty 20

## 2016-09-21 MED ORDER — ACETAMINOPHEN 650 MG RE SUPP: 650.0000 mg | Freq: Four times a day (QID) | RECTAL | Status: DC | PRN

## 2016-09-21 MED ORDER — ENOXAPARIN SODIUM 40 MG/0.4ML ~~LOC~~ SOLN
40.0000 mg | SUBCUTANEOUS | Status: DC
  Administered 2016-09-21 – 2016-10-01 (×11): 40 mg via SUBCUTANEOUS
  Filled 2016-09-21 (×11): qty 0.4

## 2016-09-21 MED ORDER — MOMETASONE FURO-FORMOTEROL FUM 200-5 MCG/ACT IN AERO
2.0000 | INHALATION_SPRAY | Freq: Two times a day (BID) | RESPIRATORY_TRACT | Status: DC
  Administered 2016-09-21 – 2016-09-26 (×10): 2 via RESPIRATORY_TRACT
  Filled 2016-09-21: qty 8.8

## 2016-09-21 MED ORDER — MORPHINE SULFATE 15 MG PO TABS
15.0000 mg | ORAL_TABLET | Freq: Once | ORAL | Status: AC
  Administered 2016-09-21: 15 mg via ORAL
  Filled 2016-09-21: qty 1

## 2016-09-21 MED ORDER — IPRATROPIUM BROMIDE 0.02 % IN SOLN
0.5000 mg | Freq: Once | RESPIRATORY_TRACT | Status: AC
  Administered 2016-09-21: 0.5 mg via RESPIRATORY_TRACT
  Filled 2016-09-21: qty 2.5

## 2016-09-21 MED ORDER — BISOPROLOL FUMARATE 5 MG PO TABS
5.0000 mg | ORAL_TABLET | Freq: Every day | ORAL | Status: DC
  Administered 2016-09-21 – 2016-10-02 (×12): 5 mg via ORAL
  Filled 2016-09-21 (×12): qty 1

## 2016-09-21 MED ORDER — ASPIRIN 325 MG PO TABS
325.0000 mg | ORAL_TABLET | Freq: Every day | ORAL | Status: DC
  Administered 2016-09-21 – 2016-10-02 (×12): 325 mg via ORAL
  Filled 2016-09-21 (×12): qty 1

## 2016-09-21 MED ORDER — ISOSORBIDE DINITRATE 5 MG PO TABS
15.0000 mg | ORAL_TABLET | Freq: Every day | ORAL | Status: DC
  Administered 2016-09-21 – 2016-10-02 (×12): 15 mg via ORAL
  Filled 2016-09-21 (×12): qty 1

## 2016-09-21 MED ORDER — ATORVASTATIN CALCIUM 80 MG PO TABS
80.0000 mg | ORAL_TABLET | Freq: Every day | ORAL | Status: DC
  Administered 2016-09-21 – 2016-10-02 (×12): 80 mg via ORAL
  Filled 2016-09-21 (×12): qty 1

## 2016-09-21 MED ORDER — SODIUM CHLORIDE 0.9% FLUSH
3.0000 mL | Freq: Two times a day (BID) | INTRAVENOUS | Status: DC
  Administered 2016-09-21 – 2016-10-02 (×21): 3 mL via INTRAVENOUS

## 2016-09-21 MED ORDER — TRAZODONE HCL 50 MG PO TABS
25.0000 mg | ORAL_TABLET | Freq: Every evening | ORAL | Status: DC | PRN
  Filled 2016-09-21: qty 1

## 2016-09-21 MED ORDER — ACETAMINOPHEN 325 MG PO TABS: 650.0000 mg | ORAL_TABLET | Freq: Four times a day (QID) | ORAL | Status: DC | PRN

## 2016-09-21 MED ORDER — ROFLUMILAST 500 MCG PO TABS
500.0000 ug | ORAL_TABLET | Freq: Every day | ORAL | Status: DC
  Administered 2016-09-21 – 2016-10-02 (×12): 500 ug via ORAL
  Filled 2016-09-21 (×12): qty 1

## 2016-09-21 MED ORDER — SODIUM CHLORIDE 0.9 % IV SOLN
INTRAVENOUS | Status: AC
  Administered 2016-09-21: 14:00:00 via INTRAVENOUS

## 2016-09-21 MED ORDER — LEVOFLOXACIN IN D5W 500 MG/100ML IV SOLN
500.0000 mg | INTRAVENOUS | Status: DC
  Administered 2016-09-21 – 2016-09-26 (×6): 500 mg via INTRAVENOUS
  Filled 2016-09-21 (×7): qty 100

## 2016-09-21 MED ORDER — LEVALBUTEROL HCL 0.63 MG/3ML IN NEBU
0.6300 mg | INHALATION_SOLUTION | Freq: Three times a day (TID) | RESPIRATORY_TRACT | Status: DC
Start: 2016-09-21 — End: 2016-09-22
  Administered 2016-09-21 – 2016-09-22 (×3): 0.63 mg via RESPIRATORY_TRACT
  Filled 2016-09-21 (×3): qty 3

## 2016-09-21 MED ORDER — HYDRALAZINE HCL 10 MG PO TABS
10.0000 mg | ORAL_TABLET | Freq: Three times a day (TID) | ORAL | Status: DC
  Administered 2016-09-21 – 2016-10-02 (×32): 10 mg via ORAL
  Filled 2016-09-21 (×32): qty 1

## 2016-09-21 MED ORDER — MORPHINE SULFATE 15 MG PO TABS
15.0000 mg | ORAL_TABLET | Freq: Four times a day (QID) | ORAL | Status: DC | PRN
  Administered 2016-09-21 – 2016-10-02 (×43): 15 mg via ORAL
  Filled 2016-09-21 (×43): qty 1

## 2016-09-21 MED ORDER — METHYLPREDNISOLONE SODIUM SUCC 125 MG IJ SOLR
60.0000 mg | Freq: Four times a day (QID) | INTRAMUSCULAR | Status: DC
  Administered 2016-09-21 – 2016-09-23 (×9): 60 mg via INTRAVENOUS
  Filled 2016-09-21 (×9): qty 2

## 2016-09-21 MED ORDER — ALPRAZOLAM 0.25 MG PO TABS
0.2500 mg | ORAL_TABLET | Freq: Three times a day (TID) | ORAL | Status: DC | PRN
  Administered 2016-09-21 – 2016-10-02 (×32): 0.25 mg via ORAL
  Filled 2016-09-21 (×32): qty 1

## 2016-09-21 MED ORDER — ONDANSETRON HCL 4 MG/2ML IJ SOLN: 4.0000 mg | Freq: Four times a day (QID) | INTRAMUSCULAR | Status: DC | PRN

## 2016-09-21 MED ORDER — ALPRAZOLAM 0.25 MG PO TABS
0.2500 mg | ORAL_TABLET | Freq: Once | ORAL | Status: AC
  Administered 2016-09-21: 0.25 mg via ORAL
  Filled 2016-09-21: qty 1

## 2016-09-21 NOTE — ED Notes (Signed)
Ordered diet tray 

## 2016-09-21 NOTE — ED Notes (Signed)
Called respiratory about xopenex.  They have sent for it from pharmacy and will administer it when it gets here
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
## 2016-09-21 NOTE — ED Provider Notes (Addendum)
[place] DEPT Provider Note   CSN: [id number] Arrival date & time: 09/21/16  0550     History   Chief Complaint Chief Complaint  Patient presents with  . Shortness of Breath    HPI [name] is a 65 y.o. male.  Patient is a 65 year old male with significant medical history including a cheerful a, coronary artery disease, COPD, ischemic cardiomyopathy and chronic pain who is presenting today with worsening shortness of breath. Patient states he started to have a more productive cough with change in sputum and shortness of breath on Monday. On Tuesday he saw his pulmonologist Dr. Milinda Hirschfeld and at that time was started on Levaquin and prednisone. He has been using his nebulizer every 3 hours since Tuesday. Last night patient's wife fell out of bed and he worked repeatedly to get her out of the floor. From then on his shortness of breath became significantly worse he has been unable to catch his breath. When EMS arrived patient was in respiratory distress and was given albuterol, Atrovent, magnesium, Solu-Medrol. Upon arrival here he states he feels slightly better but is still complaining of shortness of breath. He denies fever, chest pain, nausea, vomiting or lower extremity swelling.   The history is provided by the patient.  Shortness of Breath  This is a recurrent problem.    Past Medical History:  Diagnosis Date  . AAA (abdominal aortic aneurysm) (HCC)    5.0 X 4.8;  followed by vascular surgery  . Anemia   . Carotid stenosis    carotids 09/2011 bilateral 60-79% ICA stenosis  . COPD (chronic obstructive pulmonary disease) (HCC)    Golds Stage II- Fev1 73% , FVC 49%, Rv 135%, DLCO 68%-03/2007  . Coronary artery disease 11/12   LHC 08/04/11:normal LM. The proximal LAD just beyond the ostium of this diagonal had a long 90% stenosis with TIMI 2 flow. The LAD was also collateralized by the RCA. No disease in the Lcx. 50% mid RCA stenosis. The RCA gives collaterals to the LAD.  LV EF appeared normal, estimate 55%, no definite wall motion abnormalities.  PCI was attempted of his LAD but could not be crossed - med Tx  . Dyspnea   . GERD (gastroesophageal reflux disease)   . Hepatitis, alcoholic    January [id number]  . History of hypokalemia    secondary to alcohol abuse January 2006  . Hyperlipidemia   . Hypertension   . Hypomagnesemia    secondary to alcohol abuse January 2006  . Ischemic cardiomyopathy    echo 09/2011: EF 35-40%, akinesis of the distal LV, moderate LVH, grade 1 diastolic dysfunction, moderate LAE, PASP 34.  [place] Kitchen Left eye trauma    status post 15 years agoto the left eye  . Lumbar back pain   . Migraine headache   . Myocardial infarction 11/12  . PAD (peripheral artery disease) (HCC)    with intermittent claudication, bilateral SFA occlusion  . Pneumonia O[place]. 2012  . Renal artery stenosis (HCC)    Abdominal ultrasound 03/2010:4.7 x 5.0 cm AAA, critical right renal artery stenosis and greater than 60% left renal artery stenosis  . Subclavian artery stenosis, left (HCC)    left subclavian stenosis by ultrasound   . Visual field cut    right with supranasal quadrantopia to retinal artery occlusion    Patient Active Problem List   Diagnosis Date Noted  . Chronic pain disorder 03/23/2016  . Lumbar back pain with radiculopathy affecting right lower extremity 03/23/2016  .  Insomnia, unspecified 03/23/2016  . Cardiomyopathy, ischemic 01/02/2016  . COPD exacerbation ([place]) 12/31/2015  . Cough 07/14/2015  . Skin lesion 09/14/2014  . Chronic systolic dysfunction of left ventricle 04/26/2014  . Tobacco abuse 04/26/2014  . Carotid artery narrowing 01/15/2014  . Aneurysm, thoracoabdominal ([place]) 01/15/2014  . Preventative health care 12/15/2013  . History of acute renal failure 04/18/2013  . Aneurysm of thoracic aorta ([place]) 02/27/2012  . Ischemic cardiomyopathy 02/27/2012  . Nodule of left lung 02/03/2012  . Chronic LBP 11/22/2011  . Anxiety disorder  11/22/2011  . Angiopathy, peripheral ([place]) 11/22/2011  . Occlusion and stenosis of carotid artery without mention of cerebral infarction 10/18/2011  . Normocytic anemia 09/27/2011  . Coronary artery disease   . Thrombocytopenia ([place]) 08/06/2011  . History of non-ST elevation myocardial infarction (NSTEMI) 08/03/2011  . RENAL ATHEROSCLEROSIS 05/16/2010  . Cerebrovascular disease 02/23/2010  . CLAUDICATION 02/23/2010  . MURMUR 02/23/2010  . ACHILLES TENDINITIS 09/02/2009  . BACK PAIN, LUMBAR 02/04/2009  . UNSPECIFIED RETINAL VASCULAR OCCLUSION 09/20/2007  . Abdominal aortic aneurysm ([place]) 09/20/2007  . MIGRAINE HEADACHE 08/09/2007  . Hyperlipidemia 07/26/2007  . Essential hypertension 07/26/2007  . COPD with chronic bronchitis ([place]) 07/26/2007    Past Surgical History:  Procedure Laterality Date  . CARDIAC CATHETERIZATION    . LEFT HEART CATHETERIZATION WITH CORONARY ANGIOGRAM N/A 08/11/2011   Procedure: LEFT HEART CATHETERIZATION WITH CORONARY ANGIOGRAM;  Surgeon: Larey Dresser, MD;  Location[facility] CATH LAB;  Service: Cardiovascular;  Laterality: N/A;  . PERCUTANEOUS CORONARY STENT INTERVENTION (PCI-S) N/A 08/11/2011   Procedure: PERCUTANEOUS CORONARY STENT INTERVENTION (PCI-S);  Surgeon: Peter M Martinique, MD;  Location: [facility] CATH LAB;  Service: Cardiovascular;  Laterality: N/A;  . TONSILLECTOMY    . TONSILLECTOMY AND ADENOIDECTOMY  1958       Home Medications    Prior to Admission medications   Medication Sig Start Date End Date Taking? Authorizing Provider  albuterol (PROVENTIL HFA;VENTOLIN HFA) 108 (90 Base) MCG/ACT inhaler Inhale 2 puffs into the lungs every 6 (six) hours as needed for wheezing or shortness of breath. 01/13/16  Yes Magdalen Spatz, NP  albuterol (PROVENTIL) (5 MG/ML) 0.5% nebulizer solution TAKE 0.5 ML(S) BY NEBULIZATION EVERY FOUR HOURS AS NEEDED Patient taking differently: Take 2.5 mg by nebulization every 4 (four) hours as needed for wheezing or shortness of  breath.  09/20/16  Yes Javier Glazier, MD  ALPRAZolam Duanne Moron) 0.25 MG tablet TAKE ONE TABLET BY MOUTH THREE TIMES DAILY AS NEEDED FOR ANXIETY 06/29/16  Yes Betty G Martinique, MD  aspirin 325 MG tablet Take 325 mg by mouth daily.    Yes Historical Provider, MD  atorvastatin (LIPITOR) 80 MG tablet Take 1 tablet (80 mg total) by mouth daily. 08/18/16  Yes Lelon Perla, MD  b complex vitamins tablet Take 1 tablet by mouth daily.    Yes Historical Provider, MD  bisoprolol (ZEBETA) 5 MG tablet TAKE 1 TABLET EVERY DAY 04/25/16  Yes Betty G Martinique, MD  budesonide-formoterol [facility]) 160-4.5 MCG/ACT inhaler Inhale 2 puffs into the lungs 2 (two) times daily.   Yes Historical Provider, MD  Ferrous Sulfate (IRON) 325 (65 FE) MG TABS Take 1 tablet by mouth daily.   Yes Historical Provider, MD  furosemide (LASIX) 20 MG tablet TAKE 1 TABLET EVERY DAY 04/25/16  Yes Betty G Martinique, MD  glucose blood test strip Test once daily E11.9 01/18/16  Yes Eulas Post, MD  hydrALAZINE (APRESOLINE) 10 MG tablet TAKE 1 TABLET THREE TIMES  DAILY 08/07/16  Yes Betty G Martinique, MD  ipratropium (ATROVENT) 0.02 % nebulizer solution Take 2.5 mLs (0.5 mg total) by nebulization every 4 (four) hours as needed for wheezing or shortness of breath. 08/10/16  Yes Magdalen Spatz, NP  ipratropium-albuterol (DUONEB) 0.5-2.5 (3) MG/3ML SOLN Take 3 mLs by nebulization every 6 (six) hours. Use 4 times daily x 4 days, then every 6 hours as needed. Patient taking differently: Take 3 mLs by nebulization every 6 (six) hours as needed (shortness of breath).  08/05/16  Yes Mariel Aloe, MD  isosorbide dinitrate (ISORDIL) 30 MG tablet TAKE 1/2 TABLET EVERY DAY 07/05/16  Yes Betty G Martinique, MD  KRILL OIL 1000 MG CAPS Take 1 capsule by mouth daily.   Yes Historical Provider, MD  Lancets (ACCU-CHEK SOFT TOUCH) lancets Test once daily Dx E11.9 01/18/16  Yes Eulas Post, MD  levofloxacin (LEVAQUIN) 500 MG tablet Take 1 tablet (500 mg total) by mouth  daily. 09/19/16  Yes Javier Glazier, MD  morphine (MSIR) 15 MG tablet Take 1 tablet (15 mg total) by mouth 4 (four) times daily. 09/15/16  Yes Betty G Martinique, MD  multivitamin [facility]) per tablet Take 1 tablet by mouth daily.    Yes Historical Provider, MD  predniSONE (DELTASONE) 10 MG tablet Take 6 tabs by mouth for 3 days, then 5 for 3 days, 4 for 3 days, 3 for 3 days, 2 for 3 days, 1 for 3 days and stop 09/19/16  Yes Javier Glazier, MD  Respiratory Therapy Supplies (FLUTTER) DEVI Use as directed. 01/28/16  Yes Javier Glazier, MD  roflumilast (DALIRESP) 500 MCG TABS tablet Take 1 tablet (500 mcg total) by mouth daily. 08/05/16  Yes Mariel Aloe, MD  traZODone (DESYREL) 50 MG tablet TAKE ONE-HALF TO ONE TABLET BY MOUTH AT BEDTIME AS NEEDED FOR SLEEP 07/12/16  Yes Betty G Martinique, MD    Family History Family History  Problem Relation Age of Onset  . Throat cancer Mother     died young age secondary to throat cancer  . Cancer Mother     Throat  . Heart disease Father   . Hyperlipidemia Father   . Hypertension Father   . Heart attack Father   . Hypertension Sister   . Diabetes Daughter     Social History Social History  Substance Use Topics  . Smoking status: Former Smoker    Packs/day: 1.00    Years: 55.00    Types: Cigarettes    Quit date: 07/30/2016  . Smokeless tobacco: Never Used  . Alcohol use No     Comment: fomer abuse quit 2006     Allergies   Cephalexin; Moxifloxacin; Penicillins; Vancomycin; and Beta adrenergic blockers   Review of Systems Review of Systems  Respiratory: Positive for shortness of breath.   All other systems reviewed and are negative.    Physical Exam Updated Vital Signs BP 140/76   Pulse 104   Temp 97.9 F (36.6 C) (Oral)   Ht 5\' 7"  (1.702 m)   Wt 164 lb (74.4 kg)   SpO2 94%   BMI 25.69 kg/m   Physical Exam  Constitutional: He is oriented to person, place, and time. He appears well-developed and well-nourished. No  distress.  HENT:  Head: Normocephalic and atraumatic.  Mouth/Throat: Oropharynx is clear and moist.  Eyes: Conjunctivae and EOM are normal. Pupils are equal, round, and reactive to light.  Neck: Normal range of motion. Neck supple.  Cardiovascular: Regular  rhythm and intact distal pulses.  Tachycardia present.   No murmur heard. Pulmonary/Chest: Effort normal. Tachypnea noted. No respiratory distress. He has wheezes. He has no rales.  Abdominal: Soft. He exhibits no distension. There is no tenderness. There is no rebound and no guarding.  Musculoskeletal: Normal range of motion. He exhibits no edema or tenderness.  Neurological: He is alert and oriented to person, place, and time.  Skin: Skin is warm and dry. No rash noted. No erythema.  Psychiatric: He has a normal mood and affect. His behavior is normal.  Nursing note and vitals reviewed.    ED Treatments / Results  Labs (all labs ordered are listed, but only abnormal results are displayed) Labs Reviewed  I-STAT VENOUS BLOOD GAS, ED - Abnormal; Notable for the following:       Result Value   pH, Ven 7.438 (*)    Bicarbonate 30.5 (*)    Acid-Base Excess 5.0 (*)    All other components within normal limits  CBC WITH DIFFERENTIAL/PLATELET  BASIC METABOLIC PANEL  I-STAT TROPOININ, ED    EKG  EKG Interpretation  Date/Time:  Thursday September 21 2016 06:07:06 EST Ventricular Rate:  102 PR Interval:    QRS Duration: 114 QT Interval:  383 QTC Calculation: 499 R Axis:   -54 Text Interpretation:  Sinus tachycardia Ventricular premature complex Left anterior fascicular block Anterior infarct, old Minimal ST depression, lateral leads Atrial fibrillation RESOLVED SINCE PREVIOUS Confirmed by Maryan Rued  MD, Loree Fee ([id number]) on 09/21/2016 7:33:15 AM       Radiology Dg Chest 2 View  Result Date: 09/21/2016 CLINICAL DATA:  65 year old male with shortness of breath. History of COPD. Patient aspirated this morning. EXAM: CHEST  2 VIEW  COMPARISON:  Chest radiograph dated 09/19/2016 FINDINGS: There is emphysematous changes of the lungs. There is no focal consolidation, pleural effusion, or pneumothorax. The cardiac silhouette is within normal limits. There is atherosclerotic calcification of the aortic arch. The aorta is somewhat tortuous. There is osteopenia with degenerative changes of the spine. No acute fracture. IMPRESSION: No active cardiopulmonary disease. Emphysema. Electronically Signed   By: Anner Crete M.D.   On: 09/21/2016 06:58   Dg Chest 2 View  Result Date: 09/20/2016 CLINICAL DATA:  COPD exacerbation. EXAM: CHEST  2 VIEW COMPARISON:  Chest radiographs 07/30/2016 and CT 08/30/2016 FINDINGS: The cardiac silhouette is borderline enlarged. Tortuosity and calcified atherosclerosis are again noted involving the thoracic aorta. The lungs remain hyperinflated with changes of emphysema. There is no evidence of acute airspace consolidation, edema, pleural effusion, or pneumothorax. No acute osseous abnormality is identified. IMPRESSION: 1. COPD without evidence of acute cardiopulmonary process. 2. Aortic atherosclerosis. Electronically Signed   By: Logan Bores M.D.   On: 09/20/2016 08:27    Procedures Procedures (including critical care time)  Medications Ordered in ED Medications  ALPRAZolam (XANAX) tablet 0.25 mg (not administered)  morphine (MSIR) tablet 15 mg (not administered)  albuterol (PROVENTIL,VENTOLIN) solution continuous neb (10 mg/hr Nebulization Given 09/21/16 0736)  ipratropium (ATROVENT) nebulizer solution 0.5 mg (0.5 mg Nebulization Given 09/21/16 0736)     Initial Impression / Assessment and Plan / ED Course  I have reviewed the triage vital signs and the nursing notes.  Pertinent labs & imaging results that were available during my care of the patient were reviewed by me and considered in my medical decision making (see chart for details).  Clinical Course     Patient is a 65 year old male  here today with worsening shortness of  breath. Patient has a significant history of COPD and wears oxygen at night. He recently started prednisone and Levaquin for change in sputum and COPD exacerbation. Symptoms worsened last night after attempting to get his wife out of the floor. Low suspicion for ACS, PE, dissection or CHF today. Patient has no chest pain and no evidence of fluid overload. He does however have significant wheezing throughout all lung fields and tachypnea. Patient has already received magnesium, Solu-Medrol, albuterol and Atrovent. We'll place him on an hour-long continuous neb. Labs pending but chest x-ray clear. Feel that patient will require admission.  8:41 AM After an hour-long continuous neb patient still has diffuse wheezing and shortness of breath. Will admit for further care.  CRITICAL CARE Performed by: Blanchie Dessert Total critical care time: 30 minutes Critical care time was exclusive of separately billable procedures and treating other patients. Critical care was necessary to treat or prevent imminent or life-threatening deterioration. Critical care was time spent personally by me on the following activities: development of treatment plan with patient and/or surrogate as well as nursing, discussions with consultants, evaluation of patient's response to treatment, examination of patient, obtaining history from patient or surrogate, ordering and performing treatments and interventions, ordering and review of laboratory studies, ordering and review of radiographic studies, pulse oximetry and re-evaluation of patient's condition.   Final Clinical Impressions(s) / ED Diagnoses   Final diagnoses:  COPD exacerbation [facility])    New Prescriptions New Prescriptions   No medications on file     Blanchie Dessert, MD 09/21/16 [place], MD 09/21/16 [phone]

## 2016-09-21 NOTE — ED Notes (Signed)
Report given to Molly, RN

## 2016-09-21 NOTE — ED Triage Notes (Signed)
Brought via ems from home.  Seen at PCP on Monday for SOB.  Chest xray obtained.  Reports no pneumonia but worsening sob since Monday.  Tried 4 neb treatments at home.  Given albuterol 10mg , atrovent 1mg , solumedrol 125mg , magnesium 2gm.

## 2016-09-21 NOTE — Progress Notes (Signed)
Pt admitted to the unit. On flu precautions. Pt is stable, alert and oriented per baseline. Oriented to room, staff, and call bell. Educated to call for any assistance. Bed in lowest position, call bell within reach- will continue to monitor.

## 2016-09-21 NOTE — ED Notes (Signed)
Report attempted 

## 2016-09-22 DIAGNOSIS — I2583 Coronary atherosclerosis due to lipid rich plaque: Secondary | ICD-10-CM

## 2016-09-22 DIAGNOSIS — D649 Anemia, unspecified: Secondary | ICD-10-CM

## 2016-09-22 DIAGNOSIS — I2510 Atherosclerotic heart disease of native coronary artery without angina pectoris: Secondary | ICD-10-CM

## 2016-09-22 DIAGNOSIS — J449 Chronic obstructive pulmonary disease, unspecified: Secondary | ICD-10-CM

## 2016-09-22 DIAGNOSIS — G894 Chronic pain syndrome: Secondary | ICD-10-CM

## 2016-09-22 LAB — CBC
HCT: 37.4 % — ABNORMAL LOW (ref 39.0–52.0)
HEMOGLOBIN: 12.4 g/dL — AB (ref 13.0–17.0)
MCH: 31.2 pg (ref 26.0–34.0)
MCHC: 33.2 g/dL (ref 30.0–36.0)
MCV: 94.2 fL (ref 78.0–100.0)
PLATELETS: 204 10*3/uL (ref 150–400)
RBC: 3.97 MIL/uL — ABNORMAL LOW (ref 4.22–5.81)
RDW: 14.8 % (ref 11.5–15.5)
WBC: 12.8 10*3/uL — ABNORMAL HIGH (ref 4.0–10.5)

## 2016-09-22 LAB — RESPIRATORY PANEL BY PCR

## 2016-09-22 LAB — BASIC METABOLIC PANEL
Anion gap: 9 (ref 5–15)
BUN: 33 mg/dL — ABNORMAL HIGH (ref 6–20)
CALCIUM: 9.6 mg/dL (ref 8.9–10.3)
CO2: 28 mmol/L (ref 22–32)
CREATININE: 1.42 mg/dL — AB (ref 0.61–1.24)
Chloride: 101 mmol/L (ref 101–111)
GFR calc Af Amer: 58 mL/min — ABNORMAL LOW (ref >60)
GFR calc non Af Amer: 50 mL/min — ABNORMAL LOW (ref >60)
Glucose, Bld: 184 mg/dL — ABNORMAL HIGH (ref 65–99)
Potassium: 4.3 mmol/L (ref 3.5–5.1)
Sodium: 138 mmol/L (ref 135–145)

## 2016-09-22 MED ORDER — IPRATROPIUM-ALBUTEROL 0.5-2.5 (3) MG/3ML IN SOLN
3.0000 mL | Freq: Once | RESPIRATORY_TRACT | Status: AC
  Administered 2016-09-22: 3 mL via RESPIRATORY_TRACT
  Filled 2016-09-22: qty 3

## 2016-09-22 MED ORDER — IPRATROPIUM-ALBUTEROL 0.5-2.5 (3) MG/3ML IN SOLN
3.0000 mL | RESPIRATORY_TRACT | Status: DC
  Administered 2016-09-22 – 2016-10-02 (×60): 3 mL via RESPIRATORY_TRACT
  Filled 2016-09-22 (×58): qty 3

## 2016-09-22 MED ORDER — FUROSEMIDE 20 MG PO TABS
20.0000 mg | ORAL_TABLET | Freq: Every day | ORAL | Status: DC
  Administered 2016-09-22 – 2016-09-25 (×4): 20 mg via ORAL
  Filled 2016-09-22 (×4): qty 1

## 2016-09-22 NOTE — Progress Notes (Signed)
Patient Name - [name], [name]   Reason for Consult Comment: OP Pulmonologist has clarified pt had traumatic achilles tendon rupture and WAS NOT due to fluoroquinolone  Received consult from nurse practitioner stating achilles tendon rupture was not due to fluoroquinolone - I have removed this from his allergy list.  Rober Minion, PharmD., MS Clinical Pharmacist Pager:  [phone] Thank you for allowing pharmacy to be part of this patients care team.

## 2016-09-22 NOTE — Plan of Care (Signed)
Problem: Safety: Goal: Ability to remain free from injury will improve Outcome: Progressing No fall or injury noted this shift  Problem: Pain Managment: Goal: General experience of comfort will improve Medicated twice for back pain with moderate relief  Problem: Tissue Perfusion: Goal: Risk factors for ineffective tissue perfusion will decrease Outcome: Progressing No signs of DVT   Problem: Activity: Goal: Risk for activity intolerance will decrease Outcome: Not Progressing Severe respiratory distress noted after using the BR  Problem: Bowel/Gastric: Goal: Will not experience complications related to bowel motility No gastric or bowel issues reported

## 2016-09-22 NOTE — Significant Event (Signed)
Rapid Response Event Note RN called for increase WOB Overview: Time Called: 0140 Arrival Time: 0142 Event Type: Respiratory  Initial Focused Assessment: On arrival pt standing up beside of bed, mild SOB noted, inspiratory and expiratory wheezing on auscultation. Pt with COPD exacerbation walked to and from bathroom when he became SOB associated with labored breathing and O2 sats 86-88% on 3L [place]. Pt wears 2L [place] at home. O2 sats improved to 97% on 3L .   Interventions: Paged Baltazar Najjar NP for a DuoNeb treatment. Verbal order given and completed for duo neb treatment and to go ahead and give scheduled Solumedrol 60 mg IVP. Pt continues to have inspiratory and expiratory wheezing bilaterally after second breathing treatment. 98% O2 sats, RR 22. Pt feels he is breathing better. Will continue to monitor  Plan of Care (if not transferred): Report given to [facility] RN to monitor pt and call for any concerns.  Event Summary: Name of Physician Notified: Baltazar Najjar NP  at 0200    at    Outcome: Stayed in room and stabalized     [place], [place]

## 2016-09-22 NOTE — Progress Notes (Signed)
Notified by central telemetry that pt had a 4 beat run of VT. Went into pt's room to assess them. Pt sitting at the edge of the bed comfortably. Denied any SOB, CP, or other S/S. Notified Dr. Ree Kida with update. Will continue to monitor

## 2016-09-22 NOTE — Progress Notes (Signed)
PROGRESS NOTE    JEREMIHA Young  [id number] DOB: Dec 24, 1950 DOA: 09/21/2016 PCP: Jason Martinique, MD   Chief Complaint  Patient presents with  . Shortness of Breath     Brief Narrative:  HPI on 09/21/2016 by Ms. Jason Hearing, NP [name] is a 65 y.o. male with medical history significant for history of prior ischemic cardiomyopathy was recovered left ventricular systolic function based on most recent echocardiogram last month, hypertension, COPD on prn nocturnal oxygen as well as left upper lobe opacity and lung nodules followed by Dr. Ashok Young as an outpatient, normocytic anemia, chronic diastolic dysfunction, chronic pain on narcotics, known thoracoabdominal aneurysm and history of tobacco abuse. He was evaluated by his pulmonologist on 12/19 for productive cough with brown sputum, shortness of breath and was started on Levaquin and prednisone taper. He was instructed if symptoms worsened he was to percent for further medical evaluation. Patient reports over the past 24 hours he has not improved and has actually worsened and when he checked his pulse oximetry overnight it was down to 85%. He has increased his nebulizer use to every 2-3 hours with no improvement in symptoms as well. He has not eaten well in the past 24 hours and was unable to sleep. Of note his wife has developed pneumonia symptoms and has also been admitted today to the hospital for her symptoms.  Assessment & Plan   Acute exacerbation of COPD  with acute on chronic bronchitis -Presented with worsening respiratory symptoms/increased work of breathing and excessive wheezing that has not improved with increased use of nebulizers, 48 hours of oral prednisone and Levaquin -Also noted to be hypoxemic (see below) -Continue IV Solu-Medrol, levaquin, nebs, Daliresp,incentive spirometry, supplemental oxygen -Respiratory viral panel negative -Follows up with Dr. Ashok Young, pulmonology   Acute on chronic respiratory failure with  hypoxia  -Typically utilizes 2L prn at night -Continue supplemental O2 throughout the day.  Will attempt to wean on 12/23 if possible  Essential hypertension -Currently controlled -Continue Zebeta and hydralazine  Coronary artery disease -Continue aspirin, statin, beta blocker, nitrate  Diastolic dysfunction with chronic heart failure  -Will restart lasix today as he did receive some IVF upon admission -Currently appears to be euvolemic  -monitor intake/output, daily weights  Normocytic anemia -Hemoglobin stable, 12.4 -Continue iron -monitor CBC  Chronic pain disorder/anxiety disorder -Continue home morphine and Xanax  DVT Prophylaxis  Lovenox  Code Status: Full  Family Communication: None at bedside  Disposition Plan: admitted. Home when stable  Consultants None  Procedures  none  Antibiotics   Anti-infectives    Start     Dose/Rate Route Frequency Ordered Stop   09/21/16 1000  levofloxacin (LEVAQUIN) IVPB 500 mg     500 mg 100 mL/hr over 60 Minutes Intravenous Every 24 hours 09/21/16 0911        Subjective:   [name] seen and examined today.  Patient feels his breathing has improved, but not back to baseline.  Denies chest pain, abdominal pain, N/V/D/C, dizziness, headache. Worried about his wife who was also admitted yesterday for pneumonia.    Objective:   Vitals:   09/22/16 0656 09/22/16 0913 09/22/16 1156 09/22/16 1300  BP: 115/66   124/62  Pulse: 80   76  Resp: 20   19  Temp: 97.9 F (36.6 C)   97.1 F (36.2 C)  TempSrc: Oral   Oral  SpO2: 98% 98% 97% 98%  Weight:      Height:  Intake/Output Summary (Last 24 hours) at 09/22/16 1507 Last data filed at 09/22/16 1300  Gross per 24 hour  Intake              690 ml  Output              325 ml  Net              365 ml   Filed Weights   09/21/16 0558  Weight: 74.4 kg (164 lb)    Exam  General: Well developed, well nourished, NAD, appears stated age  HEENT: NCAT, mucous  membranes moist.   Cardiovascular: S1 S2 auscultated, no rubs, murmurs or gallops. Regular rate and rhythm.  Respiratory: Diminished, coarse breath sounds. Diffuse expiratory wheezing.  Abdomen: Soft, nontender, nondistended, + bowel sounds  Extremities: warm dry without cyanosis clubbing or edema  Neuro: AAOx3, nonfocal  Psych: Normal affect and demeanor with intact judgement and insight   Data Reviewed: I have personally reviewed following labs and imaging studies  CBC:  Recent Labs Lab 09/21/16 0719 09/22/16 0625  WBC 11.3* 12.8*  NEUTROABS 9.7*  --   HGB 13.0 12.4*  HCT 38.8* 37.4*  MCV 93.7 94.2  PLT 231 0000000   Basic Metabolic Panel:  Recent Labs Lab 09/21/16 0719 09/21/16 1537 09/22/16 0625  NA 140  --  138  K 4.1  --  4.3  CL 101  --  101  CO2 25  --  28  GLUCOSE 119*  --  184*  BUN 27*  --  33*  CREATININE 1.35*  --  1.42*  CALCIUM 10.3  --  9.6  MG  --  2.5*  --   PHOS  --  3.6  --    GFR: Estimated Creatinine Clearance: 47.2 mL/min (by C-G formula based on SCr of 1.42 mg/dL (H)). Liver Function Tests: No results for input(s): AST, ALT, ALKPHOS, BILITOT, PROT, ALBUMIN in the last 168 hours. No results for input(s): LIPASE, AMYLASE in the last 168 hours. No results for input(s): AMMONIA in the last 168 hours. Coagulation Profile: No results for input(s): INR, PROTIME in the last 168 hours. Cardiac Enzymes: No results for input(s): CKTOTAL, CKMB, CKMBINDEX, TROPONINI in the last 168 hours. BNP (last 3 results) No results for input(s): PROBNP in the last 8760 hours. HbA1C: No results for input(s): HGBA1C in the last 72 hours. CBG: No results for input(s): GLUCAP in the last 168 hours. Lipid Profile: No results for input(s): CHOL, HDL, LDLCALC, TRIG, CHOLHDL, LDLDIRECT in the last 72 hours. Thyroid Function Tests: No results for input(s): TSH, T4TOTAL, FREET4, T3FREE, THYROIDAB in the last 72 hours. Anemia Panel: No results for input(s):  VITAMINB12, FOLATE, FERRITIN, TIBC, IRON, RETICCTPCT in the last 72 hours. Urine analysis:    Component Value Date/Time   COLORURINE YELLOW 04/17/2013 1650   APPEARANCEUR CLEAR 04/17/2013 1650   LABSPEC 1.018 04/17/2013 1650   PHURINE 5.0 04/17/2013 1650   GLUCOSEU NEGATIVE 04/17/2013 1650   HGBUR NEGATIVE 04/17/2013 1650   BILIRUBINUR NEGATIVE 04/17/2013 1650   KETONESUR NEGATIVE 04/17/2013 1650   PROTEINUR NEGATIVE 04/17/2013 1650   UROBILINOGEN 0.2 04/17/2013 1650   NITRITE NEGATIVE 04/17/2013 1650   LEUKOCYTESUR NEGATIVE 04/17/2013 1650   Sepsis Labs: @LABRCNTIP (procalcitonin:4,lacticidven:4)  ) Recent Results (from the past 240 hour(s))  Respiratory Panel by PCR     Status: None   Collection Time: 09/21/16  9:06 AM  Result Value Ref Range Status   Adenovirus NOT DETECTED NOT DETECTED Final  Coronavirus 229E NOT DETECTED NOT DETECTED Final   Coronavirus HKU1 NOT DETECTED NOT DETECTED Final   Coronavirus NL63 NOT DETECTED NOT DETECTED Final   Coronavirus OC43 NOT DETECTED NOT DETECTED Final   Metapneumovirus NOT DETECTED NOT DETECTED Final   Rhinovirus / Enterovirus NOT DETECTED NOT DETECTED Final   Influenza A NOT DETECTED NOT DETECTED Final   Influenza B NOT DETECTED NOT DETECTED Final   Parainfluenza Virus 1 NOT DETECTED NOT DETECTED Final   Parainfluenza Virus 2 NOT DETECTED NOT DETECTED Final   Parainfluenza Virus 3 NOT DETECTED NOT DETECTED Final   Parainfluenza Virus 4 NOT DETECTED NOT DETECTED Final   Respiratory Syncytial Virus NOT DETECTED NOT DETECTED Final   Bordetella pertussis NOT DETECTED NOT DETECTED Final   Chlamydophila pneumoniae NOT DETECTED NOT DETECTED Final   Mycoplasma pneumoniae NOT DETECTED NOT DETECTED Final      Radiology Studies: Dg Chest 2 View  Result Date: 09/21/2016 CLINICAL DATA:  65 year old male with shortness of breath. History of COPD. Patient aspirated this Young. EXAM: CHEST  2 VIEW COMPARISON:  Chest radiograph dated  09/19/2016 FINDINGS: There is emphysematous changes of the lungs. There is no focal consolidation, pleural effusion, or pneumothorax. The cardiac silhouette is within normal limits. There is atherosclerotic calcification of the aortic arch. The aorta is somewhat tortuous. There is osteopenia with degenerative changes of the spine. No acute fracture. IMPRESSION: No active cardiopulmonary disease. Emphysema. Electronically Signed   By: Anner Crete M.D.   On: 09/21/2016 06:58     Scheduled Meds: . aspirin  325 mg Oral Daily  . atorvastatin  80 mg Oral Daily  . bisoprolol  5 mg Oral Daily  . enoxaparin (LOVENOX) injection  40 mg Subcutaneous Q24H  . ferrous sulfate  325 mg Oral Q breakfast  . furosemide  20 mg Oral Daily  . hydrALAZINE  10 mg Oral TID  . ipratropium-albuterol  3 mL Nebulization Q4H  . isosorbide dinitrate  15 mg Oral Daily  . levofloxacin (LEVAQUIN) IV  500 mg Intravenous Q24H  . methylPREDNISolone (SOLU-MEDROL) injection  60 mg Intravenous Q6H  . mometasone-formoterol  2 puff Inhalation BID  . multivitamin with minerals  1 tablet Oral Daily  . roflumilast  500 mcg Oral Daily  . sodium chloride flush  3 mL Intravenous Q12H   Continuous Infusions:   LOS: 1 day   Time Spent in minutes   30 minutes  Kyleen Villatoro D.O. on 09/22/2016 at 3:07 PM  Between 7am to 7pm - Pager - [phone]  After 7pm go to [email or web address] - password [email or web address]  And look for the night coverage person covering for me after hours  [facility] Office  [phone]

## 2016-09-23 LAB — CBC
HCT: 39.8 % (ref 39.0–52.0)
Hemoglobin: 13 g/dL (ref 13.0–17.0)
MCH: 31 pg (ref 26.0–34.0)
MCHC: 32.7 g/dL (ref 30.0–36.0)
MCV: 95 fL (ref 78.0–100.0)
Platelets: 209 10*3/uL (ref 150–400)
RBC: 4.19 MIL/uL — ABNORMAL LOW (ref 4.22–5.81)
RDW: 14.6 % (ref 11.5–15.5)
WBC: 12.8 10*3/uL — AB (ref 4.0–10.5)

## 2016-09-23 LAB — BASIC METABOLIC PANEL
ANION GAP: 7 (ref 5–15)
BUN: 42 mg/dL — ABNORMAL HIGH (ref 6–20)
CO2: 32 mmol/L (ref 22–32)
CREATININE: 1.28 mg/dL — AB (ref 0.61–1.24)
Calcium: 9.3 mg/dL (ref 8.9–10.3)
Chloride: 100 mmol/L — ABNORMAL LOW (ref 101–111)
GFR calc non Af Amer: 56 mL/min — ABNORMAL LOW (ref >60)
GLUCOSE: 146 mg/dL — AB (ref 65–99)
Potassium: 4.4 mmol/L (ref 3.5–5.1)
Sodium: 139 mmol/L (ref 135–145)

## 2016-09-23 MED ORDER — MENTHOL 3 MG MT LOZG
1.0000 | LOZENGE | OROMUCOSAL | Status: DC | PRN
  Filled 2016-09-23: qty 9

## 2016-09-23 MED ORDER — METHYLPREDNISOLONE SODIUM SUCC 125 MG IJ SOLR
60.0000 mg | Freq: Two times a day (BID) | INTRAMUSCULAR | Status: DC
  Administered 2016-09-23 – 2016-09-28 (×10): 60 mg via INTRAVENOUS
  Filled 2016-09-23 (×10): qty 2

## 2016-09-23 NOTE — Plan of Care (Signed)
Problem: Bowel/Gastric: Goal: Will not experience complications related to bowel motility Outcome: Progressing No gastric or bowel issues noted  Problem: Health Behavior/Discharge Planning: Goal: Ability to manage health-related needs will improve Outcome: Progressing No fall or injury noted this shift  Problem: Pain Managment: Goal: General experience of comfort will improve Outcome: Progressing Medicated for pain with moderate relief  Problem: Tissue Perfusion: Goal: Risk factors for ineffective tissue perfusion will decrease Outcome: Progressing No s/s of dvt noted  Problem: Activity: Goal: Risk for activity intolerance will decrease Outcome: Progressing Ambulating in the room with mild SOB

## 2016-09-23 NOTE — Progress Notes (Signed)
PROGRESS NOTE    [name]  [id number] DOB: 1951/05/03 DOA: 09/21/2016 PCP: [name], MD   Chief Complaint  Patient presents with  . Shortness of Breath     Brief Narrative:  HPI on 09/21/2016 by [name], NP [name] is a 65 y.o. male with medical history significant for history of prior ischemic cardiomyopathy was recovered left ventricular systolic function based on most recent echocardiogram last month, hypertension, COPD on prn nocturnal oxygen as well as left upper lobe opacity and lung nodules followed by Dr. Ashok Cordia as an outpatient, normocytic anemia, chronic diastolic dysfunction, chronic pain on narcotics, known thoracoabdominal aneurysm and history of tobacco abuse. He was evaluated by his pulmonologist on 12/19 for productive cough with brown sputum, shortness of breath and was started on Levaquin and prednisone taper. He was instructed if symptoms worsened he was to percent for further medical evaluation. Patient reports over the past 24 hours he has not improved and has actually worsened and when he checked his pulse oximetry overnight it was down to 85%. He has increased his nebulizer use to every 2-3 hours with no improvement in symptoms as well. He has not eaten well in the past 24 hours and was unable to sleep. Of note his wife has developed pneumonia symptoms and has also been admitted today to the hospital for her symptoms.  Assessment & Plan   Acute exacerbation of COPD  with acute on chronic bronchitis -Presented with worsening respiratory symptoms/increased work of breathing and excessive wheezing that has not improved with increased use of nebulizers, 48 hours of oral prednisone and Levaquin -Also noted to be hypoxemic (see below) -Continue IV Solu-Medrol, levaquin, nebs, Daliresp,incentive spirometry, supplemental oxygen -Will wean solumedrol -Respiratory viral panel negative -Follows up with Dr. Ashok Cordia, pulmonology   Acute on chronic  respiratory failure with hypoxia  -Typically utilizes 2L prn at night -Continue supplemental O2 throughout the day.  Will attempt to wean on 12/23 if possible  Essential hypertension -Currently controlled -Continue Zebeta and hydralazine  Coronary artery disease -Continue aspirin, statin, beta blocker, nitrate  Diastolic dysfunction with chronic heart failure  -Continue lasix -Currently appears to be euvolemic  -monitor intake/output, daily weights  Normocytic anemia -Hemoglobin stable, 13.0 -Continue iron -monitor CBC  Chronic pain disorder/anxiety disorder -Continue home morphine and Xanax  DVT Prophylaxis  Lovenox  Code Status: Full  Family Communication: None at bedside  Disposition Plan: admitted. Home when stable  Consultants None  Procedures  none  Antibiotics   Anti-infectives    Start     Dose/Rate Route Frequency Ordered Stop   09/21/16 1000  levofloxacin (LEVAQUIN) IVPB 500 mg     500 mg 100 mL/hr over 60 Minutes Intravenous Every 24 hours 09/21/16 0911        Subjective:   Rachard Krupka seen and examined today.  Patient feels his breathing has improved, however not back to his baseline.  He feels he gets short of breath with movement which he states his normal.  Denies chest pain, abdominal pain, N/V/D/C, dizziness, headache.   Objective:   Vitals:   09/22/16 2100 09/23/16 0410 09/23/16 0422 09/23/16 0834  BP:  (!) 155/69    Pulse:  70    Resp:  18    Temp:  97.6 F (36.4 C)    TempSrc:  Oral    SpO2: 97% 100% 100% 97%  Weight:      Height:        Intake/Output Summary (Last  24 hours) at 09/23/16 1216 Last data filed at 09/22/16 1803  Gross per 24 hour  Intake              480 ml  Output              375 ml  Net              105 ml   Filed Weights   09/21/16 0558  Weight: 74.4 kg (164 lb)    Exam  General: Well developed, well nourished, NAD, appears stated age  HEENT: NCAT, mucous membranes moist.   Cardiovascular: S1  S2 auscultated, RRR  Respiratory: Diminished, coarse breath sounds. Diffuse expiratory wheezing.  Abdomen: Soft, nontender, nondistended, + bowel sounds  Extremities: warm dry without cyanosis clubbing or edema  Neuro: AAOx3, nonfocal  Psych: Normal affect and demeanor with intact judgement and insight, pleasant  Data Reviewed: I have personally reviewed following labs and imaging studies  CBC:  Recent Labs Lab 09/21/16 0719 09/22/16 0625 09/23/16 0603  WBC 11.3* 12.8* 12.8*  NEUTROABS 9.7*  --   --   HGB 13.0 12.4* 13.0  HCT 38.8* 37.4* 39.8  MCV 93.7 94.2 95.0  PLT 231 204 [id number]   Basic Metabolic Panel:  Recent Labs Lab 09/21/16 0719 09/21/16 1537 09/22/16 0625 09/23/16 0603  NA 140  --  138 139  K 4.1  --  4.3 4.4  CL 101  --  101 100*  CO2 25  --  28 32  GLUCOSE 119*  --  184* 146*  BUN 27*  --  33* 42*  CREATININE 1.35*  --  1.42* 1.28*  CALCIUM 10.3  --  9.6 9.3  MG  --  2.5*  --   --   PHOS  --  3.6  --   --    GFR: Estimated Creatinine Clearance: 52.4 mL/min (by C-G formula based on SCr of 1.28 mg/dL (H)). Liver Function Tests: No results for input(s): AST, ALT, ALKPHOS, BILITOT, PROT, ALBUMIN in the last 168 hours. No results for input(s): LIPASE, AMYLASE in the last 168 hours. No results for input(s): AMMONIA in the last 168 hours. Coagulation Profile: No results for input(s): INR, PROTIME in the last 168 hours. Cardiac Enzymes: No results for input(s): CKTOTAL, CKMB, CKMBINDEX, TROPONINI in the last 168 hours. BNP (last 3 results) No results for input(s): PROBNP in the last 8760 hours. HbA1C: No results for input(s): HGBA1C in the last 72 hours. CBG: No results for input(s): GLUCAP in the last 168 hours. Lipid Profile: No results for input(s): CHOL, HDL, LDLCALC, TRIG, CHOLHDL, LDLDIRECT in the last 72 hours. Thyroid Function Tests: No results for input(s): TSH, T4TOTAL, FREET4, T3FREE, THYROIDAB in the last 72 hours. Anemia Panel: No  results for input(s): VITAMINB12, FOLATE, FERRITIN, TIBC, IRON, RETICCTPCT in the last 72 hours. Urine analysis:    Component Value Date/Time   COLORURINE YELLOW 04/17/2013 1650   APPEARANCEUR CLEAR 04/17/2013 1650   LABSPEC 1.018 04/17/2013 1650   PHURINE 5.0 04/17/2013 1650   GLUCOSEU NEGATIVE 04/17/2013 1650   HGBUR NEGATIVE 04/17/2013 1650   BILIRUBINUR NEGATIVE 04/17/2013 1650   KETONESUR NEGATIVE 04/17/2013 1650   PROTEINUR NEGATIVE 04/17/2013 1650   UROBILINOGEN 0.2 04/17/2013 1650   NITRITE NEGATIVE 04/17/2013 1650   LEUKOCYTESUR NEGATIVE 04/17/2013 1650   Sepsis Labs: @LABRCNTIP (procalcitonin:4,lacticidven:4)  ) Recent Results (from the past 240 hour(s))  Respiratory Panel by PCR     Status: None   Collection Time: 09/21/16  9:06 AM  Result Value Ref Range Status   Adenovirus NOT DETECTED NOT DETECTED Final   Coronavirus 229E NOT DETECTED NOT DETECTED Final   Coronavirus HKU1 NOT DETECTED NOT DETECTED Final   Coronavirus NL63 NOT DETECTED NOT DETECTED Final   Coronavirus OC43 NOT DETECTED NOT DETECTED Final   Metapneumovirus NOT DETECTED NOT DETECTED Final   Rhinovirus / Enterovirus NOT DETECTED NOT DETECTED Final   Influenza A NOT DETECTED NOT DETECTED Final   Influenza B NOT DETECTED NOT DETECTED Final   Parainfluenza Virus 1 NOT DETECTED NOT DETECTED Final   Parainfluenza Virus 2 NOT DETECTED NOT DETECTED Final   Parainfluenza Virus 3 NOT DETECTED NOT DETECTED Final   Parainfluenza Virus 4 NOT DETECTED NOT DETECTED Final   Respiratory Syncytial Virus NOT DETECTED NOT DETECTED Final   Bordetella pertussis NOT DETECTED NOT DETECTED Final   Chlamydophila pneumoniae NOT DETECTED NOT DETECTED Final   Mycoplasma pneumoniae NOT DETECTED NOT DETECTED Final      Radiology Studies: No results found.   Scheduled Meds: . aspirin  325 mg Oral Daily  . atorvastatin  80 mg Oral Daily  . bisoprolol  5 mg Oral Daily  . enoxaparin (LOVENOX) injection  40 mg Subcutaneous  Q24H  . ferrous sulfate  325 mg Oral Q breakfast  . furosemide  20 mg Oral Daily  . hydrALAZINE  10 mg Oral TID  . ipratropium-albuterol  3 mL Nebulization Q4H  . isosorbide dinitrate  15 mg Oral Daily  . levofloxacin (LEVAQUIN) IV  500 mg Intravenous Q24H  . methylPREDNISolone (SOLU-MEDROL) injection  60 mg Intravenous Q6H  . mometasone-formoterol  2 puff Inhalation BID  . multivitamin with minerals  1 tablet Oral Daily  . roflumilast  500 mcg Oral Daily  . sodium chloride flush  3 mL Intravenous Q12H   Continuous Infusions:   LOS: 2 days   Time Spent in minutes   30 minutes  Kaesha Kirsch D.O. on 09/23/2016 at 12:16 PM  Between 7am to 7pm - Pager - [phone]  After 7pm go to [email or web address] - password [email or web address]  And look for the night coverage person covering for me after hours  [facility] Office  [phone]

## 2016-09-24 ENCOUNTER — Inpatient Hospital Stay (HOSPITAL_COMMUNITY): Payer: Commercial Managed Care - HMO

## 2016-09-24 LAB — CBC
HCT: 37.9 % — ABNORMAL LOW (ref 39.0–52.0)
Hemoglobin: 12.6 g/dL — ABNORMAL LOW (ref 13.0–17.0)
MCH: 31 pg (ref 26.0–34.0)
MCHC: 33.2 g/dL (ref 30.0–36.0)
MCV: 93.3 fL (ref 78.0–100.0)
PLATELETS: 181 10*3/uL (ref 150–400)
RBC: 4.06 MIL/uL — ABNORMAL LOW (ref 4.22–5.81)
RDW: 14.4 % (ref 11.5–15.5)
WBC: 11.3 10*3/uL — ABNORMAL HIGH (ref 4.0–10.5)

## 2016-09-24 LAB — BASIC METABOLIC PANEL
Anion gap: 8 (ref 5–15)
BUN: 41 mg/dL — AB (ref 6–20)
CHLORIDE: 101 mmol/L (ref 101–111)
CO2: 29 mmol/L (ref 22–32)
CREATININE: 1.09 mg/dL (ref 0.61–1.24)
Calcium: 9 mg/dL (ref 8.9–10.3)
GFR calc Af Amer: >60 mL/min (ref >60)
Glucose, Bld: 151 mg/dL — ABNORMAL HIGH (ref 65–99)
Potassium: 4.2 mmol/L (ref 3.5–5.1)
SODIUM: 138 mmol/L (ref 135–145)

## 2016-09-24 NOTE — Progress Notes (Signed)
PROGRESS NOTE    [name]  [id number] DOB: 10-06-1950 DOA: 09/21/2016 PCP: [name], MD   Chief Complaint  Patient presents with  . Shortness of Breath     Brief Narrative:  HPI on 09/21/2016 by [name], NP [name] is a 65 y.o. male with medical history significant for history of prior ischemic cardiomyopathy was recovered left ventricular systolic function based on most recent echocardiogram last month, hypertension, COPD on prn nocturnal oxygen as well as left upper lobe opacity and lung nodules followed by Dr. Ashok Cordia as an outpatient, normocytic anemia, chronic diastolic dysfunction, chronic pain on narcotics, known thoracoabdominal aneurysm and history of tobacco abuse. He was evaluated by his pulmonologist on 12/19 for productive cough with brown sputum, shortness of breath and was started on Levaquin and prednisone taper. He was instructed if symptoms worsened he was to percent for further medical evaluation. Patient reports over the past 24 hours he has not improved and has actually worsened and when he checked his pulse oximetry overnight it was down to 85%. He has increased his nebulizer use to every 2-3 hours with no improvement in symptoms as well. He has not eaten well in the past 24 hours and was unable to sleep. Of note his wife has developed pneumonia symptoms and has also been admitted today to the hospital for her symptoms.  Assessment & Plan   Acute exacerbation of COPD with acute on chronic bronchitis -Presented with worsening respiratory symptoms/increased work of breathing and excessive wheezing that has not improved with increased use of nebulizers, 48 hours of oral prednisone and Levaquin -Also noted to be hypoxemic (see below) -Continue IV Solu-Medrol, levaquin, nebs, Daliresp,incentive spirometry, supplemental oxygen -Very slow to improve -Respiratory viral panel negative -Follows up with Dr. Ashok Cordia, pulmonology  -Repeat CXR  today  Acute on chronic respiratory failure with hypoxia  -Typically utilizes 2L prn at night -Continue supplemental O2 throughout the day.  Will attempt to wean on 12/23 if possible  Essential hypertension -Currently controlled -Continue Zebeta and hydralazine  Coronary artery disease -Continue aspirin, statin, beta blocker, nitrate  Diastolic dysfunction with chronic heart failure  -Continue lasix -Currently appears to be euvolemic  -monitor intake/output, daily weights  Normocytic anemia -Hemoglobin stable, 12.6 -Continue iron -monitor CBC  Chronic pain disorder/anxiety disorder -Continue home morphine and Xanax  Tobacco abuse -States last use was 3 weeks ago  DVT Prophylaxis  Lovenox  Code Status: Full  Family Communication: None at bedside  Disposition Plan: admitted. Home when stable  Consultants None  Procedures  none  Antibiotics   Anti-infectives    Start     Dose/Rate Route Frequency Ordered Stop   09/21/16 1000  levofloxacin (LEVAQUIN) IVPB 500 mg     500 mg 100 mL/hr over 60 Minutes Intravenous Every 24 hours 09/21/16 0911        Subjective:   [name] seen and examined today.  Patient feels his breathing has improved- but continues to feel short of breath and wheeze.  States he is not close to his baseline.  Denies chest pain, abdominal pain, N/V/D/C, dizziness, headache.   Objective:   Vitals:   09/24/16 0417 09/24/16 0543 09/24/16 0737 09/24/16 1145  BP:  (!) 157/70    Pulse:  77    Resp:      Temp:  97.8 F (36.6 C)    TempSrc:  Oral    SpO2: 100% 100% 96% 98%  Weight:      Height:  Intake/Output Summary (Last 24 hours) at 09/24/16 1227 Last data filed at 09/24/16 [id number]  Gross per 24 hour  Intake             1000 ml  Output              950 ml  Net               50 ml   Filed Weights   09/21/16 0558  Weight: 74.4 kg (164 lb)    Exam  General: Well developed, well nourished, No distress  HEENT: NCAT,  mucous membranes moist.   Cardiovascular: S1 S2 auscultated, RRR  Respiratory: Diminished, coarse breath sounds. Diffuse expiratory wheezing.  Abdomen: Soft, nontender, nondistended, + bowel sounds  Extremities: warm dry without cyanosis clubbing or edema  Neuro: AAOx3, nonfocal  Psych: Normal affect and demeanor with intact judgement and insight, pleasant  Data Reviewed: I have personally reviewed following labs and imaging studies  CBC:  Recent Labs Lab 09/21/16 0719 09/22/16 0625 09/23/16 0603 09/24/16 0637  WBC 11.3* 12.8* 12.8* 11.3*  NEUTROABS 9.7*  --   --   --   HGB 13.0 12.4* 13.0 12.6*  HCT 38.8* 37.4* 39.8 37.9*  MCV 93.7 94.2 95.0 93.3  PLT 231 204 209 0000000   Basic Metabolic Panel:  Recent Labs Lab 09/21/16 0719 09/21/16 1537 09/22/16 0625 09/23/16 0603 09/24/16 0637  NA 140  --  138 139 138  K 4.1  --  4.3 4.4 4.2  CL 101  --  101 100* 101  CO2 25  --  28 32 29  GLUCOSE 119*  --  184* 146* 151*  BUN 27*  --  33* 42* 41*  CREATININE 1.35*  --  1.42* 1.28* 1.09  CALCIUM 10.3  --  9.6 9.3 9.0  MG  --  2.5*  --   --   --   PHOS  --  3.6  --   --   --    GFR: Estimated Creatinine Clearance: 61.5 mL/min (by C-G formula based on SCr of 1.09 mg/dL). Liver Function Tests: No results for input(s): AST, ALT, ALKPHOS, BILITOT, PROT, ALBUMIN in the last 168 hours. No results for input(s): LIPASE, AMYLASE in the last 168 hours. No results for input(s): AMMONIA in the last 168 hours. Coagulation Profile: No results for input(s): INR, PROTIME in the last 168 hours. Cardiac Enzymes: No results for input(s): CKTOTAL, CKMB, CKMBINDEX, TROPONINI in the last 168 hours. BNP (last 3 results) No results for input(s): PROBNP in the last 8760 hours. HbA1C: No results for input(s): HGBA1C in the last 72 hours. CBG: No results for input(s): GLUCAP in the last 168 hours. Lipid Profile: No results for input(s): CHOL, HDL, LDLCALC, TRIG, CHOLHDL, LDLDIRECT in the last  72 hours. Thyroid Function Tests: No results for input(s): TSH, T4TOTAL, FREET4, T3FREE, THYROIDAB in the last 72 hours. Anemia Panel: No results for input(s): VITAMINB12, FOLATE, FERRITIN, TIBC, IRON, RETICCTPCT in the last 72 hours. Urine analysis:    Component Value Date/Time   COLORURINE YELLOW 04/17/2013 1650   APPEARANCEUR CLEAR 04/17/2013 1650   LABSPEC 1.018 04/17/2013 1650   PHURINE 5.0 04/17/2013 1650   GLUCOSEU NEGATIVE 04/17/2013 1650   HGBUR NEGATIVE 04/17/2013 1650   BILIRUBINUR NEGATIVE 04/17/2013 1650   KETONESUR NEGATIVE 04/17/2013 1650   PROTEINUR NEGATIVE 04/17/2013 1650   UROBILINOGEN 0.2 04/17/2013 1650   NITRITE NEGATIVE 04/17/2013 1650   LEUKOCYTESUR NEGATIVE 04/17/2013 1650   Sepsis Labs: @LABRCNTIP (procalcitonin:4,lacticidven:4)  )  Recent Results (from the past 240 hour(s))  Respiratory Panel by PCR     Status: None   Collection Time: 09/21/16  9:06 AM  Result Value Ref Range Status   Adenovirus NOT DETECTED NOT DETECTED Final   Coronavirus 229E NOT DETECTED NOT DETECTED Final   Coronavirus HKU1 NOT DETECTED NOT DETECTED Final   Coronavirus NL63 NOT DETECTED NOT DETECTED Final   Coronavirus OC43 NOT DETECTED NOT DETECTED Final   Metapneumovirus NOT DETECTED NOT DETECTED Final   Rhinovirus / Enterovirus NOT DETECTED NOT DETECTED Final   Influenza A NOT DETECTED NOT DETECTED Final   Influenza B NOT DETECTED NOT DETECTED Final   Parainfluenza Virus 1 NOT DETECTED NOT DETECTED Final   Parainfluenza Virus 2 NOT DETECTED NOT DETECTED Final   Parainfluenza Virus 3 NOT DETECTED NOT DETECTED Final   Parainfluenza Virus 4 NOT DETECTED NOT DETECTED Final   Respiratory Syncytial Virus NOT DETECTED NOT DETECTED Final   Bordetella pertussis NOT DETECTED NOT DETECTED Final   Chlamydophila pneumoniae NOT DETECTED NOT DETECTED Final   Mycoplasma pneumoniae NOT DETECTED NOT DETECTED Final      Radiology Studies: No results found.   Scheduled Meds: .  aspirin  325 mg Oral Daily  . atorvastatin  80 mg Oral Daily  . bisoprolol  5 mg Oral Daily  . enoxaparin (LOVENOX) injection  40 mg Subcutaneous Q24H  . ferrous sulfate  325 mg Oral Q breakfast  . furosemide  20 mg Oral Daily  . hydrALAZINE  10 mg Oral TID  . ipratropium-albuterol  3 mL Nebulization Q4H  . isosorbide dinitrate  15 mg Oral Daily  . levofloxacin (LEVAQUIN) IV  500 mg Intravenous Q24H  . methylPREDNISolone (SOLU-MEDROL) injection  60 mg Intravenous Q12H  . mometasone-formoterol  2 puff Inhalation BID  . multivitamin with minerals  1 tablet Oral Daily  . roflumilast  500 mcg Oral Daily  . sodium chloride flush  3 mL Intravenous Q12H   Continuous Infusions:   LOS: 3 days   Time Spent in minutes   30 minutes  Latanga Nedrow D.O. on 09/24/2016 at 12:27 PM  Between 7am to 7pm - Pager - [phone]  After 7pm go to [email or web address] - password [email or web address]  And look for the night coverage person covering for me after hours  [facility] Office  [phone]

## 2016-09-25 LAB — BASIC METABOLIC PANEL
Anion gap: 11 (ref 5–15)
BUN: 37 mg/dL — ABNORMAL HIGH (ref 6–20)
CALCIUM: 9.3 mg/dL (ref 8.9–10.3)
CHLORIDE: 96 mmol/L — AB (ref 101–111)
CO2: 32 mmol/L (ref 22–32)
CREATININE: 1.18 mg/dL (ref 0.61–1.24)
GFR calc Af Amer: >60 mL/min (ref >60)
GFR calc non Af Amer: >60 mL/min (ref >60)
GLUCOSE: 188 mg/dL — AB (ref 65–99)
Potassium: 4.8 mmol/L (ref 3.5–5.1)
Sodium: 139 mmol/L (ref 135–145)

## 2016-09-25 LAB — CBC
HCT: 40.3 % (ref 39.0–52.0)
Hemoglobin: 13.2 g/dL (ref 13.0–17.0)
MCH: 30.8 pg (ref 26.0–34.0)
MCHC: 32.8 g/dL (ref 30.0–36.0)
MCV: 94.2 fL (ref 78.0–100.0)
PLATELETS: 188 10*3/uL (ref 150–400)
RBC: 4.28 MIL/uL (ref 4.22–5.81)
RDW: 14.6 % (ref 11.5–15.5)
WBC: 13.7 10*3/uL — ABNORMAL HIGH (ref 4.0–10.5)

## 2016-09-25 MED ORDER — FUROSEMIDE 10 MG/ML IJ SOLN
20.0000 mg | Freq: Once | INTRAMUSCULAR | Status: AC
  Administered 2016-09-25: 10 mg via INTRAVENOUS
  Filled 2016-09-25: qty 2

## 2016-09-25 MED ORDER — FUROSEMIDE 10 MG/ML IJ SOLN
INTRAMUSCULAR | Status: AC
  Administered 2016-09-25: 10 mg
  Filled 2016-09-25: qty 2

## 2016-09-25 NOTE — Progress Notes (Signed)
PROGRESS NOTE    [name]  [id number] DOB: 1950-10-19 DOA: 09/21/2016 PCP: Jason Martinique, MD   Chief Complaint  Patient presents with  . Shortness of Breath     Brief Narrative:  HPI on 09/21/2016 by Ms. Jason Hearing, NP [name] is a 65 y.o. male with medical history significant for history of prior ischemic cardiomyopathy was recovered left ventricular systolic function based on most recent echocardiogram last month, hypertension, COPD on prn nocturnal oxygen as well as left upper lobe opacity and lung nodules followed by [name] as an outpatient, normocytic anemia, chronic diastolic dysfunction, chronic pain on narcotics, known thoracoabdominal aneurysm and history of tobacco abuse. He was evaluated by his pulmonologist on 12/19 for productive cough with brown sputum, shortness of breath and was started on Levaquin and prednisone taper. He was instructed if symptoms worsened he was to percent for further medical evaluation. Patient reports over the past 24 hours he has not improved and has actually worsened and when he checked his pulse oximetry overnight it was down to 85%. He has increased his nebulizer use to every 2-3 hours with no improvement in symptoms as well. He has not eaten well in the past 24 hours and was unable to sleep. Of note his wife has developed pneumonia symptoms and has also been admitted today to the hospital for her symptoms.  Assessment & Plan   Acute exacerbation of COPD with acute on chronic bronchitis -Presented with worsening respiratory symptoms/increased work of breathing and excessive wheezing that has not improved with increased use of nebulizers, 48 hours of oral prednisone and Levaquin -Also noted to be hypoxemic (see below) -Continue IV Solu-Medrol, levaquin, nebs, Daliresp, incentive spirometry, supplemental oxygen -Very slow to improve -Respiratory viral panel negative -Follows up with [name], pulmonology  -Repeat CXR  COPD/bronchtiis  Acute on chronic respiratory failure with hypoxia  -Typically utilizes 2L prn at night -Continue supplemental O2 throughout the day.    Leukocytosis -likely secondary to steroids -Continue to monitor CBC  Essential hypertension -Currently controlled -Continue Zebeta and hydralazine  Coronary artery disease -Continue aspirin, statin, beta blocker, nitrate  Diastolic dysfunction with chronic heart failure  -Continue lasix (will give additional dose today) -Currently appears to be euvolemic  -monitor intake/output, daily weights  Normocytic anemia -Hemoglobin stable, 13.2 -Continue iron -monitor CBC  Chronic pain disorder/anxiety disorder -Continue home morphine and Xanax  Tobacco abuse -States last use was 3 weeks ago  DVT Prophylaxis  Lovenox  Code Status: Full  Family Communication: None at bedside  Disposition Plan: admitted. Home when stable  Consultants None  Procedures  none  Antibiotics   Anti-infectives    Start     Dose/Rate Route Frequency Ordered Stop   09/21/16 1000  levofloxacin (LEVAQUIN) IVPB 500 mg     500 mg 100 mL/hr over 60 Minutes Intravenous Every 24 hours 09/21/16 0911        Subjective:   [name] seen and examined today.  Patient feels his breathing has improved- but not back to baseline.  Has shortness of breath with exertion.  Denies chest pain, abdominal pain, N/V/D/C, dizziness, headache.   Objective:   Vitals:   09/24/16 1441 09/24/16 1717 09/24/16 1934 09/25/16 0451  BP: (!) 142/71  (!) 141/68 138/71  Pulse:   75 71  Resp: (!) 70  17 17  Temp: 98 F (36.7 C)  98.3 F (36.8 C) 97.5 F (36.4 C)  TempSrc:   Oral Oral  SpO2: 98%  98% 96% 99%  Weight:    72.2 kg (159 lb 2.8 oz)  Height:        Intake/Output Summary (Last 24 hours) at 09/25/16 1019 Last data filed at 09/24/16 2200  Gross per 24 hour  Intake              103 ml  Output                0 ml  Net              103 ml   Filed  Weights   09/21/16 0558 09/25/16 0451  Weight: 74.4 kg (164 lb) 72.2 kg (159 lb 2.8 oz)    Exam  General: Well developed, well nourished, No distress  HEENT: NCAT, mucous membranes moist.   Cardiovascular: S1 S2 auscultated, RRR  Respiratory: Diminished, coarse breath sounds. Diffuse expiratory wheezing.  Abdomen: Soft, nontender, nondistended, + bowel sounds  Extremities: warm dry without cyanosis clubbing. +LE edema  Neuro: AAOx3, nonfocal  Psych: Normal affect and demeanor with intact judgement and insight, pleasant  Data Reviewed: I have personally reviewed following labs and imaging studies  CBC:  Recent Labs Lab 09/21/16 0719 09/22/16 0625 09/23/16 0603 09/24/16 0637 09/25/16 0917  WBC 11.3* 12.8* 12.8* 11.3* 13.7*  NEUTROABS 9.7*  --   --   --   --   HGB 13.0 12.4* 13.0 12.6* 13.2  HCT 38.8* 37.4* 39.8 37.9* 40.3  MCV 93.7 94.2 95.0 93.3 94.2  PLT 231 204 209 181 0000000   Basic Metabolic Panel:  Recent Labs Lab 09/21/16 0719 09/21/16 1537 09/22/16 0625 09/23/16 0603 09/24/16 0637  NA 140  --  138 139 138  K 4.1  --  4.3 4.4 4.2  CL 101  --  101 100* 101  CO2 25  --  28 32 29  GLUCOSE 119*  --  184* 146* 151*  BUN 27*  --  33* 42* 41*  CREATININE 1.35*  --  1.42* 1.28* 1.09  CALCIUM 10.3  --  9.6 9.3 9.0  MG  --  2.5*  --   --   --   PHOS  --  3.6  --   --   --    GFR: Estimated Creatinine Clearance: 61.5 mL/min (by C-G formula based on SCr of 1.09 mg/dL). Liver Function Tests: No results for input(s): AST, ALT, ALKPHOS, BILITOT, PROT, ALBUMIN in the last 168 hours. No results for input(s): LIPASE, AMYLASE in the last 168 hours. No results for input(s): AMMONIA in the last 168 hours. Coagulation Profile: No results for input(s): INR, PROTIME in the last 168 hours. Cardiac Enzymes: No results for input(s): CKTOTAL, CKMB, CKMBINDEX, TROPONINI in the last 168 hours. BNP (last 3 results) No results for input(s): PROBNP in the last 8760  hours. HbA1C: No results for input(s): HGBA1C in the last 72 hours. CBG: No results for input(s): GLUCAP in the last 168 hours. Lipid Profile: No results for input(s): CHOL, HDL, LDLCALC, TRIG, CHOLHDL, LDLDIRECT in the last 72 hours. Thyroid Function Tests: No results for input(s): TSH, T4TOTAL, FREET4, T3FREE, THYROIDAB in the last 72 hours. Anemia Panel: No results for input(s): VITAMINB12, FOLATE, FERRITIN, TIBC, IRON, RETICCTPCT in the last 72 hours. Urine analysis:    Component Value Date/Time   COLORURINE YELLOW 04/17/2013 1650   APPEARANCEUR CLEAR 04/17/2013 1650   LABSPEC 1.018 04/17/2013 1650   PHURINE 5.0 04/17/2013 1650   GLUCOSEU NEGATIVE 04/17/2013 1650   HGBUR NEGATIVE 04/17/2013 1650  BILIRUBINUR NEGATIVE 04/17/2013 [place] 04/17/2013 1650   PROTEINUR NEGATIVE 04/17/2013 1650   UROBILINOGEN 0.2 04/17/2013 1650   NITRITE NEGATIVE 04/17/2013 1650   LEUKOCYTESUR NEGATIVE 04/17/2013 1650   Sepsis Labs: @LABRCNTIP (procalcitonin:4,lacticidven:4)  ) Recent Results (from the past 240 hour(s))  Respiratory Panel by PCR     Status: None   Collection Time: 09/21/16  9:06 AM  Result Value Ref Range Status   Adenovirus NOT DETECTED NOT DETECTED Final   Coronavirus 229E NOT DETECTED NOT DETECTED Final   Coronavirus HKU1 NOT DETECTED NOT DETECTED Final   Coronavirus NL63 NOT DETECTED NOT DETECTED Final   Coronavirus OC43 NOT DETECTED NOT DETECTED Final   Metapneumovirus NOT DETECTED NOT DETECTED Final   Rhinovirus / Enterovirus NOT DETECTED NOT DETECTED Final   Influenza A NOT DETECTED NOT DETECTED Final   Influenza B NOT DETECTED NOT DETECTED Final   Parainfluenza Virus 1 NOT DETECTED NOT DETECTED Final   Parainfluenza Virus 2 NOT DETECTED NOT DETECTED Final   Parainfluenza Virus 3 NOT DETECTED NOT DETECTED Final   Parainfluenza Virus 4 NOT DETECTED NOT DETECTED Final   Respiratory Syncytial Virus NOT DETECTED NOT DETECTED Final   Bordetella  pertussis NOT DETECTED NOT DETECTED Final   Chlamydophila pneumoniae NOT DETECTED NOT DETECTED Final   Mycoplasma pneumoniae NOT DETECTED NOT DETECTED Final      Radiology Studies: Dg Chest 2 View  Result Date: 09/24/2016 CLINICAL DATA:  Sob/hx of copd EXAM: CHEST  2 VIEW COMPARISON:  09/21/2016 FINDINGS: Hyperinflation. Moderate thoracic spondylosis. Numerous leads and wires project over the chest. Midline trachea. Mild cardiomegaly. Atherosclerosis in the transverse aorta. Pulmonary artery enlargement. No pleural effusion or pneumothorax. Diffuse peribronchial thickening. No lobar consolidation. IMPRESSION: Hyperinflation and interstitial thickening, most consistent with COPD/ chronic bronchitis. Cardiomegaly. Pulmonary artery enlargement suggests pulmonary arterial hypertension. Aortic atherosclerosis. Electronically Signed   By: Abigail Miyamoto M.D.   On: 09/24/2016 17:01     Scheduled Meds: . furosemide      . aspirin  325 mg Oral Daily  . atorvastatin  80 mg Oral Daily  . bisoprolol  5 mg Oral Daily  . enoxaparin (LOVENOX) injection  40 mg Subcutaneous Q24H  . ferrous sulfate  325 mg Oral Q breakfast  . hydrALAZINE  10 mg Oral TID  . ipratropium-albuterol  3 mL Nebulization Q4H  . isosorbide dinitrate  15 mg Oral Daily  . levofloxacin (LEVAQUIN) IV  500 mg Intravenous Q24H  . methylPREDNISolone (SOLU-MEDROL) injection  60 mg Intravenous Q12H  . mometasone-formoterol  2 puff Inhalation BID  . multivitamin with minerals  1 tablet Oral Daily  . roflumilast  500 mcg Oral Daily  . sodium chloride flush  3 mL Intravenous Q12H   Continuous Infusions:   LOS: 4 days   Time Spent in minutes   30 minutes  Lenardo Westwood D.O. on 09/25/2016 at 10:19 AM  Between 7am to 7pm - Pager - [phone]  After 7pm go to [email or web address] - password [email or web address]  And look for the night coverage person covering for me after hours  [facility] Office  [phone]

## 2016-09-26 DIAGNOSIS — J209 Acute bronchitis, unspecified: Secondary | ICD-10-CM

## 2016-09-26 DIAGNOSIS — J441 Chronic obstructive pulmonary disease with (acute) exacerbation: Secondary | ICD-10-CM

## 2016-09-26 DIAGNOSIS — J9621 Acute and chronic respiratory failure with hypoxia: Secondary | ICD-10-CM

## 2016-09-26 LAB — BASIC METABOLIC PANEL
Anion gap: 7 (ref 5–15)
BUN: 38 mg/dL — AB (ref 6–20)
CALCIUM: 8.9 mg/dL (ref 8.9–10.3)
CO2: 31 mmol/L (ref 22–32)
CREATININE: 1.24 mg/dL (ref 0.61–1.24)
Chloride: 97 mmol/L — ABNORMAL LOW (ref 101–111)
GFR calc Af Amer: >60 mL/min (ref >60)
GFR calc non Af Amer: 58 mL/min — ABNORMAL LOW (ref >60)
GLUCOSE: 214 mg/dL — AB (ref 65–99)
Potassium: 4.6 mmol/L (ref 3.5–5.1)
Sodium: 135 mmol/L (ref 135–145)

## 2016-09-26 LAB — CBC
HEMATOCRIT: 38.8 % — AB (ref 39.0–52.0)
Hemoglobin: 12.9 g/dL — ABNORMAL LOW (ref 13.0–17.0)
MCH: 31.1 pg (ref 26.0–34.0)
MCHC: 33.2 g/dL (ref 30.0–36.0)
MCV: 93.5 fL (ref 78.0–100.0)
PLATELETS: 150 10*3/uL (ref 150–400)
RBC: 4.15 MIL/uL — ABNORMAL LOW (ref 4.22–5.81)
RDW: 14.6 % (ref 11.5–15.5)
WBC: 10.5 10*3/uL (ref 4.0–10.5)

## 2016-09-26 MED ORDER — FUROSEMIDE 10 MG/ML IJ SOLN
20.0000 mg | Freq: Once | INTRAMUSCULAR | Status: AC
  Administered 2016-09-26: 20 mg via INTRAVENOUS
  Filled 2016-09-26: qty 2

## 2016-09-26 MED ORDER — ALBUTEROL SULFATE (2.5 MG/3ML) 0.083% IN NEBU: 2.5000 mg | INHALATION_SOLUTION | RESPIRATORY_TRACT | Status: DC | PRN

## 2016-09-26 MED ORDER — ARFORMOTEROL TARTRATE 15 MCG/2ML IN NEBU
15.0000 ug | INHALATION_SOLUTION | Freq: Two times a day (BID) | RESPIRATORY_TRACT | Status: DC
  Administered 2016-09-26 – 2016-10-02 (×12): 15 ug via RESPIRATORY_TRACT
  Filled 2016-09-26 (×12): qty 2

## 2016-09-26 MED ORDER — LEVOFLOXACIN 500 MG PO TABS
500.0000 mg | ORAL_TABLET | Freq: Every day | ORAL | Status: AC
  Administered 2016-09-27: 500 mg via ORAL
  Filled 2016-09-26: qty 1

## 2016-09-26 MED ORDER — FUROSEMIDE 20 MG PO TABS
20.0000 mg | ORAL_TABLET | Freq: Every day | ORAL | Status: DC
  Administered 2016-09-26 – 2016-10-02 (×7): 20 mg via ORAL
  Filled 2016-09-26 (×7): qty 1

## 2016-09-26 MED ORDER — BUDESONIDE 0.25 MG/2ML IN SUSP
0.2500 mg | Freq: Four times a day (QID) | RESPIRATORY_TRACT | Status: DC
  Administered 2016-09-26 – 2016-09-28 (×7): 0.25 mg via RESPIRATORY_TRACT
  Filled 2016-09-26 (×7): qty 2

## 2016-09-26 NOTE — Progress Notes (Signed)
PHARMACIST - PHYSICIAN COMMUNICATION  CONCERNING: Antibiotic IV to Oral Route Change Policy  RECOMMENDATION: This patient is receiving levaquin by the intravenous route.  Based on criteria approved by the Pharmacy and Therapeutics Committee, the antibiotic(s) is/are being converted to the equivalent oral dose form(s).   DESCRIPTION: These criteria include:  Patient being treated for a respiratory tract infection, urinary tract infection, cellulitis or clostridium difficile associated diarrhea if on metronidazole  The patient is not neutropenic and does not exhibit a GI malabsorption state  The patient is eating (either orally or via tube) and/or has been taking other orally administered medications for a least 24 hours  The patient is improving clinically and has a Tmax < 100.5  If you have questions about this conversion, please contact the Pharmacy Department  []   [phone] )  [facility] []   [phone] )  [facility] [x]   [phone] )  [facility] []   [phone] )  [facility] []   [phone] )  [place], [place] D 09/26/2016 9:03 AM

## 2016-09-26 NOTE — Consult Note (Signed)
Name: [name] MRN: [id number] DOB: 05/12/1951    ADMISSION DATE:  09/21/2016 CONSULTATION DATE:  09/26/2016  REFERRING MD :  Dr. Bernadette Hoit  CHIEF COMPLAINT:  SOB  BRIEF PATIENT DESCRIPTION: 65 year old male followed by Dr. Milinda Hirschfeld for severe COPD. Presented to the office with exacerbation 12/19. He did not improve with the initiation of steroids and antibiotics. He was admitted to [facility] 12/21 and has been slow to progress since then. Pulmonary consult 12/26.  SIGNIFICANT EVENTS  12/19 eval as outpatient for COPD exacerbation 12/21 admit for same  STUDIES:    HISTORY OF PRESENT ILLNESS:  65 year old male with past medical history as below, which is significant for severe COPD on nocturnal O2, AAA follow [facility], hypertension, and ischemic cardiomyopathy with recovered ejection fraction. He lives in an apartment with his wife. At baseline he is able to walk around the grocery store without complaints of shortness of breath. His COPD is managed with Symbicort, Atrovent, and Daliresp. He was in his usual state of health until about 12/17 when he had a worsening of his cough and started producing brown sputum. He reports that his wife had been sick for a couple days prior to him having the symptoms. He presented to the pulmonary office with these complaints and was diagnosed with COPD exacerbation. He was started on a prednisone taper, and a 7 day course of Levaquin. His symptoms proceeded to worsen over the following days he presented to the emergency department 12/21. He was admitted for COPD exacerbation and was treated in the usual fashion with IV steroids and antibiotics. Also with frequently scheduled nebulized bronchodilators. Despite these interventions he has been slow to improve, thus, PCCM has been asked to see for further evaluation.  PAST MEDICAL HISTORY :   has a past medical history of AAA (abdominal aortic aneurysm) ([place]); Anemia; Carotid stenosis; COPD (chronic  obstructive pulmonary disease) ([place]); Coronary artery disease (11/12); Dyspnea; GERD (gastroesophageal reflux disease); Hepatitis, alcoholic; History of hypokalemia; Hyperlipidemia; Hypertension; Hypomagnesemia; Ischemic cardiomyopathy; Left eye trauma; Lumbar back pain; Migraine headache; Myocardial infarction (11/12); PAD (peripheral artery disease) ([place]); Pneumonia ([place] 2012); Renal artery stenosis ([place]); Subclavian artery stenosis, left ([place]); and Visual field cut.  has a past surgical history that includes Tonsillectomy and adenoidectomy (1958); left heart catheterization with coronary angiogram (N/A, 08/11/2011); percutaneous coronary stent intervention (pci-s) (N/A, 08/11/2011); Tonsillectomy; and Cardiac catheterization. Prior to Admission medications   Medication Sig Start Date End Date Taking? Authorizing Provider  albuterol (PROVENTIL HFA;VENTOLIN HFA) 108 (90 Base) MCG/ACT inhaler Inhale 2 puffs into the lungs every 6 (six) hours as needed for wheezing or shortness of breath. 01/13/16  Yes Magdalen Spatz, NP  albuterol (PROVENTIL) (5 MG/ML) 0.5% nebulizer solution TAKE 0.5 ML(S) BY NEBULIZATION EVERY FOUR HOURS AS NEEDED Patient taking differently: Take 2.5 mg by nebulization every 4 (four) hours as needed for wheezing or shortness of breath.  09/20/16  Yes Javier Glazier, MD  ALPRAZolam Duanne Moron) 0.25 MG tablet TAKE ONE TABLET BY MOUTH THREE TIMES DAILY AS NEEDED FOR ANXIETY 06/29/16  Yes Betty G Martinique, MD  aspirin 325 MG tablet Take 325 mg by mouth daily.    Yes Historical Provider, MD  atorvastatin (LIPITOR) 80 MG tablet Take 1 tablet (80 mg total) by mouth daily. 08/18/16  Yes Lelon Perla, MD  b complex vitamins tablet Take 1 tablet by mouth daily.    Yes Historical Provider, MD  bisoprolol (ZEBETA) 5 MG tablet TAKE 1 TABLET EVERY DAY 04/25/16  Yes Betty G Martinique, MD  budesonide-formoterol [place] [facility]) 160-4.5 MCG/ACT inhaler Inhale 2 puffs into the lungs 2 (two) times daily.   Yes  Historical Provider, MD  Ferrous Sulfate (IRON) 325 (65 FE) MG TABS Take 1 tablet by mouth daily.   Yes Historical Provider, MD  furosemide (LASIX) 20 MG tablet TAKE 1 TABLET EVERY DAY 04/25/16  Yes Betty G Martinique, MD  glucose blood test strip Test once daily E11.9 01/18/16  Yes Eulas Post, MD  hydrALAZINE (APRESOLINE) 10 MG tablet TAKE 1 TABLET THREE TIMES DAILY 08/07/16  Yes Betty G Martinique, MD  ipratropium (ATROVENT) 0.02 % nebulizer solution Take 2.5 mLs (0.5 mg total) by nebulization every 4 (four) hours as needed for wheezing or shortness of breath. 08/10/16  Yes Magdalen Spatz, NP  ipratropium-albuterol (DUONEB) 0.5-2.5 (3) MG/3ML SOLN Take 3 mLs by nebulization every 6 (six) hours. Use 4 times daily x 4 days, then every 6 hours as needed. Patient taking differently: Take 3 mLs by nebulization every 6 (six) hours as needed (shortness of breath).  08/05/16  Yes Mariel Aloe, MD  isosorbide dinitrate (ISORDIL) 30 MG tablet TAKE 1/2 TABLET EVERY DAY 07/05/16  Yes Betty G Martinique, MD  KRILL OIL 1000 MG CAPS Take 1 capsule by mouth daily.   Yes Historical Provider, MD  Lancets (ACCU-CHEK SOFT TOUCH) lancets Test once daily Dx E11.9 01/18/16  Yes Eulas Post, MD  levofloxacin (LEVAQUIN) 500 MG tablet Take 1 tablet (500 mg total) by mouth daily. 09/19/16  Yes Javier Glazier, MD  morphine (MSIR) 15 MG tablet Take 1 tablet (15 mg total) by mouth 4 (four) times daily. 09/15/16  Yes Betty G Martinique, MD  multivitamin [facility]) per tablet Take 1 tablet by mouth daily.    Yes Historical Provider, MD  predniSONE (DELTASONE) 10 MG tablet Take 6 tabs by mouth for 3 days, then 5 for 3 days, 4 for 3 days, 3 for 3 days, 2 for 3 days, 1 for 3 days and stop 09/19/16  Yes Javier Glazier, MD  Respiratory Therapy Supplies (FLUTTER) DEVI Use as directed. 01/28/16  Yes Javier Glazier, MD  roflumilast (DALIRESP) 500 MCG TABS tablet Take 1 tablet (500 mcg total) by mouth daily. 08/05/16  Yes Mariel Aloe,  MD  traZODone (DESYREL) 50 MG tablet TAKE ONE-HALF TO ONE TABLET BY MOUTH AT BEDTIME AS NEEDED FOR SLEEP 07/12/16  Yes Betty G Martinique, MD  albuterol (PROVENTIL) (5 MG/ML) 0.5% nebulizer solution USE ONE-HALF ML (CC) IN NEBULIZER EVERY 4 HOURS AS NEEDED 09/21/16   Javier Glazier, MD   Allergies  Allergen Reactions  . Cephalexin Anaphylaxis    REACTION: anaphylactic shock  . Penicillins Hives    REACTION: rash Has patient had a PCN reaction causing immediate rash, facial/tongue/throat swelling, SOB or lightheadedness with hypotension:YES Has patient had a PCN reaction causing severe rash involving mucus membranes or skin necrosis: NO Has patient had a PCN reaction that required hospitalization NO Has patient had a PCN reaction occurring within the last 10 years: NO If all of the above answers are "NO", then may proceed with Cephalosporin use.  . Vancomycin     thrombocytopenia  . Beta Adrenergic Blockers Other (See Comments)    Can take bisoprolol, cannot take less selective BB due to severe copd     FAMILY HISTORY:  family history includes Cancer in his mother; Diabetes in his daughter; Heart attack in his father; Heart disease in his father; Hyperlipidemia  in his father; Hypertension in his father and sister; Throat cancer in his mother. SOCIAL HISTORY:  reports that he quit smoking about 8 weeks ago. His smoking use included Cigarettes. He has a 55.00 pack-year smoking history. He has never used smokeless tobacco. He reports that he does not drink alcohol or use drugs.  REVIEW OF SYSTEMS:   Bolds are positive  Constitutional: weight loss, gain, night sweats, Fevers, chills, fatigue .  HEENT: headaches, Sore throat, sneezing, nasal congestion, post nasal drip, Difficulty swallowing, Tooth/dental problems, visual complaints visual changes, ear ache CV:  chest pain, radiates: ,Orthopnea, PND, swelling in lower extremities, dizziness, palpitations, syncope.  GI  heartburn, indigestion,  abdominal pain, nausea, vomiting, diarrhea, change in bowel habits, loss of appetite, bloody stools.  Resp: cough, productive: , hemoptysis, dyspnea, chest pain, pleuritic.  Skin: rash or itching or icterus GU: dysuria, change in color of urine, urgency or frequency. flank pain, hematuria  MS: joint pain or swelling. decreased range of motion  Psych: change in mood or affect. depression or anxiety.  Neuro: difficulty with speech, weakness, numbness, ataxia    SUBJECTIVE:   VITAL SIGNS: Temp:  [98 F (36.7 C)-98.5 F (36.9 C)] 98.4 F (36.9 C) (12/26 1340) Pulse Rate:  [66-75] 75 (12/26 1340) Resp:  [16-18] 18 (12/26 1340) BP: (119-147)/(57-79) 119/79 (12/26 1340) SpO2:  [91 %-100 %] 98 % (12/26 1340) Weight:  [71.8 kg (158 lb 4.6 oz)] 71.8 kg (158 lb 4.6 oz) (12/26 0500)  PHYSICAL EXAMINATION: General:  Male of normal body habitus in NAD Neuro:  Alert, oriented, non-focal HEENT:  [place]/AT, PERRL, no JVD Cardiovascular:  RRR, no MRG Lungs:  Pronounced expiratory wheeze Abdomen:  SOft, non-tender, non-distended Musculoskeletal:  No acute deformity or ROM limitation Skin:  Grossly intact   Recent Labs Lab 09/24/16 0637 09/25/16 0917 09/26/16 0353  NA 138 139 135  K 4.2 4.8 4.6  CL 101 96* 97*  CO2 29 32 31  BUN 41* 37* 38*  CREATININE 1.09 1.18 1.24  GLUCOSE 151* 188* 214*    Recent Labs Lab 09/24/16 0637 09/25/16 0917 09/26/16 0353  HGB 12.6* 13.2 12.9*  HCT 37.9* 40.3 38.8*  WBC 11.3* 13.7* 10.5  PLT 181 188 150   Dg Chest 2 View  Result Date: 09/24/2016 CLINICAL DATA:  Sob/hx of copd EXAM: CHEST  2 VIEW COMPARISON:  09/21/2016 FINDINGS: Hyperinflation. Moderate thoracic spondylosis. Numerous leads and wires project over the chest. Midline trachea. Mild cardiomegaly. Atherosclerosis in the transverse aorta. Pulmonary artery enlargement. No pleural effusion or pneumothorax. Diffuse peribronchial thickening. No lobar consolidation. IMPRESSION: Hyperinflation and  interstitial thickening, most consistent with COPD/ chronic bronchitis. Cardiomegaly. Pulmonary artery enlargement suggests pulmonary arterial hypertension. Aortic atherosclerosis. Electronically Signed   By: Abigail Miyamoto M.D.   On: 09/24/2016 17:01    ASSESSMENT / PLAN:  Acute on chronic hypoxemic respiratory failure secondary to COPD with acute exacerbation. Possibly a component of pulmonary edema as well.  - Continue scheduled duonebs, daliresp - Maintain solumedrol 60mg  BID - Optimize nebulized therapy with budesonide, brovana - Add PRN albuterol - DC dulera - Agree with continued diuresis  Leukocytosis - improved, likely due to steroids - monitor  AAA, CAD, HTN - Per primary  Georgann Housekeeper, AGACNP-BC [facility] Pulmonology/Critical Care Pager [phone] or [phone]  09/26/2016 2:58 PM

## 2016-09-26 NOTE — Progress Notes (Signed)
PROGRESS NOTE    [name]  [id number] DOB: 1951/07/17 DOA: 09/21/2016 PCP: [name], MD   Chief Complaint  Patient presents with  . Shortness of Breath     Brief Narrative:  65 y.o.malewith medical history significant for history of prior ischemic cardiomyopathy was recovered left ventricular systolic function based on most recent echocardiogram last month, hypertension, COPD on prn nocturnal oxygen as well as left upper lobe opacity and lung nodules followed by Dr. Ashok Cordia as an outpatient, normocytic anemia, chronic diastolic dysfunction, chronic pain on narcotics, known thoracoabdominal aneurysm and history of tobacco abuse  Admitted for COPD exac and bronchitis. Pulmonology consulted.  Slow to improve.  Assessment & Plan   Acute exacerbation of COPD with acute on chronic bronchitis -Presented with worsening respiratory symptoms/increased work of breathing and excessive wheezing that has not improved with increased use of nebulizers, 48 hours of oral prednisone and Levaquin -Also noted to be hypoxemic (see below) -Continue IV Solu-Medrol, levaquin, nebs, Daliresp, incentive spirometry, supplemental oxygen -Very slow to improve -Respiratory viral panel negative -Follows up with Dr. Ashok Cordia, pulmonology- Dr. Ashok Cordia consulted and appreciated.   -Repeat CXR COPD/bronchitis -PT consulted   Acute on chronic respiratory failure with hypoxia  -Typically utilizes 2L prn at night -Continue supplemental O2 throughout the day.   -have asked for O2 sats to be monitored with rest and ambulation   Leukocytosis -likely secondary to steroids -Continue to monitor CBC  Essential hypertension -Currently controlled -Continue Zebeta and hydralazine  Coronary artery disease -Continue aspirin, statin, beta blocker, nitrate  Diastolic dysfunction with chronic heart failure  -Continue lasix (will give additional dose today) -Currently appears to be euvolemic  -monitor  intake/output, daily weights  Normocytic anemia -Hemoglobin stable, 12.9 -Continue iron -monitor CBC  Chronic pain disorder/anxiety disorder -Continue home morphine and Xanax  Tobacco abuse -States last use was 3 weeks ago  DVT Prophylaxis  Lovenox  Code Status: Full  Family Communication: None at bedside  Disposition Plan: admitted. Home when stable  Consultants None  Procedures  none  Antibiotics   Anti-infectives    Start     Dose/Rate Route Frequency Ordered Stop   09/27/16 1000  levofloxacin (LEVAQUIN) tablet 500 mg     500 mg Oral Daily 09/26/16 0904     09/21/16 1000  levofloxacin (LEVAQUIN) IVPB 500 mg  Status:  Discontinued     500 mg 100 mL/hr over 60 Minutes Intravenous Every 24 hours 09/21/16 0911 09/26/16 [id number]      Subjective:   [name] seen and examined today.  Patient feels his breathing has improved- but not back to baseline.  Feels cough has improved. Continues to feel shortness of breath with exertion.  Denies chest pain, abdominal pain, N/V/D/C, dizziness, headache.   Objective:   Vitals:   09/26/16 1045 09/26/16 1047 09/26/16 1050 09/26/16 1054  BP:      Pulse:      Resp:      Temp:      TempSrc:      SpO2: 99% 92% 99% 91%  Weight:      Height:        Intake/Output Summary (Last 24 hours) at 09/26/16 1147 Last data filed at 09/25/16 1802  Gross per 24 hour  Intake              480 ml  Output              200 ml  Net  280 ml   Filed Weights   09/21/16 0558 09/25/16 0451 09/26/16 0500  Weight: 74.4 kg (164 lb) 72.2 kg (159 lb 2.8 oz) 71.8 kg (158 lb 4.6 oz)    Exam  General: Well developed, well nourished, No distress  HEENT: NCAT, mucous membranes moist.   Cardiovascular: S1 S2 auscultated, RRR  Respiratory: Diminished, coarse breath sounds. Diffuse expiratory wheezing.  Abdomen: Soft, nontender, nondistended, + bowel sounds  Extremities: warm dry without cyanosis clubbing. +LE edema  Neuro: AAOx3,  nonfocal  Psych: Normal affect and demeanor with intact judgement and insight, pleasant  Data Reviewed: I have personally reviewed following labs and imaging studies  CBC:  Recent Labs Lab 09/21/16 0719 09/22/16 0625 09/23/16 0603 09/24/16 0637 09/25/16 0917 09/26/16 0353  WBC 11.3* 12.8* 12.8* 11.3* 13.7* 10.5  NEUTROABS 9.7*  --   --   --   --   --   HGB 13.0 12.4* 13.0 12.6* 13.2 12.9*  HCT 38.8* 37.4* 39.8 37.9* 40.3 38.8*  MCV 93.7 94.2 95.0 93.3 94.2 93.5  PLT 231 204 209 181 188 [id number]   Basic Metabolic Panel:  Recent Labs Lab 09/21/16 1537 09/22/16 0625 09/23/16 0603 09/24/16 0637 09/25/16 0917 09/26/16 0353  NA  --  138 139 138 139 135  K  --  4.3 4.4 4.2 4.8 4.6  CL  --  101 100* 101 96* 97*  CO2  --  28 32 29 32 31  GLUCOSE  --  184* 146* 151* 188* 214*  BUN  --  33* 42* 41* 37* 38*  CREATININE  --  1.42* 1.28* 1.09 1.18 1.24  CALCIUM  --  9.6 9.3 9.0 9.3 8.9  MG 2.5*  --   --   --   --   --   PHOS 3.6  --   --   --   --   --    GFR: Estimated Creatinine Clearance: 54 mL/min (by C-G formula based on SCr of 1.24 mg/dL). Liver Function Tests: No results for input(s): AST, ALT, ALKPHOS, BILITOT, PROT, ALBUMIN in the last 168 hours. No results for input(s): LIPASE, AMYLASE in the last 168 hours. No results for input(s): AMMONIA in the last 168 hours. Coagulation Profile: No results for input(s): INR, PROTIME in the last 168 hours. Cardiac Enzymes: No results for input(s): CKTOTAL, CKMB, CKMBINDEX, TROPONINI in the last 168 hours. BNP (last 3 results) No results for input(s): PROBNP in the last 8760 hours. HbA1C: No results for input(s): HGBA1C in the last 72 hours. CBG: No results for input(s): GLUCAP in the last 168 hours. Lipid Profile: No results for input(s): CHOL, HDL, LDLCALC, TRIG, CHOLHDL, LDLDIRECT in the last 72 hours. Thyroid Function Tests: No results for input(s): TSH, T4TOTAL, FREET4, T3FREE, THYROIDAB in the last 72 hours. Anemia  Panel: No results for input(s): VITAMINB12, FOLATE, FERRITIN, TIBC, IRON, RETICCTPCT in the last 72 hours. Urine analysis:    Component Value Date/Time   COLORURINE YELLOW 04/17/2013 1650   APPEARANCEUR CLEAR 04/17/2013 1650   LABSPEC 1.018 04/17/2013 1650   PHURINE 5.0 04/17/2013 1650   GLUCOSEU NEGATIVE 04/17/2013 1650   HGBUR NEGATIVE 04/17/2013 1650   BILIRUBINUR NEGATIVE 04/17/2013 1650   KETONESUR NEGATIVE 04/17/2013 1650   PROTEINUR NEGATIVE 04/17/2013 1650   UROBILINOGEN 0.2 04/17/2013 1650   NITRITE NEGATIVE 04/17/2013 1650   LEUKOCYTESUR NEGATIVE 04/17/2013 1650   Sepsis Labs: @LABRCNTIP (procalcitonin:4,lacticidven:4)  ) Recent Results (from the past 240 hour(s))  Respiratory Panel by PCR  Status: None   Collection Time: 09/21/16  9:06 AM  Result Value Ref Range Status   Adenovirus NOT DETECTED NOT DETECTED Final   Coronavirus 229E NOT DETECTED NOT DETECTED Final   Coronavirus HKU1 NOT DETECTED NOT DETECTED Final   Coronavirus NL63 NOT DETECTED NOT DETECTED Final   Coronavirus OC43 NOT DETECTED NOT DETECTED Final   Metapneumovirus NOT DETECTED NOT DETECTED Final   Rhinovirus / Enterovirus NOT DETECTED NOT DETECTED Final   Influenza A NOT DETECTED NOT DETECTED Final   Influenza B NOT DETECTED NOT DETECTED Final   Parainfluenza Virus 1 NOT DETECTED NOT DETECTED Final   Parainfluenza Virus 2 NOT DETECTED NOT DETECTED Final   Parainfluenza Virus 3 NOT DETECTED NOT DETECTED Final   Parainfluenza Virus 4 NOT DETECTED NOT DETECTED Final   Respiratory Syncytial Virus NOT DETECTED NOT DETECTED Final   Bordetella pertussis NOT DETECTED NOT DETECTED Final   Chlamydophila pneumoniae NOT DETECTED NOT DETECTED Final   Mycoplasma pneumoniae NOT DETECTED NOT DETECTED Final      Radiology Studies: Dg Chest 2 View  Result Date: 09/24/2016 CLINICAL DATA:  Sob/hx of copd EXAM: CHEST  2 VIEW COMPARISON:  09/21/2016 FINDINGS: Hyperinflation. Moderate thoracic spondylosis.  Numerous leads and wires project over the chest. Midline trachea. Mild cardiomegaly. Atherosclerosis in the transverse aorta. Pulmonary artery enlargement. No pleural effusion or pneumothorax. Diffuse peribronchial thickening. No lobar consolidation. IMPRESSION: Hyperinflation and interstitial thickening, most consistent with COPD/ chronic bronchitis. Cardiomegaly. Pulmonary artery enlargement suggests pulmonary arterial hypertension. Aortic atherosclerosis. Electronically Signed   By: Abigail Miyamoto M.D.   On: 09/24/2016 17:01     Scheduled Meds: . aspirin  325 mg Oral Daily  . atorvastatin  80 mg Oral Daily  . bisoprolol  5 mg Oral Daily  . enoxaparin (LOVENOX) injection  40 mg Subcutaneous Q24H  . ferrous sulfate  325 mg Oral Q breakfast  . furosemide  20 mg Oral Daily  . hydrALAZINE  10 mg Oral TID  . ipratropium-albuterol  3 mL Nebulization Q4H  . isosorbide dinitrate  15 mg Oral Daily  . [START ON 09/27/2016] levofloxacin  500 mg Oral Daily  . methylPREDNISolone (SOLU-MEDROL) injection  60 mg Intravenous Q12H  . mometasone-formoterol  2 puff Inhalation BID  . multivitamin with minerals  1 tablet Oral Daily  . roflumilast  500 mcg Oral Daily  . sodium chloride flush  3 mL Intravenous Q12H   Continuous Infusions:   LOS: 5 days   Time Spent in minutes   30 minutes  Chania Kochanski D.O. on 09/26/2016 at 11:47 AM  Between 7am to 7pm - Pager - [phone]  After 7pm go to [email or web address] - password [email or web address]  And look for the night coverage person covering for me after hours  [facility] Office  [phone]

## 2016-09-26 NOTE — Care Management Important Message (Signed)
Important Message  Patient Details  Name: [name] MRN: [id number] Date of Birth: 25-Dec-1950   Medicare Important Message Given:  Yes    [name] 09/26/2016, 2:12 PM

## 2016-09-27 DIAGNOSIS — R0602 Shortness of breath: Secondary | ICD-10-CM

## 2016-09-27 LAB — BASIC METABOLIC PANEL
Anion gap: 12 (ref 5–15)
BUN: 40 mg/dL — ABNORMAL HIGH (ref 6–20)
CHLORIDE: 96 mmol/L — AB (ref 101–111)
CO2: 29 mmol/L (ref 22–32)
CREATININE: 1.39 mg/dL — AB (ref 0.61–1.24)
Calcium: 9 mg/dL (ref 8.9–10.3)
GFR, EST AFRICAN AMERICAN: 59 mL/min — AB (ref >60)
GFR, EST NON AFRICAN AMERICAN: 51 mL/min — AB (ref >60)
Glucose, Bld: 233 mg/dL — ABNORMAL HIGH (ref 65–99)
Potassium: 5.2 mmol/L — ABNORMAL HIGH (ref 3.5–5.1)
SODIUM: 137 mmol/L (ref 135–145)

## 2016-09-27 LAB — CBC
HCT: 40.1 % (ref 39.0–52.0)
HEMOGLOBIN: 12.7 g/dL — AB (ref 13.0–17.0)
MCH: 30.7 pg (ref 26.0–34.0)
MCHC: 31.7 g/dL (ref 30.0–36.0)
MCV: 96.9 fL (ref 78.0–100.0)
Platelets: 140 10*3/uL — ABNORMAL LOW (ref 150–400)
RBC: 4.14 MIL/uL — ABNORMAL LOW (ref 4.22–5.81)
RDW: 15.1 % (ref 11.5–15.5)
WBC: 10.6 10*3/uL — ABNORMAL HIGH (ref 4.0–10.5)

## 2016-09-27 MED ORDER — GUAIFENESIN ER 600 MG PO TB12
600.0000 mg | ORAL_TABLET | Freq: Two times a day (BID) | ORAL | Status: DC
  Administered 2016-09-27 – 2016-09-29 (×5): 600 mg via ORAL
  Filled 2016-09-27 (×5): qty 1

## 2016-09-27 MED ORDER — DM-GUAIFENESIN ER 30-600 MG PO TB12: 1.0000 | ORAL_TABLET | Freq: Two times a day (BID) | ORAL | Status: DC

## 2016-09-27 NOTE — Progress Notes (Signed)
Cen. Tele. Called abt a run of Vtach with pt. Checked on pt and he was sitting on the side of the bed, calm, with no symptoms of vtach. Will cont. To monitor.

## 2016-09-27 NOTE — Progress Notes (Signed)
Physical Therapy Evaluation Patient Details Name: [name] MRN: [id number] DOB: April 03, 1951 Today's Date: 09/27/2016   History of Present Illness  Pt is a 65 y/o male with PMH significant for history of prior ischemic cardiomyopathy was recovered L ventricular systolic function, COPD on prn nocturnal oxygen as well as L upper lobe opacity and lung nodules, normocytic anemia, chronic diastolic dysfunction, chronic pain on narcotics, known thoracoabdominal abneurysm and history of tobacco abuse. Admitted for COPD exacerbation and bronchitis. Slow to improve.   Clinical Impression  PTA, pt was independent with all ADLs and community ambulation. Pt lives with his wife who is currently also admitted to the hospital. Pt reports that daughter who lives nearby would be able to come and check intermittently. Pt able to ambulate without assistive device on 1L O2 with min guard for safety. Pt demonstrates decreased balance from baseline and required standing rest break x1 2/2 fatigue. Pt SOB at end of session. O2 sats remained at 92-95% on 1L O2. Discussed with pt but pt declined need for RW and HHPT. Pt will continue to follow acutely to address deficits in activity tolerance and mobility before d/c to next venue.    Follow Up Recommendations No PT follow up;Supervision - Intermittent (discusssed with pt and pt declined need for HHPT)    Equipment Recommendations  None recommended by PT (Discussed with pt and pt declined need for RW)    Recommendations for Other Services       Precautions / Restrictions Precautions Precautions: Fall Restrictions Weight Bearing Restrictions: No      Mobility  Bed Mobility               General bed mobility comments: pt sitting at EOB upon PT arrival  Transfers Overall transfer level: Needs assistance   Transfers: Sit to/from Stand Sit to Stand: Supervision         General transfer comment: no physical assist required. supervision for  safety  Ambulation/Gait Ambulation/Gait assistance: Min guard Ambulation Distance (Feet): 150 Feet   Gait Pattern/deviations: Step-through pattern;Decreased stride length;Trunk flexed Gait velocity: decreased Gait velocity interpretation: Below normal speed for age/gender General Gait Details: Pt with steady gait and slight decrease in gait speed from baseline. Pt required standing rest break x1 2/2 fatigue and dizziness. Pt O2 sats stable throughout ambulation with 1L O2.  Stairs            Wheelchair Mobility    Modified Rankin (Stroke Patients Only)       Balance Overall balance assessment: Needs assistance Sitting-balance support: Feet supported;No upper extremity supported Sitting balance-Leahy Scale: Good Sitting balance - Comments: Pt able to sit EOB and call wife without assist   Standing balance support: No upper extremity supported;During functional activity Standing balance-Leahy Scale: Fair Standing balance comment: Pt able to ambulate without RW but demonstrates decreased balance from baseline reports                             Pertinent Vitals/Pain Pain Assessment: 0-10 Pain Score: 7  Pain Location: back Pain Descriptors / Indicators: Aching Pain Intervention(s): Limited activity within patient's tolerance;Monitored during session;Repositioned    Home Living Family/patient expects to be discharged to:: Private residence Living Arrangements: Spouse/significant other Available Help at Discharge: Family;Available PRN/intermittently Type of Home: Apartment Home Access: Level entry     Home Layout: One level Home Equipment: None      Prior Function Level of Independence: Independent  Hand Dominance        Extremity/Trunk Assessment   Upper Extremity Assessment Upper Extremity Assessment: Overall WFL for tasks assessed    Lower Extremity Assessment Lower Extremity Assessment: Overall WFL for tasks assessed        Communication   Communication: No difficulties  Cognition Arousal/Alertness: Awake/alert Behavior During Therapy: WFL for tasks assessed/performed Overall Cognitive Status: Within Functional Limits for tasks assessed                      General Comments      Exercises     Assessment/Plan    PT Assessment Patient needs continued PT services  PT Problem List Decreased strength;Decreased range of motion;Decreased activity tolerance;Decreased balance;Decreased mobility;Decreased knowledge of use of DME;Decreased safety awareness;Decreased knowledge of precautions;Pain          PT Treatment Interventions DME instruction;Gait training;Therapeutic activities;Therapeutic exercise;Balance training;Patient/family education    PT Goals (Current goals can be found in the Care Plan section)  Acute Rehab PT Goals Patient Stated Goal: to go home PT Goal Formulation: With patient Time For Goal Achievement: 10/11/16 Potential to Achieve Goals: Good    Frequency Min 3X/week   Barriers to discharge        Co-evaluation               End of Session Equipment Utilized During Treatment: Gait belt Activity Tolerance: Patient tolerated treatment well Patient left: in bed;with call bell/phone within reach Nurse Communication: Mobility status         Time: [id number] PT Time Calculation (min) (ACUTE ONLY): 22 min   Charges:   PT Evaluation $PT Eval Moderate Complexity: 1 Procedure     PT G Codes:        Tonia Brooms 2016/10/22, 2:01 PM Tonia Brooms, SPT [phone]

## 2016-09-27 NOTE — Progress Notes (Signed)
PROGRESS NOTE    [name]  [id number] DOB: [birth year] DOA: 09/21/2016 PCP: Jason Martinique, MD   Chief Complaint  Patient presents with  . Shortness of Breath   Brief Narrative:  65 y.o.malewith medical history significant for history of prior ischemic cardiomyopathy was recovered left ventricular systolic function based on most recent echocardiogram last month, hypertension, COPD on prn nocturnal oxygen as well as left upper lobe opacity and lung nodules followed by Dr. Ashok Cordia as an outpatient, normocytic anemia, chronic diastolic dysfunction, chronic pain on narcotics, known thoracoabdominal aneurysm and history of tobacco abuse  Admitted for COPD exac and bronchitis. Pulmonology consulted.  Slow to improve.  Assessment & Plan   Acute exacerbation of COPD with acute on chronic bronchitis -Presented with worsening respiratory symptoms/increased work of breathing and excessive wheezing that has not improved with increased use of nebulizers, 48 hours of oral prednisone and Levaquin -Also noted to be hypoxemic (see below) -Continue IV Solu-Medrol, levaquin, nebs, Daliresp, incentive spirometry, supplemental oxygen -Very slow to improve -Respiratory viral panel negative -Follows up with Dr. Ashok Cordia, pulmonology- Dr. Ashok Cordia consulted and appreciated.  See notes.  -Repeat CXR COPD/bronchitis -PT consulted : No PT follow up recommended.   Acute on chronic respiratory failure with hypoxia  -Typically utilizes 2L prn at night -Continue supplemental O2 throughout the day.   -O2 sats to be monitored with rest and ambulation   Leukocytosis -likely secondary to steroids -Continue to monitor CBC  Essential hypertension -Currently controlled -Continue Zybeta and hydralazine  Coronary artery disease -Continue aspirin, statin, beta blocker, nitrate  Diastolic dysfunction with chronic heart failure  -Continue lasix -Currently appears to be euvolemic  -monitor intake/output,  daily weights  Normocytic anemia -Hemoglobin stable, 12.9 -Continue iron -monitor CBC  Chronic pain disorder/anxiety disorder -Continue home morphine and Xanax  Tobacco abuse -States last use was 3 weeks ago  DVT Prophylaxis  Lovenox  Code Status: Full  Family Communication: None at bedside  Disposition Plan: admitted. Home when stable  Consultants None  Procedures  none  Antibiotics   Anti-infectives    Start     Dose/Rate Route Frequency Ordered Stop   09/27/16 1000  levofloxacin (LEVAQUIN) tablet 500 mg     500 mg Oral Daily 09/26/16 0904 09/27/16 0804   09/21/16 1000  levofloxacin (LEVAQUIN) IVPB 500 mg  Status:  Discontinued     500 mg 100 mL/hr over 60 Minutes Intravenous Every 24 hours 09/21/16 0911 09/26/16 [id number]      Subjective:   [name] seen and examined today.  Patient feels his breathing has improved- but not back to baseline.    Objective:   Vitals:   09/27/16 0800 09/27/16 0805 09/27/16 0827 09/27/16 1202  BP:      Pulse:      Resp:      Temp:      TempSrc:      SpO2: 98% 99% 99% 97%  Weight:      Height:        Intake/Output Summary (Last 24 hours) at 09/27/16 1422 Last data filed at 09/27/16 0800  Gross per 24 hour  Intake              720 ml  Output                0 ml  Net              720 ml   Filed Weights   09/21/16 0558 09/25/16 0451 09/26/16 0500  Weight: 74.4 kg (164 lb) 72.2 kg (159 lb 2.8 oz) 71.8 kg (158 lb 4.6 oz)    Exam  General: Well developed, well nourished, No distress  HEENT: NCAT, mucous membranes moist.   Cardiovascular: S1 S2 auscultated, RRR  Respiratory:  Diffuse wheezing.  Abdomen: Soft, nontender, nondistended, + bowel sounds  Extremities: warm dry without cyanosis clubbing. +LE edema  Neuro: AAOx3, nonfocal  Psych: Normal affect and demeanor with intact judgement and insight, pleasant  Data Reviewed: I have personally reviewed following labs and imaging studies  CBC:  Recent  Labs Lab 09/21/16 0719  09/23/16 0603 09/24/16 0637 09/25/16 0917 09/26/16 0353 09/27/16 0342  WBC 11.3*  < > 12.8* 11.3* 13.7* 10.5 10.6*  NEUTROABS 9.7*  --   --   --   --   --   --   HGB 13.0  < > 13.0 12.6* 13.2 12.9* 12.7*  HCT 38.8*  < > 39.8 37.9* 40.3 38.8* 40.1  MCV 93.7  < > 95.0 93.3 94.2 93.5 96.9  PLT 231  < > 209 181 188 150 140*  < > = values in this interval not displayed. Basic Metabolic Panel:  Recent Labs Lab 09/21/16 1537  09/23/16 0603 09/24/16 [id number] 09/25/16 0917 09/26/16 0353 09/27/16 0342  NA  --   < > 139 138 139 135 137  K  --   < > 4.4 4.2 4.8 4.6 5.2*  CL  --   < > 100* 101 96* 97* 96*  CO2  --   < > 32 29 32 31 29  GLUCOSE  --   < > 146* 151* 188* 214* 233*  BUN  --   < > 42* 41* 37* 38* 40*  CREATININE  --   < > 1.28* 1.09 1.18 1.24 1.39*  CALCIUM  --   < > 9.3 9.0 9.3 8.9 9.0  MG 2.5*  --   --   --   --   --   --   PHOS 3.6  --   --   --   --   --   --   < > = values in this interval not displayed. GFR: Estimated Creatinine Clearance: 48.2 mL/min (by C-G formula based on SCr of 1.39 mg/dL (H)). Liver Function Tests: No results for input(s): AST, ALT, ALKPHOS, BILITOT, PROT, ALBUMIN in the last 168 hours. No results for input(s): LIPASE, AMYLASE in the last 168 hours. No results for input(s): AMMONIA in the last 168 hours. Coagulation Profile: No results for input(s): INR, PROTIME in the last 168 hours. Cardiac Enzymes: No results for input(s): CKTOTAL, CKMB, CKMBINDEX, TROPONINI in the last 168 hours. BNP (last 3 results) No results for input(s): PROBNP in the last 8760 hours. HbA1C: No results for input(s): HGBA1C in the last 72 hours. CBG: No results for input(s): GLUCAP in the last 168 hours. Lipid Profile: No results for input(s): CHOL, HDL, LDLCALC, TRIG, CHOLHDL, LDLDIRECT in the last 72 hours. Thyroid Function Tests: No results for input(s): TSH, T4TOTAL, FREET4, T3FREE, THYROIDAB in the last 72 hours. Anemia Panel: No  results for input(s): VITAMINB12, FOLATE, FERRITIN, TIBC, IRON, RETICCTPCT in the last 72 hours. Urine analysis:    Component Value Date/Time   COLORURINE YELLOW 04/17/2013 1650   APPEARANCEUR CLEAR 04/17/2013 1650   LABSPEC 1.018 04/17/2013 1650   PHURINE 5.0 04/17/2013 1650   GLUCOSEU NEGATIVE 04/17/2013 1650   HGBUR NEGATIVE 04/17/2013 1650   BILIRUBINUR NEGATIVE 04/17/2013 1650   KETONESUR NEGATIVE 04/17/2013  [place] 04/17/2013 1650   UROBILINOGEN 0.2 04/17/2013 1650   NITRITE NEGATIVE 04/17/2013 1650   LEUKOCYTESUR NEGATIVE 04/17/2013 1650    Recent Results (from the past 240 hour(s))  Respiratory Panel by PCR     Status: None   Collection Time: 09/21/16  9:06 AM  Result Value Ref Range Status   Adenovirus NOT DETECTED NOT DETECTED Final   Coronavirus 229E NOT DETECTED NOT DETECTED Final   Coronavirus HKU1 NOT DETECTED NOT DETECTED Final   Coronavirus NL63 NOT DETECTED NOT DETECTED Final   Coronavirus OC43 NOT DETECTED NOT DETECTED Final   Metapneumovirus NOT DETECTED NOT DETECTED Final   Rhinovirus / Enterovirus NOT DETECTED NOT DETECTED Final   Influenza A NOT DETECTED NOT DETECTED Final   Influenza B NOT DETECTED NOT DETECTED Final   Parainfluenza Virus 1 NOT DETECTED NOT DETECTED Final   Parainfluenza Virus 2 NOT DETECTED NOT DETECTED Final   Parainfluenza Virus 3 NOT DETECTED NOT DETECTED Final   Parainfluenza Virus 4 NOT DETECTED NOT DETECTED Final   Respiratory Syncytial Virus NOT DETECTED NOT DETECTED Final   Bordetella pertussis NOT DETECTED NOT DETECTED Final   Chlamydophila pneumoniae NOT DETECTED NOT DETECTED Final   Mycoplasma pneumoniae NOT DETECTED NOT DETECTED Final      Radiology Studies: No results found.   Scheduled Meds: . arformoterol  15 mcg Nebulization BID  . aspirin  325 mg Oral Daily  . atorvastatin  80 mg Oral Daily  . bisoprolol  5 mg Oral Daily  . budesonide (PULMICORT) nebulizer solution  0.25 mg Nebulization Q6H   . enoxaparin (LOVENOX) injection  40 mg Subcutaneous Q24H  . ferrous sulfate  325 mg Oral Q breakfast  . furosemide  20 mg Oral Daily  . guaiFENesin  600 mg Oral BID  . hydrALAZINE  10 mg Oral TID  . ipratropium-albuterol  3 mL Nebulization Q4H  . isosorbide dinitrate  15 mg Oral Daily  . methylPREDNISolone (SOLU-MEDROL) injection  60 mg Intravenous Q12H  . multivitamin with minerals  1 tablet Oral Daily  . roflumilast  500 mcg Oral Daily  . sodium chloride flush  3 mL Intravenous Q12H   Continuous Infusions:   LOS: 6 days   Time Spent in minutes   24 minutes  Jatziry Wechter MD on 09/27/2016 at 2:22 PM  Between 7am to 7pm - Pager - [phone]  After 7pm go to [email or web address] - password [email or web address]  And look for the night coverage person covering for me after hours  [facility] Office  [phone]

## 2016-09-27 NOTE — Progress Notes (Signed)
   Name: [name] MRN: [id number] DOB: June 14, 1951    ADMISSION DATE:  09/21/2016 CONSULTATION DATE:  09/26/2016  REFERRING MD :  Dr. Bernadette Hoit  CHIEF COMPLAINT:  SOB  BRIEF PATIENT DESCRIPTION: 65 year old male followed by Dr. Milinda Hirschfeld for severe COPD. Presented to the office with exacerbation 12/19. He did not improve with the initiation of steroids and antibiotics. He was admitted to [facility] 12/21 for COPD exacerbation and treated with IV steroids and scheduled duonebs. He has been slow to progress since then. Pulmonary consult 12/26.  SIGNIFICANT EVENTS  12/19 eval as outpatient for COPD exacerbation 12/21 admit for same  STUDIES:   SUBJECTIVE: Not feeling much better despite escalating bronchodilators. Has chest congestion and can't "get it up"  VITAL SIGNS: Temp:  [98 F (36.7 C)-98.4 F (36.9 C)] 98.2 F (36.8 C) (12/27 0500) Pulse Rate:  [65-75] 65 (12/27 0500) Resp:  [16-18] 16 (12/27 0500) BP: (119-151)/(61-79) 142/61 (12/27 0500) SpO2:  [91 %-99 %] 99 % (12/27 0827)  PHYSICAL EXAMINATION: General:  Male of normal body habitus in NAD Neuro:  Alert, oriented, non-focal HEENT:  [place]/AT, PERRL, no JVD Cardiovascular:  RRR, no MRG Lungs:  Wheeze improved somewhat, still with coarse rhonchi.  Abdomen:  SOft, non-tender, non-distended Musculoskeletal:  No acute deformity or ROM limitation Skin:  Grossly intact   Recent Labs Lab 09/25/16 0917 09/26/16 0353 09/27/16 0342  NA 139 135 137  K 4.8 4.6 5.2*  CL 96* 97* 96*  CO2 32 31 29  BUN 37* 38* 40*  CREATININE 1.18 1.24 1.39*  GLUCOSE 188* 214* 233*    Recent Labs Lab 09/25/16 0917 09/26/16 0353 09/27/16 0342  HGB 13.2 12.9* 12.7*  HCT 40.3 38.8* 40.1  WBC 13.7* 10.5 10.6*  PLT 188 150 140*   No results found.  ASSESSMENT / PLAN:  Acute on chronic hypoxemic respiratory failure secondary to COPD with acute exacerbation. Possibly a component of pulmonary edema as well.  - Continue scheduled  duonebs,budesonide, brovana, and daliresp - Maintain solumedrol 60mg  BID - Continue levofloxacin for 7 day course - Consider more aggressive diuresis - Flutter valve and mucinex - Consider discontinuation of beta blocker, although bisoprolol is cardiac selective so may not see much benefit.   AAA, CAD, HTN - Per primary  Georgann Housekeeper, AGACNP-BC [facility] Pulmonology/Critical Care Pager [phone] or [phone]  09/27/2016 10:32 AM

## 2016-09-27 NOTE — Care Management Note (Signed)
Case Management Note  Patient Details  Name: [name] MRN: [id number] Date of Birth: 05-05-1951  Subjective/Objective: 65 y.o. M admitted 12/21 with COPD exacerbation and bronchitis. Lives at home with spouse and has Adult Daughter who lives in [place]. Wife is hospitalized and to have Cardiac Cath today per pt. [facility] Oxygen provided by [facility].                    Action/Plan:CM will continue to follow for change in discharge needs.    Expected Discharge Date:  09/25/16               Expected Discharge Plan:  Home/Self Care  In-House Referral:  NA  Discharge planning Services  CM Consult  Post Acute Care Choice:    Choice offered to:  Patient  DME Arranged:   (Has Home Oxygen prior to admission) DME Agency:  [place]:    [facility] Agency:     Status of Service:  In process, will continue to follow  If discussed at Long Length of Stay Meetings, dates discussed:    Additional Comments:  Jason Sawyers, RN 09/27/2016, 10:13 AM

## 2016-09-28 DIAGNOSIS — J441 Chronic obstructive pulmonary disease with (acute) exacerbation: Secondary | ICD-10-CM

## 2016-09-28 DIAGNOSIS — R739 Hyperglycemia, unspecified: Secondary | ICD-10-CM | POA: Diagnosis not present

## 2016-09-28 DIAGNOSIS — T380X5A Adverse effect of glucocorticoids and synthetic analogues, initial encounter: Secondary | ICD-10-CM

## 2016-09-28 LAB — BASIC METABOLIC PANEL
ANION GAP: 8 (ref 5–15)
BUN: 41 mg/dL — ABNORMAL HIGH (ref 6–20)
CALCIUM: 9 mg/dL (ref 8.9–10.3)
CO2: 31 mmol/L (ref 22–32)
CREATININE: 1.29 mg/dL — AB (ref 0.61–1.24)
Chloride: 94 mmol/L — ABNORMAL LOW (ref 101–111)
GFR calc Af Amer: >60 mL/min (ref >60)
GFR, EST NON AFRICAN AMERICAN: 56 mL/min — AB (ref >60)
GLUCOSE: 214 mg/dL — AB (ref 65–99)
Potassium: 4.8 mmol/L (ref 3.5–5.1)
Sodium: 133 mmol/L — ABNORMAL LOW (ref 135–145)

## 2016-09-28 LAB — GLUCOSE, CAPILLARY
GLUCOSE-CAPILLARY: 221 mg/dL — AB (ref 65–99)
GLUCOSE-CAPILLARY: 213 mg/dL — AB (ref 65–99)
Glucose-Capillary: 212 mg/dL — ABNORMAL HIGH (ref 65–99)

## 2016-09-28 MED ORDER — UMECLIDINIUM BROMIDE 62.5 MCG/INH IN AEPB
1.0000 | INHALATION_SPRAY | Freq: Every day | RESPIRATORY_TRACT | Status: DC
  Administered 2016-09-29 – 2016-10-02 (×4): 1 via RESPIRATORY_TRACT
  Filled 2016-09-28: qty 7

## 2016-09-28 MED ORDER — BUDESONIDE 0.25 MG/2ML IN SUSP
0.2500 mg | Freq: Two times a day (BID) | RESPIRATORY_TRACT | Status: DC
Start: 2016-09-28 — End: 2016-09-28

## 2016-09-28 MED ORDER — METHYLPREDNISOLONE SODIUM SUCC 125 MG IJ SOLR
40.0000 mg | Freq: Three times a day (TID) | INTRAMUSCULAR | Status: DC
  Administered 2016-09-28 – 2016-09-30 (×6): 40 mg via INTRAVENOUS
  Filled 2016-09-28 (×6): qty 2

## 2016-09-28 MED ORDER — BUDESONIDE 0.5 MG/2ML IN SUSP
0.5000 mg | Freq: Two times a day (BID) | RESPIRATORY_TRACT | Status: DC
  Administered 2016-09-28 – 2016-10-02 (×8): 0.5 mg via RESPIRATORY_TRACT
  Filled 2016-09-28 (×8): qty 2

## 2016-09-28 MED ORDER — INSULIN ASPART 100 UNIT/ML ~~LOC~~ SOLN
0.0000 [IU] | Freq: Three times a day (TID) | SUBCUTANEOUS | Status: DC
  Administered 2016-09-28 – 2016-09-29 (×3): 7 [IU] via SUBCUTANEOUS
  Administered 2016-09-29: 11 [IU] via SUBCUTANEOUS
  Administered 2016-09-29 – 2016-09-30 (×2): 4 [IU] via SUBCUTANEOUS
  Administered 2016-09-30 (×2): 7 [IU] via SUBCUTANEOUS
  Administered 2016-10-01 (×2): 4 [IU] via SUBCUTANEOUS
  Administered 2016-10-01: 7 [IU] via SUBCUTANEOUS
  Administered 2016-10-02: 3 [IU] via SUBCUTANEOUS
  Administered 2016-10-02: 2 [IU] via SUBCUTANEOUS

## 2016-09-28 MED ORDER — INSULIN ASPART 100 UNIT/ML ~~LOC~~ SOLN
0.0000 [IU] | Freq: Every day | SUBCUTANEOUS | Status: DC
  Administered 2016-09-28 – 2016-09-29 (×2): 2 [IU] via SUBCUTANEOUS

## 2016-09-28 NOTE — Progress Notes (Addendum)
PCCM Progress Note  Admission date: 09/21/2016 Consult date: 09/26/2016 Referring provider: Dr. Ree Kida, [facility]  CC: short of breath  HPI: 65 yo male former smoker with productive cough, and dyspnea from COPD exacerbation.  Failed to improve with outpt therapy with progressive hypoxia.  PMhx of ischemic CM, HTN, COPD on home oxygen, chronic pain, thoracoabdominal aneurysm.  Studies: PFT 04/12/16 >> FEV1 1.00 (33%), FEV1% 45, TLC 6.39 (99%), DLCO 34% CT chest 08/30/16 >> centrilobular/paraseptal emphysema Echo 09/14/16 >> EF 55 to 60%, grade 1 DD, aortic root 41 mm  Cultures: Respiratory viral panel 12/21 >> negative  Subjective: Still has cough and congestion.  Feels like he is making slow progress.  Feels nebulizer medicines work better than inhalers.  Vital signs: BP (!) 152/59 (BP Location: Right Arm)   Pulse 62   Temp 97.7 F (36.5 C) (Oral)   Resp 16   Ht 5\' 7"  (1.702 m)   Wt 158 lb 4.6 oz (71.8 kg)   SpO2 99%   BMI 24.79 kg/m   Intake/output: I/O last 3 completed shifts: In: 720 [P.O.:720] Out: -   General: pleasant Neuro: normal strength HEENT: no stridor Cardiac: regular, no murmur Chest: b/l rhonchi and wheeze Abd: soft, non tender Ext: no edema Skin: multiple areas of ecchymoses   CMP Latest Ref Rng & Units 09/28/2016 09/27/2016 09/26/2016  Glucose 65 - 99 mg/dL 214(H) 233(H) 214(H)  BUN 6 - 20 mg/dL 41(H) 40(H) 38(H)  Creatinine 0.61 - 1.24 mg/dL 1.29(H) 1.39(H) 1.24  Sodium 135 - 145 mmol/L 133(L) 137 135  Potassium 3.5 - 5.1 mmol/L 4.8 5.2(H) 4.6  Chloride 101 - 111 mmol/L 94(L) 96(L) 97(L)  CO2 22 - 32 mmol/L 31 29 31   Calcium 8.9 - 10.3 mg/dL 9.0 9.0 8.9  Total Protein 6.5 - 8.1 g/dL - - -  Total Bilirubin 0.3 - 1.2 mg/dL - - -  Alkaline Phos 38 - 126 U/L - - -  AST 15 - 41 U/L - - -  ALT 17 - 63 U/L - - -    CBC Latest Ref Rng & Units 09/27/2016 09/26/2016 09/25/2016  WBC 4.0 - 10.5 K/uL 10.6(H) 10.5 13.7(H)  Hemoglobin 13.0 - 17.0 g/dL  12.7(L) 12.9(L) 13.2  Hematocrit 39.0 - 52.0 % 40.1 38.8(L) 40.3  Platelets 150 - 400 K/uL 140(L) 150 188    CBG (last 3)  No results for input(s): GLUCAP in the last 72 hours.   ABG    Component Value Date/Time   PHART 7.276 (L) 04/17/2013 0204   PCO2ART 33.9 (L) 04/17/2013 0204   PO2ART 68.1 (L) 04/17/2013 0204   HCO3 30.5 (H) 09/21/2016 0734   TCO2 32 09/21/2016 0734   ACIDBASEDEF 10.2 (H) 04/17/2013 0204   O2SAT 73.0 09/21/2016 0734    Imaging: No results found.  Assessment/plan:  Acute on chronic respiratory failure from COPD exacerbation. - continue pulmicort, brovana >> likely continue these as outpt - add incruse - continue scheduled duoneb for now - continue daliresp - continue solumedrol - adjust oxygen to keep SpO2 88 to 95% - bronchial hygiene  Acute on chronic diastolic CHF. - per primary team  Steroid induced hyperglycemia. - SSI while on solumedrol  DVT prophylaxis - Lovenox SUP - not indicated Nutrition - Heart healthy diet Goals of care - full code  Chesley Mires, MD [place] 09/28/2016, 11:35 AM Pager:  [phone] After 3pm call: [phone]

## 2016-09-28 NOTE — Progress Notes (Signed)
Physical Therapy Treatment Patient Details Name: [name] MRN: [id number] DOB: 23-Jul-1951 Today's Date: 09/28/2016    History of Present Illness Pt is a 65 y/o male with PMH significant for history of prior ischemic cardiomyopathy was recovered L ventricular systolic function, COPD on prn nocturnal oxygen as well as L upper lobe opacity and lung nodules, normocytic anemia, chronic diastolic dysfunction, chronic pain on narcotics, known thoracoabdominal abneurysm and history of tobacco abuse. Admitted for COPD exacerbation and bronchitis. Slow to improve.     PT Comments    Pt demonstrates improved mobility and activity tolerance this session. Pt is able to perform gait on 1L O2 without any rest breaks required this session. O2 sats remain above 90% with all functional mobility. Pt was able to use the bathroom independently without O2 in place and sats were 92% following. Pt will benefit from performing functional activity without O2 to assess his tolerance to activity.    Follow Up Recommendations  No PT follow up;Supervision - Intermittent;Other (comment) (pt still declining any HHPT follow-up)     Equipment Recommendations  None recommended by PT;Other (comment) (pt still declining an AD)    Recommendations for Other Services       Precautions / Restrictions Precautions Precautions: Fall Restrictions Weight Bearing Restrictions: No    Mobility  Bed Mobility               General bed mobility comments: pt sitting at EOB upon PT arrival  Transfers Overall transfer level: Needs assistance   Transfers: Sit to/from Stand Sit to Stand: Supervision         General transfer comment: no physical assist required. supervision for safety  Ambulation/Gait Ambulation/Gait assistance: Modified independent (Device/Increase time);Min guard Ambulation Distance (Feet): 150 Feet   Gait Pattern/deviations: Step-through pattern;Decreased stride length;Trunk flexed Gait  velocity: decreased Gait velocity interpretation: Below normal speed for age/gender General Gait Details: Min gaurd for safety, pt steady with gait with decreased cadence and step length bilaterally. No rest breaks required this session. pt on 1L 02 during gait and o2 sats at 93% following gait.     Stairs            Wheelchair Mobility    Modified Rankin (Stroke Patients Only)       Balance Overall balance assessment: Needs assistance Sitting-balance support: Feet supported;No upper extremity supported Sitting balance-Leahy Scale: Good Sitting balance - Comments: pt sitting EOB with no difficulties   Standing balance support: No upper extremity supported;During functional activity Standing balance-Leahy Scale: Fair Standing balance comment: No assistive device used during gait                    Cognition Arousal/Alertness: Awake/alert Behavior During Therapy: WFL for tasks assessed/performed Overall Cognitive Status: Within Functional Limits for tasks assessed                      Exercises      General Comments        Pertinent Vitals/Pain Pain Assessment: No/denies pain    Home Living                      Prior Function            PT Goals (current goals can now be found in the care plan section) Acute Rehab PT Goals Patient Stated Goal: to go home Progress towards PT goals: Progressing toward goals    Frequency    Min  3X/week      PT Plan Current plan remains appropriate    Co-evaluation             End of Session Equipment Utilized During Treatment: Gait belt;Oxygen Activity Tolerance: Patient tolerated treatment well Patient left: in bed;with call bell/phone within reach     Time: 1143-1200 PT Time Calculation (min) (ACUTE ONLY): 17 min  Charges:  $Gait Training: 8-22 mins                    G Codes:      Scheryl Marten PT, DPT  [phone]  09/28/2016, 12:14 PM

## 2016-09-28 NOTE — Progress Notes (Signed)
PROGRESS NOTE    [name]  [id number] DOB: 15-Mar-1951 DOA: 09/21/2016 PCP: [name], MD   Chief Complaint  Patient presents with  . Shortness of Breath   Brief Narrative:  65 y.o.malewith medical history significant for history of prior ischemic cardiomyopathy was recovered left ventricular systolic function based on most recent echocardiogram last month, hypertension, COPD on prn nocturnal oxygen as well as left upper lobe opacity and lung nodules followed by Dr. Ashok Cordia as an outpatient, normocytic anemia, chronic diastolic dysfunction, chronic pain on narcotics, known thoracoabdominal aneurysm and history of tobacco abuse  Admitted for COPD exac and bronchitis. Pulmonology consulted.  Slow to improve.  Assessment & Plan   Acute exacerbation of COPD with acute on chronic bronchitis -Presented with worsening respiratory symptoms/increased work of breathing and excessive wheezing that has not improved with increased use of nebulizers, 48 hours of oral prednisone and Levaquin -Also noted to be hypoxemic (see below) -Continue IV Solu-Medrol, levaquin, nebs, Daliresp, incentive spirometry, supplemental oxygen -Very slow to improve -Respiratory viral panel negative -Follows up with Dr. Ashok Cordia, pulmonology- Dr. Ashok Cordia PCCM consulted and appreciated.  See notes.  -Repeat CXR COPD/bronchitis -PT consulted : No PT follow up recommended.   Acute on chronic respiratory failure with hypoxia  -Typically utilizes 2L prn at night -Continue supplemental O2 throughout the day.   -O2 sats to be monitored with rest and ambulation   Leukocytosis -likely secondary to steroids -Continue to monitor CBC  Essential hypertension -Currently controlled -Continue Zybeta and hydralazine  Coronary artery disease -Continue aspirin, statin, beta blocker, nitrate  Diastolic dysfunction with chronic heart failure  -Continue lasix -Currently appears to be euvolemic  -monitor  intake/output, daily weights  Normocytic anemia -Hemoglobin stable, 12.9 -Continue iron -monitor CBC  Steroid induced hyperglycemia - sliding scale coverage ordered.  Chronic pain disorder/anxiety disorder -Continue home morphine and Xanax  Tobacco abuse -States last use was 3 weeks ago  DVT Prophylaxis  Lovenox  Code Status: Full  Family Communication: at bedside  Disposition Plan: admitted. Home in 2-3 days  Consultants PCCM  Procedures  none  Antibiotics   Anti-infectives    Start     Dose/Rate Route Frequency Ordered Stop   09/27/16 1000  levofloxacin (LEVAQUIN) tablet 500 mg     500 mg Oral Daily 09/26/16 0904 09/27/16 0804   09/21/16 1000  levofloxacin (LEVAQUIN) IVPB 500 mg  Status:  Discontinued     500 mg 100 mL/hr over 60 Minutes Intravenous Every 24 hours 09/21/16 0911 09/26/16 [id number]      Subjective:   Jaymir Slaski seen and examined today.  Patient feels his breathing has improved- but not back to baseline.    Objective:   Vitals:   09/27/16 2156 09/28/16 0110 09/28/16 0425 09/28/16 0606  BP: (!) 141/63   (!) 152/59  Pulse: 63   62  Resp: 16   16  Temp: 97.5 F (36.4 C)   97.7 F (36.5 C)  TempSrc: Oral   Oral  SpO2: 98% 97% 96% 99%  Weight:      Height:        Intake/Output Summary (Last 24 hours) at 09/28/16 1211 Last data filed at 09/28/16 [id number]  Gross per 24 hour  Intake              720 ml  Output                0 ml  Net  720 ml   Filed Weights   09/21/16 0558 09/25/16 0451 09/26/16 0500  Weight: 74.4 kg (164 lb) 72.2 kg (159 lb 2.8 oz) 71.8 kg (158 lb 4.6 oz)    Exam  General: Well developed, well nourished, No distress  HEENT: NCAT, mucous membranes moist.   Cardiovascular: S1 S2 auscultated, RRR  Respiratory:  Diffuse wheezing.  Abdomen: Soft, nontender, nondistended, + bowel sounds  Extremities: warm dry without cyanosis clubbing. +LE edema  Neuro: AAOx3, nonfocal  Psych: Normal affect and demeanor  with intact judgement and insight, pleasant  Data Reviewed: I have personally reviewed following labs and imaging studies  CBC:  Recent Labs Lab 09/23/16 0603 09/24/16 0637 09/25/16 0917 09/26/16 0353 09/27/16 0342  WBC 12.8* 11.3* 13.7* 10.5 10.6*  HGB 13.0 12.6* 13.2 12.9* 12.7*  HCT 39.8 37.9* 40.3 38.8* 40.1  MCV 95.0 93.3 94.2 93.5 96.9  PLT 209 181 188 150 [id number]*   Basic Metabolic Panel:  Recent Labs Lab 09/21/16 1537  09/24/16 0637 09/25/16 0917 09/26/16 0353 09/27/16 0342 09/28/16 0528  NA  --   < > 138 139 135 137 133*  K  --   < > 4.2 4.8 4.6 5.2* 4.8  CL  --   < > 101 96* 97* 96* 94*  CO2  --   < > 29 32 31 29 31   GLUCOSE  --   < > 151* 188* 214* 233* 214*  BUN  --   < > 41* 37* 38* 40* 41*  CREATININE  --   < > 1.09 1.18 1.24 1.39* 1.29*  CALCIUM  --   < > 9.0 9.3 8.9 9.0 9.0  MG 2.5*  --   --   --   --   --   --   PHOS 3.6  --   --   --   --   --   --   < > = values in this interval not displayed. GFR: Estimated Creatinine Clearance: 52 mL/min (by C-G formula based on SCr of 1.29 mg/dL (H)). Liver Function Tests: No results for input(s): AST, ALT, ALKPHOS, BILITOT, PROT, ALBUMIN in the last 168 hours. No results for input(s): LIPASE, AMYLASE in the last 168 hours. No results for input(s): AMMONIA in the last 168 hours. Coagulation Profile: No results for input(s): INR, PROTIME in the last 168 hours. Cardiac Enzymes: No results for input(s): CKTOTAL, CKMB, CKMBINDEX, TROPONINI in the last 168 hours. BNP (last 3 results) No results for input(s): PROBNP in the last 8760 hours. HbA1C: No results for input(s): HGBA1C in the last 72 hours. CBG: No results for input(s): GLUCAP in the last 168 hours. Lipid Profile: No results for input(s): CHOL, HDL, LDLCALC, TRIG, CHOLHDL, LDLDIRECT in the last 72 hours. Thyroid Function Tests: No results for input(s): TSH, T4TOTAL, FREET4, T3FREE, THYROIDAB in the last 72 hours. Anemia Panel: No results for input(s):  VITAMINB12, FOLATE, FERRITIN, TIBC, IRON, RETICCTPCT in the last 72 hours. Urine analysis:    Component Value Date/Time   COLORURINE YELLOW 04/17/2013 1650   APPEARANCEUR CLEAR 04/17/2013 1650   LABSPEC 1.018 04/17/2013 1650   PHURINE 5.0 04/17/2013 1650   GLUCOSEU NEGATIVE 04/17/2013 1650   HGBUR NEGATIVE 04/17/2013 1650   BILIRUBINUR NEGATIVE 04/17/2013 1650   KETONESUR NEGATIVE 04/17/2013 1650   PROTEINUR NEGATIVE 04/17/2013 1650   UROBILINOGEN 0.2 04/17/2013 1650   NITRITE NEGATIVE 04/17/2013 1650   LEUKOCYTESUR NEGATIVE 04/17/2013 1650    Recent Results (from the past 240 hour(s))  Respiratory  Panel by PCR     Status: None   Collection Time: 09/21/16  9:06 AM  Result Value Ref Range Status   Adenovirus NOT DETECTED NOT DETECTED Final   Coronavirus 229E NOT DETECTED NOT DETECTED Final   Coronavirus HKU1 NOT DETECTED NOT DETECTED Final   Coronavirus NL63 NOT DETECTED NOT DETECTED Final   Coronavirus OC43 NOT DETECTED NOT DETECTED Final   Metapneumovirus NOT DETECTED NOT DETECTED Final   Rhinovirus / Enterovirus NOT DETECTED NOT DETECTED Final   Influenza A NOT DETECTED NOT DETECTED Final   Influenza B NOT DETECTED NOT DETECTED Final   Parainfluenza Virus 1 NOT DETECTED NOT DETECTED Final   Parainfluenza Virus 2 NOT DETECTED NOT DETECTED Final   Parainfluenza Virus 3 NOT DETECTED NOT DETECTED Final   Parainfluenza Virus 4 NOT DETECTED NOT DETECTED Final   Respiratory Syncytial Virus NOT DETECTED NOT DETECTED Final   Bordetella pertussis NOT DETECTED NOT DETECTED Final   Chlamydophila pneumoniae NOT DETECTED NOT DETECTED Final   Mycoplasma pneumoniae NOT DETECTED NOT DETECTED Final    Radiology Studies: No results found.   Scheduled Meds: . arformoterol  15 mcg Nebulization BID  . aspirin  325 mg Oral Daily  . atorvastatin  80 mg Oral Daily  . bisoprolol  5 mg Oral Daily  . budesonide (PULMICORT) nebulizer solution  0.5 mg Nebulization BID  . enoxaparin (LOVENOX)  injection  40 mg Subcutaneous Q24H  . ferrous sulfate  325 mg Oral Q breakfast  . furosemide  20 mg Oral Daily  . guaiFENesin  600 mg Oral BID  . hydrALAZINE  10 mg Oral TID  . insulin aspart  0-20 Units Subcutaneous TID WC  . insulin aspart  0-5 Units Subcutaneous QHS  . ipratropium-albuterol  3 mL Nebulization Q4H  . isosorbide dinitrate  15 mg Oral Daily  . methylPREDNISolone (SOLU-MEDROL) injection  40 mg Intravenous Q8H  . multivitamin with minerals  1 tablet Oral Daily  . roflumilast  500 mcg Oral Daily  . sodium chloride flush  3 mL Intravenous Q12H  . umeclidinium bromide  1 puff Inhalation Daily   Continuous Infusions:   LOS: 7 days   Time Spent in minutes   24 minutes  Mackensi Mahadeo MD on 09/28/2016 at 12:11 PM  Between 7am to 7pm - Pager - [phone]  After 7pm go to [email or web address] - password [email or web address]  And look for the night coverage person covering for me after hours  [facility] Office  [phone]

## 2016-09-29 LAB — GLUCOSE, CAPILLARY
GLUCOSE-CAPILLARY: 165 mg/dL — AB (ref 65–99)
GLUCOSE-CAPILLARY: 271 mg/dL — AB (ref 65–99)
Glucose-Capillary: 208 mg/dL — ABNORMAL HIGH (ref 65–99)
Glucose-Capillary: 226 mg/dL — ABNORMAL HIGH (ref 65–99)

## 2016-09-29 MED ORDER — GUAIFENESIN ER 600 MG PO TB12
1200.0000 mg | ORAL_TABLET | Freq: Two times a day (BID) | ORAL | Status: DC
Start: 2016-09-29 — End: 2016-10-02
  Administered 2016-09-29 – 2016-10-02 (×6): 1200 mg via ORAL
  Filled 2016-09-29 (×6): qty 2

## 2016-09-29 MED ORDER — INSULIN ASPART 100 UNIT/ML ~~LOC~~ SOLN
3.0000 [IU] | Freq: Three times a day (TID) | SUBCUTANEOUS | Status: DC
  Administered 2016-09-29 – 2016-10-02 (×9): 3 [IU] via SUBCUTANEOUS

## 2016-09-29 MED ORDER — INSULIN GLARGINE 100 UNIT/ML ~~LOC~~ SOLN
5.0000 [IU] | Freq: Every day | SUBCUTANEOUS | Status: DC
  Administered 2016-09-29: 5 [IU] via SUBCUTANEOUS
  Filled 2016-09-29 (×2): qty 0.05

## 2016-09-29 NOTE — Care Management Important Message (Signed)
Important Message  Patient Details  Name: [name] MRN: [id number] Date of Birth: 1951/03/14   Medicare Important Message Given:  Yes    [name] 09/29/2016, 3:14 PM

## 2016-09-29 NOTE — Progress Notes (Signed)
PCCM Progress Note  Admission date: 09/21/2016 Consult date: 09/26/2016 Referring provider: Dr. Ree Kida, [facility]  CC: short of breath  HPI: 65 yo male former smoker with productive cough, and dyspnea from COPD exacerbation.  Failed to improve with outpt therapy with progressive hypoxia.  PMhx of ischemic CM, HTN, COPD on home oxygen, chronic pain, thoracoabdominal aneurysm.  Studies: PFT 04/12/16 >> FEV1 1.00 (33%), FEV1% 45, TLC 6.39 (99%), DLCO 34% CT chest 08/30/16 >> centrilobular/paraseptal emphysema Echo 09/14/16 >> EF 55 to 60%, grade 1 DD, aortic root 41 mm  Cultures: Respiratory viral panel 12/21 >> negative  Subjective: Still has wheeze, but feels he is slowly improving.  Vital signs: BP (!) 161/61 (BP Location: Right Arm) Comment: RN notified   Pulse 70   Temp 97.7 F (36.5 C) (Oral)   Resp 16   Ht 5\' 7"  (1.702 m)   Wt 149 lb 11.1 oz (67.9 kg)   SpO2 97%   BMI 23.45 kg/m   Intake/output: I/O last 3 completed shifts: In: 960 [P.O.:960] Out: -   General: pleasant Neuro: normal strength HEENT: no stridor Cardiac: regular, no murmur Chest: faint b/l wheeze Abd: soft, non tender Ext: no edema Skin: multiple areas of ecchymoses   CMP Latest Ref Rng & Units 09/28/2016 09/27/2016 09/26/2016  Glucose 65 - 99 mg/dL 214(H) 233(H) 214(H)  BUN 6 - 20 mg/dL 41(H) 40(H) 38(H)  Creatinine 0.61 - 1.24 mg/dL 1.29(H) 1.39(H) 1.24  Sodium 135 - 145 mmol/L 133(L) 137 135  Potassium 3.5 - 5.1 mmol/L 4.8 5.2(H) 4.6  Chloride 101 - 111 mmol/L 94(L) 96(L) 97(L)  CO2 22 - 32 mmol/L 31 29 31   Calcium 8.9 - 10.3 mg/dL 9.0 9.0 8.9  Total Protein 6.5 - 8.1 g/dL - - -  Total Bilirubin 0.3 - 1.2 mg/dL - - -  Alkaline Phos 38 - 126 U/L - - -  AST 15 - 41 U/L - - -  ALT 17 - 63 U/L - - -    CBC Latest Ref Rng & Units 09/27/2016 09/26/2016 09/25/2016  WBC 4.0 - 10.5 K/uL 10.6(H) 10.5 13.7(H)  Hemoglobin 13.0 - 17.0 g/dL 12.7(L) 12.9(L) 13.2  Hematocrit 39.0 - 52.0 % 40.1  38.8(L) 40.3  Platelets 150 - 400 K/uL 140(L) 150 188    CBG (last 3)   Recent Labs  09/28/16 1646 09/28/16 2133 09/29/16 0623  GLUCAP 221* 213* 208*     ABG    Component Value Date/Time   PHART 7.276 (L) 04/17/2013 0204   PCO2ART 33.9 (L) 04/17/2013 0204   PO2ART 68.1 (L) 04/17/2013 0204   HCO3 30.5 (H) 09/21/2016 0734   TCO2 32 09/21/2016 0734   ACIDBASEDEF 10.2 (H) 04/17/2013 0204   O2SAT 73.0 09/21/2016 0734    Imaging: No results found.  Assessment/plan:  Acute on chronic respiratory failure from COPD exacerbation. - continue pulmicort, brovana >> continue these as outpt - continue incruse - continue scheduled duoneb for now - continue daliresp - continue solumedrol >> hopefully can transition to prednisone over next few days - adjust oxygen to keep SpO2 88 to 95% - bronchial hygiene  Acute on chronic diastolic CHF. - per primary team  Steroid induced hyperglycemia. - SSI while on solumedrol  DVT prophylaxis - Lovenox SUP - not indicated Nutrition - Heart healthy diet Goals of care - full code  I have scheduled him for pulmonary office follow with Tammy Parrett on Friday, 10/13/16 at 930 am.  Please call if additional help is needed while  he is in hospital.  Chesley Mires, MD [place] 09/29/2016, 9:11 AM Pager:  [phone] After 3pm call: [phone]

## 2016-09-29 NOTE — Progress Notes (Signed)
Physical Therapy Treatment Patient Details Name: [name] MRN: [id number] DOB: 1950-10-03 Today's Date: 09/29/2016    History of Present Illness Pt is a 65 y/o male with PMH significant for history of prior ischemic cardiomyopathy was recovered L ventricular systolic function, COPD on prn nocturnal oxygen as well as L upper lobe opacity and lung nodules, normocytic anemia, chronic diastolic dysfunction, chronic pain on narcotics, known thoracoabdominal abneurysm and history of tobacco abuse. Admitted for COPD exacerbation and bronchitis. Slow to improve.     PT Comments    Pt was able to walk a bit further today, without O2 and maintained sats at 90% despite DOE of 2-3/4 during gait.  He is not yet back to baseline, but improving.  PT will continue to follow acutely to work on activity tolerance and endurance.    Follow Up Recommendations  No PT follow up     Equipment Recommendations  None recommended by PT    Recommendations for Other Services   NA     Precautions / Restrictions Precautions Precautions: Fall;Other (comment) Precaution Comments: he is mildly unsteady on his feet, monitor O2 sats during session.     Mobility  Bed Mobility               General bed mobility comments: Pt seated EOB  Transfers Overall transfer level: Needs assistance Equipment used: None Transfers: Sit to/from Stand Sit to Stand: Supervision         General transfer comment: supervision for safety  Ambulation/Gait Ambulation/Gait assistance: Min guard Ambulation Distance (Feet): 200 Feet   Gait Pattern/deviations: Step-through pattern;Staggering left;Staggering right Gait velocity: decreased Gait velocity interpretation: Below normal speed for age/gender General Gait Details: min guard assist for one small LOB.  Pt prefers not to use an assistive device and reports the staggering is from his right leg and back pain.  DOE 2-3/4 during gait with O2 sats down to 90% on RA.  Pt  increased quickly to mid 90s on 2 L O2 [place] in his room.            Balance Overall balance assessment: Needs assistance Sitting-balance support: Feet supported;No upper extremity supported Sitting balance-Leahy Scale: Good     Standing balance support: No upper extremity supported Standing balance-Leahy Scale: Fair                      Cognition Arousal/Alertness: Awake/alert Behavior During Therapy: WFL for tasks assessed/performed Overall Cognitive Status: Within Functional Limits for tasks assessed                             Pertinent Vitals/Pain Pain Assessment: Faces Faces Pain Scale: Hurts little more Pain Location: low back and right leg, chronic pain Pain Descriptors / Indicators: Aching Pain Intervention(s): Monitored during session;Limited activity within patient's tolerance;Repositioned           PT Goals (current goals can now be found in the care plan section) Acute Rehab PT Goals Patient Stated Goal: to go home (hopefully tomorrow) Progress towards PT goals: Progressing toward goals    Frequency    Min 3X/week      PT Plan Current plan remains appropriate       End of Session   Activity Tolerance: Patient limited by fatigue;Other (comment) (limited by DOE) Patient left: in bed;with call bell/phone within reach (seated EOB)     Time: [id number] PT Time Calculation (min) (ACUTE ONLY): 8 min  Charges:  $Gait Training: 8-22 mins                      [name] B. [name], PT, DPT [phone]   09/29/2016, 5:26 PM

## 2016-09-29 NOTE — Progress Notes (Signed)
PROGRESS NOTE    [name]  [id number] DOB: December 30, 1950 DOA: 09/21/2016 PCP: [name], MD   Chief Complaint  Patient presents with  . Shortness of Breath   Brief Narrative:  65 y.o.malewith medical history significant for history of prior ischemic cardiomyopathy was recovered left ventricular systolic function based on most recent echocardiogram last month, hypertension, COPD on prn nocturnal oxygen as well as left upper lobe opacity and lung nodules followed by Dr. Ashok Cordia as an outpatient, normocytic anemia, chronic diastolic dysfunction, chronic pain on narcotics, known thoracoabdominal aneurysm and history of tobacco abuse  Admitted for COPD exac and bronchitis. Pulmonology consulted.  Slow to improve.  Assessment & Plan   Acute exacerbation of COPD with acute on chronic bronchitis -Presented with worsening respiratory symptoms/increased work of breathing and excessive wheezing that has not improved with increased use of nebulizers, 48 hours of oral prednisone and Levaquin -Also noted to be hypoxemic (see below) -Continue IV Solu-Medrol, levaquin, nebs, Daliresp, incentive spirometry, supplemental oxygen -Very slow to improve -Respiratory viral panel negative -Follows up with Dr. Ashok Cordia, pulmonology- Dr. Ashok Cordia PCCM consulted and appreciated.  See notes.  -Repeat CXR COPD/bronchitis -PT consulted : No PT follow up recommended. -Pulmonary team added roflumilast   Acute on chronic respiratory failure with hypoxia  -Typically utilizes 2L prn at night -Continue supplemental O2 throughout the day.   -O2 sats to be monitored with rest and ambulation  -Continue current mgmt as he is slowly improving  Leukocytosis -likely secondary to steroids -Continue to monitor CBC  Essential hypertension -Currently controlled -Continue Zybeta and hydralazine  Coronary artery disease -Continue aspirin, statin, beta blocker, nitrate  Diastolic dysfunction with chronic  heart failure  -Continue lasix -Currently appears to be euvolemic  -monitor intake/output, daily weights  Normocytic anemia -Hemoglobin stable, 12.7 -Continue iron -monitor CBC  Steroid induced hyperglycemia - added low dose glargine and prandial novolog, plus sliding scale coverage ordered. CBG (last 3)   Recent Labs  09/28/16 2133 09/29/16 0623 09/29/16 1110  GLUCAP 213* 208* 165*   Chronic pain disorder/anxiety disorder -Continue home morphine and Xanax  Tobacco abuse -States last use was 3 weeks ago  DVT Prophylaxis  Lovenox  Code Status: Full  Family Communication: at bedside  Disposition Plan: admitted. Home in 1-2 days, possibly 12/30  Consultants PCCM   Antibiotics   Anti-infectives    Start     Dose/Rate Route Frequency Ordered Stop   09/27/16 1000  levofloxacin (LEVAQUIN) tablet 500 mg     500 mg Oral Daily 09/26/16 0904 09/27/16 0804   09/21/16 1000  levofloxacin (LEVAQUIN) IVPB 500 mg  Status:  Discontinued     500 mg 100 mL/hr over 60 Minutes Intravenous Every 24 hours 09/21/16 0911 09/26/16 [id number]      Subjective:   [name] seen and examined today.  Patient feels his breathing has improved- but not back to baseline yet, doesn't feel he is safe for home yet.    Objective:   Vitals:   09/29/16 0332 09/29/16 0430 09/29/16 0834 09/29/16 1212  BP:  (!) 161/61    Pulse:  70    Resp:  16    Temp:  97.7 F (36.5 C)    TempSrc:  Oral    SpO2: 100% 100% 97% 94%  Weight:  67.9 kg (149 lb 11.1 oz)    Height:        Intake/Output Summary (Last 24 hours) at 09/29/16 1217 Last data filed at 09/29/16 0900  Gross per  24 hour  Intake              720 ml  Output                0 ml  Net              720 ml   Filed Weights   09/25/16 0451 09/26/16 0500 09/29/16 0430  Weight: 72.2 kg (159 lb 2.8 oz) 71.8 kg (158 lb 4.6 oz) 67.9 kg (149 lb 11.1 oz)    Exam  General: Well developed, well nourished, No distress  HEENT: NCAT, mucous  membranes moist.   Cardiovascular: S1 S2 auscultated, RRR  Respiratory:  Diffuse wheezing but better air movement noted.  Abdomen: Soft, nontender, nondistended, + bowel sounds  Extremities: warm dry without cyanosis clubbing. +LE edema  Neuro: AAOx3, nonfocal  Psych: Normal affect and demeanor with intact judgement and insight, pleasant  Data Reviewed: I have personally reviewed following labs and imaging studies  CBC:  Recent Labs Lab 09/23/16 0603 09/24/16 0637 09/25/16 0917 09/26/16 0353 09/27/16 0342  WBC 12.8* 11.3* 13.7* 10.5 10.6*  HGB 13.0 12.6* 13.2 12.9* 12.7*  HCT 39.8 37.9* 40.3 38.8* 40.1  MCV 95.0 93.3 94.2 93.5 96.9  PLT 209 181 188 150 [id number]*   Basic Metabolic Panel:  Recent Labs Lab 09/24/16 0637 09/25/16 0917 09/26/16 0353 09/27/16 0342 09/28/16 0528  NA 138 139 135 137 133*  K 4.2 4.8 4.6 5.2* 4.8  CL 101 96* 97* 96* 94*  CO2 29 32 31 29 31   GLUCOSE 151* 188* 214* 233* 214*  BUN 41* 37* 38* 40* 41*  CREATININE 1.09 1.18 1.24 1.39* 1.29*  CALCIUM 9.0 9.3 8.9 9.0 9.0   GFR: Estimated Creatinine Clearance: 52 mL/min (by C-G formula based on SCr of 1.29 mg/dL (H)). Liver Function Tests: No results for input(s): AST, ALT, ALKPHOS, BILITOT, PROT, ALBUMIN in the last 168 hours. No results for input(s): LIPASE, AMYLASE in the last 168 hours. No results for input(s): AMMONIA in the last 168 hours. Coagulation Profile: No results for input(s): INR, PROTIME in the last 168 hours. Cardiac Enzymes: No results for input(s): CKTOTAL, CKMB, CKMBINDEX, TROPONINI in the last 168 hours. BNP (last 3 results) No results for input(s): PROBNP in the last 8760 hours. HbA1C: No results for input(s): HGBA1C in the last 72 hours. CBG:  Recent Labs Lab 09/28/16 1223 09/28/16 1646 09/28/16 2133 09/29/16 0623 09/29/16 1110  GLUCAP 212* 221* 213* 208* 165*   Lipid Profile: No results for input(s): CHOL, HDL, LDLCALC, TRIG, CHOLHDL, LDLDIRECT in the last  72 hours. Thyroid Function Tests: No results for input(s): TSH, T4TOTAL, FREET4, T3FREE, THYROIDAB in the last 72 hours. Anemia Panel: No results for input(s): VITAMINB12, FOLATE, FERRITIN, TIBC, IRON, RETICCTPCT in the last 72 hours. Urine analysis:    Component Value Date/Time   COLORURINE YELLOW 04/17/2013 1650   APPEARANCEUR CLEAR 04/17/2013 1650   LABSPEC 1.018 04/17/2013 1650   PHURINE 5.0 04/17/2013 1650   GLUCOSEU NEGATIVE 04/17/2013 1650   HGBUR NEGATIVE 04/17/2013 1650   BILIRUBINUR NEGATIVE 04/17/2013 1650   KETONESUR NEGATIVE 04/17/2013 1650   PROTEINUR NEGATIVE 04/17/2013 1650   UROBILINOGEN 0.2 04/17/2013 1650   NITRITE NEGATIVE 04/17/2013 1650   LEUKOCYTESUR NEGATIVE 04/17/2013 1650    Recent Results (from the past 240 hour(s))  Respiratory Panel by PCR     Status: None   Collection Time: 09/21/16  9:06 AM  Result Value Ref Range Status   Adenovirus NOT DETECTED  NOT DETECTED Final   Coronavirus 229E NOT DETECTED NOT DETECTED Final   Coronavirus HKU1 NOT DETECTED NOT DETECTED Final   Coronavirus NL63 NOT DETECTED NOT DETECTED Final   Coronavirus OC43 NOT DETECTED NOT DETECTED Final   Metapneumovirus NOT DETECTED NOT DETECTED Final   Rhinovirus / Enterovirus NOT DETECTED NOT DETECTED Final   Influenza A NOT DETECTED NOT DETECTED Final   Influenza B NOT DETECTED NOT DETECTED Final   Parainfluenza Virus 1 NOT DETECTED NOT DETECTED Final   Parainfluenza Virus 2 NOT DETECTED NOT DETECTED Final   Parainfluenza Virus 3 NOT DETECTED NOT DETECTED Final   Parainfluenza Virus 4 NOT DETECTED NOT DETECTED Final   Respiratory Syncytial Virus NOT DETECTED NOT DETECTED Final   Bordetella pertussis NOT DETECTED NOT DETECTED Final   Chlamydophila pneumoniae NOT DETECTED NOT DETECTED Final   Mycoplasma pneumoniae NOT DETECTED NOT DETECTED Final    Radiology Studies: No results found.   Scheduled Meds: . arformoterol  15 mcg Nebulization BID  . aspirin  325 mg Oral Daily    . atorvastatin  80 mg Oral Daily  . bisoprolol  5 mg Oral Daily  . budesonide (PULMICORT) nebulizer solution  0.5 mg Nebulization BID  . enoxaparin (LOVENOX) injection  40 mg Subcutaneous Q24H  . ferrous sulfate  325 mg Oral Q breakfast  . furosemide  20 mg Oral Daily  . guaiFENesin  1,200 mg Oral BID  . hydrALAZINE  10 mg Oral TID  . insulin aspart  0-20 Units Subcutaneous TID WC  . insulin aspart  0-5 Units Subcutaneous QHS  . insulin aspart  3 Units Subcutaneous TID WC  . insulin glargine  5 Units Subcutaneous QHS  . ipratropium-albuterol  3 mL Nebulization Q4H  . isosorbide dinitrate  15 mg Oral Daily  . methylPREDNISolone (SOLU-MEDROL) injection  40 mg Intravenous Q8H  . multivitamin with minerals  1 tablet Oral Daily  . roflumilast  500 mcg Oral Daily  . sodium chloride flush  3 mL Intravenous Q12H  . umeclidinium bromide  1 puff Inhalation Daily   Continuous Infusions:   LOS: 8 days   Time Spent in minutes   22 minutes  Clanford Johnson MD on 09/29/2016 at 12:17 PM  Between 7am to 7pm - Pager - [phone]  After 7pm go to [email or web address] - password [email or web address]  And look for the night coverage person covering for me after hours  [facility] Office  [phone]

## 2016-09-29 NOTE — Progress Notes (Signed)
Inpatient Diabetes Program Recommendations  AACE/ADA: New Consensus Statement on Inpatient Glycemic Control (2015)  Target Ranges:  Prepandial:   less than 140 mg/dL      Peak postprandial:   less than 180 mg/dL (1-2 hours)      Critically ill patients:  140 - 180 mg/dL   Lab Results  Component Value Date   GLUCAP 208 (H) 09/29/2016   HGBA1C 5.9 03/23/2016    Review of Glycemic Control  No hx DM noted on EMR. Last HgbA1C on 03/23/2016 was 5.9%. On steroids and eating 100%. Needs meal coverage insulin. FBS slightly elevated. Low dose basal.  Inpatient Diabetes Program Recommendations:    Add Novolog 3 units tidwc for meal coverage insulin if pt eats > 50% meal. Add Lantus 5 units QHS  Will follow. Thank you. Lorenda Peck, RD, LDN, CDE Inpatient Diabetes Coordinator [phone]

## 2016-09-30 ENCOUNTER — Encounter (HOSPITAL_COMMUNITY): Payer: Self-pay | Admitting: Physician Assistant

## 2016-09-30 DIAGNOSIS — I5032 Chronic diastolic (congestive) heart failure: Secondary | ICD-10-CM

## 2016-09-30 DIAGNOSIS — I10 Essential (primary) hypertension: Secondary | ICD-10-CM

## 2016-09-30 DIAGNOSIS — I714 Abdominal aortic aneurysm, without rupture, unspecified: Secondary | ICD-10-CM

## 2016-09-30 DIAGNOSIS — I480 Paroxysmal atrial fibrillation: Secondary | ICD-10-CM

## 2016-09-30 DIAGNOSIS — I472 Ventricular tachycardia: Secondary | ICD-10-CM

## 2016-09-30 DIAGNOSIS — I25118 Atherosclerotic heart disease of native coronary artery with other forms of angina pectoris: Secondary | ICD-10-CM

## 2016-09-30 DIAGNOSIS — I4729 Other ventricular tachycardia: Secondary | ICD-10-CM

## 2016-09-30 LAB — TROPONIN I
TROPONIN I: 0.03 ng/mL — AB (ref <0.03)
Troponin I: 0.03 ng/mL (ref <0.03)
Troponin I: 0.03 ng/mL (ref <0.03)

## 2016-09-30 LAB — MAGNESIUM: MAGNESIUM: 2.2 mg/dL (ref 1.7–2.4)

## 2016-09-30 LAB — GLUCOSE, CAPILLARY
GLUCOSE-CAPILLARY: 212 mg/dL — AB (ref 65–99)
GLUCOSE-CAPILLARY: 142 mg/dL — AB (ref 65–99)
Glucose-Capillary: 236 mg/dL — ABNORMAL HIGH (ref 65–99)
Glucose-Capillary: 200 mg/dL — ABNORMAL HIGH (ref 65–99)

## 2016-09-30 LAB — BASIC METABOLIC PANEL
Anion gap: 10 (ref 5–15)
BUN: 42 mg/dL — ABNORMAL HIGH (ref 6–20)
CALCIUM: 8.8 mg/dL — AB (ref 8.9–10.3)
CHLORIDE: 94 mmol/L — AB (ref 101–111)
CO2: 33 mmol/L — ABNORMAL HIGH (ref 22–32)
CREATININE: 1.06 mg/dL (ref 0.61–1.24)
GFR calc non Af Amer: >60 mL/min (ref >60)
Glucose, Bld: 233 mg/dL — ABNORMAL HIGH (ref 65–99)
Potassium: 4.6 mmol/L (ref 3.5–5.1)
SODIUM: 137 mmol/L (ref 135–145)

## 2016-09-30 LAB — CBC
HEMATOCRIT: 36.9 % — AB (ref 39.0–52.0)
HEMOGLOBIN: 12.4 g/dL — AB (ref 13.0–17.0)
MCH: 30.9 pg (ref 26.0–34.0)
MCHC: 33.6 g/dL (ref 30.0–36.0)
MCV: 92 fL (ref 78.0–100.0)
Platelets: 145 10*3/uL — ABNORMAL LOW (ref 150–400)
RBC: 4.01 MIL/uL — ABNORMAL LOW (ref 4.22–5.81)
RDW: 14.5 % (ref 11.5–15.5)
WBC: 17.8 10*3/uL — AB (ref 4.0–10.5)

## 2016-09-30 MED ORDER — INSULIN GLARGINE 100 UNIT/ML ~~LOC~~ SOLN
10.0000 [IU] | Freq: Every day | SUBCUTANEOUS | Status: DC
  Administered 2016-09-30 – 2016-10-01 (×2): 10 [IU] via SUBCUTANEOUS
  Filled 2016-09-30 (×2): qty 0.1

## 2016-09-30 MED ORDER — METHYLPREDNISOLONE SODIUM SUCC 125 MG IJ SOLR
40.0000 mg | Freq: Two times a day (BID) | INTRAMUSCULAR | Status: DC
  Administered 2016-09-30 – 2016-10-01 (×2): 40 mg via INTRAVENOUS
  Filled 2016-09-30 (×2): qty 2

## 2016-09-30 NOTE — Progress Notes (Signed)
Notified by CCMD that pt had 11 beats Vtach. EKG performed. MD notified and new orders received. Pt stated that he felt fine following the event and states that at one time he felt "jittery." Will continue to assess and monitor.

## 2016-09-30 NOTE — Progress Notes (Signed)
Patient ID: [name], male   DOB: 07-15-1951, 65 y.o.   MRN: [id number] Notified by nursing of 11 beat run of Vtach. Pateint complained of feeling jittery.    EKG with new inferolateral T wave inversions.   Stat trop now and then q6.  Check mag level as well.   Patient already on aspiring, beta blocker, nitrate and statin.  Repeat EKG in AM and consider cardio consult.    Monitor closely.   Thank you.

## 2016-09-30 NOTE — Progress Notes (Signed)
PROGRESS NOTE    [name]   [id number]  DOB: 06-Jun-1951  DOA: 09/21/2016 PCP: [name], MD   Brief Narrative:  65 y.o.malewith medical history significant for history of prior ischemic cardiomyopathy was recovered left ventricular systolic function based on most recent echocardiogram last month, hypertension, COPD on prn nocturnal oxygen as well as left upper lobe opacity and lung nodules followed by Dr. Ashok Cordia as an outpatient, normocytic anemia, chronic diastolic dysfunction, chronic pain on narcotics, known thoracoabdominal aneurysm and history of tobacco abuse  Admitted for COPD exac and bronchitis. Pulmonology consulted.  Slow to improve.  Subjective:  Patient seen and examined feels that her breathing is improving. Denies chest pain, palpitation and dizziness. No new concerns. Overnight had an episode of nonsustained SVT.  A&P:  Acute exacerbation of COPD with acute on chronic bronchitis - improving  Completed a course of Levaquin,  Taper Solumedrol - hopefully can switch to prednisone tomorrow Continue nebs, Daliresp, incentive spirometry, supplemental oxygen Wean O2 as tolerated  Respiratory viral panel negative Follows up with Dr. Ashok Cordia, pulmonology- Dr. Ashok Cordia PCCM consulted and appreciated.  See notes.  Pulmonary team added roflumilast   Coronary artery disease - had an episode nonsustained SVT overnight Cardiology was consulted due to slight increase in troponin and EKG with T wave inversion. Patient denies chest pain Given comorbidities and history of large AAA cardiology not recommending further evaluation and respect of ischemic heart disease, continue conservative therapy patient on aspirin, statins, BB and nitrates Recent and potassium within normal limits  Acute on chronic respiratory failure with hypoxia - Resolved O2 at baseline  On O2 at home 2L at night  Ambulatory sat  See above   Leukocytosis likely secondary to steroids Continue to  monitor CBC  Essential hypertension Currently controlled Continue Zybeta and hydralazine  Diastolic dysfunction with chronic heart failure  Continue lasix Currently appears to be euvolemic  monitor intake/output, daily weights  Steroid induced hyperglycemia - added low dose glargine and prandial novolog, plus sliding scale coverage ordered. will increase lantus for coverage    Chronic pain disorder/anxiety disorder Continue home morphine and Xanax  Tobacco abuse Tobacco cessation discussed   DVT Prophylaxis  Lovenox  Code Status: Full  Family Communication: at bedside  Disposition Plan: Possible d/c in AM   Consultants PCCM Cardiology    Antibiotics   Anti-infectives    Start     Dose/Rate Route Frequency Ordered Stop   09/27/16 1000  levofloxacin (LEVAQUIN) tablet 500 mg     500 mg Oral Daily 09/26/16 0904 09/27/16 0804   09/21/16 1000  levofloxacin (LEVAQUIN) IVPB 500 mg  Status:  Discontinued     500 mg 100 mL/hr over 60 Minutes Intravenous Every 24 hours 09/21/16 0911 09/26/16 0904      Objective:   Vitals:   09/30/16 0836 09/30/16 1220 09/30/16 1434 09/30/16 1545  BP:   118/76   Pulse:   75   Resp:   18   Temp:   97.8 F (36.6 C)   TempSrc:   Oral   SpO2: 96% 96% 100% 98%  Weight:      Height:        Intake/Output Summary (Last 24 hours) at 09/30/16 1844 Last data filed at 09/30/16 1738  Gross per 24 hour  Intake              723 ml  Output                0  ml  Net              723 ml   Filed Weights   09/26/16 0500 09/29/16 0430 09/30/16 0500  Weight: 71.8 kg (158 lb 4.6 oz) 67.9 kg (149 lb 11.1 oz) 71.3 kg (157 lb 3 oz)    Exam GEN: NAD  Lung: Good air entry, ins/exp wheezing, mild rales throughout  Heart: S1S2, RRR no murmurs no JVD  Abd: Soft NDNT murmur ausculted periumbilically  Extr: No edema   Data Reviewed: I have personally reviewed following labs and imaging studies  CBC:  Recent Labs Lab 09/24/16 0637  09/25/16 0917 09/26/16 0353 09/27/16 0342 09/30/16 1136  WBC 11.3* 13.7* 10.5 10.6* 17.8*  HGB 12.6* 13.2 12.9* 12.7* 12.4*  HCT 37.9* 40.3 38.8* 40.1 36.9*  MCV 93.3 94.2 93.5 96.9 92.0  PLT 181 188 150 140* [id number]*   Basic Metabolic Panel:  Recent Labs Lab 09/25/16 0917 09/26/16 0353 09/27/16 0342 09/28/16 0528 09/30/16 0328 09/30/16 1136  NA 139 135 137 133*  --  137  K 4.8 4.6 5.2* 4.8  --  4.6  CL 96* 97* 96* 94*  --  94*  CO2 32 31 29 31   --  33*  GLUCOSE 188* 214* 233* 214*  --  233*  BUN 37* 38* 40* 41*  --  42*  CREATININE 1.18 1.24 1.39* 1.29*  --  1.06  CALCIUM 9.3 8.9 9.0 9.0  --  8.8*  MG  --   --   --   --  2.2  --    GFR: Estimated Creatinine Clearance: 63.2 mL/min (by C-G formula based on SCr of 1.06 mg/dL). Liver Function Tests: No results for input(s): AST, ALT, ALKPHOS, BILITOT, PROT, ALBUMIN in the last 168 hours. No results for input(s): LIPASE, AMYLASE in the last 168 hours. No results for input(s): AMMONIA in the last 168 hours. Coagulation Profile: No results for input(s): INR, PROTIME in the last 168 hours. Cardiac Enzymes:  Recent Labs Lab 09/30/16 0328 09/30/16 1136  TROPONINI 0.03* 0.03*   BNP (last 3 results) No results for input(s): PROBNP in the last 8760 hours. HbA1C: No results for input(s): HGBA1C in the last 72 hours. CBG:  Recent Labs Lab 09/29/16 1621 09/29/16 2027 09/30/16 0627 09/30/16 1129 09/30/16 1541  GLUCAP 271* 226* 212* 236* 200*   Lipid Profile: No results for input(s): CHOL, HDL, LDLCALC, TRIG, CHOLHDL, LDLDIRECT in the last 72 hours. Thyroid Function Tests: No results for input(s): TSH, T4TOTAL, FREET4, T3FREE, THYROIDAB in the last 72 hours. Anemia Panel: No results for input(s): VITAMINB12, FOLATE, FERRITIN, TIBC, IRON, RETICCTPCT in the last 72 hours. Urine analysis:    Component Value Date/Time   COLORURINE YELLOW 04/17/2013 1650   APPEARANCEUR CLEAR 04/17/2013 1650   LABSPEC 1.018 04/17/2013  1650   PHURINE 5.0 04/17/2013 1650   GLUCOSEU NEGATIVE 04/17/2013 1650   HGBUR NEGATIVE 04/17/2013 1650   BILIRUBINUR NEGATIVE 04/17/2013 1650   KETONESUR NEGATIVE 04/17/2013 1650   PROTEINUR NEGATIVE 04/17/2013 1650   UROBILINOGEN 0.2 04/17/2013 1650   NITRITE NEGATIVE 04/17/2013 1650   LEUKOCYTESUR NEGATIVE 04/17/2013 1650    Recent Results (from the past 240 hour(s))  Respiratory Panel by PCR     Status: None   Collection Time: 09/21/16  9:06 AM  Result Value Ref Range Status   Adenovirus NOT DETECTED NOT DETECTED Final   Coronavirus 229E NOT DETECTED NOT DETECTED Final   Coronavirus HKU1 NOT DETECTED NOT DETECTED Final   Coronavirus NL63  NOT DETECTED NOT DETECTED Final   Coronavirus OC43 NOT DETECTED NOT DETECTED Final   Metapneumovirus NOT DETECTED NOT DETECTED Final   Rhinovirus / Enterovirus NOT DETECTED NOT DETECTED Final   Influenza A NOT DETECTED NOT DETECTED Final   Influenza B NOT DETECTED NOT DETECTED Final   Parainfluenza Virus 1 NOT DETECTED NOT DETECTED Final   Parainfluenza Virus 2 NOT DETECTED NOT DETECTED Final   Parainfluenza Virus 3 NOT DETECTED NOT DETECTED Final   Parainfluenza Virus 4 NOT DETECTED NOT DETECTED Final   Respiratory Syncytial Virus NOT DETECTED NOT DETECTED Final   Bordetella pertussis NOT DETECTED NOT DETECTED Final   Chlamydophila pneumoniae NOT DETECTED NOT DETECTED Final   Mycoplasma pneumoniae NOT DETECTED NOT DETECTED Final    Radiology Studies: No results found.   Scheduled Meds: . arformoterol  15 mcg Nebulization BID  . aspirin  325 mg Oral Daily  . atorvastatin  80 mg Oral Daily  . bisoprolol  5 mg Oral Daily  . budesonide (PULMICORT) nebulizer solution  0.5 mg Nebulization BID  . enoxaparin (LOVENOX) injection  40 mg Subcutaneous Q24H  . ferrous sulfate  325 mg Oral Q breakfast  . furosemide  20 mg Oral Daily  . guaiFENesin  1,200 mg Oral BID  . hydrALAZINE  10 mg Oral TID  . insulin aspart  0-20 Units Subcutaneous TID  WC  . insulin aspart  0-5 Units Subcutaneous QHS  . insulin aspart  3 Units Subcutaneous TID WC  . insulin glargine  5 Units Subcutaneous QHS  . ipratropium-albuterol  3 mL Nebulization Q4H  . isosorbide dinitrate  15 mg Oral Daily  . methylPREDNISolone (SOLU-MEDROL) injection  40 mg Intravenous Q12H  . multivitamin with minerals  1 tablet Oral Daily  . roflumilast  500 mcg Oral Daily  . sodium chloride flush  3 mL Intravenous Q12H  . umeclidinium bromide  1 puff Inhalation Daily   Continuous Infusions:   LOS: 9 days    Chipper Oman MD on 09/30/2016 at 6:44 PM Pager: [email or web address] - password [email or web address]  After 7pm go to [email or web address] - password [email or web address]  And look for the night coverage person covering for me after hours  [facility] Office  [phone]
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
## 2016-10-01 DIAGNOSIS — R739 Hyperglycemia, unspecified: Secondary | ICD-10-CM

## 2016-10-01 DIAGNOSIS — T380X5A Adverse effect of glucocorticoids and synthetic analogues, initial encounter: Secondary | ICD-10-CM

## 2016-10-01 LAB — BASIC METABOLIC PANEL
ANION GAP: 10 (ref 5–15)
BUN: 44 mg/dL — ABNORMAL HIGH (ref 6–20)
CALCIUM: 8.9 mg/dL (ref 8.9–10.3)
CO2: 29 mmol/L (ref 22–32)
Chloride: 96 mmol/L — ABNORMAL LOW (ref 101–111)
Creatinine, Ser: 1.07 mg/dL (ref 0.61–1.24)
GFR calc non Af Amer: >60 mL/min (ref >60)
GLUCOSE: 173 mg/dL — AB (ref 65–99)
POTASSIUM: 4.7 mmol/L (ref 3.5–5.1)
Sodium: 135 mmol/L (ref 135–145)

## 2016-10-01 LAB — GLUCOSE, CAPILLARY
GLUCOSE-CAPILLARY: 187 mg/dL — AB (ref 65–99)
GLUCOSE-CAPILLARY: 150 mg/dL — AB (ref 65–99)
Glucose-Capillary: 167 mg/dL — ABNORMAL HIGH (ref 65–99)
Glucose-Capillary: 203 mg/dL — ABNORMAL HIGH (ref 65–99)

## 2016-10-01 LAB — MAGNESIUM: MAGNESIUM: 2.2 mg/dL (ref 1.7–2.4)

## 2016-10-01 MED ORDER — PREDNISONE 20 MG PO TABS
60.0000 mg | ORAL_TABLET | Freq: Every day | ORAL | Status: DC
  Administered 2016-10-01 – 2016-10-02 (×2): 60 mg via ORAL
  Filled 2016-10-01: qty 6
  Filled 2016-10-01: qty 3

## 2016-10-01 MED ORDER — PREDNISONE 20 MG PO TABS: 60.0000 mg | ORAL_TABLET | Freq: Every day | ORAL | Status: DC

## 2016-10-01 NOTE — Progress Notes (Signed)
PROGRESS NOTE    [name]   [id number]  DOB: Aug 06, 1951  DOA: 09/21/2016 PCP: [name], MD   Brief Narrative:  65 y.o.malewith medical history significant for history of prior ischemic cardiomyopathy was recovered left ventricular systolic function based on most recent echocardiogram last month, hypertension, COPD on prn nocturnal oxygen as well as left upper lobe opacity and lung nodules followed by Dr. Ashok Cordia as an outpatient, normocytic anemia, chronic diastolic dysfunction, chronic pain on narcotics, known thoracoabdominal aneurysm and history of tobacco abuse  Admitted for COPD exac and bronchitis. Pulmonology consulted.  Slow to improve.  Subjective:  Breathing continues to improves. Now on O2 at baseline, although patient feels that air still trapped. No acute events overnight. Afebrile   A&P:  Acute exacerbation of COPD with acute on chronic bronchitis - Slowly improving  Completed a course of Levaquin,  D/c solumedrol - start prednisone 60 mg - I suspect that this patient will likely remain in steroids for long term given his chronicity and recurrence of symptoms.  Continue nebs, Daliresp, incentive spirometry, supplemental oxygen Continue Incruse added during hospital stay  Wean O2 as tolerated  Respiratory viral panel negative Follows up with Dr. Ashok Cordia, pulmonology  Coronary artery disease - had an episode nonsustained SVT overnight - No chest pain  Cardiology was consulted due to slight increase in troponin and EKG with T wave inversion. Patient denies chest pain Given comorbidities and history of large AAA cardiology not recommending further evaluation and respect of ischemic heart disease, continue conservative therapy patient on aspirin, statins, BB and nitrates Recent and potassium within normal limits  Acute on chronic respiratory failure with hypoxia - Resolved O2 at baseline  On O2 at home 2L at night  Ambulatory sat  See above    Leukocytosis likely secondary to steroids Continue to monitor CBC  Essential hypertension Currently controlled Continue current treatment   Diastolic dysfunction with chronic heart failure  Continue lasix Currently appears to be euvolemic  monitor intake/output, daily weights  Steroid induced hyperglycemia - added low dose glargine and prandial novolog, plus sliding scale coverage ordered. will increase lantus for coverage   Change diet to carb modified/heart healthy Monitor CBG's   Chronic pain disorder/anxiety disorder Continue home morphine and Xanax  Tobacco abuse Tobacco cessation discussed   DVT Prophylaxis  Lovenox  Code Status: Full  Family Communication: None at bedside   Disposition Plan: Will monitor for 24 hrs if continues to improve will d/c in the am   Consultants PCCM Cardiology    Antibiotics   Anti-infectives    Start     Dose/Rate Route Frequency Ordered Stop   09/27/16 1000  levofloxacin (LEVAQUIN) tablet 500 mg     500 mg Oral Daily 09/26/16 0904 09/27/16 0804   09/21/16 1000  levofloxacin (LEVAQUIN) IVPB 500 mg  Status:  Discontinued     500 mg 100 mL/hr over 60 Minutes Intravenous Every 24 hours 09/21/16 0911 09/26/16 0904      Objective:   Vitals:   09/30/16 2328 10/01/16 0434 10/01/16 0704 10/01/16 0808  BP:   (!) 116/54   Pulse:  71 64   Resp:  18 16   Temp:   98.3 F (36.8 C)   TempSrc:   Oral   SpO2: 97% 97% 99% 94%  Weight:      Height:        Intake/Output Summary (Last 24 hours) at 10/01/16 1035 Last data filed at 10/01/16 0831  Gross per 24  hour  Intake              480 ml  Output                0 ml  Net              480 ml   Filed Weights   09/26/16 0500 09/29/16 0430 09/30/16 0500  Weight: 71.8 kg (158 lb 4.6 oz) 67.9 kg (149 lb 11.1 oz) 71.3 kg (157 lb 3 oz)    Exam GEN: Sitting up comfortable  Lung: Good air entry expiratory wheezing. Mild rhonchi throughout   Heart: S1S2 no murmus  Abd: Murmur  ausculted periumbilically  Extr: No pedal edema   Data Reviewed: I have personally reviewed following labs and imaging studies  CBC:  Recent Labs Lab 09/25/16 0917 09/26/16 0353 09/27/16 0342 09/30/16 1136  WBC 13.7* 10.5 10.6* 17.8*  HGB 13.2 12.9* 12.7* 12.4*  HCT 40.3 38.8* 40.1 36.9*  MCV 94.2 93.5 96.9 92.0  PLT 188 150 140* [id number]*   Basic Metabolic Panel:  Recent Labs Lab 09/25/16 0917 09/26/16 0353 09/27/16 0342 09/28/16 0528 09/30/16 0328 09/30/16 1136  NA 139 135 137 133*  --  137  K 4.8 4.6 5.2* 4.8  --  4.6  CL 96* 97* 96* 94*  --  94*  CO2 32 31 29 31   --  33*  GLUCOSE 188* 214* 233* 214*  --  233*  BUN 37* 38* 40* 41*  --  42*  CREATININE 1.18 1.24 1.39* 1.29*  --  1.06  CALCIUM 9.3 8.9 9.0 9.0  --  8.8*  MG  --   --   --   --  2.2  --    GFR: Estimated Creatinine Clearance: 63.2 mL/min (by C-G formula based on SCr of 1.06 mg/dL). Liver Function Tests: No results for input(s): AST, ALT, ALKPHOS, BILITOT, PROT, ALBUMIN in the last 168 hours. No results for input(s): LIPASE, AMYLASE in the last 168 hours. No results for input(s): AMMONIA in the last 168 hours. Coagulation Profile: No results for input(s): INR, PROTIME in the last 168 hours. Cardiac Enzymes:  Recent Labs Lab 09/30/16 0328 09/30/16 1136 09/30/16 1834  TROPONINI 0.03* 0.03* 0.03*   BNP (last 3 results) No results for input(s): PROBNP in the last 8760 hours. HbA1C: No results for input(s): HGBA1C in the last 72 hours. CBG:  Recent Labs Lab 09/30/16 0627 09/30/16 1129 09/30/16 1541 09/30/16 2117 10/01/16 0704  GLUCAP 212* 236* 200* 142* 167*   Lipid Profile: No results for input(s): CHOL, HDL, LDLCALC, TRIG, CHOLHDL, LDLDIRECT in the last 72 hours. Thyroid Function Tests: No results for input(s): TSH, T4TOTAL, FREET4, T3FREE, THYROIDAB in the last 72 hours. Anemia Panel: No results for input(s): VITAMINB12, FOLATE, FERRITIN, TIBC, IRON, RETICCTPCT in the last 72  hours. Urine analysis:    Component Value Date/Time   COLORURINE YELLOW 04/17/2013 1650   APPEARANCEUR CLEAR 04/17/2013 1650   LABSPEC 1.018 04/17/2013 1650   PHURINE 5.0 04/17/2013 1650   GLUCOSEU NEGATIVE 04/17/2013 1650   HGBUR NEGATIVE 04/17/2013 1650   BILIRUBINUR NEGATIVE 04/17/2013 1650   KETONESUR NEGATIVE 04/17/2013 1650   PROTEINUR NEGATIVE 04/17/2013 1650   UROBILINOGEN 0.2 04/17/2013 1650   NITRITE NEGATIVE 04/17/2013 1650   LEUKOCYTESUR NEGATIVE 04/17/2013 1650    No results found for this or any previous visit (from the past 240 hour(s)).  Radiology Studies: No results found.   Scheduled Meds: . arformoterol  15 mcg Nebulization BID  .  aspirin  325 mg Oral Daily  . atorvastatin  80 mg Oral Daily  . bisoprolol  5 mg Oral Daily  . budesonide (PULMICORT) nebulizer solution  0.5 mg Nebulization BID  . enoxaparin (LOVENOX) injection  40 mg Subcutaneous Q24H  . ferrous sulfate  325 mg Oral Q breakfast  . furosemide  20 mg Oral Daily  . guaiFENesin  1,200 mg Oral BID  . hydrALAZINE  10 mg Oral TID  . insulin aspart  0-20 Units Subcutaneous TID WC  . insulin aspart  0-5 Units Subcutaneous QHS  . insulin aspart  3 Units Subcutaneous TID WC  . insulin glargine  10 Units Subcutaneous QHS  . ipratropium-albuterol  3 mL Nebulization Q4H  . isosorbide dinitrate  15 mg Oral Daily  . multivitamin with minerals  1 tablet Oral Daily  . predniSONE  60 mg Oral Q breakfast  . roflumilast  500 mcg Oral Daily  . sodium chloride flush  3 mL Intravenous Q12H  . umeclidinium bromide  1 puff Inhalation Daily   Continuous Infusions:   LOS: 10 days    Chipper Oman MD on 10/01/2016 at 10:35 AM Pager: [email or web address] - password [email or web address]  After 7pm go to [email or web address] - password [email or web address]  And look for the night coverage person covering for me after hours  [facility] Office  [phone]

## 2016-10-02 LAB — GLUCOSE, CAPILLARY
GLUCOSE-CAPILLARY: 126 mg/dL — AB (ref 65–99)
GLUCOSE-CAPILLARY: 129 mg/dL — AB (ref 65–99)

## 2016-10-02 MED ORDER — BUDESONIDE 0.5 MG/2ML IN SUSP: 0.5000 mg | Freq: Two times a day (BID) | RESPIRATORY_TRACT | 0 refills | Status: DC

## 2016-10-02 MED ORDER — ALPRAZOLAM 0.25 MG PO TABS: 0.2500 mg | ORAL_TABLET | Freq: Three times a day (TID) | ORAL | 0 refills | Status: DC | PRN

## 2016-10-02 MED ORDER — ARFORMOTEROL TARTRATE 15 MCG/2ML IN NEBU: 15.0000 ug | INHALATION_SOLUTION | Freq: Two times a day (BID) | RESPIRATORY_TRACT | 0 refills | Status: DC

## 2016-10-02 MED ORDER — ALBUTEROL SULFATE HFA 108 (90 BASE) MCG/ACT IN AERS: 2.0000 | INHALATION_SPRAY | Freq: Four times a day (QID) | RESPIRATORY_TRACT | 6 refills | Status: AC | PRN

## 2016-10-02 MED ORDER — ALBUTEROL SULFATE (5 MG/ML) 0.5% IN NEBU: 2.5000 mg | INHALATION_SOLUTION | RESPIRATORY_TRACT | 0 refills | Status: DC | PRN

## 2016-10-02 MED ORDER — UMECLIDINIUM BROMIDE 62.5 MCG/INH IN AEPB: 1.0000 | INHALATION_SPRAY | Freq: Every day | RESPIRATORY_TRACT | 0 refills | Status: DC

## 2016-10-02 MED ORDER — IPRATROPIUM BROMIDE 0.02 % IN SOLN
0.5000 mg | RESPIRATORY_TRACT | 0 refills | Status: DC | PRN
Start: 2016-10-02 — End: 2016-10-13

## 2016-10-02 MED ORDER — PREDNISONE 10 MG PO TABS: ORAL_TABLET | ORAL | 0 refills | Status: DC

## 2016-10-02 NOTE — Progress Notes (Signed)
Reviewed discharge instructions/medication with patient.  Answered his questions.  Patient is waiting on his ride.

## 2016-10-02 NOTE — Discharge Summary (Signed)
Physician Discharge Summary  JERAULD DOMAGALSKI  [id number]  DOB: 03/20/1951  DOA: 09/21/2016 PCP: [name], MD  Admit date: 09/21/2016 Discharge date: 10/02/2016  Admitted From: Home  Disposition:  Home   Recommendations for Outpatient Follow-up:  1. Follow up with PCP in 1-2 weeks 2. Follow up with pulmonologist in 1 week  3. Please obtain BMP/CBC in one week 4. Please discuss the need for long term steroids   Equipment/Devices: Oxygen 2L [place]  Discharge Condition: Improved - high risk for re-admission  Prognosis: Poor  CODE STATUS: FULL  Diet recommendation: Heart Healthy / Carb Modified   Brief/Interim Summary: 66 y/o M with hx of CAD, severe COPD, extremely large AAA 7.1 cm follow by [facility], renal artery stenosis, CHF, left subclavian stenosis, PAF, ischemic cardiomyopathy and hx of alcohol abuse. Patient presented SOB, patient was evaluated by his pulmonology for productive cough and brown sputum. Was started on Levaquin and prednisone taper and was instructed to come to the ER Patient was admitted for COPD exacerbation with acute respiratory failure and hypoxia, started in IV steroids, pulmicort, albuterol, brovana and atrovent, nebs.   Subjective: Patient seen and examined, patient breathing has improved now he is sating at 95% on 2L nasal canula (baseline). Patient will be discharge with prednisone taper and nebulizer prescriptions.   Discharge Diagnoses/Hospital Course:  Acute exacerbation of COPD with acute on chronic bronchitis - Improving Completed a course of Levaquin,  D/c solumedrol - start prednisone 60 mg taper - I suspect that this patient will likely remain in steroids for long term given his chronicity, severity and recurrence of symptoms. - Continue until discuss with Pulmonologist  Continue Daliresp, incentive spirometry, supplemental oxygen 2L [place] Prescriptions for Pulmicort nebulizer and Brovana given  Continue Incruse added during hospital stay - 30 day  prescription given Albuterol and Atrovent refilled continue every 4-6 hours  Respiratory viral panel negative Follows up with Dr. Ashok Cordia, pulmonology within 1 week   Coronary artery disease - had an episode nonsustained SVT overnight - No chest pain  Cardiology was consulted due to slight increase in troponin and EKG with T wave inversion. Patient denies chest pain Given comorbidities and history of large AAA cardiology not recommending further evaluation of ischemic heart disease, continue conservative therapy patient on aspirin, statins, BB and nitrates Patient on Bisoprolol a selective B1 inhibitor, although some studies shows that can also have little effect on B2 receptors - consider switching to a highly selective B1 inhibitor like nebivolol  Follow up with cardiology   Acute on chronic respiratory failure with hypoxia - Resolved O2 at baseline  On O2 at home 2L at night  Ambulatory sat maintained above 90% without O2 See above   Leukocytosis likely secondary to steroids Check cbc in 1 week  Essential hypertension Currently controlled Continue current treatment   Diastolic dysfunction with chronic heart failure   Continue lasix Currently appears to be euvolemic  monitor intake/output, daily weights  Steroid induced hyperglycemia - stable  Treated with insulin - now stable Advise on low carb diet while on steroids   Chronic pain disorder/anxiety disorder Continue home morphine and Xanax Follow up with PCP   Tobacco abuse Tobacco cessation discussed   Discharge Instructions  Discharge Instructions    Call MD for:  difficulty breathing, headache or visual disturbances    Complete by:  As directed    Call MD for:  extreme fatigue    Complete by:  As directed    Call MD for:  hives    Complete by:  As directed    Call MD for:  persistant dizziness or light-headedness    Complete by:  As directed    Call MD for:  persistant nausea and vomiting    Complete by:   As directed    Call MD for:  redness, tenderness, or signs of infection (pain, swelling, redness, odor or green/yellow discharge around incision site)    Complete by:  As directed    Call MD for:  severe uncontrolled pain    Complete by:  As directed    Call MD for:  temperature >100.4    Complete by:  As directed    Diet - low sodium heart healthy    Complete by:  As directed    Discharge instructions    Complete by:  As directed    For home use only DME oxygen    Complete by:  As directed    Mode or (Route):  Nasal cannula   Liters per Minute:  2   Frequency:  Continuous (stationary and portable oxygen unit needed)   Oxygen conserving device:  Yes   Oxygen delivery system:  Gas   Increase activity slowly    Complete by:  As directed      Allergies as of 10/02/2016      Reactions   Cephalexin Anaphylaxis   REACTION: anaphylactic shock   Penicillins Hives   REACTION: rash Has patient had a PCN reaction causing immediate rash, facial/tongue/throat swelling, SOB or lightheadedness with hypotension:YES Has patient had a PCN reaction causing severe rash involving mucus membranes or skin necrosis: NO Has patient had a PCN reaction that required hospitalization NO Has patient had a PCN reaction occurring within the last 10 years: NO If all of the above answers are "NO", then may proceed with Cephalosporin use.   Vancomycin    thrombocytopenia   Beta Adrenergic Blockers Other (See Comments)   Can take bisoprolol, cannot take less selective BB due to severe copd       Medication List    STOP taking these medications   ipratropium-albuterol 0.5-2.5 (3) MG/3ML Soln Commonly known as:  DUONEB   levofloxacin 500 MG tablet Commonly known as:  LEVAQUIN     TAKE these medications   accu-chek soft touch lancets Test once daily Dx E11.9   albuterol 108 (90 Base) MCG/ACT inhaler Commonly known as:  PROVENTIL HFA;VENTOLIN HFA Inhale 2 puffs into the lungs every 6 (six) hours as  needed for wheezing or shortness of breath.   albuterol (5 MG/ML) 0.5% nebulizer solution Commonly known as:  PROVENTIL Take 0.5 mLs (2.5 mg total) by nebulization every 4 (four) hours as needed for wheezing or shortness of breath.   ALPRAZolam 0.25 MG tablet Commonly known as:  XANAX Take 1 tablet (0.25 mg total) by mouth 3 (three) times daily as needed. for anxiety What changed:  See the new instructions.   arformoterol 15 MCG/2ML Nebu Commonly known as:  BROVANA Take 2 mLs (15 mcg total) by nebulization 2 (two) times daily.   aspirin 325 MG tablet Take 325 mg by mouth daily.   atorvastatin 80 MG tablet Commonly known as:  LIPITOR Take 1 tablet (80 mg total) by mouth daily.   b complex vitamins tablet Take 1 tablet by mouth daily.   bisoprolol 5 MG tablet Commonly known as:  ZEBETA TAKE 1 TABLET EVERY DAY   budesonide 0.5 MG/2ML nebulizer solution Commonly known as:  PULMICORT Take 2 mLs (  0.5 mg total) by nebulization 2 (two) times daily.   budesonide-formoterol 160-4.5 MCG/ACT inhaler Commonly known as:  SYMBICORT Inhale 2 puffs into the lungs 2 (two) times daily.   FLUTTER Devi Use as directed.   furosemide 20 MG tablet Commonly known as:  LASIX TAKE 1 TABLET EVERY DAY   glucose blood test strip Test once daily E11.9   hydrALAZINE 10 MG tablet Commonly known as:  APRESOLINE TAKE 1 TABLET THREE TIMES DAILY   ipratropium 0.02 % nebulizer solution Commonly known as:  ATROVENT Take 2.5 mLs (0.5 mg total) by nebulization every 4 (four) hours as needed for wheezing or shortness of breath.   Iron 325 (65 Fe) MG Tabs Take 1 tablet by mouth daily.   isosorbide dinitrate 30 MG tablet Commonly known as:  ISORDIL TAKE 1/2 TABLET EVERY DAY   Krill Oil 1000 MG Caps Take 1 capsule by mouth daily.   morphine 15 MG tablet Commonly known as:  MSIR Take 1 tablet (15 mg total) by mouth 4 (four) times daily.   multivitamin per tablet Take 1 tablet by mouth  daily.   predniSONE 10 MG tablet Commonly known as:  DELTASONE Take 6 tabs by mouth for 3 days, then 5 for 3 days, 4 for 3 days, 3 for 3 days, 2 for 3 days, then 1 tab a day What changed:  additional instructions   roflumilast 500 MCG Tabs tablet Commonly known as:  DALIRESP Take 1 tablet (500 mcg total) by mouth daily.   traZODone 50 MG tablet Commonly known as:  DESYREL TAKE ONE-HALF TO ONE TABLET BY MOUTH AT BEDTIME AS NEEDED FOR SLEEP   umeclidinium bromide 62.5 MCG/INH Aepb Commonly known as:  INCRUSE ELLIPTA Inhale 1 puff into the lungs daily. Start taking on:  10/03/2016            Durable Medical Equipment        Start     Ordered   10/02/16 0000  For home use only DME oxygen    Question Answer Comment  Mode or (Route) Nasal cannula   Liters per Minute 2   Frequency Continuous (stationary and portable oxygen unit needed)   Oxygen conserving device Yes   Oxygen delivery system Gas      10/02/16 1006     Follow-up Information    Tammy Parrett, NP Follow up on 10/13/2016.   Specialty:  Pulmonary Disease Why:  Follow up with lung doctors at 9:30 AM. Contact information: [address] [place] [address] [phone]          Allergies  Allergen Reactions  . Cephalexin Anaphylaxis    REACTION: anaphylactic shock  . Penicillins Hives    REACTION: rash Has patient had a PCN reaction causing immediate rash, facial/tongue/throat swelling, SOB or lightheadedness with hypotension:YES Has patient had a PCN reaction causing severe rash involving mucus membranes or skin necrosis: NO Has patient had a PCN reaction that required hospitalization NO Has patient had a PCN reaction occurring within the last 10 years: NO If all of the above answers are "NO", then may proceed with Cephalosporin use.  . Vancomycin     thrombocytopenia  . Beta Adrenergic Blockers Other (See Comments)    Can take bisoprolol, cannot take less selective BB due to severe copd      Consultations:  Cardiology   PCCM   Procedures/Studies: Dg Chest 2 View  Result Date: 09/24/2016 CLINICAL DATA:  Sob/hx of copd EXAM: CHEST  2 VIEW COMPARISON:  09/21/2016 FINDINGS: Hyperinflation. Moderate thoracic spondylosis. Numerous leads and wires project over the chest. Midline trachea. Mild cardiomegaly. Atherosclerosis in the transverse aorta. Pulmonary artery enlargement. No pleural effusion or pneumothorax. Diffuse peribronchial thickening. No lobar consolidation. IMPRESSION: Hyperinflation and interstitial thickening, most consistent with COPD/ chronic bronchitis. Cardiomegaly. Pulmonary artery enlargement suggests pulmonary arterial hypertension. Aortic atherosclerosis. Electronically Signed   By: Abigail Miyamoto M.D.   On: 09/24/2016 17:01   Dg Chest 2 View  Result Date: 09/21/2016 CLINICAL DATA:  66 year old male with shortness of breath. History of COPD. Patient aspirated this morning. EXAM: CHEST  2 VIEW COMPARISON:  Chest radiograph dated 09/19/2016 FINDINGS: There is emphysematous changes of the lungs. There is no focal consolidation, pleural effusion, or pneumothorax. The cardiac silhouette is within normal limits. There is atherosclerotic calcification of the aortic arch. The aorta is somewhat tortuous. There is osteopenia with degenerative changes of the spine. No acute fracture. IMPRESSION: No active cardiopulmonary disease. Emphysema. Electronically Signed   By: Anner Crete M.D.   On: 09/21/2016 06:58   Dg Chest 2 View  Result Date: 09/20/2016 CLINICAL DATA:  COPD exacerbation. EXAM: CHEST  2 VIEW COMPARISON:  Chest radiographs 07/30/2016 and CT 08/30/2016 FINDINGS: The cardiac silhouette is borderline enlarged. Tortuosity and calcified atherosclerosis are again noted involving the thoracic aorta. The lungs remain hyperinflated with changes of emphysema. There is no evidence of acute airspace consolidation, edema, pleural effusion, or pneumothorax. No acute  osseous abnormality is identified. IMPRESSION: 1. COPD without evidence of acute cardiopulmonary process. 2. Aortic atherosclerosis. Electronically Signed   By: Logan Bores M.D.   On: 09/20/2016 08:27     Discharge Exam: Vitals:   10/02/16 0536 10/02/16 0819  BP: (!) 129/50 103/64  Pulse: 64   Resp: 16   Temp: 98 F (36.7 C)    Vitals:   10/02/16 0819 10/02/16 0856 10/02/16 1007 10/02/16 1009  BP: 103/64     Pulse:      Resp:      Temp:      TempSrc:      SpO2:  97% 95% 95%  Weight:      Height:        General: NAD, on O2 [place] Cardiovascular: RRR, S1/S2 +, no rubs, no gallops Respiratory: Chronic expiratory wheezing, prominent in upper lobes, good air entry, mild rhonchi throughout  Abdominal: Soft, NT, ND, bowel sounds +, Periumbilical bruit  Extremities: no edema, no cyanosis   The results of significant diagnostics from this hospitalization (including imaging, microbiology, ancillary and laboratory) are listed below for reference.     Microbiology: No results found for this or any previous visit (from the past 240 hour(s)).   Labs: BNP (last 3 results)  Recent Labs  12/31/15 0432 07/30/16 0933  BNP 158.3* 123456*   Basic Metabolic Panel:  Recent Labs Lab 09/26/16 0353 09/27/16 0342 09/28/16 0528 09/30/16 0328 09/30/16 1136 10/01/16 1551  NA 135 137 133*  --  137 135  K 4.6 5.2* 4.8  --  4.6 4.7  CL 97* 96* 94*  --  94* 96*  CO2 31 29 31   --  33* 29  GLUCOSE 214* 233* 214*  --  233* 173*  BUN 38* 40* 41*  --  42* 44*  CREATININE 1.24 1.39* 1.29*  --  1.06 1.07  CALCIUM 8.9 9.0 9.0  --  8.8* 8.9  MG  --   --   --  2.2  --  2.2   Liver Function Tests: No  results for input(s): AST, ALT, ALKPHOS, BILITOT, PROT, ALBUMIN in the last 168 hours. No results for input(s): LIPASE, AMYLASE in the last 168 hours. No results for input(s): AMMONIA in the last 168 hours. CBC:  Recent Labs Lab 09/26/16 0353 09/27/16 0342 09/30/16 1136  WBC 10.5 10.6* 17.8*   HGB 12.9* 12.7* 12.4*  HCT 38.8* 40.1 36.9*  MCV 93.5 96.9 92.0  PLT 150 140* 145*   Cardiac Enzymes:  Recent Labs Lab 09/30/16 0328 09/30/16 1136 09/30/16 1834  TROPONINI 0.03* 0.03* 0.03*   BNP: Invalid input(s): POCBNP CBG:  Recent Labs Lab 10/01/16 0704 10/01/16 1157 10/01/16 1736 10/01/16 2248 10/02/16 0626  GLUCAP 167* 187* 203* 150* 126*   D-Dimer No results for input(s): DDIMER in the last 72 hours. Hgb A1c No results for input(s): HGBA1C in the last 72 hours. Lipid Profile No results for input(s): CHOL, HDL, LDLCALC, TRIG, CHOLHDL, LDLDIRECT in the last 72 hours. Thyroid function studies No results for input(s): TSH, T4TOTAL, T3FREE, THYROIDAB in the last 72 hours.  Invalid input(s): FREET3 Anemia work up No results for input(s): VITAMINB12, FOLATE, FERRITIN, TIBC, IRON, RETICCTPCT in the last 72 hours. Urinalysis    Component Value Date/Time   COLORURINE YELLOW 04/17/2013 1650   APPEARANCEUR CLEAR 04/17/2013 1650   LABSPEC 1.018 04/17/2013 1650   PHURINE 5.0 04/17/2013 1650   GLUCOSEU NEGATIVE 04/17/2013 1650   HGBUR NEGATIVE 04/17/2013 1650   BILIRUBINUR NEGATIVE 04/17/2013 1650   KETONESUR NEGATIVE 04/17/2013 1650   PROTEINUR NEGATIVE 04/17/2013 1650   UROBILINOGEN 0.2 04/17/2013 1650   NITRITE NEGATIVE 04/17/2013 1650   LEUKOCYTESUR NEGATIVE 04/17/2013 1650   Sepsis Labs Invalid input(s): PROCALCITONIN,  WBC,  LACTICIDVEN Microbiology No results found for this or any previous visit (from the past 240 hour(s)).   Time coordinating discharge: Over 30 minutes  SIGNED:  Chipper Oman, MD  [facility] 10/02/2016, 10:11 AM  If 7PM-7AM, please contact night-coverage [email or web address] Password [email or web address]

## 2016-10-03 DIAGNOSIS — J9612 Chronic respiratory failure with hypercapnia: Secondary | ICD-10-CM

## 2016-10-04 ENCOUNTER — Other Ambulatory Visit: Payer: Self-pay | Admitting: Family Medicine

## 2016-10-04 NOTE — Telephone Encounter (Signed)
Okay to refill? 

## 2016-10-05 NOTE — Telephone Encounter (Signed)
Noted he had a Rx for Alprazolam from another provider filled. He has a controlled medication contract with me and this is a breach to this contract. We had a discussion about this when he filled Rx for Morphine after last hospital discharge.  Also last f/u appt was not re-scheduled.  Please schedule f/u appt. Thanks, BJ

## 2016-10-06 ENCOUNTER — Other Ambulatory Visit: Payer: Self-pay

## 2016-10-06 MED ORDER — ARFORMOTEROL TARTRATE 15 MCG/2ML IN NEBU: 15.0000 ug | INHALATION_SOLUTION | Freq: Two times a day (BID) | RESPIRATORY_TRACT | 2 refills | Status: DC

## 2016-10-06 NOTE — Telephone Encounter (Signed)
Rx refused, patient needs an appointment.

## 2016-10-10 ENCOUNTER — Ambulatory Visit (INDEPENDENT_AMBULATORY_CARE_PROVIDER_SITE_OTHER): Payer: Commercial Managed Care - HMO | Admitting: Family Medicine

## 2016-10-10 ENCOUNTER — Encounter: Payer: Self-pay | Admitting: Family Medicine

## 2016-10-10 VITALS — BP 138/90 | HR 90 | Resp 16 | Ht 67.0 in | Wt 158.5 lb

## 2016-10-10 DIAGNOSIS — M5417 Radiculopathy, lumbosacral region: Secondary | ICD-10-CM | POA: Diagnosis not present

## 2016-10-10 DIAGNOSIS — J441 Chronic obstructive pulmonary disease with (acute) exacerbation: Secondary | ICD-10-CM

## 2016-10-10 DIAGNOSIS — G894 Chronic pain syndrome: Secondary | ICD-10-CM | POA: Diagnosis not present

## 2016-10-10 DIAGNOSIS — M5416 Radiculopathy, lumbar region: Secondary | ICD-10-CM

## 2016-10-10 DIAGNOSIS — B370 Candidal stomatitis: Secondary | ICD-10-CM | POA: Diagnosis not present

## 2016-10-10 DIAGNOSIS — F411 Generalized anxiety disorder: Secondary | ICD-10-CM

## 2016-10-10 DIAGNOSIS — D696 Thrombocytopenia, unspecified: Secondary | ICD-10-CM

## 2016-10-10 DIAGNOSIS — G4700 Insomnia, unspecified: Secondary | ICD-10-CM

## 2016-10-10 DIAGNOSIS — R739 Hyperglycemia, unspecified: Secondary | ICD-10-CM

## 2016-10-10 DIAGNOSIS — I10 Essential (primary) hypertension: Secondary | ICD-10-CM

## 2016-10-10 MED ORDER — NYSTATIN 100000 UNIT/ML MT SUSP: 5.0000 mL | Freq: Four times a day (QID) | OROMUCOSAL | 1 refills | Status: DC

## 2016-10-10 MED ORDER — ALPRAZOLAM 0.25 MG PO TABS: 0.2500 mg | ORAL_TABLET | Freq: Three times a day (TID) | ORAL | 2 refills | Status: DC | PRN

## 2016-10-10 MED ORDER — MORPHINE SULFATE 15 MG PO TABS: 15.0000 mg | ORAL_TABLET | Freq: Four times a day (QID) | ORAL | 0 refills | Status: DC
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
## 2016-10-10 NOTE — Patient Instructions (Addendum)
A few things to remember from today's visit:   Thrush, oral - Plan: nystatin (MYCOSTATIN) 100000 UNIT/ML suspension  Lumbar back pain with radiculopathy affecting right lower extremity - Plan: morphine (MSIR) 15 MG tablet, morphine (MSIR) 15 MG tablet  Chronic pain disorder - Plan: morphine (MSIR) 15 MG tablet, morphine (MSIR) 15 MG tablet  Essential hypertension - Plan: Basic metabolic panel, CBC with Differential  Acute exacerbation of chronic obstructive pulmonary disease (COPD) ([place]) - Plan: CBC with Differential  Hyperglycemia - Plan: Hemoglobin A1c   Pain is chronic and the goal with medication is to decrease pain to a level when you are able to function, pain will not be zero. Opioid medications have many side effects: constipation, nausea, vomiting,sedation, decreased psychomotor function, urinary retention, addiction among some.  Because opioids are controlled medications refills cannot be given, you may need to pick up a prescription monthly, and undergo urine toxic screenings to verify the presence of medication in urine.  Illegal substance are not tolerated when you are taking an opioid medication for pain management. Bring your medications with you for office visits.  At some point if I feel like pain is not adequately controlled, I will recommend referral to pain specialists.     Keep appointment with pulmonologist.   Please be sure medication list is accurate. If a new problem present, please set up appointment sooner than planned today.

## 2016-10-11 LAB — BASIC METABOLIC PANEL
BUN: 36 mg/dL — ABNORMAL HIGH (ref 6–23)
CO2: 28 mEq/L (ref 19–32)
Calcium: 8.9 mg/dL (ref 8.4–10.5)
Chloride: 104 mEq/L (ref 96–112)
Creatinine, Ser: 1.15 mg/dL (ref 0.40–1.50)
GFR: 67.25 mL/min (ref >60.00)
GLUCOSE: 192 mg/dL — AB (ref 70–99)
POTASSIUM: 4.1 meq/L (ref 3.5–5.1)
SODIUM: 143 meq/L (ref 135–145)

## 2016-10-11 LAB — CBC WITH DIFFERENTIAL/PLATELET
BASOS ABS: 0 10*3/uL (ref 0.0–0.1)
Basophils Relative: 0.1 % (ref 0.0–3.0)
EOS PCT: 0 % (ref 0.0–5.0)
Eosinophils Absolute: 0 10*3/uL (ref 0.0–0.7)
HEMATOCRIT: 36.4 % — AB (ref 39.0–52.0)
HEMOGLOBIN: 12.4 g/dL — AB (ref 13.0–17.0)
LYMPHS ABS: 0.4 10*3/uL — AB (ref 0.7–4.0)
LYMPHS PCT: 2.9 % — AB (ref 12.0–46.0)
MCHC: 34 g/dL (ref 30.0–36.0)
MCV: 93.8 fl (ref 78.0–100.0)
MONOS PCT: 1.4 % — AB (ref 3.0–12.0)
Monocytes Absolute: 0.2 10*3/uL (ref 0.1–1.0)
Neutro Abs: 12 10*3/uL — ABNORMAL HIGH (ref 1.4–7.7)
Neutrophils Relative %: 95.6 % — ABNORMAL HIGH (ref 43.0–77.0)
Platelets: 96 10*3/uL — ABNORMAL LOW (ref 150.0–400.0)
RBC: 3.88 Mil/uL — ABNORMAL LOW (ref 4.22–5.81)
RDW: 16.3 % — ABNORMAL HIGH (ref 11.5–15.5)
WBC: 12.5 10*3/uL — ABNORMAL HIGH (ref 4.0–10.5)

## 2016-10-11 LAB — HEMOGLOBIN A1C: Hgb A1c MFr Bld: 7.2 % — ABNORMAL HIGH (ref 4.6–6.5)

## 2016-10-12 ENCOUNTER — Telehealth: Payer: Self-pay | Admitting: Pulmonary Disease

## 2016-10-12 MED ORDER — ARFORMOTEROL TARTRATE 15 MCG/2ML IN NEBU: 15.0000 ug | INHALATION_SOLUTION | Freq: Two times a day (BID) | RESPIRATORY_TRACT | 5 refills | Status: DC

## 2016-10-12 NOTE — Telephone Encounter (Signed)
[facility] requesting new rx for Brovana with dx code to be sent in.  This has been sent.  Nothing further needed.

## 2016-10-13 ENCOUNTER — Telehealth: Payer: Self-pay | Admitting: Adult Health

## 2016-10-13 ENCOUNTER — Ambulatory Visit (INDEPENDENT_AMBULATORY_CARE_PROVIDER_SITE_OTHER): Payer: Commercial Managed Care - HMO | Admitting: Adult Health

## 2016-10-13 ENCOUNTER — Encounter: Payer: Self-pay | Admitting: Adult Health

## 2016-10-13 DIAGNOSIS — C44612 Basal cell carcinoma of skin of right upper limb, including shoulder: Secondary | ICD-10-CM | POA: Diagnosis not present

## 2016-10-13 DIAGNOSIS — J9612 Chronic respiratory failure with hypercapnia: Secondary | ICD-10-CM | POA: Diagnosis not present

## 2016-10-13 DIAGNOSIS — Z85828 Personal history of other malignant neoplasm of skin: Secondary | ICD-10-CM | POA: Diagnosis not present

## 2016-10-13 DIAGNOSIS — D2262 Melanocytic nevi of left upper limb, including shoulder: Secondary | ICD-10-CM | POA: Diagnosis not present

## 2016-10-13 DIAGNOSIS — J449 Chronic obstructive pulmonary disease, unspecified: Secondary | ICD-10-CM

## 2016-10-13 DIAGNOSIS — D225 Melanocytic nevi of trunk: Secondary | ICD-10-CM | POA: Diagnosis not present

## 2016-10-13 DIAGNOSIS — C44311 Basal cell carcinoma of skin of nose: Secondary | ICD-10-CM | POA: Diagnosis not present

## 2016-10-13 DIAGNOSIS — L570 Actinic keratosis: Secondary | ICD-10-CM | POA: Diagnosis not present

## 2016-10-13 DIAGNOSIS — D1801 Hemangioma of skin and subcutaneous tissue: Secondary | ICD-10-CM | POA: Diagnosis not present

## 2016-10-13 MED ORDER — ALBUTEROL SULFATE (5 MG/ML) 0.5% IN NEBU: 2.5000 mg | INHALATION_SOLUTION | RESPIRATORY_TRACT | 11 refills | Status: DC | PRN

## 2016-10-13 MED ORDER — BUDESONIDE 0.5 MG/2ML IN SUSP: 0.5000 mg | Freq: Two times a day (BID) | RESPIRATORY_TRACT | 11 refills | Status: AC

## 2016-10-13 MED ORDER — ARFORMOTEROL TARTRATE 15 MCG/2ML IN NEBU: 15.0000 ug | INHALATION_SOLUTION | Freq: Two times a day (BID) | RESPIRATORY_TRACT | 0 refills | Status: DC

## 2016-10-13 MED ORDER — IPRATROPIUM BROMIDE 0.02 % IN SOLN: 0.5000 mg | Freq: Four times a day (QID) | RESPIRATORY_TRACT | 11 refills | Status: DC

## 2016-10-13 MED ORDER — IPRATROPIUM BROMIDE 0.02 % IN SOLN: 0.5000 mg | Freq: Four times a day (QID) | RESPIRATORY_TRACT | 0 refills | Status: DC

## 2016-10-13 MED ORDER — PREDNISONE 10 MG PO TABS: 10.0000 mg | ORAL_TABLET | Freq: Every day | ORAL | 1 refills | Status: DC

## 2016-10-13 MED ORDER — ARFORMOTEROL TARTRATE 15 MCG/2ML IN NEBU: 15.0000 ug | INHALATION_SOLUTION | Freq: Two times a day (BID) | RESPIRATORY_TRACT | 11 refills | Status: AC

## 2016-10-13 NOTE — Patient Instructions (Addendum)
Continue on Brovana and Budesonide Neb Twice daily  .  Continue on Ipratropium Neb Four times a day  .  Remain off  Symbicort and INCRUSE.  Taper prednisone as directed down to 10mg  daily and hold at this dose.  Follow up .Dr. Ashok Cordia in 6-8 weeks and As needed   Please contact office for sooner follow up if symptoms do not improve or worsen or seek emergency care

## 2016-10-13 NOTE — Progress Notes (Signed)
Note reviewed.   Sonia Baller Ashok Cordia, M.D. [facility] Pager:  [phone] After 3pm or if no response, call [phone] 1:02 PM 10/13/16

## 2016-10-13 NOTE — Telephone Encounter (Signed)
Spoke with pt. Advised him that Langley Gauss is working on Catering manager for him. His prescriptions are having to go through his home care company. States that he has already contacted by [place] about this. Nothing further was needed.

## 2016-10-13 NOTE — Assessment & Plan Note (Signed)
Cont on O2 At bedtime   

## 2016-10-13 NOTE — Addendum Note (Signed)
Addended by: Doroteo Glassman D on: 10/13/2016 01:48 PM   Modules accepted: Orders
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
## 2016-10-13 NOTE — Assessment & Plan Note (Signed)
Recurrent exacerbation  Will remain on low dose steroids for now -taper to 10mg  , revealuate on return may be able to slowly taper too off.  Check with DME for neb coverage.   Plan  Patient Instructions  Continue on Brovana and Budesonide Neb Twice daily  .  Continue on Ipratropium Neb Four times a day  .  Remain off  Symbicort and INCRUSE.  Taper prednisone as directed down to 10mg  daily and hold at this dose.  Follow up .Dr. Ashok Cordia in 6-8 weeks and As needed   Please contact office for sooner follow up if symptoms do not improve or worsen or seek emergency care

## 2016-10-13 NOTE — Telephone Encounter (Signed)
lmtcb x1 for pt. 

## 2016-10-13 NOTE — Telephone Encounter (Signed)
Patient returned call, CB is [phone].

## 2016-10-13 NOTE — Addendum Note (Signed)
Addended by: Doroteo Glassman D on: 10/13/2016 12:47 PM   Modules accepted: Orders

## 2016-10-15 ENCOUNTER — Encounter: Payer: Self-pay | Admitting: Family Medicine

## 2016-10-15 NOTE — Addendum Note (Signed)
Addended by: [place], Conrad Zajkowski G on: 10/15/2016 03:05 PM   Modules accepted: Orders

## 2016-10-16 DIAGNOSIS — J449 Chronic obstructive pulmonary disease, unspecified: Secondary | ICD-10-CM | POA: Diagnosis not present

## 2016-10-23 ENCOUNTER — Telehealth: Payer: Self-pay | Admitting: Pulmonary Disease

## 2016-10-23 NOTE — Telephone Encounter (Signed)
[place], was put through to a voicemail.  lmtcb X1.  Will call back.

## 2016-10-24 NOTE — Telephone Encounter (Signed)
Called and spoke to [place] at [place] and was advised that [place] needs PA, Pamala Hurry gave me number to call: [phone]. [facility] and spoke to rep and initiated PA. Turn around time is 48-72 hours. Ref # [id number].  Will hold in triage to f/u on.

## 2016-10-25 NOTE — Telephone Encounter (Signed)
Called [employer]. Pt's ID: [id number]. PA is still in clinical review. Will await PA decision.

## 2016-10-27 NOTE — Telephone Encounter (Signed)
Called [employer]. Was placed on a long hold. Will try back.
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
## 2016-10-30 ENCOUNTER — Ambulatory Visit (INDEPENDENT_AMBULATORY_CARE_PROVIDER_SITE_OTHER): Payer: Medicare HMO | Admitting: Student

## 2016-10-30 ENCOUNTER — Other Ambulatory Visit: Payer: Self-pay | Admitting: Adult Health

## 2016-10-30 ENCOUNTER — Encounter: Payer: Self-pay | Admitting: Student

## 2016-10-30 VITALS — BP 117/66 | HR 90 | Ht 67.0 in | Wt 156.4 lb

## 2016-10-30 DIAGNOSIS — I5032 Chronic diastolic (congestive) heart failure: Secondary | ICD-10-CM

## 2016-10-30 DIAGNOSIS — I480 Paroxysmal atrial fibrillation: Secondary | ICD-10-CM

## 2016-10-30 DIAGNOSIS — I2510 Atherosclerotic heart disease of native coronary artery without angina pectoris: Secondary | ICD-10-CM | POA: Diagnosis not present

## 2016-10-30 DIAGNOSIS — I2583 Coronary atherosclerosis due to lipid rich plaque: Secondary | ICD-10-CM

## 2016-10-30 DIAGNOSIS — I714 Abdominal aortic aneurysm, without rupture, unspecified: Secondary | ICD-10-CM

## 2016-10-30 DIAGNOSIS — J449 Chronic obstructive pulmonary disease, unspecified: Secondary | ICD-10-CM

## 2016-10-30 MED ORDER — FUROSEMIDE 20 MG PO TABS: 20.0000 mg | ORAL_TABLET | Freq: Every day | ORAL | 3 refills | Status: DC

## 2016-10-30 MED ORDER — FUROSEMIDE 20 MG PO TABS
20.0000 mg | ORAL_TABLET | Freq: Every day | ORAL | 3 refills | Status: DC
Start: 2016-10-30 — End: 2016-10-30

## 2016-10-30 NOTE — Patient Instructions (Signed)
Medication Instructions:  INCREASE lasix to 40mg  (2 tablets) in the morning and 20mg  (1 tablet) in the evening until swelling/edema has improved.  Once swelling has improved continue taking 20mg  (1 tablet) daily.  Labwork: Please have lab work at PCP next week (BMET).  Testing/Procedures: NONE  Follow-Up: Your physician wants you to follow-up in: 3 MONTHS WITH DR. CRENSHAW. You will receive a reminder letter in the mail two months in advance. If you don't receive a letter, please call our office to schedule the follow-up appointment.   Any Other Special Instructions Will Be Listed Below (If Applicable).     If you need a refill on your cardiac medications before your next appointment, please call your pharmacy.

## 2016-10-30 NOTE — Telephone Encounter (Signed)
Called [employer]. I was transferred to representative but the line went dead. I attempted to call back and received a fast busy signal. Will route to triage to follow up later this AM.

## 2016-10-31 NOTE — Telephone Encounter (Signed)
[employer] review dept - spoke Beth to check on status of PA for [place].  Approved 10/26/2016 - 10/26/2018 Ref # [id number]  Spoke with Huey Romans, aware that this has been approved. Nothing further needed.

## 2016-10-31 NOTE — Addendum Note (Signed)
Addended by: Leanord Asal T on: 10/31/2016 10:33 AM   Modules accepted: Orders

## 2016-10-31 NOTE — Telephone Encounter (Signed)
[name] from [place] called to follow up on Prior [phone]. No extension available.

## 2016-11-06 ENCOUNTER — Other Ambulatory Visit (INDEPENDENT_AMBULATORY_CARE_PROVIDER_SITE_OTHER): Payer: Medicare HMO

## 2016-11-06 DIAGNOSIS — D696 Thrombocytopenia, unspecified: Secondary | ICD-10-CM

## 2016-11-06 DIAGNOSIS — I10 Essential (primary) hypertension: Secondary | ICD-10-CM

## 2016-11-06 LAB — CBC
HEMATOCRIT: 36.3 % — AB (ref 39.0–52.0)
Hemoglobin: 12.3 g/dL — ABNORMAL LOW (ref 13.0–17.0)
MCHC: 33.9 g/dL (ref 30.0–36.0)
MCV: 95.6 fl (ref 78.0–100.0)
Platelets: 208 10*3/uL (ref 150.0–400.0)
RBC: 3.79 Mil/uL — ABNORMAL LOW (ref 4.22–5.81)
RDW: 17.3 % — AB (ref 11.5–15.5)
WBC: 11.7 10*3/uL — AB (ref 4.0–10.5)

## 2016-11-06 LAB — BASIC METABOLIC PANEL
BUN: 25 mg/dL — ABNORMAL HIGH (ref 6–23)
CALCIUM: 9.1 mg/dL (ref 8.4–10.5)
CHLORIDE: 103 meq/L (ref 96–112)
CO2: 32 meq/L (ref 19–32)
Creatinine, Ser: 1.18 mg/dL (ref 0.40–1.50)
GFR: 65.27 mL/min (ref >60.00)
Glucose, Bld: 154 mg/dL — ABNORMAL HIGH (ref 70–99)
Potassium: 4.3 mEq/L (ref 3.5–5.1)
SODIUM: 140 meq/L (ref 135–145)

## 2016-11-07 ENCOUNTER — Encounter: Payer: Self-pay | Admitting: Family Medicine

## 2016-11-07 ENCOUNTER — Other Ambulatory Visit: Payer: Self-pay | Admitting: Family Medicine

## 2016-11-07 DIAGNOSIS — G4700 Insomnia, unspecified: Secondary | ICD-10-CM

## 2016-11-07 DIAGNOSIS — F419 Anxiety disorder, unspecified: Secondary | ICD-10-CM

## 2016-11-08 DIAGNOSIS — Z85828 Personal history of other malignant neoplasm of skin: Secondary | ICD-10-CM | POA: Diagnosis not present

## 2016-11-08 DIAGNOSIS — C44311 Basal cell carcinoma of skin of nose: Secondary | ICD-10-CM | POA: Diagnosis not present

## 2016-11-15 DIAGNOSIS — C44612 Basal cell carcinoma of skin of right upper limb, including shoulder: Secondary | ICD-10-CM | POA: Diagnosis not present

## 2016-11-15 DIAGNOSIS — Z85828 Personal history of other malignant neoplasm of skin: Secondary | ICD-10-CM | POA: Diagnosis not present

## 2016-11-15 DIAGNOSIS — L570 Actinic keratosis: Secondary | ICD-10-CM | POA: Diagnosis not present

## 2016-11-16 DIAGNOSIS — J449 Chronic obstructive pulmonary disease, unspecified: Secondary | ICD-10-CM | POA: Diagnosis not present

## 2016-11-24 ENCOUNTER — Encounter: Payer: Self-pay | Admitting: Pulmonary Disease

## 2016-11-24 ENCOUNTER — Ambulatory Visit (INDEPENDENT_AMBULATORY_CARE_PROVIDER_SITE_OTHER): Payer: Medicare HMO | Admitting: Pulmonary Disease

## 2016-11-24 VITALS — BP 106/60 | HR 92 | Ht 67.0 in | Wt 154.6 lb

## 2016-11-24 DIAGNOSIS — R918 Other nonspecific abnormal finding of lung field: Secondary | ICD-10-CM

## 2016-11-24 DIAGNOSIS — J449 Chronic obstructive pulmonary disease, unspecified: Secondary | ICD-10-CM

## 2016-11-24 DIAGNOSIS — G4734 Idiopathic sleep related nonobstructive alveolar hypoventilation: Secondary | ICD-10-CM

## 2016-11-24 MED ORDER — ALBUTEROL SULFATE (5 MG/ML) 0.5% IN NEBU: 2.5000 mg | INHALATION_SOLUTION | RESPIRATORY_TRACT | 5 refills | Status: DC | PRN

## 2016-11-24 MED ORDER — IPRATROPIUM BROMIDE 0.02 % IN SOLN: RESPIRATORY_TRACT | 5 refills | Status: DC

## 2016-11-24 NOTE — Addendum Note (Signed)
Addended by: Benson Setting L on: 11/24/2016 03:54 PM   Modules accepted: Orders
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
## 2016-11-24 NOTE — Addendum Note (Signed)
Addended by: Marin Roberts on: 11/24/2016 03:42 PM   Modules accepted: Orders

## 2016-11-24 NOTE — Patient Instructions (Signed)
   If the swelling in your legs doesn't go down in the next couple of days let me or one of your doctors know so we can do an ultrasound to check you for a clot in your legs.  Check with your oxygen supply company to see if they can provide you with assistance with your nebulizer regimen.  I'm sending in a referral to the [facility]) to see if they can help you with the Brovana and Pulmicort nebulizer treatments.  I will see you back in April as planned or sooner if needed.

## 2016-11-28 ENCOUNTER — Other Ambulatory Visit: Payer: Self-pay | Admitting: Pharmacist

## 2016-11-28 ENCOUNTER — Other Ambulatory Visit: Payer: Self-pay | Admitting: Family Medicine

## 2016-11-28 NOTE — Patient Outreach (Signed)
[facility]) Care Management  11/28/2016  SHAYON RAMM [birth year] [id number]  Received a referral from Dr Ashok Cordia regarding patient assistance options for patient.  Successful phone outreach to patient, HIPAA details verified.    Patient reports he is presently using Brovana and budesonide nebs instead of Symbicort inhaler  Patient reports cost concerns with nebs.    Nebulization medication is covered under Medicare Part B and not Part D.  Placed phone call to [employer] [employer], representative reports Pulmicort nebs no longer on patient assistance program.  Manufacturer of [employer] does not appear to offer manufacturer patient assistance either for Brovana.    If therapeutically appropriate, consideration could be given to use of an inhaler such as ICS/LABA combination or long-acting anti-cholinergic inhaler.     Depending on inhaler selection patient assistance programs could reviewed for patient eligibility.    Patient states he has enough nebulization medication for about the next 2 weeks.   Plan:  Will route this note to Dr Ashok Cordia to update him.  Depending on if prescriber finds inhaler appropriate, and which products, [facility] Pharmacist can see if patient assistance is available.    Karrie Meres, PharmD, [place] [phone]

## 2016-12-06 ENCOUNTER — Other Ambulatory Visit: Payer: Self-pay

## 2016-12-06 ENCOUNTER — Encounter: Payer: Self-pay | Admitting: Nurse Practitioner

## 2016-12-06 ENCOUNTER — Inpatient Hospital Stay (HOSPITAL_COMMUNITY)
Admission: AD | Admit: 2016-12-06 | Discharge: 2016-12-12 | DRG: 300 | Disposition: A | Discharge status: Home / Self Care | Payer: Medicare HMO | Source: Ambulatory Visit | Attending: Cardiovascular Disease | Admitting: Cardiovascular Disease

## 2016-12-06 ENCOUNTER — Ambulatory Visit (HOSPITAL_COMMUNITY)
Admission: RE | Admit: 2016-12-06 | Discharge: 2016-12-06 | Disposition: A | Discharge status: Home / Self Care | Payer: Medicare HMO | Source: Ambulatory Visit | Attending: Nurse Practitioner | Admitting: Nurse Practitioner

## 2016-12-06 ENCOUNTER — Ambulatory Visit (INDEPENDENT_AMBULATORY_CARE_PROVIDER_SITE_OTHER): Payer: Medicare HMO | Admitting: Nurse Practitioner

## 2016-12-06 VITALS — BP 122/62 | HR 62 | Ht 67.0 in | Wt 151.8 lb

## 2016-12-06 DIAGNOSIS — I5032 Chronic diastolic (congestive) heart failure: Secondary | ICD-10-CM

## 2016-12-06 DIAGNOSIS — Z881 Allergy status to other antibiotic agents status: Secondary | ICD-10-CM | POA: Diagnosis not present

## 2016-12-06 DIAGNOSIS — Z880 Allergy status to penicillin: Secondary | ICD-10-CM

## 2016-12-06 DIAGNOSIS — Z8701 Personal history of pneumonia (recurrent): Secondary | ICD-10-CM | POA: Diagnosis not present

## 2016-12-06 DIAGNOSIS — I714 Abdominal aortic aneurysm, without rupture, unspecified: Secondary | ICD-10-CM | POA: Diagnosis present

## 2016-12-06 DIAGNOSIS — R600 Localized edema: Secondary | ICD-10-CM

## 2016-12-06 DIAGNOSIS — I480 Paroxysmal atrial fibrillation: Secondary | ICD-10-CM | POA: Diagnosis not present

## 2016-12-06 DIAGNOSIS — K219 Gastro-esophageal reflux disease without esophagitis: Secondary | ICD-10-CM | POA: Diagnosis present

## 2016-12-06 DIAGNOSIS — R0609 Other forms of dyspnea: Secondary | ICD-10-CM | POA: Diagnosis not present

## 2016-12-06 DIAGNOSIS — I2510 Atherosclerotic heart disease of native coronary artery without angina pectoris: Secondary | ICD-10-CM | POA: Diagnosis present

## 2016-12-06 DIAGNOSIS — E785 Hyperlipidemia, unspecified: Secondary | ICD-10-CM | POA: Diagnosis present

## 2016-12-06 DIAGNOSIS — I10 Essential (primary) hypertension: Secondary | ICD-10-CM | POA: Diagnosis not present

## 2016-12-06 DIAGNOSIS — I252 Old myocardial infarction: Secondary | ICD-10-CM | POA: Diagnosis not present

## 2016-12-06 DIAGNOSIS — I82401 Acute embolism and thrombosis of unspecified deep veins of right lower extremity: Principal | ICD-10-CM | POA: Diagnosis present

## 2016-12-06 DIAGNOSIS — Z955 Presence of coronary angioplasty implant and graft: Secondary | ICD-10-CM | POA: Diagnosis not present

## 2016-12-06 DIAGNOSIS — D696 Thrombocytopenia, unspecified: Secondary | ICD-10-CM | POA: Diagnosis present

## 2016-12-06 DIAGNOSIS — I5042 Chronic combined systolic (congestive) and diastolic (congestive) heart failure: Secondary | ICD-10-CM | POA: Diagnosis not present

## 2016-12-06 DIAGNOSIS — R739 Hyperglycemia, unspecified: Secondary | ICD-10-CM | POA: Diagnosis not present

## 2016-12-06 DIAGNOSIS — J449 Chronic obstructive pulmonary disease, unspecified: Secondary | ICD-10-CM | POA: Diagnosis not present

## 2016-12-06 DIAGNOSIS — Z7982 Long term (current) use of aspirin: Secondary | ICD-10-CM

## 2016-12-06 DIAGNOSIS — J441 Chronic obstructive pulmonary disease with (acute) exacerbation: Secondary | ICD-10-CM | POA: Diagnosis present

## 2016-12-06 DIAGNOSIS — Z79899 Other long term (current) drug therapy: Secondary | ICD-10-CM

## 2016-12-06 DIAGNOSIS — E7800 Pure hypercholesterolemia, unspecified: Secondary | ICD-10-CM | POA: Diagnosis not present

## 2016-12-06 DIAGNOSIS — I25119 Atherosclerotic heart disease of native coronary artery with unspecified angina pectoris: Secondary | ICD-10-CM | POA: Diagnosis not present

## 2016-12-06 DIAGNOSIS — Z87891 Personal history of nicotine dependence: Secondary | ICD-10-CM

## 2016-12-06 DIAGNOSIS — D649 Anemia, unspecified: Secondary | ICD-10-CM | POA: Diagnosis present

## 2016-12-06 DIAGNOSIS — I70209 Unspecified atherosclerosis of native arteries of extremities, unspecified extremity: Secondary | ICD-10-CM | POA: Diagnosis present

## 2016-12-06 DIAGNOSIS — I110 Hypertensive heart disease with heart failure: Secondary | ICD-10-CM | POA: Diagnosis present

## 2016-12-06 DIAGNOSIS — Z833 Family history of diabetes mellitus: Secondary | ICD-10-CM

## 2016-12-06 DIAGNOSIS — Z888 Allergy status to other drugs, medicaments and biological substances status: Secondary | ICD-10-CM

## 2016-12-06 DIAGNOSIS — Z7951 Long term (current) use of inhaled steroids: Secondary | ICD-10-CM

## 2016-12-06 DIAGNOSIS — R0602 Shortness of breath: Secondary | ICD-10-CM

## 2016-12-06 DIAGNOSIS — I255 Ischemic cardiomyopathy: Secondary | ICD-10-CM | POA: Diagnosis present

## 2016-12-06 DIAGNOSIS — Z7952 Long term (current) use of systemic steroids: Secondary | ICD-10-CM

## 2016-12-06 DIAGNOSIS — I716 Thoracoabdominal aortic aneurysm, without rupture: Secondary | ICD-10-CM | POA: Diagnosis not present

## 2016-12-06 DIAGNOSIS — T380X5A Adverse effect of glucocorticoids and synthetic analogues, initial encounter: Secondary | ICD-10-CM | POA: Diagnosis not present

## 2016-12-06 DIAGNOSIS — I472 Ventricular tachycardia: Secondary | ICD-10-CM | POA: Diagnosis not present

## 2016-12-06 DIAGNOSIS — I82409 Acute embolism and thrombosis of unspecified deep veins of unspecified lower extremity: Secondary | ICD-10-CM | POA: Diagnosis present

## 2016-12-06 DIAGNOSIS — I824Y1 Acute embolism and thrombosis of unspecified deep veins of right proximal lower extremity: Secondary | ICD-10-CM | POA: Diagnosis not present

## 2016-12-06 DIAGNOSIS — I4729 Other ventricular tachycardia: Secondary | ICD-10-CM

## 2016-12-06 DIAGNOSIS — Z8249 Family history of ischemic heart disease and other diseases of the circulatory system: Secondary | ICD-10-CM

## 2016-12-06 DIAGNOSIS — R0600 Dyspnea, unspecified: Secondary | ICD-10-CM | POA: Diagnosis present

## 2016-12-06 HISTORY — DX: Acute embolism and thrombosis of unspecified deep veins of unspecified lower extremity: I82.409

## 2016-12-06 LAB — CBC WITH DIFFERENTIAL/PLATELET
BASOS ABS: 0 10*3/uL (ref 0.0–0.1)
BASOS PCT: 0 %
EOS PCT: 0 %
Eosinophils Absolute: 0 10*3/uL (ref 0.0–0.7)
HCT: 36.3 % — ABNORMAL LOW (ref 39.0–52.0)
Hemoglobin: 11.6 g/dL — ABNORMAL LOW (ref 13.0–17.0)
Lymphocytes Relative: 7 %
Lymphs Abs: 0.6 10*3/uL — ABNORMAL LOW (ref 0.7–4.0)
MCH: 31.4 pg (ref 26.0–34.0)
MCHC: 32 g/dL (ref 30.0–36.0)
MCV: 98.1 fL (ref 78.0–100.0)
MONO ABS: 0.6 10*3/uL (ref 0.1–1.0)
Monocytes Relative: 7 %
Neutro Abs: 7.4 10*3/uL (ref 1.7–7.7)
Neutrophils Relative %: 86 %
PLATELETS: 162 10*3/uL (ref 150–400)
RBC: 3.7 MIL/uL — AB (ref 4.22–5.81)
RDW: 14.8 % (ref 11.5–15.5)
WBC: 8.6 10*3/uL (ref 4.0–10.5)

## 2016-12-06 LAB — COMPREHENSIVE METABOLIC PANEL
ALK PHOS: 99 U/L (ref 38–126)
ALT: 17 U/L (ref 17–63)
ANION GAP: 12 (ref 5–15)
AST: 22 U/L (ref 15–41)
Albumin: 3.4 g/dL — ABNORMAL LOW (ref 3.5–5.0)
BILIRUBIN TOTAL: 0.7 mg/dL (ref 0.3–1.2)
BUN: 18 mg/dL (ref 6–20)
CALCIUM: 9.5 mg/dL (ref 8.9–10.3)
CO2: 32 mmol/L (ref 22–32)
Chloride: 100 mmol/L — ABNORMAL LOW (ref 101–111)
Creatinine, Ser: 1.32 mg/dL — ABNORMAL HIGH (ref 0.61–1.24)
GFR calc Af Amer: >60 mL/min (ref >60)
GFR, EST NON AFRICAN AMERICAN: 54 mL/min — AB (ref >60)
Glucose, Bld: 126 mg/dL — ABNORMAL HIGH (ref 65–99)
POTASSIUM: 4.6 mmol/L (ref 3.5–5.1)
Sodium: 144 mmol/L (ref 135–145)
TOTAL PROTEIN: 5.9 g/dL — AB (ref 6.5–8.1)

## 2016-12-06 MED ORDER — ALPRAZOLAM 0.25 MG PO TABS
0.2500 mg | ORAL_TABLET | Freq: Three times a day (TID) | ORAL | Status: DC | PRN
  Administered 2016-12-07 – 2016-12-11 (×6): 0.25 mg via ORAL
  Filled 2016-12-06 (×6): qty 1

## 2016-12-06 MED ORDER — SODIUM CHLORIDE 0.9% FLUSH
3.0000 mL | Freq: Two times a day (BID) | INTRAVENOUS | Status: DC
  Administered 2016-12-07 – 2016-12-12 (×7): 3 mL via INTRAVENOUS

## 2016-12-06 MED ORDER — ALBUTEROL SULFATE HFA 108 (90 BASE) MCG/ACT IN AERS: 2.0000 | INHALATION_SPRAY | Freq: Four times a day (QID) | RESPIRATORY_TRACT | Status: DC | PRN

## 2016-12-06 MED ORDER — ACETAMINOPHEN 325 MG PO TABS
650.0000 mg | ORAL_TABLET | ORAL | Status: DC | PRN
Start: 2016-12-06 — End: 2016-12-12

## 2016-12-06 MED ORDER — BUDESONIDE 0.5 MG/2ML IN SUSP
0.5000 mg | Freq: Two times a day (BID) | RESPIRATORY_TRACT | Status: DC
  Administered 2016-12-06 – 2016-12-12 (×12): 0.5 mg via RESPIRATORY_TRACT
  Filled 2016-12-06 (×12): qty 2

## 2016-12-06 MED ORDER — FERROUS SULFATE 325 (65 FE) MG PO TABS
325.0000 mg | ORAL_TABLET | Freq: Every day | ORAL | Status: DC
  Administered 2016-12-07 – 2016-12-12 (×6): 325 mg via ORAL
  Filled 2016-12-06 (×6): qty 1

## 2016-12-06 MED ORDER — PREDNISONE 10 MG PO TABS
10.0000 mg | ORAL_TABLET | Freq: Every day | ORAL | Status: DC
  Administered 2016-12-07 – 2016-12-08 (×2): 10 mg via ORAL
  Filled 2016-12-06 (×2): qty 1

## 2016-12-06 MED ORDER — HYDRALAZINE HCL 10 MG PO TABS
10.0000 mg | ORAL_TABLET | Freq: Three times a day (TID) | ORAL | Status: DC
  Administered 2016-12-07 – 2016-12-12 (×16): 10 mg via ORAL
  Filled 2016-12-06 (×16): qty 1

## 2016-12-06 MED ORDER — ONDANSETRON HCL 4 MG/2ML IJ SOLN: 4.0000 mg | Freq: Four times a day (QID) | INTRAMUSCULAR | Status: DC | PRN

## 2016-12-06 MED ORDER — INSULIN ASPART 100 UNIT/ML ~~LOC~~ SOLN
0.0000 [IU] | Freq: Three times a day (TID) | SUBCUTANEOUS | Status: DC
  Administered 2016-12-09 (×2): 3 [IU] via SUBCUTANEOUS
  Administered 2016-12-09: 2 [IU] via SUBCUTANEOUS
  Administered 2016-12-10: 6 [IU] via SUBCUTANEOUS
  Administered 2016-12-10 – 2016-12-12 (×7): 3 [IU] via SUBCUTANEOUS

## 2016-12-06 MED ORDER — METOPROLOL TARTRATE 5 MG/5ML IV SOLN
INTRAVENOUS | Status: AC
  Filled 2016-12-06: qty 5

## 2016-12-06 MED ORDER — METOPROLOL TARTRATE 5 MG/5ML IV SOLN: 5.0000 mg | Freq: Once | INTRAVENOUS | Status: DC

## 2016-12-06 MED ORDER — ISOSORBIDE MONONITRATE ER 30 MG PO TB24
15.0000 mg | ORAL_TABLET | Freq: Every day | ORAL | Status: DC
  Administered 2016-12-07 – 2016-12-12 (×6): 15 mg via ORAL
  Filled 2016-12-06 (×6): qty 1

## 2016-12-06 MED ORDER — NITROGLYCERIN 0.4 MG SL SUBL: 0.4000 mg | SUBLINGUAL_TABLET | SUBLINGUAL | Status: DC | PRN

## 2016-12-06 MED ORDER — IPRATROPIUM BROMIDE 0.02 % IN SOLN
0.5000 mg | Freq: Four times a day (QID) | RESPIRATORY_TRACT | Status: DC
  Administered 2016-12-06 – 2016-12-07 (×3): 0.5 mg via RESPIRATORY_TRACT
  Filled 2016-12-06 (×3): qty 2.5

## 2016-12-06 MED ORDER — ASPIRIN EC 81 MG PO TBEC
81.0000 mg | DELAYED_RELEASE_TABLET | Freq: Every day | ORAL | Status: DC
  Administered 2016-12-07 – 2016-12-12 (×6): 81 mg via ORAL
  Filled 2016-12-06 (×6): qty 1

## 2016-12-06 MED ORDER — ROFLUMILAST 500 MCG PO TABS
500.0000 ug | ORAL_TABLET | Freq: Every day | ORAL | Status: DC
  Administered 2016-12-07 – 2016-12-12 (×6): 500 ug via ORAL
  Filled 2016-12-06 (×6): qty 1

## 2016-12-06 MED ORDER — SODIUM CHLORIDE 0.9 % IV SOLN: 250.0000 mL | INTRAVENOUS | Status: DC | PRN

## 2016-12-06 MED ORDER — IRON 325 (65 FE) MG PO TABS: 1.0000 | ORAL_TABLET | Freq: Every day | ORAL | Status: DC

## 2016-12-06 MED ORDER — MORPHINE SULFATE 15 MG PO TABS
15.0000 mg | ORAL_TABLET | Freq: Four times a day (QID) | ORAL | Status: DC
  Administered 2016-12-06 – 2016-12-12 (×23): 15 mg via ORAL
  Filled 2016-12-06 (×23): qty 1

## 2016-12-06 MED ORDER — SODIUM CHLORIDE 0.9% FLUSH: 3.0000 mL | INTRAVENOUS | Status: DC | PRN

## 2016-12-06 MED ORDER — ARFORMOTEROL TARTRATE 15 MCG/2ML IN NEBU
15.0000 ug | INHALATION_SOLUTION | Freq: Two times a day (BID) | RESPIRATORY_TRACT | Status: DC
  Administered 2016-12-06 – 2016-12-12 (×12): 15 ug via RESPIRATORY_TRACT
  Filled 2016-12-06 (×12): qty 2

## 2016-12-06 MED ORDER — ATORVASTATIN CALCIUM 80 MG PO TABS
80.0000 mg | ORAL_TABLET | Freq: Every day | ORAL | Status: DC
  Administered 2016-12-07 – 2016-12-11 (×5): 80 mg via ORAL
  Filled 2016-12-06 (×5): qty 1

## 2016-12-06 MED ORDER — BISOPROLOL FUMARATE 5 MG PO TABS
5.0000 mg | ORAL_TABLET | Freq: Every day | ORAL | Status: DC
  Administered 2016-12-07 – 2016-12-12 (×6): 5 mg via ORAL
  Filled 2016-12-06 (×6): qty 1

## 2016-12-06 MED ORDER — ALBUTEROL SULFATE (2.5 MG/3ML) 0.083% IN NEBU
2.5000 mg | INHALATION_SOLUTION | RESPIRATORY_TRACT | Status: DC | PRN
  Administered 2016-12-07 (×2): 2.5 mg via RESPIRATORY_TRACT
  Filled 2016-12-06 (×2): qty 3

## 2016-12-06 MED ORDER — TRAZODONE HCL 50 MG PO TABS
50.0000 mg | ORAL_TABLET | Freq: Every evening | ORAL | Status: DC | PRN
  Administered 2016-12-08: 50 mg via ORAL
  Filled 2016-12-06: qty 1

## 2016-12-06 MED ORDER — HEPARIN (PORCINE) IN NACL 100-0.45 UNIT/ML-% IJ SOLN
1350.0000 [IU]/h | INTRAMUSCULAR | Status: AC
  Administered 2016-12-06: 900 [IU]/h via INTRAVENOUS
  Administered 2016-12-07 – 2016-12-11 (×5): 1350 [IU]/h via INTRAVENOUS
  Filled 2016-12-06 (×7): qty 250

## 2016-12-06 MED ORDER — FUROSEMIDE 40 MG PO TABS
40.0000 mg | ORAL_TABLET | Freq: Every day | ORAL | Status: DC
  Administered 2016-12-07 – 2016-12-12 (×6): 40 mg via ORAL
  Filled 2016-12-06 (×6): qty 1

## 2016-12-06 NOTE — Progress Notes (Signed)
Pt chart reviewed.  Pt previously seen by my partner [name].  Pt admitted with DVT.  He has a known thoracoabdominal aneurysm and was referred to Dr Sammuel Hines at [facility] previously.  He is not a candidate for open repair.  Would proceed to treat his DVT with oral anticoagulation.  He will need to see Dr Sammuel Hines again for consideration of branched stent graft for thoracoabdominal aneurysm which we do not have available here.  This can be done electively.  Please call if questions.  Otherwise pt can follow up at [facility] [place] Office: [phone] Pager: [phone]

## 2016-12-06 NOTE — Progress Notes (Signed)
Pt having Tachycardia in 140s-160s sustaining. Pt complains of heartburn pain of pain scale 4/10. Placed on O2 at 2L per [place], EKG done showing Afib with RVR, VS taken, not in respiratory distress. Dr. Lucy Chris was notified and oredered to give Metoprolol 5mg  IV and to prepare a bag of 1L Normal saline at the bedside and he should be up here to check the pt.   12/06/16 2143  Vitals  BP 92/63  BP Location Left Arm  BP Method Automatic  Patient Position (if appropriate) Lying  Pulse Rate (!) 145  Pulse Rate Source Monitor  Cardiac Rhythm Atrial fibrillation  Resp 18  Oxygen Therapy  SpO2 96 %  O2 Device Nasal Cannula  O2 Flow Rate (L/min) 2 L/min  Pain Assessment  Pain Assessment 0-10  Pain Score 4  Pain Type Acute pain  Pain Descriptors / Indicators Discomfort  Pain Onset On-going  Patients Stated Pain Goal 0  Pain Intervention(s) MD notified (Comment)
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
## 2016-12-06 NOTE — Progress Notes (Signed)
Received patient direct admit from MD's office. Patient seems congested, been coughing.vital signs checked (see flowsheet) seen by PA. Will endorsed accordingly.

## 2016-12-06 NOTE — Progress Notes (Signed)
Seen and examined by Dr. Lucy Chris, Metoprolol IV was on hold, pt came back to NSR. Will monitor pt accordingly.

## 2016-12-06 NOTE — Progress Notes (Signed)
Patient seen upon initial arrival, he is alert and does not appears to be in any acute stress. Nurse is setting up the room. He has bilateral wheezing on exam, I have instructed nursing staff to give him his nebulizer. Vascular surgery will see him tomorrow.   Hilbert Corrigan PA Pager: [phone]

## 2016-12-06 NOTE — Patient Instructions (Signed)
Medication Instructions:  Your physician recommends that you continue on your current medications as directed. Please refer to the Current Medication list given to you today.  If you need a refill on your cardiac medications before your next appointment, please call your pharmacy.  Testing/Procedures: Your physician has requested that you have a lower (DVT) extremity venous duplex. This test is an ultrasound of the veins in the legs or arms. It looks at venous blood flow that carries blood from the heart to the legs or arms. Allow one hour for a Lower Venous exam. Allow thirty minutes for an Upper Venous exam. There are no restrictions or special instructions.    Follow-Up: Your physician wants you to follow-up in: [place]       Thank you for choosing [facility] at [facility]!!    CHRIS Sharolyn Douglas, NP Sharyn Lull, [place]
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
## 2016-12-07 ENCOUNTER — Encounter (HOSPITAL_COMMUNITY): Payer: Self-pay | Admitting: *Deleted

## 2016-12-07 DIAGNOSIS — I10 Essential (primary) hypertension: Secondary | ICD-10-CM

## 2016-12-07 DIAGNOSIS — I5032 Chronic diastolic (congestive) heart failure: Secondary | ICD-10-CM

## 2016-12-07 DIAGNOSIS — E7800 Pure hypercholesterolemia, unspecified: Secondary | ICD-10-CM

## 2016-12-07 DIAGNOSIS — I82401 Acute embolism and thrombosis of unspecified deep veins of right lower extremity: Principal | ICD-10-CM

## 2016-12-07 LAB — CBC
HEMATOCRIT: 35.2 % — AB (ref 39.0–52.0)
HEMOGLOBIN: 11 g/dL — AB (ref 13.0–17.0)
MCH: 30.6 pg (ref 26.0–34.0)
MCHC: 31.3 g/dL (ref 30.0–36.0)
MCV: 98.1 fL (ref 78.0–100.0)
Platelets: 145 10*3/uL — ABNORMAL LOW (ref 150–400)
RBC: 3.59 MIL/uL — AB (ref 4.22–5.81)
RDW: 14.8 % (ref 11.5–15.5)
WBC: 7.6 10*3/uL (ref 4.0–10.5)

## 2016-12-07 LAB — HEPARIN LEVEL (UNFRACTIONATED)
HEPARIN UNFRACTIONATED: 0.14 [IU]/mL — AB (ref 0.30–0.70)
HEPARIN UNFRACTIONATED: 0.34 [IU]/mL (ref 0.30–0.70)

## 2016-12-07 LAB — BASIC METABOLIC PANEL
ANION GAP: 12 (ref 5–15)
BUN: 19 mg/dL (ref 6–20)
CALCIUM: 8.9 mg/dL (ref 8.9–10.3)
CO2: 34 mmol/L — AB (ref 22–32)
Chloride: 97 mmol/L — ABNORMAL LOW (ref 101–111)
Creatinine, Ser: 1.19 mg/dL (ref 0.61–1.24)
GFR calc non Af Amer: >60 mL/min (ref >60)
GLUCOSE: 111 mg/dL — AB (ref 65–99)
POTASSIUM: 3.7 mmol/L (ref 3.5–5.1)
Sodium: 143 mmol/L (ref 135–145)

## 2016-12-07 LAB — GLUCOSE, CAPILLARY
GLUCOSE-CAPILLARY: 129 mg/dL — AB (ref 65–99)
GLUCOSE-CAPILLARY: 121 mg/dL — AB (ref 65–99)
Glucose-Capillary: 150 mg/dL — ABNORMAL HIGH (ref 65–99)
Glucose-Capillary: 77 mg/dL (ref 65–99)
Glucose-Capillary: 118 mg/dL — ABNORMAL HIGH (ref 65–99)

## 2016-12-07 MED ORDER — IPRATROPIUM-ALBUTEROL 0.5-2.5 (3) MG/3ML IN SOLN
3.0000 mL | Freq: Four times a day (QID) | RESPIRATORY_TRACT | Status: DC
  Administered 2016-12-07 – 2016-12-12 (×20): 3 mL via RESPIRATORY_TRACT
  Filled 2016-12-07 (×21): qty 3

## 2016-12-07 MED ORDER — WARFARIN - PHARMACIST DOSING INPATIENT: Freq: Every day | Status: DC

## 2016-12-07 MED ORDER — IPRATROPIUM-ALBUTEROL 0.5-2.5 (3) MG/3ML IN SOLN: 3.0000 mL | Freq: Four times a day (QID) | RESPIRATORY_TRACT | Status: DC

## 2016-12-07 MED ORDER — WARFARIN SODIUM 5 MG PO TABS: 5.0000 mg | ORAL_TABLET | Freq: Once | ORAL | Status: DC

## 2016-12-07 MED ORDER — HYDROCODONE-ACETAMINOPHEN 5-325 MG PO TABS
1.0000 | ORAL_TABLET | ORAL | Status: DC | PRN
  Administered 2016-12-07 – 2016-12-12 (×16): 1 via ORAL
  Filled 2016-12-07 (×17): qty 1

## 2016-12-07 NOTE — Progress Notes (Signed)
Paged MD for pt requesting break through pain medications states tylenol and xanax not effective for pain relief. Awaiting call back  AmeLie Hollars

## 2016-12-07 NOTE — Progress Notes (Signed)
ANTICOAGULATION CONSULT NOTE - Follow-up Consult  Pharmacy Consult for heparin and warfarin Indication: DVT  Allergies  Allergen Reactions  . Cephalexin Anaphylaxis    REACTION: anaphylactic shock  . Penicillins Hives    REACTION: rash Has patient had a PCN reaction causing immediate rash, facial/tongue/throat swelling, SOB or lightheadedness with hypotension:YES Has patient had a PCN reaction causing severe rash involving mucus membranes or skin necrosis: NO Has patient had a PCN reaction that required hospitalization NO Has patient had a PCN reaction occurring within the last 10 years: NO If all of the above answers are "NO", then may proceed with Cephalosporin use.  . Vancomycin     thrombocytopenia  . Beta Adrenergic Blockers Other (See Comments)    Can take bisoprolol, cannot take less selective BB due to severe copd    Patient Measurements: Height: 5\' 7"  (170.2 cm) Weight: 149 lb 12.8 oz (67.9 kg) IBW/kg (Calculated) : 66.1 Heparin Dosing Weight: 67 Kg  Vital Signs: Temp: 97.5 F (36.4 C) (03/08 0749) Temp Source: Oral (03/08 0749) BP: 105/65 (03/08 1709) Pulse Rate: 65 (03/08 1709)  Labs:  Recent Labs  12/06/16 1909 12/07/16 0034 12/07/16 1034  HGB 11.6* 11.0*  --   HCT 36.3* 35.2*  --   PLT 162 145*  --   HEPARINUNFRC  --  <0.10* 0.14*  CREATININE 1.32* 1.19  --     Estimated Creatinine Clearance: 56.3 mL/min (by C-G formula based on SCr of 1.19 mg/dL).  Assessment: CC/HPI: DVT in R peroneal vein  PMH: CAD, hypertension, hyperlipidemia, peripheral arterial disease, abdominal aortic aneurysm, chronic diastolic just heart failure, paroxysmal atrial fibrillation, and COPD   Anticoag: none pta, hep gtt for hx of afib with new DVT. Patient has large thoracoabdominal aneurysm (not surgical candidate) and will need to follow up with [facility] after discharge. Vascular has given approval to start warfarin at this time.   Currently on 1150 units/hr with HL  0.14  Renal: SCr 1.19   Heme/Onc: H&H 11/35.2, Plt 145  Goal of Therapy:  Heparin level 0.3-0.5 units/ml Monitor platelets by anticoagulation protocol: Yes   Plan:  Continue heparin to 1350 units/hr (no bolus d/t high risk of bleeding) Warfarin 5mg  x1 tonight Daily INR F/u 2000 HL Daily HL, CBC Monitor s/sx of bleeding  Georga Bora, PharmD Clinical Pharmacist Pager: [phone] 12/07/2016 6:21 PM

## 2016-12-07 NOTE — Progress Notes (Signed)
MD returned page stated he will place order for PRN Norco for break through pain  Paislee Szatkowski Leory Plowman

## 2016-12-07 NOTE — Progress Notes (Signed)
ANTICOAGULATION CONSULT NOTE - Follow-up Consult  Pharmacy Consult for heparin Indication: DVT  Allergies  Allergen Reactions  . Cephalexin Anaphylaxis    REACTION: anaphylactic shock  . Penicillins Hives    REACTION: rash Has patient had a PCN reaction causing immediate rash, facial/tongue/throat swelling, SOB or lightheadedness with hypotension:YES Has patient had a PCN reaction causing severe rash involving mucus membranes or skin necrosis: NO Has patient had a PCN reaction that required hospitalization NO Has patient had a PCN reaction occurring within the last 10 years: NO If all of the above answers are "NO", then may proceed with Cephalosporin use.  . Vancomycin     thrombocytopenia  . Beta Adrenergic Blockers Other (See Comments)    Can take bisoprolol, cannot take less selective BB due to severe copd    Patient Measurements: Height: 5\' 7"  (170.2 cm) Weight: 149 lb 12.8 oz (67.9 kg) IBW/kg (Calculated) : 66.1 Heparin Dosing Weight: 67 Kg  Vital Signs: Temp: 97.5 F (36.4 C) (03/08 0749) Temp Source: Oral (03/08 0749) BP: 122/62 (03/08 0938) Pulse Rate: 73 (03/08 0938)  Labs:  Recent Labs  12/06/16 1909 12/07/16 0034 12/07/16 1034  HGB 11.6* 11.0*  --   HCT 36.3* 35.2*  --   PLT 162 145*  --   HEPARINUNFRC  --  <0.10* 0.14*  CREATININE 1.32* 1.19  --     Estimated Creatinine Clearance: 56.3 mL/min (by C-G formula based on SCr of 1.19 mg/dL).  Assessment: CC/HPI: DVT in R peroneal vein  PMH: CAD, hypertension, hyperlipidemia, peripheral arterial disease, abdominal aortic aneurysm, chronic diastolic just heart failure, paroxysmal atrial fibrillation, and COPD   Anticoag: none pta, hep gtt for hx of afib with new DVT. With hx of large aneurysm (not surgical candidate) concern not a [place] candidate - vascular says ok to proceed with oral Currently on 1150 units/hr with HL 0.14  Renal: SCr 1.19   Heme/Onc: H&H 11/35.2, Plt 145  Goal of Therapy:  Heparin  level 0.3-0.5 units/ml Monitor platelets by anticoagulation protocol: Yes   Plan:  Increase heparin to 1350 units/hr (no bolus d/t high risk of bleeding) 2000 HL Daily HL, CBC Monitor s/sx of bleeding F/U plans for [facility] vs IVC filter  Levester Fresh, PharmD, BCPS, BCCCP Clinical Pharmacist Clinical phone for 12/07/2016 from 7a-3:30p: [phone] If after 3:30p, please call main pharmacy at: [phone] 12/07/2016 12:18 PM

## 2016-12-07 NOTE — Progress Notes (Signed)
Patient Name: [name] Date of Encounter: 12/07/2016  Primary Cardiologist: Dr. Clearnce Hasten Problem List     Active Problems:   DVT (deep venous thrombosis) ([place])     Subjective   He denies any chest pain or SOB  Inpatient Medications    Scheduled Meds: . arformoterol  15 mcg Nebulization BID  . aspirin EC  81 mg Oral Daily  . atorvastatin  80 mg Oral q1800  . bisoprolol  5 mg Oral Daily  . budesonide  0.5 mg Nebulization BID  . ferrous sulfate  325 mg Oral Q breakfast  . furosemide  40 mg Oral Daily  . hydrALAZINE  10 mg Oral TID  . insulin aspart  0-15 Units Subcutaneous TID WC  . ipratropium-albuterol  3 mL Nebulization QID  . isosorbide mononitrate  15 mg Oral Daily  . metoprolol  5 mg Intravenous Once  . morphine  15 mg Oral Q6H  . predniSONE  10 mg Oral Q breakfast  . roflumilast  500 mcg Oral Daily  . sodium chloride flush  3 mL Intravenous Q12H   Continuous Infusions: . heparin 1,350 Units/hr (12/07/16 1220)   PRN Meds: sodium chloride, acetaminophen, albuterol, ALPRAZolam, nitroGLYCERIN, ondansetron (ZOFRAN) IV, sodium chloride flush, traZODone   Vital Signs    Vitals:   12/07/16 0905 12/07/16 0906 12/07/16 0938 12/07/16 1410  BP:   122/62   Pulse:   73   Resp:   19   Temp:      TempSrc:      SpO2: 92% 92% 93% 97%  Weight:      Height:        Intake/Output Summary (Last 24 hours) at 12/07/16 1648 Last data filed at 12/07/16 1600  Gross per 24 hour  Intake           942.83 ml  Output              325 ml  Net           617.83 ml   Filed Weights   12/06/16 1843 12/07/16 0437  Weight: 147 lb 4.3 oz (66.8 kg) 149 lb 12.8 oz (67.9 kg)    Physical Exam    GEN: Well nourished, well developed, in no acute distress.  HEENT: Grossly normal.  Neck: Supple, no JVD, carotid bruits, or masses. Cardiac: RRR, no murmurs, rubs, or gallops. No clubbing, cyanosis, edema.  Radials/DP/PT 2+ and equal bilaterally.  Respiratory:  Respirations  regular and unlabored, clear to auscultation bilaterally. GI: Soft, nontender, nondistended, BS + x 4. MS: no deformity or atrophy. Skin: warm and dry, no rash. Neuro:  Strength and sensation are intact. Psych: AAOx3.  Normal affect.  Labs    CBC  Recent Labs  12/06/16 1909 12/07/16 0034  WBC 8.6 7.6  NEUTROABS 7.4  --   HGB 11.6* 11.0*  HCT 36.3* 35.2*  MCV 98.1 98.1  PLT 162 086*   Basic Metabolic Panel  Recent Labs  12/06/16 1909 12/07/16 0034  NA 144 143  K 4.6 3.7  CL 100* 97*  CO2 32 34*  GLUCOSE 126* 111*  BUN 18 19  CREATININE 1.32* 1.19  CALCIUM 9.5 8.9   Liver Function Tests  Recent Labs  12/06/16 1909  AST 22  ALT 17  ALKPHOS 99  BILITOT 0.7  PROT 5.9*  ALBUMIN 3.4*   No results for input(s): LIPASE, AMYLASE in the last 72 hours. Cardiac Enzymes No results for input(s): CKTOTAL, CKMB, CKMBINDEX,  TROPONINI in the last 72 hours. BNP Invalid input(s): POCBNP D-Dimer No results for input(s): DDIMER in the last 72 hours. Hemoglobin A1C No results for input(s): HGBA1C in the last 72 hours. Fasting Lipid Panel No results for input(s): CHOL, HDL, LDLCALC, TRIG, CHOLHDL, LDLDIRECT in the last 72 hours. Thyroid Function Tests No results for input(s): TSH, T4TOTAL, T3FREE, THYROIDAB in the last 72 hours.  Invalid input(s): FREET3  Telemetry    NSR - Personally Reviewed  ECG    NSR - Personally Reviewed  Radiology    No results found.  Cardiac Studies  2D echo Study Conclusions  - Left ventricle: The cavity size was normal. There was severe   focal basal and moderate concentric hypertrophy of the left   ventricle. Systolic function was normal. The estimated ejection   fraction was in the range of 55% to 60%. There is akinesis of the   apical inferior segment. Doppler parameters are consistent with   abnormal left ventricular relaxation (grade 1 diastolic   dysfunction). Doppler parameters are consistent with elevated    ventricular end-diastolic filling pressure. - Aortic valve: Trileaflet; mildly thickened, mildly calcified   leaflets. There was mild regurgitation. - Aortic root: The aortic root was mildly dilated measuring 41 mm. - Ascending aorta: The ascending aorta was not visualized. - Mitral valve: Structurally normal valve. There was no   regurgitation. - Right ventricle: The cavity size was normal. Wall thickness was   normal. Systolic function was normal. - Right atrium: The atrium was normal in size. - Tricuspid valve: There was no regurgitation. - Pulmonary arteries: Systolic pressure could not be accurately   estimated. - Inferior vena cava: The vessel was normal in size. - Pericardium, extracardiac: There was no pericardial effusion.  Patient Profile     66 year old male with a prior history of CAD, hypertension, hyperlipidemia, peripheral arterial disease, abdominal aortic aneurysm (7.1cm), chronic diastolic just heart failure, paroxysmal atrial fibrillation, and COPD admitted with persistent RLE edema but improved LLE edema after increasing diuretics and was found to have a RLE DVT.  Now admitted for treatment of DVT and evaluation by vascular best management going forward for treatment (anticoagulation vs. IVC filter) due to large AAA.  Assessment & Plan    1.  Right lower extremity edema: Patient is persistent right ankle and pedal edema despite otherwise good diuresis and reduction in weight with improvement in dyspnea.  Lower extremity ultrasound showed RLE DVT in  Office.This was discussed with Vascular surgery and it was recommended that Heparin be started.  Vascular surgery has seen the patient and recommend treatment of DVT with oral anticoagulation for now.  I do not think it is safe to start a DOAC with no reversal agent so recommend IV Heparin and warfarin load.   2. Chronic diastolic congestive heart failure: Echo in December showed normal LV function. He recently had increasing  dyspnea and lower extremity swelling that improved with a few days of higher dose of Lasix. His heart rate and blood pressure stable today. His weight is down to 151>>149 today, which is within his baseline range. Continue current dose of Lasix.   3. Coronary artery disease: Known severe LAD disease with right to left collaterals. He has had intermittent chest discomfort over the years, and this is typically worse with COPD flares and also in the setting of excess volume. Dr. Claiborne Billings recommended that if he has worsening of symptoms, we would plan on repeat ischemic evaluation with the question of whether  or not RCA disease has progressed. He remains on aspirin, statin, beta blocker therapy.  4. Essential hypertension: Stable.  Continue BB/hydralazine.   5. Hyperlipidemia: Continue statin therapy.  6. Abdominal aortic aneurysm: 7.1 cm by noncontrast CT in November. He was supposed to follow-up with [facility] earlier this year but had to cancel because he wasn't feeling well. Vascular surgery saw patient and stated that he was not candidate for open repiar in the setting of his COPD.  He was evaluated at [facility] by Dr. Sammuel Hines in the past.  Vascular recommended that he needed to see Dr Sammuel Hines again for consideration of branched stent graft for thoracoabdominal aneurysm which we do not have available here.  This can be done electively.  7. Left upper lobe lung mass: Followed by pulmonology and felt to be stable.  8. COPD: Followed closely by pulmonology.  9. PAF: He has not been on anticoagulation in the past due to AAA.  He went into rapid atrial fibrillation last night with HR in the 140-160's.  Given Metoprolol 5mg  IV and converted to NSR.  Continue Heparin and start warfarin load for DVT.  His CHADS2VASC score is 5.  Signed, Fransico Him, MD  12/07/2016, 4:48 PM

## 2016-12-07 NOTE — Clinical Social Work Note (Signed)
CSW acknowledges consult "Pt stated he is having a problem obtaining his prescribed medications sometimes." RNCM notified.  CSW signing off. Consult again if any social work needs arise.  Dayton Scrape, [place]

## 2016-12-07 NOTE — Progress Notes (Signed)
ANTICOAGULATION CONSULT NOTE - Follow Up Consult  Pharmacy Consult for heparin Indication: DVT   Labs:  Recent Labs  12/06/16 1909 12/07/16 0034 12/07/16 1034 12/07/16 2148  HGB 11.6* 11.0*  --   --   HCT 36.3* 35.2*  --   --   PLT 162 145*  --   --   HEPARINUNFRC  --  <0.10* 0.14* 0.34  CREATININE 1.32* 1.19  --   --     Assessment/Plan:  66yo male therapeutic on heparin after rate changes. Will continue gtt at current rate and confirm stable with am labs.   Wynona Neat, PharmD, BCPS  12/07/2016,11:33 PM

## 2016-12-07 NOTE — Progress Notes (Signed)
MD called and stated to discontinue all wafarin orders. She wants vascular to be consulted before bridging over. Verbal order placed to discontinue wafarin.   Plummer Matich Leory Plowman

## 2016-12-07 NOTE — Progress Notes (Signed)
Called phlebotomy, talked to [place] she said 3e27 was on her list and that she'll draw the heparin level soon  Tan Clopper Leory Plowman

## 2016-12-07 NOTE — Progress Notes (Signed)
ANTICOAGULATION CONSULT NOTE - Follow-up Consult  Pharmacy Consult for heparin Indication: DVT  Allergies  Allergen Reactions  . Cephalexin Anaphylaxis    REACTION: anaphylactic shock  . Penicillins Hives    REACTION: rash Has patient had a PCN reaction causing immediate rash, facial/tongue/throat swelling, SOB or lightheadedness with hypotension:YES Has patient had a PCN reaction causing severe rash involving mucus membranes or skin necrosis: NO Has patient had a PCN reaction that required hospitalization NO Has patient had a PCN reaction occurring within the last 10 years: NO If all of the above answers are "NO", then may proceed with Cephalosporin use.  . Vancomycin     thrombocytopenia  . Beta Adrenergic Blockers Other (See Comments)    Can take bisoprolol, cannot take less selective BB due to severe copd    Patient Measurements: Height: 5\' 7"  (170.2 cm) Weight: 147 lb 4.3 oz (66.8 kg) (scale B) IBW/kg (Calculated) : 66.1 Heparin Dosing Weight: 67 Kg  Vital Signs: Temp: 98.6 F (37 C) (03/08 0011) Temp Source: Oral (03/08 0011) BP: 110/50 (03/08 0011) Pulse Rate: 89 (03/08 0011)  Labs:  Recent Labs  12/06/16 1909 12/07/16 0034  HGB 11.6* 11.0*  HCT 36.3* 35.2*  PLT 162 145*  HEPARINUNFRC  --  <0.10*  CREATININE 1.32* 1.19    Estimated Creatinine Clearance: 56.3 mL/min (by C-G formula based on SCr of 1.19 mg/dL).  Assessment: 66 year old male with complex medical history notable for an untreated 7.1cm abdominal aortic aneurysm and paroxysmal atrial fibrillation who was found to have DVT in right peroneal vein. Patient was not on anticoagulation prior to admission and vascular surgery is considering IVC filter. Pharmacy has been asked to dose heparin for DVT treatment. Heparin level undetectable on gtt at 900 units/hr. Hgb and plt down a bit. No issues with line or bleeding reported per RN.  Goal of Therapy:  Heparin level 0.3-0.5 units/ml Monitor platelets by  anticoagulation protocol: Yes   Plan:  Increase heparin infusion to 1150 units/hr Will f/u 6hr heparin level  Sherlon Handing, PharmD, BCPS Clinical pharmacist, pager [phone] 12/07/2016 1:44 AM

## 2016-12-07 NOTE — Care Management Note (Signed)
Case Management Note  Patient Details  Name: CULLEN LAHAIE MRN: [id number] Date of Birth: [birth year]  Subjective/Objective:   Admitted with Rt lower extremity edema and CHF                Action/Plan: Patient lives at home with spouse; Primary Care Provider:  [name], MD; has private insurance with [facility] with prescription drug coverage; Home oxygen provided by [facility]; CM will continue to follow for DCP  Expected Discharge Date:   possibly 12/11/2016               Expected Discharge Plan:  Home/Self Care vs [facility]  Discharge planning Services  CM Consult     Status of Service:  In process, will continue to follow  Sherrilyn Rist [phone] 12/07/2016, 2:27 PM

## 2016-12-08 ENCOUNTER — Inpatient Hospital Stay (HOSPITAL_COMMUNITY): Payer: Medicare HMO

## 2016-12-08 DIAGNOSIS — R0609 Other forms of dyspnea: Secondary | ICD-10-CM

## 2016-12-08 DIAGNOSIS — I716 Thoracoabdominal aortic aneurysm, without rupture: Secondary | ICD-10-CM

## 2016-12-08 LAB — GLUCOSE, CAPILLARY
GLUCOSE-CAPILLARY: 103 mg/dL — AB (ref 65–99)
Glucose-Capillary: 71 mg/dL (ref 65–99)
Glucose-Capillary: 134 mg/dL — ABNORMAL HIGH (ref 65–99)
Glucose-Capillary: 116 mg/dL — ABNORMAL HIGH (ref 65–99)

## 2016-12-08 LAB — CBC
HEMATOCRIT: 34.4 % — AB (ref 39.0–52.0)
Hemoglobin: 10.9 g/dL — ABNORMAL LOW (ref 13.0–17.0)
MCH: 31.1 pg (ref 26.0–34.0)
MCHC: 31.7 g/dL (ref 30.0–36.0)
MCV: 98 fL (ref 78.0–100.0)
PLATELETS: 141 10*3/uL — AB (ref 150–400)
RBC: 3.51 MIL/uL — ABNORMAL LOW (ref 4.22–5.81)
RDW: 14.5 % (ref 11.5–15.5)
WBC: 8.8 10*3/uL (ref 4.0–10.5)

## 2016-12-08 LAB — HEPARIN LEVEL (UNFRACTIONATED): Heparin Unfractionated: 0.35 IU/mL (ref 0.30–0.70)

## 2016-12-08 LAB — PROTIME-INR
INR: 0.98
Prothrombin Time: 13 seconds (ref 11.4–15.2)

## 2016-12-08 MED ORDER — GUAIFENESIN 100 MG/5ML PO SOLN: 10.0000 mL | ORAL | Status: DC | PRN

## 2016-12-08 MED ORDER — METHYLPREDNISOLONE SODIUM SUCC 40 MG IJ SOLR
40.0000 mg | Freq: Four times a day (QID) | INTRAMUSCULAR | Status: DC
  Administered 2016-12-08 – 2016-12-12 (×16): 40 mg via INTRAVENOUS
  Filled 2016-12-08 (×16): qty 1

## 2016-12-08 NOTE — Progress Notes (Signed)
Pt had 10 beats of VTach, denies chest pain, denies nausea and vomiting, not in respiratory distress, asymptomatic. Will monitor pt accordingly.
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
## 2016-12-08 NOTE — Progress Notes (Signed)
MD returned page and stated she would be at bedside soon, aware of [place]

## 2016-12-08 NOTE — Progress Notes (Addendum)
Patient Name: [name] Date of Encounter: 12/08/2016  Primary Cardiologist: Dr. Clearnce Hasten Problem List     Active Problems:   Hyperlipidemia   Essential hypertension   Coronary artery disease   AAA (abdominal aortic aneurysm) (HCC)   Chronic diastolic heart failure (HCC)   DVT (deep venous thrombosis) (HCC)     Subjective   He denies any chest pain or SOB  Inpatient Medications    Scheduled Meds: . arformoterol  15 mcg Nebulization BID  . aspirin EC  81 mg Oral Daily  . atorvastatin  80 mg Oral q1800  . bisoprolol  5 mg Oral Daily  . budesonide  0.5 mg Nebulization BID  . ferrous sulfate  325 mg Oral Q breakfast  . furosemide  40 mg Oral Daily  . hydrALAZINE  10 mg Oral TID  . insulin aspart  0-15 Units Subcutaneous TID WC  . ipratropium-albuterol  3 mL Nebulization QID  . isosorbide mononitrate  15 mg Oral Daily  . metoprolol  5 mg Intravenous Once  . morphine  15 mg Oral Q6H  . predniSONE  10 mg Oral Q breakfast  . roflumilast  500 mcg Oral Daily  . sodium chloride flush  3 mL Intravenous Q12H   Continuous Infusions: . heparin 1,350 Units/hr (12/07/16 2009)   PRN Meds: sodium chloride, acetaminophen, albuterol, ALPRAZolam, HYDROcodone-acetaminophen, nitroGLYCERIN, ondansetron (ZOFRAN) IV, sodium chloride flush, traZODone   Vital Signs    Vitals:   12/08/16 0851 12/08/16 1005 12/08/16 1207 12/08/16 1530  BP: (!) 112/51 (!) 129/53    Pulse: 64 64    Resp: 19 16    Temp:      TempSrc:      SpO2: 95% 97% 96% 100%  Weight:      Height:        Intake/Output Summary (Last 24 hours) at 12/08/16 1643 Last data filed at 12/08/16 1500  Gross per 24 hour  Intake            550.5 ml  Output              840 ml  Net           -289.5 ml   Filed Weights   12/06/16 1843 12/07/16 0437 12/08/16 0612  Weight: 147 lb 4.3 oz (66.8 kg) 149 lb 12.8 oz (67.9 kg) 150 lb 8 oz (68.3 kg)    Physical Exam    GEN: Well nourished, well developed, in no acute  distress.  HEENT: Grossly normal.  Neck: Supple, no JVD, carotid bruits, or masses. Cardiac: RRR, no murmurs, rubs, or gallops. No clubbing, cyanosis, edema.  Radials/DP/PT 2+ and equal bilaterally.  Respiratory:  Respirations regular and unlabored, clear to auscultation bilaterally. GI: Soft, nontender, nondistended, BS + x 4. MS: no deformity or atrophy. Skin: warm and dry, no rash. Neuro:  Strength and sensation are intact. Psych: AAOx3.  Normal affect.  Labs    CBC  Recent Labs  12/06/16 1909 12/07/16 0034 12/08/16 0429  WBC 8.6 7.6 8.8  NEUTROABS 7.4  --   --   HGB 11.6* 11.0* 10.9*  HCT 36.3* 35.2* 34.4*  MCV 98.1 98.1 98.0  PLT 162 145* 664*   Basic Metabolic Panel  Recent Labs  12/06/16 1909 12/07/16 0034  NA 144 143  K 4.6 3.7  CL 100* 97*  CO2 32 34*  GLUCOSE 126* 111*  BUN 18 19  CREATININE 1.32* 1.19  CALCIUM 9.5 8.9   Liver  Function Tests  Recent Labs  12/06/16 1909  AST 22  ALT 17  ALKPHOS 99  BILITOT 0.7  PROT 5.9*  ALBUMIN 3.4*   No results for input(s): LIPASE, AMYLASE in the last 72 hours. Cardiac Enzymes No results for input(s): CKTOTAL, CKMB, CKMBINDEX, TROPONINI in the last 72 hours. BNP Invalid input(s): POCBNP D-Dimer No results for input(s): DDIMER in the last 72 hours. Hemoglobin A1C No results for input(s): HGBA1C in the last 72 hours. Fasting Lipid Panel No results for input(s): CHOL, HDL, LDLCALC, TRIG, CHOLHDL, LDLDIRECT in the last 72 hours. Thyroid Function Tests No results for input(s): TSH, T4TOTAL, T3FREE, THYROIDAB in the last 72 hours.  Invalid input(s): FREET3  Telemetry    NSR - Personally Reviewed  ECG    NSR - Personally Reviewed  Radiology    No results found.  Cardiac Studies  2D echo Study Conclusions  - Left ventricle: The cavity size was normal. There was severe   focal basal and moderate concentric hypertrophy of the left   ventricle. Systolic function was normal. The estimated  ejection   fraction was in the range of 55% to 60%. There is akinesis of the   apical inferior segment. Doppler parameters are consistent with   abnormal left ventricular relaxation (grade 1 diastolic   dysfunction). Doppler parameters are consistent with elevated   ventricular end-diastolic filling pressure. - Aortic valve: Trileaflet; mildly thickened, mildly calcified   leaflets. There was mild regurgitation. - Aortic root: The aortic root was mildly dilated measuring 41 mm. - Ascending aorta: The ascending aorta was not visualized. - Mitral valve: Structurally normal valve. There was no   regurgitation. - Right ventricle: The cavity size was normal. Wall thickness was   normal. Systolic function was normal. - Right atrium: The atrium was normal in size. - Tricuspid valve: There was no regurgitation. - Pulmonary arteries: Systolic pressure could not be accurately   estimated. - Inferior vena cava: The vessel was normal in size. - Pericardium, extracardiac: There was no pericardial effusion.  Patient Profile     66 year old male with a prior history of CAD, hypertension, hyperlipidemia, peripheral arterial disease, abdominal aortic aneurysm (7.1cm), chronic diastolic just heart failure, paroxysmal atrial fibrillation, and COPD admitted with persistent RLE edema but improved LLE edema after increasing diuretics and was found to have a RLE DVT.  Now admitted for treatment of DVT and evaluation by vascular best management going forward for treatment (anticoagulation vs. IVC filter) due to large AAA.  Assessment & Plan    1.  Right lower extremity edema: Patient is persistent right ankle and pedal edema despite otherwise good diuresis and reduction in weight with improvement in dyspnea.  Lower extremity ultrasound showed RLE DVT in  Office.This was discussed with Vascular surgery and it was recommended that Heparin be started.  Vascular surgery has seen the patient and recommend treatment of  DVT with oral anticoagulation for now.  Discussed with Dr. Stanford Breed and feel that patient should ultimately go on Pradaxa due to erratic blood thinning with coumadin with risk of spikes in blood thinning.  Pradaxa has a reversal drug now that could be used in event of acute bleed or leakage from aneurysm. Before starting oral anticoagulants, I will discuss with Dr. Scot Dock whether an IVC filter would be a better option instead of anticoagulation.  He is currently in the OR so will ask him to see patient in am to evaluate which is the better and safer option (Pradaxa vs. IVC  filter).  2. Chronic diastolic congestive heart failure: Echo in December showed normal LV function. He recently had increasing dyspnea and lower extremity swelling that improved with a few days of higher dose of Lasix. His heart rate and blood pressure stable today. His weight is down to 151>>149>>150 today, which is within his baseline range. Continue current dose of Lasix.   3. Coronary artery disease: Known severe LAD disease with right to left collaterals. He has had intermittent chest discomfort over the years, and this is typically worse with COPD flares and also in the setting of excess volume. Dr. Claiborne Billings recommended that if he has worsening of symptoms, we would plan on repeat ischemic evaluation with the question of whether or not RCA disease has progressed. He remains on aspirin, statin, beta blocker therapy.  4. Essential hypertension: Stable.  Continue BB/hydralazine.   5. Hyperlipidemia: Continue statin therapy.  6. Abdominal aortic aneurysm: 7.1 cm by noncontrast CT in November. He was supposed to follow-up with [facility] earlier this year but had to cancel because he wasn't feeling well. Vascular surgery saw patient and stated that he was not candidate for open repiar in the setting of his COPD.  He was evaluated at [facility] by Dr. Sammuel Hines in the past.  Vascular recommended that he needed to see Dr Sammuel Hines again for consideration  of branched stent graft for thoracoabdominal aneurysm which we do not have available here.  I have discussed patient with  Dr. Sammuel Hines (Vascular surgery at [facility]) to discuss further plans and possible transfer to [facility] to get this treated this hospitalization. He will not take patient on his service since his AAA is asymptomatic.  He recommends that patient make a followup appt to see him in clinic.    7. Left upper lobe lung mass: Followed by pulmonology and felt to be stable.  8. COPD: Followed closely by pulmonology.  He still has diffuse wheezing despite   9. PAF: He has not been on anticoagulation in the past due to AAA.  He went into rapid atrial fibrillation last night with HR in the 140-160's.  Given Metoprolol 5mg  IV and converted to NSR.  Continue Heparin and start warfarin load for DVT.  His CHADS2VASC score is 5.  I have spent a total of 60 minutes with patient reviewing CT scans , telemetry, EKGs, labs and examining patient, long discussion with Dr. Sammuel Hines in Vascular surgery at [facility] as well as establishing an assessment and plan that was discussed with the patient.  > 50% of time was spent in direct patient care.     Signed, Fransico Him, MD  12/08/2016, 4:43 PM

## 2016-12-08 NOTE — Progress Notes (Signed)
ANTICOAGULATION CONSULT NOTE - Follow-up Consult  Pharmacy Consult for heparin Indication: DVT  Allergies  Allergen Reactions  . Cephalexin Anaphylaxis    REACTION: anaphylactic shock  . Penicillins Hives    REACTION: rash Has patient had a PCN reaction causing immediate rash, facial/tongue/throat swelling, SOB or lightheadedness with hypotension:YES Has patient had a PCN reaction causing severe rash involving mucus membranes or skin necrosis: NO Has patient had a PCN reaction that required hospitalization NO Has patient had a PCN reaction occurring within the last 10 years: NO If all of the above answers are "NO", then may proceed with Cephalosporin use.  . Vancomycin     thrombocytopenia  . Beta Adrenergic Blockers Other (See Comments)    Can take bisoprolol, cannot take less selective BB due to severe copd    Patient Measurements: Height: 5\' 7"  (170.2 cm) Weight: 150 lb 8 oz (68.3 kg) IBW/kg (Calculated) : 66.1 Heparin Dosing Weight: 67 Kg  Vital Signs: Temp: 97.7 F (36.5 C) (03/09 0612) Temp Source: Oral (03/09 0612) BP: 134/63 (03/09 0612) Pulse Rate: 66 (03/09 0612)  Labs:  Recent Labs  12/06/16 1909  12/07/16 0034 12/07/16 1034 12/07/16 2148 12/08/16 0429  HGB 11.6*  --  11.0*  --   --  10.9*  HCT 36.3*  --  35.2*  --   --  34.4*  PLT 162  --  145*  --   --  141*  LABPROT  --   --   --   --   --  13.0  INR  --   --   --   --   --  0.98  HEPARINUNFRC  --   < > <0.10* 0.14* 0.34 0.35  CREATININE 1.32*  --  1.19  --   --   --   < > = values in this interval not displayed.  Estimated Creatinine Clearance: 56.3 mL/min (by C-G formula based on SCr of 1.19 mg/dL).  Assessment: CC/HPI: DVT in R peroneal vein  PMH: CAD, hypertension, hyperlipidemia, peripheral arterial disease, abdominal aortic aneurysm, chronic diastolic just heart failure, paroxysmal atrial fibrillation, and COPD   Anticoag: none pta, hep gtt for hx of afib with new DVT. With hx of large  aneurysm (not surgical candidate) concern not a [place] candidate - vascular says ok to proceed with oral Currently on 1350 units/hr with HL therapeutic x 2  Renal: SCr 1.19   Heme/Onc: H&H 10.9/34.4, Plt 141  Goal of Therapy:  Heparin level 0.3-0.5 units/ml Monitor platelets by anticoagulation protocol: Yes   Plan:  Continue heparin 1350 units/hr Daily HL, CBC Monitor s/sx of bleeding F/U plans for [facility] vs IVC filter  Levester Fresh, PharmD, BCPS, BCCCP Clinical Pharmacist Clinical phone for 12/08/2016 from 7a-3:30p: [phone] If after 3:30p, please call main pharmacy at: [phone] 12/08/2016 8:33 AM

## 2016-12-08 NOTE — Consult Note (Addendum)
   VASCULAR SURGERY ASSESSMENT & PLAN:  Asked by Dr. Radford Pax to re-evaluate patient. This is a patient I have seen in the past with a type IV thoracoabdominal aneurysm. He was felt to be very high risk for surgery given his history of ischemic cardiomyopathy, coronary artery disease, and significant COPD. He was he was referred to [facility] to be evaluated by Dr. Sammuel Hines and was felt to be high-risk for both open repair or for a fenestrated graft. When I saw him last in October 2017 the aneurysm had increased in size from 5.7-6.4 cm over the last 2 years.  He was admitted on 12/06/2016 with dyspnea and lower extremity edema. A duplex scan done at the [facility] office showed a DVT in the peroneal vein on the right side.  He was seen by my partner Dr. Juanda Crumble fields on 12/06/2016 and he recommended outpatient follow up with Dr. Sammuel Hines concerning his aneurysm and also noted that it was safe to anticoagulate him.  I have discussed the case with Dr. Sammuel Hines who would be happy to see him electively as an outpatient to continue further discussion of his aneurysm. He does not feel there is any contraindication to anticoagulation and given that this is only a calf vein thrombus he would only require 3 months of anticoagulation regardless.   SUBJECTIVE: leg swelling is better  PHYSICAL EXAM: Vitals:   12/08/16 1530 12/08/16 1708 12/08/16 2036 12/08/16 2108  BP:  (!) 131/58  134/66  Pulse:  60  66  Resp:  18  16  Temp:    98.3 F (36.8 C)  TempSrc:    Oral  SpO2: 100% 96% 93% 94%  Weight:      Height:       Mild right lower extremity swelling.  Abd non tender.   LABS: Lab Results  Component Value Date   WBC 8.8 12/08/2016   HGB 10.9 (L) 12/08/2016   HCT 34.4 (L) 12/08/2016   MCV 98.0 12/08/2016   PLT 141 (L) 12/08/2016   Lab Results  Component Value Date   CREATININE 1.19 12/07/2016   Lab Results  Component Value Date   INR 0.98 12/08/2016   CBG (last 3)   Recent Labs   12/08/16 1159 12/08/16 1707 12/08/16 2113  GLUCAP 134* 116* 103*    Principal Problem:   COPD exacerbation (HCC) Active Problems:   Hyperlipidemia   Essential hypertension   Coronary artery disease   AAA (abdominal aortic aneurysm) (HCC)   Chronic diastolic heart failure (HCC)   DVT (deep venous thrombosis) (HCC)   DOE (dyspnea on exertion)    Gae Gallop Beeper: [phone] 12/08/2016

## 2016-12-08 NOTE — Progress Notes (Signed)
Paged cardiology to inform of pt having vtach overnight, see previous shift note. Pt states he was awaken suddenly at 2 am but denies chest pain. Awaiting call back. Pt does not have a magnesium result.  Chan Sheahan Leory Plowman

## 2016-12-09 DIAGNOSIS — T380X5A Adverse effect of glucocorticoids and synthetic analogues, initial encounter: Secondary | ICD-10-CM

## 2016-12-09 DIAGNOSIS — I480 Paroxysmal atrial fibrillation: Secondary | ICD-10-CM

## 2016-12-09 DIAGNOSIS — R0609 Other forms of dyspnea: Secondary | ICD-10-CM

## 2016-12-09 DIAGNOSIS — J441 Chronic obstructive pulmonary disease with (acute) exacerbation: Secondary | ICD-10-CM

## 2016-12-09 LAB — CBC
HCT: 35 % — ABNORMAL LOW (ref 39.0–52.0)
HEMOGLOBIN: 11.1 g/dL — AB (ref 13.0–17.0)
MCH: 30.7 pg (ref 26.0–34.0)
MCHC: 31.7 g/dL (ref 30.0–36.0)
MCV: 97 fL (ref 78.0–100.0)
Platelets: 140 10*3/uL — ABNORMAL LOW (ref 150–400)
RBC: 3.61 MIL/uL — AB (ref 4.22–5.81)
RDW: 14 % (ref 11.5–15.5)
WBC: 5.3 10*3/uL (ref 4.0–10.5)

## 2016-12-09 LAB — GLUCOSE, CAPILLARY
GLUCOSE-CAPILLARY: 196 mg/dL — AB (ref 65–99)
GLUCOSE-CAPILLARY: 135 mg/dL — AB (ref 65–99)
Glucose-Capillary: 191 mg/dL — ABNORMAL HIGH (ref 65–99)
Glucose-Capillary: 174 mg/dL — ABNORMAL HIGH (ref 65–99)

## 2016-12-09 LAB — PROTIME-INR
INR: 0.96
Prothrombin Time: 12.8 seconds (ref 11.4–15.2)

## 2016-12-09 LAB — HEPARIN LEVEL (UNFRACTIONATED): Heparin Unfractionated: 0.43 IU/mL (ref 0.30–0.70)

## 2016-12-09 MED ORDER — DABIGATRAN ETEXILATE MESYLATE 150 MG PO CAPS
150.0000 mg | ORAL_CAPSULE | Freq: Two times a day (BID) | ORAL | Status: DC
  Administered 2016-12-11 – 2016-12-12 (×2): 150 mg via ORAL
  Filled 2016-12-09 (×2): qty 1

## 2016-12-09 NOTE — Progress Notes (Signed)
Progress Note  Patient Name: [name] Date of Encounter: 12/09/2016  Primary Cardiologist: Dr. Kirk Ruths  Subjective   No chest pain or palpitations. Leg pain improving.  Inpatient Medications    Scheduled Meds: . arformoterol  15 mcg Nebulization BID  . aspirin EC  81 mg Oral Daily  . atorvastatin  80 mg Oral q1800  . bisoprolol  5 mg Oral Daily  . budesonide  0.5 mg Nebulization BID  . ferrous sulfate  325 mg Oral Q breakfast  . furosemide  40 mg Oral Daily  . hydrALAZINE  10 mg Oral TID  . insulin aspart  0-15 Units Subcutaneous TID WC  . ipratropium-albuterol  3 mL Nebulization QID  . isosorbide mononitrate  15 mg Oral Daily  . methylPREDNISolone (SOLU-MEDROL) injection  40 mg Intravenous Q6H  . metoprolol  5 mg Intravenous Once  . morphine  15 mg Oral Q6H  . roflumilast  500 mcg Oral Daily  . sodium chloride flush  3 mL Intravenous Q12H   Continuous Infusions: . heparin 1,350 Units/hr (12/09/16 1121)   PRN Meds: sodium chloride, acetaminophen, albuterol, ALPRAZolam, guaiFENesin, HYDROcodone-acetaminophen, nitroGLYCERIN, ondansetron (ZOFRAN) IV, sodium chloride flush, traZODone   Vital Signs    Vitals:   12/09/16 0511 12/09/16 0910 12/09/16 1158 12/09/16 1217  BP: (!) 129/58   139/70  Pulse: 68   69  Resp: 18   18  Temp: 97.9 F (36.6 C)   97 F (36.1 C)  TempSrc: Oral   Oral  SpO2: 97% 97% 95% 97%  Weight: 151 lb 1.6 oz (68.5 kg)     Height:        Intake/Output Summary (Last 24 hours) at 12/09/16 1519 Last data filed at 12/09/16 1300  Gross per 24 hour  Intake           1176.5 ml  Output              700 ml  Net            476.5 ml   Filed Weights   12/07/16 0437 12/08/16 0612 12/09/16 0511  Weight: 149 lb 12.8 oz (67.9 kg) 150 lb 8 oz (68.3 kg) 151 lb 1.6 oz (68.5 kg)    Telemetry    Currently normal sinus rhythm. Personally reviewed.  Physical Exam   GEN: No acute distress.   Neck: No JVD. Cardiac: RRR, no gallop.    Respiratory: Decreased breath sounds with mild end expiratory wheezing. GI: Soft, nontender, bowel sounds present. MS:  Improving leg edema; No deformity.  Labs    Chemistry Recent Labs Lab 12/06/16 1909 12/07/16 0034  NA 144 143  K 4.6 3.7  CL 100* 97*  CO2 32 34*  GLUCOSE 126* 111*  BUN 18 19  CREATININE 1.32* 1.19  CALCIUM 9.5 8.9  PROT 5.9*  --   ALBUMIN 3.4*  --   AST 22  --   ALT 17  --   ALKPHOS 99  --   BILITOT 0.7  --   GFRNONAA 54* >60  GFRAA >60 >60  ANIONGAP 12 12     Hematology Recent Labs Lab 12/07/16 0034 12/08/16 0429 12/09/16 0715  WBC 7.6 8.8 5.3  RBC 3.59* 3.51* 3.61*  HGB 11.0* 10.9* 11.1*  HCT 35.2* 34.4* 35.0*  MCV 98.1 98.0 97.0  MCH 30.6 31.1 30.7  MCHC 31.3 31.7 31.7  RDW 14.8 14.5 14.0  PLT 145* 141* 140*    Radiology    Dg Chest  Port 1 View  Result Date: 12/08/2016 CLINICAL DATA:  Shortness of breath EXAM: PORTABLE CHEST 1 VIEW COMPARISON:  09/24/2016 FINDINGS: 2039 hours. Lungs are hyperexpanded. The lungs are clear wiithout focal pneumonia, edema, pneumothorax or pleural effusion. Interstitial markings are diffusely coarsened with chronic features. The cardio pericardial silhouette is enlarged. The visualized bony structures of the thorax are intact. Telemetry leads overlie the chest. IMPRESSION: Stable.  Hyperinflation without acute cardiopulmonary findings. Electronically Signed   By: Misty Stanley M.D.   On: 12/08/2016 21:04    Cardiac Studies   Echocardiogram 09/14/2016: Study Conclusions  - Left ventricle: The cavity size was normal. There was severe   focal basal and moderate concentric hypertrophy of the left   ventricle. Systolic function was normal. The estimated ejection   fraction was in the range of 55% to 60%. There is akinesis of the   apical inferior segment. Doppler parameters are consistent with   abnormal left ventricular relaxation (grade 1 diastolic   dysfunction). Doppler parameters are consistent  with elevated   ventricular end-diastolic filling pressure. - Aortic valve: Trileaflet; mildly thickened, mildly calcified   leaflets. There was mild regurgitation. - Aortic root: The aortic root was mildly dilated measuring 41 mm. - Ascending aorta: The ascending aorta was not visualized. - Mitral valve: Structurally normal valve. There was no   regurgitation. - Right ventricle: The cavity size was normal. Wall thickness was   normal. Systolic function was normal. - Right atrium: The atrium was normal in size. - Tricuspid valve: There was no regurgitation. - Pulmonary arteries: Systolic pressure could not be accurately   estimated. - Inferior vena cava: The vessel was normal in size. - Pericardium, extracardiac: There was no pericardial effusion.  Patient Profile     66 y.o. male with a history of CAD, COPD, hypertension, hyperlipidemia, PAD, large abdominal aortic aneurysm measuring 7.1 cm, chronic diastolic heart failure, paroxysmal atrial fibrillation, now admitted with leg edema and diagnosis of right lower extremity DVT. Given his medical complexity has been assessed by vascular surgery as well as the cardiology service, recommendations outlined in the chart. He is now on heparin with plan to transition to Coumadin and also followed by the internal medicine service.  Assessment & Plan    1. Right leg DVT. Case has been discussed with vascular surgery in light of medical complexity. Plan is heparin with transition to oral anticoagulation for a three-month course. With the patient's very large abdominal aortic aneurysm, chart indicates discussion between Dr. Radford Pax and Dr. Stanford Breed with recommendation for Pradaxa since there is a reversal agent if needed and with previous erratic INR levels on Coumadin.  2. Large abdominal aortic aneurysm measuring 7.1 cm by noncontrast CT in November 2017. Patient is asymptomatic at this time. Case has been discussed with Dr. Sammuel Hines at [facility] with plan for  outpatient follow-up at this point.  3. Chronic diastolic heart failure, clinically stable at this point.  4. CAD with severe disease in the LAD distribution associated with right-to-left collaterals. This has been managed medically. No progressive angina symptoms.  5. COPD exacerbation, followed by internal medicine.  6. Paroxysmal atrial fibrillation, currently maintaining sinus rhythm.  Currently on heparin, will plan to transition to Pradaxa as recommended by the cardiology note from yesterday and with no contraindications for anticoagulation voiced by vascular surgery consultation. Obviously patient is very medically complex and remains at risk for adverse outcomes. Additional regimen includes Lopressor, Imdur, hydralazine, Lasix, bisoprolol, and Lipitor. Stop aspirin when he gets  on Pradaxa. Will ask for pharmacy consultation regarding this change in anticoagulation.  Signed, Rozann Lesches, MD  12/09/2016, 3:19 PM

## 2016-12-09 NOTE — Progress Notes (Signed)
ANTICOAGULATION CONSULT NOTE - Follow-up Consult  Pharmacy Consult for heparin> pradaxa Indication: DVT  Allergies  Allergen Reactions  . Cephalexin Anaphylaxis    REACTION: anaphylactic shock  . Penicillins Hives    REACTION: rash Has patient had a PCN reaction causing immediate rash, facial/tongue/throat swelling, SOB or lightheadedness with hypotension:YES Has patient had a PCN reaction causing severe rash involving mucus membranes or skin necrosis: NO Has patient had a PCN reaction that required hospitalization NO Has patient had a PCN reaction occurring within the last 10 years: NO If all of the above answers are "NO", then may proceed with Cephalosporin use.  . Vancomycin     thrombocytopenia  . Beta Adrenergic Blockers Other (See Comments)    Can take bisoprolol, cannot take less selective BB due to severe copd    Patient Measurements: Height: 5\' 7"  (170.2 cm) Weight: 151 lb 1.6 oz (68.5 kg) (scale b) IBW/kg (Calculated) : 66.1 Heparin Dosing Weight: 67 Kg  Vital Signs: Temp: 97 F (36.1 C) (03/10 1217) Temp Source: Oral (03/10 1217) BP: 139/70 (03/10 1217) Pulse Rate: 69 (03/10 1217)  Labs:  Recent Labs  12/06/16 1909 12/07/16 0034  12/07/16 2148 12/08/16 0429 12/09/16 0715  HGB 11.6* 11.0*  --   --  10.9* 11.1*  HCT 36.3* 35.2*  --   --  34.4* 35.0*  PLT 162 145*  --   --  141* 140*  LABPROT  --   --   --   --  13.0 12.8  INR  --   --   --   --  0.98 0.96  HEPARINUNFRC  --  <0.10*  < > 0.34 0.35 0.43  CREATININE 1.32* 1.19  --   --   --   --   < > = values in this interval not displayed.  Estimated Creatinine Clearance: 56.3 mL/min (by C-G formula based on SCr of 1.19 mg/dL).  Assessment: 66 yo male with DVT in R peroneal vein on heparinfor hx of afib with new DVT. Pharmacy asked to transition to pradaxa. Per manufactures guidelines at least 5 days of parenteral therapy (lovenox, Heparin) is required before starting Pradaxa -heparin started 12/06/16 at  8pm) -SCr= 1.19, CrCl ~ 60  Goal of Therapy:  Heparin level 0.3-0.5 units/ml Monitor platelets by anticoagulation protocol: Yes   Plan:  -Continue heparin for a minimum of 5 days (discontinue on 3/12) -Begin pradaxa on 3/12 in the evening -Could consider lovenox  -Daily heparin level and CBC while on heparin  Hildred Laser, Pharm D 12/09/2016 3:53 PM

## 2016-12-09 NOTE — Progress Notes (Signed)
ANTICOAGULATION CONSULT NOTE - Follow-up Consult  Pharmacy Consult for heparin Indication: DVT  Allergies  Allergen Reactions  . Cephalexin Anaphylaxis    REACTION: anaphylactic shock  . Penicillins Hives    REACTION: rash Has patient had a PCN reaction causing immediate rash, facial/tongue/throat swelling, SOB or lightheadedness with hypotension:YES Has patient had a PCN reaction causing severe rash involving mucus membranes or skin necrosis: NO Has patient had a PCN reaction that required hospitalization NO Has patient had a PCN reaction occurring within the last 10 years: NO If all of the above answers are "NO", then may proceed with Cephalosporin use.  . Vancomycin     thrombocytopenia  . Beta Adrenergic Blockers Other (See Comments)    Can take bisoprolol, cannot take less selective BB due to severe copd    Patient Measurements: Height: 5\' 7"  (170.2 cm) Weight: 151 lb 1.6 oz (68.5 kg) (scale b) IBW/kg (Calculated) : 66.1 Heparin Dosing Weight: 67 Kg  Vital Signs: Temp: 97.9 F (36.6 C) (03/10 0511) Temp Source: Oral (03/10 0511) BP: 129/58 (03/10 0511) Pulse Rate: 68 (03/10 0511)  Labs:  Recent Labs  12/06/16 1909 12/07/16 0034  12/07/16 2148 12/08/16 0429 12/09/16 0715  HGB 11.6* 11.0*  --   --  10.9* 11.1*  HCT 36.3* 35.2*  --   --  34.4* 35.0*  PLT 162 145*  --   --  141* 140*  LABPROT  --   --   --   --  13.0 12.8  INR  --   --   --   --  0.98 0.96  HEPARINUNFRC  --  <0.10*  < > 0.34 0.35 0.43  CREATININE 1.32* 1.19  --   --   --   --   < > = values in this interval not displayed.  Estimated Creatinine Clearance: 56.3 mL/min (by C-G formula based on SCr of 1.19 mg/dL).  Assessment: CC/HPI: DVT in R peroneal vein  PMH: CAD, hypertension, hyperlipidemia, peripheral arterial disease, abdominal aortic aneurysm, chronic diastolic just heart failure, paroxysmal atrial fibrillation, and COPD   Anticoag: none pta, hep gtt for hx of afib with new DVT. With hx  of large aneurysm (not surgical candidate) concern not a [place] candidate Currently on 1350 units/hr with HL therapeutic x 3 at 0.43. No signs of bleeding noted.   Renal: SCr 1.19   Heme/Onc: H&H 11.1/35.0, Plt 140 - stable   Goal of Therapy:  Heparin level 0.3-0.5 units/ml Monitor platelets by anticoagulation protocol: Yes   Plan:  Continue heparin 1350 units/hr Daily heparin level, CBC Monitor s/sx of bleeding F/U plans for [facility] vs IVC filter  Uvaldo Bristle, PharmD PGY1 Pharmacy Resident Pager: [phone] 12/09/2016 10:21 AM

## 2016-12-09 NOTE — Progress Notes (Signed)
Progress Note    [name]  WCH:[id number] DOB: 13-Oct-1950  DOA: 12/06/2016 PCP: [name], MD    Brief Narrative:   Reason for consult: COPD exacerbation  Medical records reviewed and are as summarized below:  [name] is an 66 y.o. male with a PMH of severe COPD on chronic prednisone therapy, tobacco abuse since the age of 89, PAD, PAF (not on blood thinners at baseline due to large AAA), chronic diastolic CHF (EF 35-36 percent/grade 1 diastolic dysfunction by echo done 12/17), HLD on statin therapy, CAD with severe LAD disease, ischemic cardiomyopathy, 7 cm AAA, and pulmonary nodules was admitted by the cardiology service on 12/06/16 for treatment of acute right lower extremity DVT.  Assessment/Plan:   Principal Problem:   COPD exacerbation ([place]) Chest x-ray personally reviewed. No infiltrates. Hyperinflation. Continue bronchodilators, Brovana, Pulmicort, Robitussin, and Solu-Medrol 40 mg every 6 hours. Wean as tolerated. Continue pulmonary toilet with incentive spirometry. Continue supplemental oxygen. Add flutter valve.    Active Problems:   Steroid-induced hyperglycemia Manage with moderate scale SSI 3 times a day. CBGs 71-191.    Hyperlipidemia Continue Lipitor.    Essential hypertension Continue bisoprolol, Lasix, hydralazine and Imdur.    Coronary artery disease  On aspirin, beta blocker, and statin therapy.    AAA (abdominal aortic aneurysm) ([place]) Follow-up with vascular surgery as an outpatient.    Chronic diastolic heart failure (HCC) Continue Lasix.    DVT (deep venous thrombosis) (HCC) On heparin.    DOE (dyspnea on exertion) Multifactorial. Treat COPD exacerbation.  Family Communication/Anticipated D/C date and plan/Code Status   DVT prophylaxis: Heparin ordered. Code Status: Full Code.  Family Communication: No family currently at the bedside. Disposition Plan: Per primary team.   Procedures:    None  Anti-Infectives:     None  Subjective:   [name] continues to feel short of breath with wheezing and an occasional cough.  Objective:    Vitals:   12/08/16 1708 12/08/16 2036 12/08/16 2108 12/09/16 0511  BP: (!) 131/58  134/66 (!) 129/58  Pulse: 60  66 68  Resp: 18  16 18   Temp:   98.3 F (36.8 C) 97.9 F (36.6 C)  TempSrc:   Oral Oral  SpO2: 96% 93% 94% 97%  Weight:    68.5 kg (151 lb 1.6 oz)  Height:        Intake/Output Summary (Last 24 hours) at 12/09/16 0806 Last data filed at 12/09/16 0700  Gross per 24 hour  Intake           1177.5 ml  Output             1300 ml  Net           -122.5 ml   Filed Weights   12/07/16 0437 12/08/16 0612 12/09/16 0511  Weight: 67.9 kg (149 lb 12.8 oz) 68.3 kg (150 lb 8 oz) 68.5 kg (151 lb 1.6 oz)    Exam: General exam: Appears calm and comfortable.  Respiratory system: Coarse expiratory wheezes. Respiratory effort mildly labored. Cardiovascular system: S1 & S2 heard, RRR. No JVD,  rubs, gallops or clicks. No murmurs. Gastrointestinal system: Abdomen is nondistended, soft and nontender. No organomegaly or masses felt. Normal bowel sounds heard. Central nervous system: Alert and oriented. No focal neurological deficits. Extremities: Right lower extremity swollen. Skin: No rashes, lesions or ulcers. Psychiatry: Judgement and insight appear normal. Mood & affect appropriate.   Data Reviewed:   I have  personally reviewed following labs and imaging studies:  Labs: Basic Metabolic Panel:  Recent Labs Lab 12/06/16 1909 12/07/16 0034  NA 144 143  K 4.6 3.7  CL 100* 97*  CO2 32 34*  GLUCOSE 126* 111*  BUN 18 19  CREATININE 1.32* 1.19  CALCIUM 9.5 8.9   GFR Estimated Creatinine Clearance: 56.3 mL/min (by C-G formula based on SCr of 1.19 mg/dL). Liver Function Tests:  Recent Labs Lab 12/06/16 1909  AST 22  ALT 17  ALKPHOS 99  BILITOT 0.7  PROT 5.9*  ALBUMIN 3.4*   Coagulation profile  Recent Labs Lab 12/08/16 0429  12/09/16 0715  INR 0.98 0.96    CBC:  Recent Labs Lab 12/06/16 1909 12/07/16 0034 12/08/16 0429 12/09/16 0715  WBC 8.6 7.6 8.8 5.3  NEUTROABS 7.4  --   --   --   HGB 11.6* 11.0* 10.9* 11.1*  HCT 36.3* 35.2* 34.4* 35.0*  MCV 98.1 98.1 98.0 97.0  PLT 162 145* 141* 140*   CBG:  Recent Labs Lab 12/08/16 0751 12/08/16 1159 12/08/16 1707 12/08/16 2113 12/09/16 0737  GLUCAP 71 134* 116* 103* 191*    Microbiology No results found for this or any previous visit (from the past 240 hour(s)).  Radiology: Dg Chest Port 1 View  Result Date: 12/08/2016 CLINICAL DATA:  Shortness of breath EXAM: PORTABLE CHEST 1 VIEW COMPARISON:  09/24/2016 FINDINGS: 2039 hours. Lungs are hyperexpanded. The lungs are clear wiithout focal pneumonia, edema, pneumothorax or pleural effusion. Interstitial markings are diffusely coarsened with chronic features. The cardio pericardial silhouette is enlarged. The visualized bony structures of the thorax are intact. Telemetry leads overlie the chest. IMPRESSION: Stable.  Hyperinflation without acute cardiopulmonary findings. Electronically Signed   By: Misty Stanley M.D.   On: 12/08/2016 21:04    Medications:   . arformoterol  15 mcg Nebulization BID  . aspirin EC  81 mg Oral Daily  . atorvastatin  80 mg Oral q1800  . bisoprolol  5 mg Oral Daily  . budesonide  0.5 mg Nebulization BID  . ferrous sulfate  325 mg Oral Q breakfast  . furosemide  40 mg Oral Daily  . hydrALAZINE  10 mg Oral TID  . insulin aspart  0-15 Units Subcutaneous TID WC  . ipratropium-albuterol  3 mL Nebulization QID  . isosorbide mononitrate  15 mg Oral Daily  . methylPREDNISolone (SOLU-MEDROL) injection  40 mg Intravenous Q6H  . metoprolol  5 mg Intravenous Once  . morphine  15 mg Oral Q6H  . roflumilast  500 mcg Oral Daily  . sodium chloride flush  3 mL Intravenous Q12H   Continuous Infusions: . heparin 1,350 Units/hr (12/07/16 2009)    Medical decision making is of high  complexity and this patient is at high risk of deterioration, therefore this is a level 3 visit.  (> 4 problem points, >4 data points, high risk: Exacerbation of chronic illness)   LOS: 3 days   Tiersa Dayley  [facility] Pager [phone]. If unable to reach me by pager, please call my cell phone at [phone].  *Please refer to [email or web address], password [email or web address] to get updated schedule on who will round on this patient, as hospitalists switch teams weekly. If 7PM-7AM, please contact night-coverage at [email or web address], password [email or web address] for any overnight needs.  12/09/2016, 8:06 AM

## 2016-12-09 NOTE — Progress Notes (Signed)
Pt refused bed alarm on, educated on patient's safety plan, pt verbalized understanding. Will continue to do hourly rounding.

## 2016-12-10 LAB — BASIC METABOLIC PANEL
ANION GAP: 9 (ref 5–15)
BUN: 31 mg/dL — ABNORMAL HIGH (ref 6–20)
CO2: 28 mmol/L (ref 22–32)
Calcium: 8.8 mg/dL — ABNORMAL LOW (ref 8.9–10.3)
Chloride: 100 mmol/L — ABNORMAL LOW (ref 101–111)
Creatinine, Ser: 1.02 mg/dL (ref 0.61–1.24)
GFR calc Af Amer: >60 mL/min (ref >60)
Glucose, Bld: 153 mg/dL — ABNORMAL HIGH (ref 65–99)
POTASSIUM: 4.3 mmol/L (ref 3.5–5.1)
SODIUM: 137 mmol/L (ref 135–145)

## 2016-12-10 LAB — GLUCOSE, CAPILLARY
GLUCOSE-CAPILLARY: 236 mg/dL — AB (ref 65–99)
Glucose-Capillary: 157 mg/dL — ABNORMAL HIGH (ref 65–99)
Glucose-Capillary: 166 mg/dL — ABNORMAL HIGH (ref 65–99)
Glucose-Capillary: 132 mg/dL — ABNORMAL HIGH (ref 65–99)

## 2016-12-10 LAB — CBC
HCT: 33.7 % — ABNORMAL LOW (ref 39.0–52.0)
HEMOGLOBIN: 10.6 g/dL — AB (ref 13.0–17.0)
MCH: 30.3 pg (ref 26.0–34.0)
MCHC: 31.5 g/dL (ref 30.0–36.0)
MCV: 96.3 fL (ref 78.0–100.0)
Platelets: 144 10*3/uL — ABNORMAL LOW (ref 150–400)
RBC: 3.5 MIL/uL — AB (ref 4.22–5.81)
RDW: 14 % (ref 11.5–15.5)
WBC: 9.6 10*3/uL (ref 4.0–10.5)

## 2016-12-10 LAB — PROTIME-INR
INR: 1.04
PROTHROMBIN TIME: 13.6 s (ref 11.4–15.2)

## 2016-12-10 LAB — HEPARIN LEVEL (UNFRACTIONATED): Heparin Unfractionated: 0.42 IU/mL (ref 0.30–0.70)

## 2016-12-10 NOTE — Progress Notes (Signed)
ANTICOAGULATION CONSULT NOTE - Follow-up Consult  Pharmacy Consult for heparin Indication: DVT  Allergies  Allergen Reactions  . Cephalexin Anaphylaxis    REACTION: anaphylactic shock  . Penicillins Hives    REACTION: rash Has patient had a PCN reaction causing immediate rash, facial/tongue/throat swelling, SOB or lightheadedness with hypotension:YES Has patient had a PCN reaction causing severe rash involving mucus membranes or skin necrosis: NO Has patient had a PCN reaction that required hospitalization NO Has patient had a PCN reaction occurring within the last 10 years: NO If all of the above answers are "NO", then may proceed with Cephalosporin use.  . Vancomycin     thrombocytopenia  . Beta Adrenergic Blockers Other (See Comments)    Can take bisoprolol, cannot take less selective BB due to severe copd    Patient Measurements: Height: 5\' 7"  (170.2 cm) Weight: 153 lb 12.8 oz (69.8 kg) (scale b) IBW/kg (Calculated) : 66.1 Heparin Dosing Weight: 67 Kg  Vital Signs: Temp: 97.9 F (36.6 C) (03/11 0604) Temp Source: Oral (03/11 0604) BP: 142/64 (03/11 0604) Pulse Rate: 64 (03/11 0604)  Labs:  Recent Labs  12/08/16 0429 12/09/16 0715 12/10/16 0529  HGB 10.9* 11.1* 10.6*  HCT 34.4* 35.0* 33.7*  PLT 141* 140* 144*  LABPROT 13.0 12.8  --   INR 0.98 0.96  --   HEPARINUNFRC 0.35 0.43 0.42  CREATININE  --   --  1.02    Estimated Creatinine Clearance: 65.7 mL/min (by C-G formula based on SCr of 1.02 mg/dL).  Assessment: 66 yo male with DVT in R peroneal vein on heparin for hx of afib with new DVT. Pharmacy asked to transition to pradaxa. Per manufactures guidelines at least 5 days of parenteral therapy (lovenox, Heparin) is required before starting Pradaxa (Heparin started 12/06/16 at 8pm).  Heparin to be continued until 3/12 when Pradaxa will start at 2200. Stop times in. Heparin remains therapeutic this AM at 0.42 on 1350 units/hr.H/H down slightly but stable.  Platelets within normal limits. No overt signs of bleeding noted.   Goal of Therapy:  Heparin level 0.3-0.5 units/ml Monitor platelets by anticoagulation protocol: Yes   Plan:  Continue heparin 1350 units/hr Daily heparin level, CBC Monitor s/sx of bleeding Pradaxa start 3/12 Linton Hall, PharmD PGY1 Pharmacy Resident Pager: [phone] 12/10/2016 8:34 AM

## 2016-12-10 NOTE — Progress Notes (Signed)
Progress Note    [name]  XIP:[id number] DOB: Feb 02, 1951  DOA: 12/06/2016 PCP: [name], MD    Brief Narrative:   Reason for consult: COPD exacerbation  Medical records reviewed and are as summarized below:  [name] is an 66 y.o. male with a PMH of severe COPD on chronic prednisone therapy, tobacco abuse since the age of 9, PAD, PAF (not on blood thinners at baseline due to large AAA), chronic diastolic CHF (EF 67-34 percent/grade 1 diastolic dysfunction by echo done 12/17), HLD on statin therapy, CAD with severe LAD disease, ischemic cardiomyopathy, 7 cm AAA, and pulmonary nodules was admitted by the cardiology service on 12/06/16 for treatment of acute right lower extremity DVT.  Assessment/Plan:   Principal Problem:   COPD exacerbation ([place]) Chest x-ray personally reviewed. No infiltrates. Hyperinflation. Continue bronchodilators, Brovana, Pulmicort, Robitussin, and Solu-Medrol 40 mg every 6 hours. Wean as tolerated. Continue pulmonary toilet with incentive spirometry. Continue supplemental oxygen and flutter valve. No wean of steroids yet as his lung exam continues to show diffuse coarse expiratory wheezes.    Active Problems:   Steroid-induced hyperglycemia Managed with moderate scale SSI 3 times a day. CBGs 135-196.    Hyperlipidemia Continue Lipitor.    Essential hypertension Continue bisoprolol, Lasix, hydralazine and Imdur.    Coronary artery disease  On aspirin, beta blocker, and statin therapy.    AAA (abdominal aortic aneurysm) ([place]) Follow-up with vascular surgery as an outpatient.    Chronic diastolic heart failure (HCC) Continue Lasix.    DVT (deep venous thrombosis) (HCC) On heparin. Primary team plans to transition to Pradaxa tomorrow.    DOE (dyspnea on exertion) Multifactorial. Treat COPD exacerbation.  Family Communication/Anticipated D/C date and plan/Code Status   DVT prophylaxis: Heparin ordered. Code Status: Full Code.    Family Communication: No family currently at the bedside. Disposition Plan: Per primary team.   Procedures:    None  Anti-Infectives:    None  Subjective:   [name] continues to feel short of breath with wheezing and an occasional cough, but reports that he is improved somewhat since yesterday.  Objective:    Vitals:   12/09/16 1545 12/09/16 1936 12/09/16 2055 12/10/16 0604  BP:  (!) 151/61  (!) 142/64  Pulse:  69  64  Resp:  20  18  Temp:  97.9 F (36.6 C)  97.9 F (36.6 C)  TempSrc:  Oral  Oral  SpO2: 95% 95% 95% 99%  Weight:    69.8 kg (153 lb 12.8 oz)  Height:        Intake/Output Summary (Last 24 hours) at 12/10/16 0746 Last data filed at 12/10/16 1937  Gross per 24 hour  Intake           1048.5 ml  Output             1100 ml  Net            -51.5 ml   Filed Weights   12/08/16 0612 12/09/16 0511 12/10/16 0604  Weight: 68.3 kg (150 lb 8 oz) 68.5 kg (151 lb 1.6 oz) 69.8 kg (153 lb 12.8 oz)    Exam: General exam: Appears calm and comfortable.  Respiratory system: Coarse expiratory wheezes, Unchanged. Respiratory effort mildly labored. Cardiovascular system: S1 & S2 heard, RRR. No JVD,  rubs, gallops or clicks. No murmurs. Gastrointestinal system: Abdomen is nondistended, soft and nontender. No organomegaly or masses felt. Normal bowel sounds heard. Central nervous system:  Alert and oriented. No focal neurological deficits. Extremities: Right lower extremity swollen. Skin: No rashes, lesions or ulcers. Psychiatry: Judgement and insight appear normal. Mood & affect appropriate.   Data Reviewed:   I have personally reviewed following labs and imaging studies:  Labs: Basic Metabolic Panel:  Recent Labs Lab 12/06/16 1909 12/07/16 0034 12/10/16 0529  NA 144 143 137  K 4.6 3.7 4.3  CL 100* 97* 100*  CO2 32 34* 28  GLUCOSE 126* 111* 153*  BUN 18 19 31*  CREATININE 1.32* 1.19 1.02  CALCIUM 9.5 8.9 8.8*   GFR Estimated Creatinine Clearance:  65.7 mL/min (by C-G formula based on SCr of 1.02 mg/dL). Liver Function Tests:  Recent Labs Lab 12/06/16 1909  AST 22  ALT 17  ALKPHOS 99  BILITOT 0.7  PROT 5.9*  ALBUMIN 3.4*   Coagulation profile  Recent Labs Lab 12/08/16 0429 12/09/16 0715  INR 0.98 0.96    CBC:  Recent Labs Lab 12/06/16 1909 12/07/16 0034 12/08/16 0429 12/09/16 0715 12/10/16 0529  WBC 8.6 7.6 8.8 5.3 9.6  NEUTROABS 7.4  --   --   --   --   HGB 11.6* 11.0* 10.9* 11.1* 10.6*  HCT 36.3* 35.2* 34.4* 35.0* 33.7*  MCV 98.1 98.1 98.0 97.0 96.3  PLT 162 145* 141* 140* 144*   CBG:  Recent Labs Lab 12/09/16 0737 12/09/16 1137 12/09/16 1629 12/09/16 2046 12/10/16 0721  GLUCAP 191* 196* 135* 174* 157*    Microbiology No results found for this or any previous visit (from the past 240 hour(s)).  Radiology: Dg Chest Port 1 View  Result Date: 12/08/2016 CLINICAL DATA:  Shortness of breath EXAM: PORTABLE CHEST 1 VIEW COMPARISON:  09/24/2016 FINDINGS: 2039 hours. Lungs are hyperexpanded. The lungs are clear wiithout focal pneumonia, edema, pneumothorax or pleural effusion. Interstitial markings are diffusely coarsened with chronic features. The cardio pericardial silhouette is enlarged. The visualized bony structures of the thorax are intact. Telemetry leads overlie the chest. IMPRESSION: Stable.  Hyperinflation without acute cardiopulmonary findings. Electronically Signed   By: Misty Stanley M.D.   On: 12/08/2016 21:04    Medications:   . arformoterol  15 mcg Nebulization BID  . aspirin EC  81 mg Oral Daily  . atorvastatin  80 mg Oral q1800  . bisoprolol  5 mg Oral Daily  . budesonide  0.5 mg Nebulization BID  . [START ON 12/11/2016] dabigatran  150 mg Oral Q12H  . ferrous sulfate  325 mg Oral Q breakfast  . furosemide  40 mg Oral Daily  . hydrALAZINE  10 mg Oral TID  . insulin aspart  0-15 Units Subcutaneous TID WC  . ipratropium-albuterol  3 mL Nebulization QID  . isosorbide mononitrate  15  mg Oral Daily  . methylPREDNISolone (SOLU-MEDROL) injection  40 mg Intravenous Q6H  . metoprolol  5 mg Intravenous Once  . morphine  15 mg Oral Q6H  . roflumilast  500 mcg Oral Daily  . sodium chloride flush  3 mL Intravenous Q12H   Continuous Infusions: . heparin 1,350 Units/hr (12/09/16 1800)    Medical decision making is of high complexity and this patient is at high risk of deterioration, therefore this is a level 3 visit.  (> 4 problem points, 2 data points, high risk: Exacerbation of chronic illness)   LOS: 4 days   RAMA,CHRISTINA  [facility] Pager [phone]. If unable to reach me by pager, please call my cell phone at [phone].  *Please refer to [email or web address], password  [email or web address] to get updated schedule on who will round on this patient, as hospitalists switch teams weekly. If 7PM-7AM, please contact night-coverage at [email or web address], password [email or web address] for any overnight needs.  12/10/2016, 7:46 AM

## 2016-12-10 NOTE — Progress Notes (Signed)
Progress Note  Patient Name: [name] Date of Encounter: 12/10/2016  Primary Cardiologist: Dr. Kirk Ruths  Subjective   No chest pain or palpitations. Leg pain and swelling is getting better.  Inpatient Medications    Scheduled Meds: . arformoterol  15 mcg Nebulization BID  . aspirin EC  81 mg Oral Daily  . atorvastatin  80 mg Oral q1800  . bisoprolol  5 mg Oral Daily  . budesonide  0.5 mg Nebulization BID  . [START ON 12/11/2016] dabigatran  150 mg Oral Q12H  . ferrous sulfate  325 mg Oral Q breakfast  . furosemide  40 mg Oral Daily  . hydrALAZINE  10 mg Oral TID  . insulin aspart  0-15 Units Subcutaneous TID WC  . ipratropium-albuterol  3 mL Nebulization QID  . isosorbide mononitrate  15 mg Oral Daily  . methylPREDNISolone (SOLU-MEDROL) injection  40 mg Intravenous Q6H  . metoprolol  5 mg Intravenous Once  . morphine  15 mg Oral Q6H  . roflumilast  500 mcg Oral Daily  . sodium chloride flush  3 mL Intravenous Q12H   Continuous Infusions: . heparin 1,350 Units/hr (12/10/16 1000)   PRN Meds: sodium chloride, acetaminophen, albuterol, ALPRAZolam, guaiFENesin, HYDROcodone-acetaminophen, nitroGLYCERIN, ondansetron (ZOFRAN) IV, sodium chloride flush, traZODone   Vital Signs    Vitals:   12/09/16 1936 12/09/16 2055 12/10/16 0604 12/10/16 1008  BP: (!) 151/61  (!) 142/64   Pulse: 69  64   Resp: 20  18   Temp: 97.9 F (36.6 C)  97.9 F (36.6 C)   TempSrc: Oral  Oral   SpO2: 95% 95% 99% 92%  Weight:   153 lb 12.8 oz (69.8 kg)   Height:        Intake/Output Summary (Last 24 hours) at 12/10/16 1120 Last data filed at 12/10/16 1000  Gross per 24 hour  Intake             1244 ml  Output              900 ml  Net              344 ml   Filed Weights   12/08/16 0612 12/09/16 0511 12/10/16 0604  Weight: 150 lb 8 oz (68.3 kg) 151 lb 1.6 oz (68.5 kg) 153 lb 12.8 oz (69.8 kg)    Telemetry    Currently normal sinus rhythm. Personally reviewed.  Physical  Exam   GEN: No acute distress.   Neck: No JVD. Cardiac: RRR, no gallop.  Respiratory: Decreased breath sounds with mild end expiratory wheezing. GI: Soft, nontender, bowel sounds present. MS:  Improving leg edema; No deformity.  Labs    Chemistry  Recent Labs Lab 12/06/16 1909 12/07/16 0034 12/10/16 0529  NA 144 143 137  K 4.6 3.7 4.3  CL 100* 97* 100*  CO2 32 34* 28  GLUCOSE 126* 111* 153*  BUN 18 19 31*  CREATININE 1.32* 1.19 1.02  CALCIUM 9.5 8.9 8.8*  PROT 5.9*  --   --   ALBUMIN 3.4*  --   --   AST 22  --   --   ALT 17  --   --   ALKPHOS 99  --   --   BILITOT 0.7  --   --   GFRNONAA 54* >60 >60  GFRAA >60 >60 >60  ANIONGAP 12 12 9      Hematology  Recent Labs Lab 12/08/16 0429 12/09/16 0715 12/10/16 0529  WBC  8.8 5.3 9.6  RBC 3.51* 3.61* 3.50*  HGB 10.9* 11.1* 10.6*  HCT 34.4* 35.0* 33.7*  MCV 98.0 97.0 96.3  MCH 31.1 30.7 30.3  MCHC 31.7 31.7 31.5  RDW 14.5 14.0 14.0  PLT 141* 140* 144*    Radiology    Dg Chest Port 1 View  Result Date: 12/08/2016 CLINICAL DATA:  Shortness of breath EXAM: PORTABLE CHEST 1 VIEW COMPARISON:  09/24/2016 FINDINGS: 2039 hours. Lungs are hyperexpanded. The lungs are clear wiithout focal pneumonia, edema, pneumothorax or pleural effusion. Interstitial markings are diffusely coarsened with chronic features. The cardio pericardial silhouette is enlarged. The visualized bony structures of the thorax are intact. Telemetry leads overlie the chest. IMPRESSION: Stable.  Hyperinflation without acute cardiopulmonary findings. Electronically Signed   By: Misty Stanley M.D.   On: 12/08/2016 21:04    Cardiac Studies   Echocardiogram 09/14/2016: Study Conclusions  - Left ventricle: The cavity size was normal. There was severe   focal basal and moderate concentric hypertrophy of the left   ventricle. Systolic function was normal. The estimated ejection   fraction was in the range of 55% to 60%. There is akinesis of the   apical  inferior segment. Doppler parameters are consistent with   abnormal left ventricular relaxation (grade 1 diastolic   dysfunction). Doppler parameters are consistent with elevated   ventricular end-diastolic filling pressure. - Aortic valve: Trileaflet; mildly thickened, mildly calcified   leaflets. There was mild regurgitation. - Aortic root: The aortic root was mildly dilated measuring 41 mm. - Ascending aorta: The ascending aorta was not visualized. - Mitral valve: Structurally normal valve. There was no   regurgitation. - Right ventricle: The cavity size was normal. Wall thickness was   normal. Systolic function was normal. - Right atrium: The atrium was normal in size. - Tricuspid valve: There was no regurgitation. - Pulmonary arteries: Systolic pressure could not be accurately   estimated. - Inferior vena cava: The vessel was normal in size. - Pericardium, extracardiac: There was no pericardial effusion.  Patient Profile     66 y.o. male with a history of CAD, COPD, hypertension, hyperlipidemia, PAD, large abdominal aortic aneurysm measuring 7.1 cm, chronic diastolic heart failure, paroxysmal atrial fibrillation, now admitted with leg edema and diagnosis of right lower extremity DVT. Given his medical complexity has been assessed by vascular surgery as well as the cardiology service, recommendations outlined in the chart. He is now on heparin with plan to transition to Coumadin and also followed by the internal medicine service.  Assessment & Plan    1. Right leg DVT. Case has been discussed with vascular surgery in light of medical complexity. Plan is heparin with transition to oral anticoagulation for a three-month course. With the patient's very large abdominal aortic aneurysm, chart indicates discussion between Dr. Radford Pax and Dr. Stanford Breed with recommendation for Pradaxa since there is a reversal agent if needed and with previous erratic INR levels on Coumadin.  2. Large abdominal  aortic aneurysm measuring 7.1 cm by noncontrast CT in November 2017. Patient is asymptomatic at this time. Case has been discussed with Dr. Sammuel Hines at [facility] with plan for outpatient follow-up at this point.  3. Chronic diastolic heart failure, clinically stable at this point.  4. CAD with severe disease in the LAD distribution associated with right-to-left collaterals. This has been managed medically. No progressive angina symptoms.  5. COPD exacerbation, followed by internal medicine.  6. Paroxysmal atrial fibrillation, currently maintaining sinus rhythm.  Currently on heparin, will  be able to transition to Pradaxa tomorrow at 2200 per pharmacy note (needs 5 day treatment with Heparin before start). Obviously patient is very medically complex and remains at risk for adverse outcomes. Additional regimen includes Lopressor, Imdur, hydralazine, Lasix, bisoprolol, and Lipitor. Stop aspirin when he gets on Pradaxa. Will need follow-up with Dr. Stanford Breed after discharge.  Signed, Rozann Lesches, MD  12/10/2016, 11:20 AM

## 2016-12-11 ENCOUNTER — Other Ambulatory Visit: Payer: Self-pay | Admitting: *Deleted

## 2016-12-11 DIAGNOSIS — R739 Hyperglycemia, unspecified: Secondary | ICD-10-CM

## 2016-12-11 LAB — BASIC METABOLIC PANEL
Anion gap: 11 (ref 5–15)
BUN: 39 mg/dL — AB (ref 6–20)
CO2: 27 mmol/L (ref 22–32)
Calcium: 8.8 mg/dL — ABNORMAL LOW (ref 8.9–10.3)
Chloride: 99 mmol/L — ABNORMAL LOW (ref 101–111)
Creatinine, Ser: 1.17 mg/dL (ref 0.61–1.24)
GFR calc Af Amer: >60 mL/min (ref >60)
GFR calc non Af Amer: >60 mL/min (ref >60)
GLUCOSE: 185 mg/dL — AB (ref 65–99)
POTASSIUM: 4.3 mmol/L (ref 3.5–5.1)
SODIUM: 137 mmol/L (ref 135–145)

## 2016-12-11 LAB — CBC
HEMATOCRIT: 35.3 % — AB (ref 39.0–52.0)
HEMOGLOBIN: 11.3 g/dL — AB (ref 13.0–17.0)
MCH: 30.8 pg (ref 26.0–34.0)
MCHC: 32 g/dL (ref 30.0–36.0)
MCV: 96.2 fL (ref 78.0–100.0)
Platelets: 144 10*3/uL — ABNORMAL LOW (ref 150–400)
RBC: 3.67 MIL/uL — ABNORMAL LOW (ref 4.22–5.81)
RDW: 13.9 % (ref 11.5–15.5)
WBC: 10.6 10*3/uL — ABNORMAL HIGH (ref 4.0–10.5)

## 2016-12-11 LAB — GLUCOSE, CAPILLARY
GLUCOSE-CAPILLARY: 181 mg/dL — AB (ref 65–99)
GLUCOSE-CAPILLARY: 185 mg/dL — AB (ref 65–99)
GLUCOSE-CAPILLARY: 184 mg/dL — AB (ref 65–99)
Glucose-Capillary: 184 mg/dL — ABNORMAL HIGH (ref 65–99)

## 2016-12-11 LAB — HEPARIN LEVEL (UNFRACTIONATED): HEPARIN UNFRACTIONATED: 0.47 [IU]/mL (ref 0.30–0.70)

## 2016-12-11 LAB — PROTIME-INR
INR: 0.96
Prothrombin Time: 12.7 seconds (ref 11.4–15.2)

## 2016-12-11 NOTE — Care Management Important Message (Signed)
Important Message  Patient Details  Name: [name] MRN: [id number] Date of Birth: 17-Feb-1951   Medicare Important Message Given:  Yes    [name] 12/11/2016, 12:14 PM

## 2016-12-11 NOTE — Progress Notes (Signed)
ANTICOAGULATION CONSULT NOTE - Follow-up Consult  Pharmacy Consult for heparin Indication: DVT  Allergies  Allergen Reactions  . Cephalexin Anaphylaxis    REACTION: anaphylactic shock  . Penicillins Hives    REACTION: rash Has patient had a PCN reaction causing immediate rash, facial/tongue/throat swelling, SOB or lightheadedness with hypotension:YES Has patient had a PCN reaction causing severe rash involving mucus membranes or skin necrosis: NO Has patient had a PCN reaction that required hospitalization NO Has patient had a PCN reaction occurring within the last 10 years: NO If all of the above answers are "NO", then may proceed with Cephalosporin use.  . Vancomycin     thrombocytopenia  . Beta Adrenergic Blockers Other (See Comments)    Can take bisoprolol, cannot take less selective BB due to severe copd    Patient Measurements: Height: 5\' 7"  (170.2 cm) Weight: 152 lb 4.8 oz (69.1 kg) IBW/kg (Calculated) : 66.1 Heparin Dosing Weight: 67 Kg  Vital Signs: Temp: 97.9 F (36.6 C) (03/12 0604) Temp Source: Oral (03/12 0604) BP: 133/58 (03/12 0604) Pulse Rate: 66 (03/12 0604)  Labs:  Recent Labs  12/09/16 0715 12/10/16 0529 12/10/16 0857 12/11/16 0014 12/11/16 0520  HGB 11.1* 10.6*  --   --  11.3*  HCT 35.0* 33.7*  --   --  35.3*  PLT 140* 144*  --   --  144*  LABPROT 12.8  --  13.6  --  12.7  INR 0.96  --  1.04  --  0.96  HEPARINUNFRC 0.43 0.42  --   --  0.47  CREATININE  --  1.02  --  1.17  --     Estimated Creatinine Clearance: 57.3 mL/min (by C-G formula based on SCr of 1.17 mg/dL).  Assessment: 66 yo male new with DVT in R peroneal vein on IV heparin. Pharmacy asked to transition to pradaxa. Per manufactures guidelines at least 5 days of parenteral therapy (lovenox, Heparin) is required before starting Pradaxa (Heparin started 12/06/16 at 8pm).  Heparin level remains therapeutic (0.47) on 1350 units/hr. CBC stable, No bleeding noted per chart. crcl ~ 55-60  ml/min. Plan for staying here tonight, and possible discharge tomorrow morning.   Goal of Therapy:  Heparin level 0.3-0.5 units/ml Monitor platelets by anticoagulation protocol: Yes   Plan:  Continue heparin 1350 units/hr D/c heparin at 2200 and start pradaxa 150 mg po BID Monitor s/sx of bleeding   Maryanna Shape, PharmD, BCPS  Clinical Pharmacist  Pager: [phone]   12/11/2016 9:57 AM

## 2016-12-11 NOTE — Progress Notes (Signed)
Progress Note    [name]  WUJ:[id number] DOB: 1950/10/27  DOA: 12/06/2016 PCP: [name], MD    Brief Narrative:   Reason for consult: COPD exacerbation  Medical records reviewed and are as summarized below:  [name] is an 66 y.o. male with a PMH of severe COPD on chronic prednisone therapy, tobacco abuse since the age of 59, PAD, PAF (not on blood thinners at baseline due to large AAA), chronic diastolic CHF (EF 95-62 percent/grade 1 diastolic dysfunction by echo done 12/17), HLD on statin therapy, CAD with severe LAD disease, ischemic cardiomyopathy, 7 cm AAA, and pulmonary nodules was admitted by the cardiology service on 12/06/16 for treatment of acute right lower extremity DVT.  Assessment/Plan:   Principal Problem:   COPD exacerbation ([place]) Chest x-ray personally reviewed. No infiltrates. Hyperinflation. Continue bronchodilators, Brovana, Pulmicort, Robitussin, and Solu-Medrol 40 mg every 6 hours. Wean as tolerated. Continue pulmonary toilet with incentive spirometry. Continue supplemental oxygen and flutter valve. No wean of steroids yet as his lung exam continues to show diffuse coarse expiratory wheezes.    Active Problems:   Steroid-induced hyperglycemia Managed with moderate scale SSI 3 times a day. CBGs 132-236.    Hyperlipidemia Continue Lipitor.    Essential hypertension Continue bisoprolol, Lasix, hydralazine and Imdur.    Coronary artery disease  On aspirin, beta blocker, and statin therapy.    AAA (abdominal aortic aneurysm) ([place]) Follow-up with vascular surgery as an outpatient.    Chronic diastolic heart failure ([facility]) Continue Lasix.    DVT (deep venous thrombosis) ([facility]) On heparin. Primary team plans to transition to Pradaxa tomorrow.    DOE (dyspnea on exertion) Multifactorial. Treat COPD exacerbation.    Thrombocytopenia Mild, monitor.  Family Communication/Anticipated D/C date and plan/Code Status   DVT prophylaxis:  Heparin ordered. Code Status: Full Code.  Family Communication: No family currently at the bedside. Disposition Plan: Likely 12/12/16.   Procedures:    None  Anti-Infectives:    None  Subjective:   Breathing slowly improving..  Objective:    Vitals:   12/10/16 1221 12/10/16 2022 12/10/16 2126 12/11/16 0604  BP: 130/69 (!) 140/58  (!) 133/58  Pulse: 66 64  66  Resp: 18 19  20   Temp: 97.6 F (36.4 C) 98.2 F (36.8 C)  97.9 F (36.6 C)  TempSrc: Oral Oral  Oral  SpO2: 94% 96% 95% 98%  Weight:    69.1 kg (152 lb 4.8 oz)  Height:        Intake/Output Summary (Last 24 hours) at 12/11/16 0731 Last data filed at 12/11/16 1308  Gross per 24 hour  Intake             1970 ml  Output             1501 ml  Net              469 ml   Filed Weights   12/09/16 0511 12/10/16 0604 12/11/16 0604  Weight: 68.5 kg (151 lb 1.6 oz) 69.8 kg (153 lb 12.8 oz) 69.1 kg (152 lb 4.8 oz)    Exam: General exam: Appears calm and comfortable.  Respiratory system: Coarse inspiratory and expiratory wheezes. Respiratory effort mildly labored. Cardiovascular system: S1 & S2 heard, RRR. No JVD,  rubs, gallops or clicks. No murmurs. Gastrointestinal system: Abdomen is nondistended, soft and nontender. No organomegaly or masses felt. Normal bowel sounds heard. Central nervous system: Alert and oriented. No focal neurological deficits. Extremities:  Right lower extremity swollen. Skin: No rashes, lesions or ulcers. Psychiatry: Judgement and insight appear normal. Mood & affect appropriate.   Data Reviewed:   I have personally reviewed following labs and imaging studies:  Labs: Basic Metabolic Panel:  Recent Labs Lab 12/06/16 1909 12/07/16 0034 12/10/16 0529 12/11/16 0014  NA 144 143 137 137  K 4.6 3.7 4.3 4.3  CL 100* 97* 100* 99*  CO2 32 34* 28 27  GLUCOSE 126* 111* 153* 185*  BUN 18 19 31* 39*  CREATININE 1.32* 1.19 1.02 1.17  CALCIUM 9.5 8.9 8.8* 8.8*   GFR Estimated  Creatinine Clearance: 57.3 mL/min (by C-G formula based on SCr of 1.17 mg/dL). Liver Function Tests:  Recent Labs Lab 12/06/16 1909  AST 22  ALT 17  ALKPHOS 99  BILITOT 0.7  PROT 5.9*  ALBUMIN 3.4*   Coagulation profile  Recent Labs Lab 12/08/16 0429 12/09/16 0715 12/10/16 0857 12/11/16 0520  INR 0.98 0.96 1.04 0.96    CBC:  Recent Labs Lab 12/06/16 1909 12/07/16 0034 12/08/16 0429 12/09/16 0715 12/10/16 0529 12/11/16 0520  WBC 8.6 7.6 8.8 5.3 9.6 10.6*  NEUTROABS 7.4  --   --   --   --   --   HGB 11.6* 11.0* 10.9* 11.1* 10.6* 11.3*  HCT 36.3* 35.2* 34.4* 35.0* 33.7* 35.3*  MCV 98.1 98.1 98.0 97.0 96.3 96.2  PLT 162 145* 141* 140* 144* 144*   CBG:  Recent Labs Lab 12/09/16 2046 12/10/16 0721 12/10/16 1147 12/10/16 1545 12/10/16 2131  GLUCAP 174* 157* 166* 236* 132*    Microbiology No results found for this or any previous visit (from the past 240 hour(s)).  Radiology: No results found.  Medications:   . arformoterol  15 mcg Nebulization BID  . aspirin EC  81 mg Oral Daily  . atorvastatin  80 mg Oral q1800  . bisoprolol  5 mg Oral Daily  . budesonide  0.5 mg Nebulization BID  . dabigatran  150 mg Oral Q12H  . ferrous sulfate  325 mg Oral Q breakfast  . furosemide  40 mg Oral Daily  . hydrALAZINE  10 mg Oral TID  . insulin aspart  0-15 Units Subcutaneous TID WC  . ipratropium-albuterol  3 mL Nebulization QID  . isosorbide mononitrate  15 mg Oral Daily  . methylPREDNISolone (SOLU-MEDROL) injection  40 mg Intravenous Q6H  . metoprolol  5 mg Intravenous Once  . morphine  15 mg Oral Q6H  . roflumilast  500 mcg Oral Daily  . sodium chloride flush  3 mL Intravenous Q12H   Continuous Infusions: . heparin 1,350 Units/hr (12/10/16 2339)    Medical decision making is of high complexity and this patient is at high risk of deterioration, therefore this is a level 3 visit.  (> 4 problem points, 2 data points, high risk: Exacerbation of chronic  illness)   LOS: 5 days   Hernan Turnage  [facility] Pager [phone]. If unable to reach me by pager, please call my cell phone at [phone].  *Please refer to [email or web address], password [email or web address] to get updated schedule on who will round on this patient, as hospitalists switch teams weekly. If 7PM-7AM, please contact night-coverage at [email or web address], password [email or web address] for any overnight needs.  12/11/2016, 7:31 AM

## 2016-12-11 NOTE — Progress Notes (Signed)
Progress Note  Patient Name: [name] Date of Encounter: 12/11/2016  Primary Cardiologist: Dr. Kirk Ruths  Subjective   Leg swelling has improved. Plan to start Pradaxa tonight.  Inpatient Medications    Scheduled Meds: . arformoterol  15 mcg Nebulization BID  . aspirin EC  81 mg Oral Daily  . atorvastatin  80 mg Oral q1800  . bisoprolol  5 mg Oral Daily  . budesonide  0.5 mg Nebulization BID  . dabigatran  150 mg Oral Q12H  . ferrous sulfate  325 mg Oral Q breakfast  . furosemide  40 mg Oral Daily  . hydrALAZINE  10 mg Oral TID  . insulin aspart  0-15 Units Subcutaneous TID WC  . ipratropium-albuterol  3 mL Nebulization QID  . isosorbide mononitrate  15 mg Oral Daily  . methylPREDNISolone (SOLU-MEDROL) injection  40 mg Intravenous Q6H  . metoprolol  5 mg Intravenous Once  . morphine  15 mg Oral Q6H  . roflumilast  500 mcg Oral Daily  . sodium chloride flush  3 mL Intravenous Q12H   Continuous Infusions: . heparin 1,350 Units/hr (12/10/16 2339)   PRN Meds: sodium chloride, acetaminophen, albuterol, ALPRAZolam, guaiFENesin, HYDROcodone-acetaminophen, nitroGLYCERIN, ondansetron (ZOFRAN) IV, sodium chloride flush, traZODone   Vital Signs    Vitals:   12/10/16 2022 12/10/16 2126 12/11/16 0604 12/11/16 0846  BP: (!) 140/58  (!) 133/58   Pulse: 64  66   Resp: 19  20   Temp: 98.2 F (36.8 C)  97.9 F (36.6 C)   TempSrc: Oral  Oral   SpO2: 96% 95% 98% 94%  Weight:   152 lb 4.8 oz (69.1 kg)   Height:        Intake/Output Summary (Last 24 hours) at 12/11/16 0918 Last data filed at 12/11/16 0900  Gross per 24 hour  Intake             1748 ml  Output             1701 ml  Net               47 ml   Filed Weights   12/09/16 0511 12/10/16 0604 12/11/16 0604  Weight: 151 lb 1.6 oz (68.5 kg) 153 lb 12.8 oz (69.8 kg) 152 lb 4.8 oz (69.1 kg)    Telemetry    Currently normal sinus rhythm. Personally reviewed.  Physical Exam   GEN: No acute distress.     Neck: No JVD. Cardiac: RRR, no gallop.  Respiratory: Decreased breath sounds with mild end expiratory wheezing. GI: Soft, nontender, bowel sounds present. MS:  Improving leg edema; No deformity.  Labs    Chemistry  Recent Labs Lab 12/06/16 1909 12/07/16 0034 12/10/16 0529 12/11/16 0014  NA 144 143 137 137  K 4.6 3.7 4.3 4.3  CL 100* 97* 100* 99*  CO2 32 34* 28 27  GLUCOSE 126* 111* 153* 185*  BUN 18 19 31* 39*  CREATININE 1.32* 1.19 1.02 1.17  CALCIUM 9.5 8.9 8.8* 8.8*  PROT 5.9*  --   --   --   ALBUMIN 3.4*  --   --   --   AST 22  --   --   --   ALT 17  --   --   --   ALKPHOS 99  --   --   --   BILITOT 0.7  --   --   --   GFRNONAA 54* >60 >60 >60  GFRAA >60 >  60 >60 >60  ANIONGAP 12 12 9 11      Hematology  Recent Labs Lab 12/09/16 0715 12/10/16 0529 12/11/16 0520  WBC 5.3 9.6 10.6*  RBC 3.61* 3.50* 3.67*  HGB 11.1* 10.6* 11.3*  HCT 35.0* 33.7* 35.3*  MCV 97.0 96.3 96.2  MCH 30.7 30.3 30.8  MCHC 31.7 31.5 32.0  RDW 14.0 14.0 13.9  PLT 140* 144* 144*    Radiology    No results found.  Cardiac Studies   Echocardiogram 09/14/2016: Study Conclusions  - Left ventricle: The cavity size was normal. There was severe   focal basal and moderate concentric hypertrophy of the left   ventricle. Systolic function was normal. The estimated ejection   fraction was in the range of 55% to 60%. There is akinesis of the   apical inferior segment. Doppler parameters are consistent with   abnormal left ventricular relaxation (grade 1 diastolic   dysfunction). Doppler parameters are consistent with elevated   ventricular end-diastolic filling pressure. - Aortic valve: Trileaflet; mildly thickened, mildly calcified   leaflets. There was mild regurgitation. - Aortic root: The aortic root was mildly dilated measuring 41 mm. - Ascending aorta: The ascending aorta was not visualized. - Mitral valve: Structurally normal valve. There was no   regurgitation. - Right  ventricle: The cavity size was normal. Wall thickness was   normal. Systolic function was normal. - Right atrium: The atrium was normal in size. - Tricuspid valve: There was no regurgitation. - Pulmonary arteries: Systolic pressure could not be accurately   estimated. - Inferior vena cava: The vessel was normal in size. - Pericardium, extracardiac: There was no pericardial effusion.  Patient Profile     66 y.o. male with a history of CAD, COPD, hypertension, hyperlipidemia, PAD, large abdominal aortic aneurysm measuring 7.1 cm, chronic diastolic heart failure, paroxysmal atrial fibrillation, now admitted with leg edema and diagnosis of right lower extremity DVT. Given his medical complexity has been assessed by vascular surgery as well as the cardiology service, recommendations outlined in the chart. He is now on heparin with plan to transition to Coumadin and also followed by the internal medicine service.  Assessment & Plan    1. Right leg DVT. Case has been discussed with vascular surgery in light of medical complexity. Plan is heparin with transition to oral anticoagulation for a three-month course. With the patient's very large abdominal aortic aneurysm, chart indicates discussion between Dr. Radford Pax and Dr. Stanford Breed with recommendation for Pradaxa since there is a reversal agent if needed and with previous erratic INR levels on Coumadin. Fit RLE compression stocking to reduce the risk of post-thrombotic syndrome.  2. Large abdominal aortic aneurysm measuring 7.1 cm by noncontrast CT in November 2017. Patient is asymptomatic at this time. Case has been discussed with Dr. Sammuel Hines at [facility] with plan for outpatient follow-up at this point.  3. Chronic diastolic heart failure, clinically stable at this point.  4. CAD with severe disease in the LAD distribution associated with right-to-left collaterals. This has been managed medically. No progressive angina symptoms.  5. COPD exacerbation,  followed by internal medicine.  6. Paroxysmal atrial fibrillation, currently maintaining sinus rhythm.  Transition to Pradaxa tonight. Anticipate d/c tomorrow after second dose.  Pixie Casino, MD, [facility] Attending Cardiologist [place], [place]  12/11/2016, 9:18 AM

## 2016-12-11 NOTE — Patient Outreach (Addendum)
[facility]) Care Management  12/11/2016  [name] Jul 12, 1951 [id number]   RN attempted an outreach call however pt remains inpt with plans for a discharged on tomorrow. Will continue to follow up and proceed with the transition of care calls accordingly.  [name], RN Care Management Coordinator [place] Office [phone]

## 2016-12-12 ENCOUNTER — Encounter (HOSPITAL_COMMUNITY): Payer: Self-pay | Admitting: Physician Assistant

## 2016-12-12 ENCOUNTER — Telehealth: Payer: Self-pay | Admitting: Cardiology

## 2016-12-12 ENCOUNTER — Telehealth: Payer: Self-pay | Admitting: Family Medicine

## 2016-12-12 DIAGNOSIS — I472 Ventricular tachycardia: Secondary | ICD-10-CM

## 2016-12-12 DIAGNOSIS — G894 Chronic pain syndrome: Secondary | ICD-10-CM

## 2016-12-12 DIAGNOSIS — D649 Anemia, unspecified: Secondary | ICD-10-CM

## 2016-12-12 DIAGNOSIS — D696 Thrombocytopenia, unspecified: Secondary | ICD-10-CM

## 2016-12-12 DIAGNOSIS — M5416 Radiculopathy, lumbar region: Secondary | ICD-10-CM

## 2016-12-12 LAB — GLUCOSE, CAPILLARY
GLUCOSE-CAPILLARY: 179 mg/dL — AB (ref 65–99)
GLUCOSE-CAPILLARY: 184 mg/dL — AB (ref 65–99)

## 2016-12-12 LAB — CBC
HCT: 32.7 % — ABNORMAL LOW (ref 39.0–52.0)
Hemoglobin: 10.4 g/dL — ABNORMAL LOW (ref 13.0–17.0)
MCH: 30.5 pg (ref 26.0–34.0)
MCHC: 31.8 g/dL (ref 30.0–36.0)
MCV: 95.9 fL (ref 78.0–100.0)
PLATELETS: 134 10*3/uL — AB (ref 150–400)
RBC: 3.41 MIL/uL — ABNORMAL LOW (ref 4.22–5.81)
RDW: 14.1 % (ref 11.5–15.5)
WBC: 8.8 10*3/uL (ref 4.0–10.5)

## 2016-12-12 MED ORDER — PREDNISONE 10 MG PO TABS: 10.0000 mg | ORAL_TABLET | Freq: Every day | ORAL | 1 refills | Status: DC

## 2016-12-12 MED ORDER — ISOSORBIDE MONONITRATE ER 30 MG PO TB24: 15.0000 mg | ORAL_TABLET | Freq: Every day | ORAL | 2 refills | Status: DC

## 2016-12-12 MED ORDER — PREDNISONE 10 MG PO TABS: 10.0000 mg | ORAL_TABLET | ORAL | 1 refills | Status: DC

## 2016-12-12 MED ORDER — INSULIN GLARGINE 100 UNIT/ML ~~LOC~~ SOLN
10.0000 [IU] | SUBCUTANEOUS | Status: DC
  Administered 2016-12-12: 10 [IU] via SUBCUTANEOUS
  Filled 2016-12-12: qty 0.1

## 2016-12-12 MED ORDER — DABIGATRAN ETEXILATE MESYLATE 150 MG PO CAPS: 150.0000 mg | ORAL_CAPSULE | Freq: Two times a day (BID) | ORAL | 2 refills | Status: DC

## 2016-12-12 MED ORDER — FUROSEMIDE 40 MG PO TABS: 40.0000 mg | ORAL_TABLET | Freq: Every day | ORAL | 2 refills | Status: AC

## 2016-12-12 NOTE — Telephone Encounter (Signed)
Pt need new Rx for morphine  Pt is aware of 3 business days for refills.

## 2016-12-12 NOTE — Progress Notes (Signed)
Progress Note    [name]  ERD:[id number] DOB: May 09, 1951  DOA: 12/06/2016 PCP: [name], MD    Brief Narrative:   Reason for consult: COPD exacerbation  Medical records reviewed and are as summarized below:  [name] is an 67 y.o. male with a PMH of severe COPD on chronic prednisone therapy, tobacco abuse since the age of 39, PAD, PAF (not on blood thinners at baseline due to large AAA), chronic diastolic CHF (EF 56-31 percent/grade 1 diastolic dysfunction by echo done 12/17), HLD on statin therapy, CAD with severe LAD disease, ischemic cardiomyopathy, 7 cm AAA, and pulmonary nodules was admitted by the cardiology service on 12/06/16 for treatment of acute right lower extremity DVT.  Assessment/Plan:   Principal Problem:   COPD exacerbation ([place]) Chest x-ray personally reviewed. No infiltrates. Hyperinflation. Responded well to bronchodilators, Brovana, Pulmicort, Robitussin, and Solu-Medrol 40 mg every 6 hours. Continue pulmonary toilet with incentive spirometry. Continue supplemental oxygen and flutter valve. OK to d/c home on prednisone taper, orders placed for d/c.    Active Problems:   Steroid-induced hyperglycemia Should improve with steroid taper. Needs close F/U with PCP to ensure sugars return to normal.    Hyperlipidemia Continue Lipitor.    Essential hypertension Continue bisoprolol, Lasix, hydralazine and Imdur.    Coronary artery disease  On aspirin, beta blocker, and statin therapy.    AAA (abdominal aortic aneurysm) ([place]) Follow-up with vascular surgery as an outpatient.    Chronic diastolic heart failure ([facility]) Continue Lasix.    DVT (deep venous thrombosis) ([facility]) On heparin. Primary team plans to transition to Pradaxa tomorrow.    DOE (dyspnea on exertion) Multifactorial. Treat COPD exacerbation.    Thrombocytopenia Mild, monitor.  Family Communication/Anticipated D/C date and plan/Code Status   DVT prophylaxis: Heparin  ordered. Code Status: Full Code.  Family Communication: No family currently at the bedside. Disposition Plan: Likely 12/12/16.   Procedures:    None  Anti-Infectives:    None  Subjective:   Breathing much better.  Feels ready to go home.  Objective:    Vitals:   12/11/16 1620 12/11/16 2058 12/11/16 2117 12/12/16 0613  BP:   133/61 140/65  Pulse:  60 (!) 58 64  Resp:  18 18 18   Temp:   97.8 F (36.6 C) 97.9 F (36.6 C)  TempSrc:   Oral Oral  SpO2: 93% 97% 97% 100%  Weight:    69 kg (152 lb 1.6 oz)  Height:        Intake/Output Summary (Last 24 hours) at 12/12/16 0901 Last data filed at 12/12/16 0610  Gross per 24 hour  Intake              808 ml  Output              975 ml  Net             -167 ml   Filed Weights   12/10/16 0604 12/11/16 0604 12/12/16 0613  Weight: 69.8 kg (153 lb 12.8 oz) 69.1 kg (152 lb 4.8 oz) 69 kg (152 lb 1.6 oz)    Exam: General exam: Appears calm and comfortable.  Respiratory system: Still wheezing, but much better. Respiratory effort mildly increased but not labored. Cardiovascular system: S1 & S2 heard, RRR. No JVD,  rubs, gallops or clicks. No murmurs. Gastrointestinal system: Abdomen is nondistended, soft and nontender. No organomegaly or masses felt. Normal bowel sounds heard. Central nervous system: Alert and oriented.  No focal neurological deficits. Extremities: Right lower extremity swollen. Skin: No rashes, lesions or ulcers. Psychiatry: Judgement and insight appear normal. Mood & affect appropriate.   Data Reviewed:   I have personally reviewed following labs and imaging studies:  Labs: Basic Metabolic Panel:  Recent Labs Lab 12/06/16 1909 12/07/16 0034 12/10/16 0529 12/11/16 0014  NA 144 143 137 137  K 4.6 3.7 4.3 4.3  CL 100* 97* 100* 99*  CO2 32 34* 28 27  GLUCOSE 126* 111* 153* 185*  BUN 18 19 31* 39*  CREATININE 1.32* 1.19 1.02 1.17  CALCIUM 9.5 8.9 8.8* 8.8*   GFR Estimated Creatinine Clearance:  57.3 mL/min (by C-G formula based on SCr of 1.17 mg/dL). Liver Function Tests:  Recent Labs Lab 12/06/16 1909  AST 22  ALT 17  ALKPHOS 99  BILITOT 0.7  PROT 5.9*  ALBUMIN 3.4*   Coagulation profile  Recent Labs Lab 12/08/16 0429 12/09/16 0715 12/10/16 0857 12/11/16 0520  INR 0.98 0.96 1.04 0.96    CBC:  Recent Labs Lab 12/06/16 1909  12/08/16 0429 12/09/16 0715 12/10/16 0529 12/11/16 0520 12/12/16 0353  WBC 8.6  < > 8.8 5.3 9.6 10.6* 8.8  NEUTROABS 7.4  --   --   --   --   --   --   HGB 11.6*  < > 10.9* 11.1* 10.6* 11.3* 10.4*  HCT 36.3*  < > 34.4* 35.0* 33.7* 35.3* 32.7*  MCV 98.1  < > 98.0 97.0 96.3 96.2 95.9  PLT 162  < > 141* 140* 144* 144* 134*  < > = values in this interval not displayed. CBG:  Recent Labs Lab 12/11/16 0749 12/11/16 1131 12/11/16 1640 12/11/16 2120 12/12/16 0759  GLUCAP 181* 185* 184* 184* 179*    Microbiology No results found for this or any previous visit (from the past 240 hour(s)).  Radiology: No results found.  Medications:   . arformoterol  15 mcg Nebulization BID  . aspirin EC  81 mg Oral Daily  . atorvastatin  80 mg Oral q1800  . bisoprolol  5 mg Oral Daily  . budesonide  0.5 mg Nebulization BID  . dabigatran  150 mg Oral Q12H  . ferrous sulfate  325 mg Oral Q breakfast  . furosemide  40 mg Oral Daily  . hydrALAZINE  10 mg Oral TID  . insulin aspart  0-15 Units Subcutaneous TID WC  . ipratropium-albuterol  3 mL Nebulization QID  . isosorbide mononitrate  15 mg Oral Daily  . methylPREDNISolone (SOLU-MEDROL) injection  40 mg Intravenous Q6H  . metoprolol  5 mg Intravenous Once  . morphine  15 mg Oral Q6H  . roflumilast  500 mcg Oral Daily  . sodium chloride flush  3 mL Intravenous Q12H   Continuous Infusions:   Medical decision making is of high complexity and this patient is at high risk of deterioration, therefore this is a level 3 visit.  (> 4 problem points, 0 data points, mod risk)   LOS: 6 days    Karenann Mcgrory  [facility] Pager [phone]. If unable to reach me by pager, please call my cell phone at [phone].  *Please refer to [email or web address], password [email or web address] to get updated schedule on who will round on this patient, as hospitalists switch teams weekly. If 7PM-7AM, please contact night-coverage at [email or web address], password [email or web address] for any overnight needs.  12/12/2016, 9:01 AM

## 2016-12-12 NOTE — Discharge Summary (Signed)
Discharge Summary    Patient ID: JYMIR DUNAJ,  MRN: [id number], DOB/AGE: 68-May-1952 68 y.o.  Admit date: 12/06/2016 Discharge date: 12/12/2016  Primary Care Provider: [name] Primary Cardiologist: Dr. Stanford Breed  Discharge Diagnoses    Principal Problem:   DVT (deep venous thrombosis) [facility]) Active Problems:   Hyperlipidemia   Essential hypertension   Thrombocytopenia (HCC)   Coronary artery disease   Normocytic anemia   COPD exacerbation (HCC)   Steroid-induced hyperglycemia   AAA (abdominal aortic aneurysm) (HCC)   NSVT (nonsustained ventricular tachycardia) (HCC)   PAF (paroxysmal atrial fibrillation) (HCC)   Chronic diastolic heart failure (HCC)   DOE (dyspnea on exertion)    Diagnostic Studies/Procedures    1. LE duplex 12/06/16 - Technically challenging due to increase lower leg swelling. Evidence of right lower extremity acute, occlusive, deep venous thrombus of uncertain age in one of the paired peroneal veins. No evidence of right lower extremity superficial venous thrombus or incompetence. Murray Hodgkins spoke directly with patient. _____________     History of Present Illness     [name] is a 66 y.o. male with history of severeCAD (tx medically 2012), large AAA (7.1cm on imaging 08/2016), renal artery stenosis, carotid stenosis, left subclavian stenosis, ischemic cardiomyopathy, chronic combined CHF with normalized EF, severe COPD, prior ARF (requiring DC of ARB), former EtOH, left upper lobe spiculated mass followed by pulmonology, and PAF (not previously on [place] due to AAA, CHADSVASC 5), NSVT who presented to the office with persistent leg edema. He was hospitalized in December and early January was COPD exacerbation. Since then, he has continued to have dyspnea on exertion and has been fairly weak and sedentary though he is slowly recovering at this point. He was seen in clinic in late January with lower extremity swelling and his Lasix dose was  increased to 40 mg daily for a few days and then dropped back down to 20 mg daily. Over the past 2 weeks, he had some improvement in lower extremity swelling with resolution of left lower extremity edema but persistent right lower extremity edema. Lower extremity u/s performed this afternoon and revealed an acute R peroneal vein DVT. He was admitted to [facility] for further management cautiously especially in light of his history of AAA. During admission, the following issues were addressed:  Hospital Course    During admission, the following issues were addressed:  1. Right leg DVT - Case has been discussed with vascular surgery who also spoke with Dr. Sammuel Hines at [facility] in light of medical complexity. He was treated with heparin with transition to oral anticoagulation for a three-month course. With the patient's very large abdominal aortic aneurysm, chart indicated discussion between Dr. Radford Pax and Dr. Stanford Breed with recommendation for Pradaxa since there is a reversal agent if needed and with previous erratic INR levels on Coumadin. He received RLE compression stocking to reduce the risk of post-thrombotic syndrome.   2. Large abdominal aortic aneurysm measuring 7.1 cm by noncontrast CT in November 2017 - Patient is asymptomatic at this time. Case has been discussed with Dr. Sammuel Hines at [facility] with plan for outpatient follow-up at this point - this is per discussion between Dr. Radford Pax and Dr. Sammuel Hines, as well as vascular surgery input here at [facility]. The patient had missed his last appointments due to not feeling well. The importance of follow-up for this was reinforced. I have sent a message to Dr. Jacalyn Lefevre nurse to help facilitate rescheduling close follow-up with Dr. Sammuel Hines,  and our office will call the patient with this information.  3. Chronic diastolic heart failure - clinically stable at this point. He was continued on Lasix 40mg  daily during his stay; continue this dose per Dr. Debara Pickett.  4. CAD  with severe disease in the LAD distribution associated with right-to-left collaterals - This has been managed medically. He had not had any recent anginal symptoms. Aspirin was stopped due to risk of bleeding while on concomitant Pradaxa, and no recent angina.  5. COPD exacerbation - followed by internal medicine, treated with bronchodilators, Brovana, Pulmicort, Robitussin, and Solu-Medrol 40 mg. This was transitioned to a prednisone taper - original taper was a quick one but patient raised concern about this as he typically requires a prolonged taper. Per d/w IM, OK to prolong (60mg  x 3 days, then 50mg  x 3 etc until continuing maintenance dose of 10mg  daily).   6. Paroxysmal atrial fibrillation - brief recurrence noted this admission, but largely maintained NSR throughout his stay.  7. Anemia/thrombocytopenia - will need to be followed closely on anticoag; previously 12-13 range in the last several months. Recommend OP CBC at follow-up appointment.   Dr. Debara Pickett has seen and examined the patient today and feels he is stable for discharge. Have arranged close OP f/u in the office with care team APP.   Consultants: IM, Vascular surgery  _____________  Discharge Vitals Blood pressure 138/61, pulse (!) 56, temperature 97.8 F (36.6 C), temperature source Oral, resp. rate 20, height 5\' 7"  (1.702 m), weight 152 lb 1.6 oz (69 kg), SpO2 96 %.  Filed Weights   12/10/16 0604 12/11/16 0604 12/12/16 0613  Weight: 153 lb 12.8 oz (69.8 kg) 152 lb 4.8 oz (69.1 kg) 152 lb 1.6 oz (69 kg)    Labs & Radiologic Studies    CBC  Recent Labs  12/11/16 0520 12/12/16 0353  WBC 10.6* 8.8  HGB 11.3* 10.4*  HCT 35.3* 32.7*  MCV 96.2 95.9  PLT 144* 784*   Basic Metabolic Panel  Recent Labs  12/10/16 0529 12/11/16 0014  NA 137 137  K 4.3 4.3  CL 100* 99*  CO2 28 27  GLUCOSE 153* 185*  BUN 31* 39*  CREATININE 1.02 1.17  CALCIUM 8.8* 8.8*   _____________  Dg Chest Port 1 View  Result Date:  12/08/2016 CLINICAL DATA:  Shortness of breath EXAM: PORTABLE CHEST 1 VIEW COMPARISON:  09/24/2016 FINDINGS: 2039 hours. Lungs are hyperexpanded. The lungs are clear wiithout focal pneumonia, edema, pneumothorax or pleural effusion. Interstitial markings are diffusely coarsened with chronic features. The cardio pericardial silhouette is enlarged. The visualized bony structures of the thorax are intact. Telemetry leads overlie the chest. IMPRESSION: Stable.  Hyperinflation without acute cardiopulmonary findings. Electronically Signed   By: Misty Stanley M.D.   On: 12/08/2016 21:04   Disposition   Pt is being discharged home today in good condition.  Follow-up Plans & Appointments    Follow-up [facility], [place] Follow up.   Specialties:  Physician Assistant, Cardiology Why:  Follow-up as listed below on 12/25/16. [place] is one of the PAs that works closely with Dr. Jacalyn Lefevre care team. Contact information: [address] [place] [address] [place] [address] [phone]        Thomasene Ripple, MD Follow up.   Specialty:  Vascular Surgery Why:  We have sent Dr. Jacalyn Lefevre nurse a nurse to help schedule your follow-up with vascular. Please call the office if you have not heard back within  2 days.  Contact information: [place] [address] [place] [address] [phone]        [name], MD Follow up.   Specialty:  Family Medicine Why:  To recheck your COPD Contact information: [place] [address] [phone]          Discharge Instructions    AMB Referral to [facility]    Complete by:  As directed    Patient is active with pharmacist, will have co-ags at discharge for DVT.  [employer]  Please assign to community nurse for transition of care calls and assess for home visits. Questions please call:   Natividad Brood, RN BSN [facility] Liaison  [phone] business  mobile phone Toll free office [phone]   Reason for consult:  Post hospital follow up, High risk   Expected date of contact:  1-3 days (reserved for hospital discharges)   Diet - low sodium heart healthy    Complete by:  As directed    Increase activity slowly    Complete by:  As directed    STOP taking aspirin.  You were started on Pradaxa (dabigatran). If you notice any bleeding such as blood in stool, black tarry stools, blood in urine, nosebleeds or any other unusual bleeding, call your doctor immediately.  Your long-acting nitrate was changed from isosorbide DInitrate to isosorbide MONOnitrate. (The form you had listed previously is typically a 3-times-a-day medication; this was changed to a version you only take once a day.)  Your fluid medication (Lasix/furosemide) will be changed to 40mg  daily. New prescription sent in.  Please adjust your prednisone to a taper:  Tomorrow, start at 60mg  (6 tablets) per day for 3 days, Then decrease to 50mg  (5 tablets) per day for 3 days, Then decrease to 40mg  (4 tablets) per day for 3 days, Then decrease to 30mg  (3 tablets) per day for 3 days, Then decrease to 20mg  (2 tablets) per day for 3 days, Then continue a 10mg  (1 tablet) daily maintenance dose.      Discharge Medications   Allergies as of 12/12/2016      Reactions   Cephalexin Anaphylaxis   REACTION: anaphylactic shock   Penicillins Hives   REACTION: rash Has patient had a PCN reaction causing immediate rash, facial/tongue/throat swelling, SOB or lightheadedness with hypotension:YES Has patient had a PCN reaction causing severe rash involving mucus membranes or skin necrosis: NO Has patient had a PCN reaction that required hospitalization NO Has patient had a PCN reaction occurring within the last 10 years: NO If all of the above answers are "NO", then may proceed with Cephalosporin use.   Vancomycin    thrombocytopenia   Beta Adrenergic Blockers Other (See Comments)   Can  take bisoprolol, cannot take less selective BB due to severe copd       Medication List    STOP taking these medications   aspirin 325 MG tablet   isosorbide dinitrate 30 MG tablet Commonly known as:  ISORDIL     TAKE these medications   accu-chek soft touch lancets Test once daily Dx E11.9   albuterol 108 (90 Base) MCG/ACT inhaler Commonly known as:  PROVENTIL HFA;VENTOLIN HFA Inhale 2 puffs into the lungs every 6 (six) hours as needed for wheezing or shortness of breath.   albuterol (5 MG/ML) 0.5% nebulizer solution Commonly known as:  PROVENTIL Take 0.5 mLs (2.5 mg total) by nebulization every 4 (four) hours as needed for  wheezing or shortness of breath.   ALPRAZolam 0.25 MG tablet Commonly known as:  XANAX Take 1 tablet (0.25 mg total) by mouth 3 (three) times daily as needed. for anxiety   arformoterol 15 MCG/2ML Nebu Commonly known as:  BROVANA Take 2 mLs (15 mcg total) by nebulization 2 (two) times daily. DX: J44.9   atorvastatin 80 MG tablet Commonly known as:  LIPITOR Take 1 tablet (80 mg total) by mouth daily.   b complex vitamins tablet Take 1 tablet by mouth daily.   bisoprolol 5 MG tablet Commonly known as:  ZEBETA TAKE 1 TABLET EVERY DAY   budesonide 0.5 MG/2ML nebulizer solution Commonly known as:  PULMICORT Take 2 mLs (0.5 mg total) by nebulization 2 (two) times daily. J44.9   dabigatran 150 MG Caps capsule Commonly known as:  PRADAXA Take 1 capsule (150 mg total) by mouth every 12 (twelve) hours.   FLUTTER Devi Use as directed.   furosemide 40 MG tablet Commonly known as:  LASIX Take 1 tablet (40 mg total) by mouth daily. What changed:  medication strength  how much to take   glucose blood test strip Test once daily E11.9   hydrALAZINE 10 MG tablet Commonly known as:  APRESOLINE Take 10 mg by mouth 3 (three) times daily.   ipratropium 0.02 % nebulizer solution Commonly known as:  ATROVENT INHALE ONE VIAL IN NEBULIZER 4 TIMES  DAILY   Iron 325 (65 Fe) MG Tabs Take 1 tablet by mouth daily.   isosorbide mononitrate 30 MG 24 hr tablet Commonly known as:  IMDUR Take 0.5 tablets (15 mg total) by mouth daily. Start taking on:  12/13/2016   Krill Oil 1000 MG Caps Take 1 capsule by mouth daily.   morphine 15 MG tablet Commonly known as:  MSIR Take 1 tablet (15 mg total) by mouth 4 (four) times daily.   multivitamin per tablet Take 1 tablet by mouth daily.   predniSONE 10 MG tablet Commonly known as:  DELTASONE Take 1 tablet (10 mg total) by mouth as directed. What changed:  when to take this Notes to patient:  Please adjust your prednisone to a taper:  Tomorrow, start at 60mg  (6 tablets) per day for 3 days, Then decrease to 50mg  (5 tablets) per day for 3 days, Then decrease to 40mg  (4 tablets) per day for 3 days, Then decrease to 30mg  (3 tablets) per day for 3 days, Then decrease to 20mg  (2 tablets) per day for 3 days, Then continue a 10mg  (1 tablet) daily maintenance dose.    roflumilast 500 MCG Tabs tablet Commonly known as:  DALIRESP Take 1 tablet (500 mcg total) by mouth daily.   traZODone 50 MG tablet Commonly known as:  DESYREL TAKE ONE-HALF TO ONE TABLET BY MOUTH AT BEDTIME AS NEEDED FOR SLEEP        Allergies:  Allergies  Allergen Reactions  . Cephalexin Anaphylaxis    REACTION: anaphylactic shock  . Penicillins Hives    REACTION: rash Has patient had a PCN reaction causing immediate rash, facial/tongue/throat swelling, SOB or lightheadedness with hypotension:YES Has patient had a PCN reaction causing severe rash involving mucus membranes or skin necrosis: NO Has patient had a PCN reaction that required hospitalization NO Has patient had a PCN reaction occurring within the last 10 years: NO If all of the above answers are "NO", then may proceed with Cephalosporin use.  . Vancomycin     thrombocytopenia  . Beta Adrenergic Blockers Other (See Comments)  Can take bisoprolol, cannot  take less selective BB due to severe copd      Outstanding Labs/Studies   N/a but see above regarding rec for CBC.  Duration of Discharge Encounter   Greater than 30 minutes including physician time.  Signed, Nedra Hai Dunn PA-C 12/12/2016, 1:13 PM

## 2016-12-12 NOTE — Discharge Instructions (Addendum)
Steroids may cause elevated blood sugars.  Avoid concentrated sweets and high starchy foods while on higher doses of steroids and follow up closely with your primary care doctor to check your blood sugar once you are off the higher doses of steroids.    Information on my medicine - Pradaxa (dabigatran)  This medication education was reviewed with me or my healthcare representative as part of my discharge preparation.  Why was Pradaxa prescribed for you? Pradaxa was prescribed to treat blood clots that may have been found in the veins of your legs (deep vein thrombosis) or in your lungs (pulmonary embolism) and to reduce the risk of them occurring again.  Pradaxa will take the place of the injectable anticoagulant medication you have been receiving for this condition for the last 5-10 days.  What do you Need to know about PradAXa? Take your Pradaxa TWICE DAILY - one capsule in the morning and one tablet in the evening with or without food.  It would be best to take the doses about the same time each day.  The capsules should not be broken, chewed or opened - they must be swallowed whole.  Do not store Pradaxa in other medication containers - once the bottle is opened the Pradaxa should be used within FOUR months; throw away any capsules that havent been by that time.  Take Pradaxa exactly as prescribed by your doctor.  DO NOT stop taking Pradaxa without talking to the doctor who prescribed the medication.  Refill your prescription before you run out.  After discharge, you should have regular check-up appointments with your healthcare provider that is prescribing your Pradaxa.  In the future your dose may need to be changed if your kidney function or weight changes by a significant amount.  What do you do if you miss a dose? If you miss a dose, take it as soon as you remember on the same day.  If your next dose is less than 6 hours away, skip the missed dose.  Do not take two doses of  PRADAXA at the same time.  Important Safety Information A possible side effect of Pradaxa is bleeding. You should call your healthcare provider right away if you experience any of the following: ? Bleeding from an injury or your nose that does not stop. ? Unusual colored urine (red or dark brown) or unusual colored stools (red or black). ? Unusual bruising for unknown reasons. ? A serious fall or if you hit your head (even if there is no bleeding).  Some medicines may interact with Pradaxa and might increase your risk of bleeding or clotting while on Pradaxa. To help avoid this, consult your healthcare provider or pharmacist prior to using any new prescription or non-prescription medications, including herbals, vitamins, non-steroidal anti-inflammatory drugs (NSAIDs) and supplements.  This website has more information on Pradaxa (dabigatran): www.RuleEnforcement.cz.

## 2016-12-12 NOTE — Telephone Encounter (Signed)
New message   TOC appt on  3.26 @ 9 am with [place]

## 2016-12-12 NOTE — Progress Notes (Signed)
Pt has orders to be discharged. Discharge instructions given and pt has no additional questions at this time. Medication regimen reviewed and pt educated. Pt verbalized understanding and has no additional questions. Telemetry box removed. IV removed and site in good condition. Pt stable and awaiting escort to vehicle.

## 2016-12-12 NOTE — Progress Notes (Signed)
Progress Note  Patient Name: [name] Date of Encounter: 12/12/2016  Primary Cardiologist: Dr. Kirk Ruths  Subjective   Lower extremity edema is much improved. His breathing is better too - not quite yet to baseline but getting there. He is hopeful for discharge today. Has not yet seen IM. Is still on IV steroids.  Inpatient Medications    Scheduled Meds: . arformoterol  15 mcg Nebulization BID  . aspirin EC  81 mg Oral Daily  . atorvastatin  80 mg Oral q1800  . bisoprolol  5 mg Oral Daily  . budesonide  0.5 mg Nebulization BID  . dabigatran  150 mg Oral Q12H  . ferrous sulfate  325 mg Oral Q breakfast  . furosemide  40 mg Oral Daily  . hydrALAZINE  10 mg Oral TID  . insulin aspart  0-15 Units Subcutaneous TID WC  . insulin glargine  10 Units Subcutaneous BH-q7a  . ipratropium-albuterol  3 mL Nebulization QID  . isosorbide mononitrate  15 mg Oral Daily  . methylPREDNISolone (SOLU-MEDROL) injection  40 mg Intravenous Q6H  . metoprolol  5 mg Intravenous Once  . morphine  15 mg Oral Q6H  . roflumilast  500 mcg Oral Daily  . sodium chloride flush  3 mL Intravenous Q12H   Continuous Infusions:  PRN Meds: sodium chloride, acetaminophen, albuterol, ALPRAZolam, guaiFENesin, HYDROcodone-acetaminophen, nitroGLYCERIN, ondansetron (ZOFRAN) IV, sodium chloride flush, traZODone   Vital Signs    Vitals:   12/11/16 2058 12/11/16 2117 12/12/16 0613 12/12/16 0910  BP:  133/61 140/65   Pulse: 60 (!) 58 64   Resp: 18 18 18    Temp:  97.8 F (36.6 C) 97.9 F (36.6 C)   TempSrc:  Oral Oral   SpO2: 97% 97% 100% 98%  Weight:   152 lb 1.6 oz (69 kg)   Height:        Intake/Output Summary (Last 24 hours) at 12/12/16 0954 Last data filed at 12/12/16 0930  Gross per 24 hour  Intake             1048 ml  Output              975 ml  Net               73 ml   Filed Weights   12/10/16 0604 12/11/16 0604 12/12/16 0613  Weight: 153 lb 12.8 oz (69.8 kg) 152 lb 4.8 oz (69.1 kg)  152 lb 1.6 oz (69 kg)    Telemetry    NSR over last 24 hours. Did have episode of NSVT on 3/9 briefly.  Physical Exam   GEN: No acute distress.  HEENT: Normocephalic, atraumatic, sclera non-icteric. Neck: No JVD or bruits. Cardiac: RRR no murmurs, rubs, or gallops.  Radials/DP/PT 1+ and equal bilaterally.  Respiratory: Diminished BS, diffuse end expiratory wheezing. Breathing is unlabored. GI: Soft, nontender, non-distended, BS +x 4. MS: no deformity. Extremities: No clubbing or cyanosis. No significant LE edema, compression hose in place. Distal pedal pulses are 2+ and equal bilaterally. Neuro:  AAOx3. Follows commands. Psych:  Responds to questions appropriately with a normal affect.  Labs    Chemistry Recent Labs Lab 12/06/16 1909 12/07/16 0034 12/10/16 0529 12/11/16 0014  NA 144 143 137 137  K 4.6 3.7 4.3 4.3  CL 100* 97* 100* 99*  CO2 32 34* 28 27  GLUCOSE 126* 111* 153* 185*  BUN 18 19 31* 39*  CREATININE 1.32* 1.19 1.02 1.17  CALCIUM 9.5 8.9  8.8* 8.8*  PROT 5.9*  --   --   --   ALBUMIN 3.4*  --   --   --   AST 22  --   --   --   ALT 17  --   --   --   ALKPHOS 99  --   --   --   BILITOT 0.7  --   --   --   GFRNONAA 54* >60 >60 >60  GFRAA >60 >60 >60 >60  ANIONGAP 12 12 9 11      Hematology Recent Labs Lab 12/10/16 0529 12/11/16 0520 12/12/16 0353  WBC 9.6 10.6* 8.8  RBC 3.50* 3.67* 3.41*  HGB 10.6* 11.3* 10.4*  HCT 33.7* 35.3* 32.7*  MCV 96.3 96.2 95.9  MCH 30.3 30.8 30.5  MCHC 31.5 32.0 31.8  RDW 14.0 13.9 14.1  PLT 144* 144* 134*    Cardiac EnzymesNo results for input(s): TROPONINI in the last 168 hours. No results for input(s): TROPIPOC in the last 168 hours.   BNPNo results for input(s): BNP, PROBNP in the last 168 hours.   DDimer No results for input(s): DDIMER in the last 168 hours.   Radiology    No results found.  Cardiac Studies   LHC in 2012 showed scattered CAD including 90% prox LAD with failed attempt at PCI, tx medically  (has collaterals from RCA; also has 50% mRCA stenosis).   Patient Profile     [phone] with history of severe CAD (tx medically 2012), large AAA (7.1cm on imaging 08/2016!), renal artery stenosis, carotid stenosis, left subclavian stenosis, ischemic cardiomyopathy, chronic combined CHF with normalized EF, severe COPD, prior ARF (requiring DC of ARB), former EtOH, left upper lobe spiculated mass followed by pulmonology, and PAF (not previously on [place] due to AAA, CHADSVASC 5), NSVT who presented with leg edema and new diagnosis of RLE DVT. Given his medical complexity has been assessed by vascular surgery as well as the cardiology service, recommendations outlined in the chart. He was placed on heparin then transitioned to Coumadin.  Assessment & Plan    1. Right leg DVT - Case has been discussed with vascular surgery who also spoke with Dr. Sammuel Hines at [facility] in light of medical complexity. Plan is heparin with transition to oral anticoagulation for a three-month course. With the patient's very large abdominal aortic aneurysm, chart indicates discussion between Dr. Radford Pax and Dr. Stanford Breed with recommendation for Pradaxa since there is a reversal agent if needed and with previous erratic INR levels on Coumadin. Has RLE compression stocking to reduce the risk of post-thrombotic syndrome.  2. Large abdominal aortic aneurysm measuring 7.1 cm by noncontrast CT in November 2017 - Patient is asymptomatic at this time. Case has been discussed with Dr. Sammuel Hines at [facility] with plan for outpatient follow-up at this point (see Dr. Theodosia Blender note 3/9).  3. Chronic diastolic heart failure - clinically stable at this point.  4. CAD with severe disease in the LAD distribution associated with right-to-left collaterals - This has been managed medically. No progressive angina symptoms. Will review with MD whether to consider discontinuation of aspirin now that he is on Pradaxa with mild drop in Hgb.  5. COPD exacerbation - followed  by internal medicine, appreciate their help this admission - would like their input on suitability for discharge and plan for med taper.  6. Paroxysmal atrial fibrillation - recurrence noted this admission, currently maintaining sinus rhythm.  7. Anemia/thrombocytopenia - will need to be followed closely on anticoag;  previously 12-13 range in the last several months.  Signed, Charlie Pitter, PA-C  12/12/2016, 9:54 AM

## 2016-12-13 ENCOUNTER — Encounter: Payer: Self-pay | Admitting: Pharmacist

## 2016-12-13 ENCOUNTER — Other Ambulatory Visit: Payer: Self-pay | Admitting: Pharmacist

## 2016-12-13 ENCOUNTER — Other Ambulatory Visit: Payer: Self-pay | Admitting: *Deleted

## 2016-12-13 ENCOUNTER — Telehealth: Payer: Self-pay | Admitting: *Deleted

## 2016-12-13 NOTE — Patient Outreach (Signed)
[facility]) Care Management  12/13/2016  [name] [birth year] [id number]   RN attempted outreach call today for transition of care however unsuccessful post op discharge on 3/13 from the hospital. RN able to leave a HIPAA approved voice message. Will continue outreach calls accordingly for possible [facility] services.  [name], RN Care Management Coordinator [place] Office [phone]

## 2016-12-13 NOTE — Telephone Encounter (Signed)
Patient contacted regarding discharge from [facility] on December 12, 2016.  Patient understands to follow up with provider B. Strader, PA on 12/25/2016 at 0900 at [facility]. Patient understands discharge instructions? YES Patient understands medications and regiment? YES Patient understands to bring all medications to this visit? YES

## 2016-12-13 NOTE — Telephone Encounter (Signed)
Spoke with dr Eddie Dibbles office. They took the information and they can see the records in care everywhere. They are going to call the patient after they discuss with dr Sammuel Hines.

## 2016-12-13 NOTE — Telephone Encounter (Signed)
Charlie Pitter, PA-C  Cristopher Estimable, RN        Suella Broad,  This is a patient of Dr. Jacalyn Lefevre. He has a known very large AAA and has previously rescheduled his vascular follow-up at [facility] (Dr. Sammuel Hines). Unfortunately he was recently found to have a DVT and now has to be on a blood thinner, so this aneurysm needs follow-up. The case was reportedly discussed with Dr. Sammuel Hines during this admission by our team.   Can you please contact [facility] to help schedule this appointment? Needs to be ASAP to discuss next steps for therapy to help planning now that he will be on blood thinner for approximately 3 months. He's being discharged today, so please have the office call him with this appointment info. Thanks.   Dayna Dunn PA-C

## 2016-12-13 NOTE — Patient Outreach (Signed)
[facility]) Care Management  12/13/2016  [name] 1951-01-06 [id number]  Successful phone outreach to patient, HIPAA details verified.    Noted patient was discharged from hospital this month with a new DVT and initiated on dabigatran for 3 months.  Patient reports initial prescription yesterday cost >$200.  Per review of [employer] formulary, his plan has a deductible and dabigatran appears to be Tier 4 ($97 co-pay/30 day supply) at preferred pharmacy.    Discussed with patient that Dr Creig Hines had replied to [facility] Pharmacist regarding patient's cost concerns for nebs:   Message  Received: 2 weeks ago  Message Contents  Javier Glazier, MD  [place], [facility]        I also asked him to look into assistance through his DME company but if that was not available or an option then we may have to switch back to the Symbicort he was on previously.    Patient reports continued cost of budesonide and arformoterol nebs will be too much.  Manufacturers don't appear to offer patient assistance for these medications to Medicare beneficiaries and co-insurance may be up to 20% under Medicare Part B.    Advised patient contact his pulmonologist regarding potential alternatives.    [employer] ([employer])  patient assistance program requires Part D beneficiaries spend $600 out-of-pocket on prescriptions.  [employer] Ruthe Mannan) patient assistance evaluates Part D beneficiaries on a case-by-case basis.  [employer] (Symbicort) patient assistance requires Part D beneficiaries spend at least 3% of household income on out-of-pocket prescription drug cost.    Offered a home visit to patient, he declined.   Discussed [employer] patient assistance program (dabigatran)---he is interested in applying.  Discussed [employer] patient assistance (albuterol---proventil inhaler)---he is interested in applying.   Plan:  Will mail patient application for [employer]ck and [employer] Patient assistance programs.   Will continue to follow-up with patient regarding medication assistance needs.    Karrie Meres, PharmD, [place] [phone]

## 2016-12-14 ENCOUNTER — Other Ambulatory Visit: Payer: Self-pay | Admitting: *Deleted

## 2016-12-14 DIAGNOSIS — J449 Chronic obstructive pulmonary disease, unspecified: Secondary | ICD-10-CM | POA: Diagnosis not present

## 2016-12-14 NOTE — Patient Outreach (Signed)
[facility]) Care Management  12/14/2016  DEONTEZ KLINKE [birth year] [id number]   RN second outreach attempt post hospital discharge on 3/13 however remains unsuccessful. RN able to leave a message with family member. RN will scheduled another transition of care call from tomorrow. Will also outreach to [place] ([place]) who is currently involved with pt's for pharmacy needs.  [name], RN Care Management Coordinator [place] Office [phone]

## 2016-12-14 NOTE — Telephone Encounter (Signed)
Pt is aware can take up to 3 business days °

## 2016-12-15 ENCOUNTER — Encounter: Payer: Self-pay | Admitting: *Deleted

## 2016-12-15 ENCOUNTER — Other Ambulatory Visit: Payer: Self-pay | Admitting: *Deleted

## 2016-12-15 MED ORDER — MORPHINE SULFATE 15 MG PO TABS: 15.0000 mg | ORAL_TABLET | Freq: Four times a day (QID) | ORAL | 0 refills | Status: DC

## 2016-12-15 NOTE — Telephone Encounter (Signed)
Rx printed

## 2016-12-15 NOTE — Patient Outreach (Signed)
[name] Care Management  12/15/2016  [name] [birth year] [id number]   RN completed an outreach call to pt today for transition of care. RN able to introduce the [facility] program and available services. Identifiers and purpose for today's call explained as pt receptive and available to discuss further. Pt recently discharged from the hospital and understands his discharge orders verified today with a complete transition of care template.   Pt verified pt's understanding of all his medications with a sufficient supply. Also verified all upcoming appointments however primary visit not scheduled until 4/9. RN encouraged pt has a sooner appointment with his provider due to his recent hospitalization. Pt indicated he would call and attempt to reschedule. RN informed pt to inform the primary care office of his recent hospitalization and requested a hospital follow up if needed (pt with understanding).  Discussed pt's medical condition related to HF (controlled) and COPD as pt verified the medications for his COPD with nebulizers and inhalers. Along with the proper use of these medications. Also reviewed the COPD action zone for pt's knowledge base and understanding of what to do if acute symptoms should occur. Pt reports he is in the GREEN zone today with no symptoms mentioned.   Appointments verified by pt today for the following: 3/26- Bernerd Pho PA ([place]) 4/9- Dr. [name] (Primary) 4/11-Dr. Creig Hines (Pulmonology) 6/25 Dr. Stanford Breed (Cardiology)  Based upon the ongoing medical condition RN offered home visits for community case manager to further education and monitor his COPD and/or HF however pt decline. RN also offered ongoing transition of care calls and telephone disease management. Pt also declined these services also and does not wish to proceed with ongoing transition of care calls. Will close case management and notify pt's primary provider of pt's  disposition with [facility].  Patient was recently discharged from hospital and all medications have been reviewed.  [name], RN Care Management Coordinator [place] Office [phone]

## 2016-12-15 NOTE — Telephone Encounter (Signed)
Rx placed up front, patient will pick up at his appointment.

## 2016-12-15 NOTE — Telephone Encounter (Signed)
He was hospitalized from 3/7 to 3/13 for DVT. He is supposed to need a follow up appt after hospital discharge. In regard to medication refill, his last Rx was filled 11/16/16, so he should have medication left, at least until 3/21 or 3/22.  Last visit in 10/2016 3 months f/u was recommended for pain management and anxiety.  Thanks, BJ
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
## 2016-12-19 ENCOUNTER — Encounter: Payer: Self-pay | Admitting: Family Medicine

## 2016-12-19 ENCOUNTER — Ambulatory Visit (INDEPENDENT_AMBULATORY_CARE_PROVIDER_SITE_OTHER): Payer: Medicare HMO | Admitting: Family Medicine

## 2016-12-19 VITALS — BP 130/80 | HR 67 | Resp 12 | Ht 67.0 in | Wt 147.5 lb

## 2016-12-19 DIAGNOSIS — D696 Thrombocytopenia, unspecified: Secondary | ICD-10-CM | POA: Diagnosis not present

## 2016-12-19 DIAGNOSIS — M5417 Radiculopathy, lumbosacral region: Secondary | ICD-10-CM | POA: Diagnosis not present

## 2016-12-19 DIAGNOSIS — F411 Generalized anxiety disorder: Secondary | ICD-10-CM

## 2016-12-19 DIAGNOSIS — I82401 Acute embolism and thrombosis of unspecified deep veins of right lower extremity: Secondary | ICD-10-CM | POA: Diagnosis not present

## 2016-12-19 DIAGNOSIS — J441 Chronic obstructive pulmonary disease with (acute) exacerbation: Secondary | ICD-10-CM | POA: Diagnosis not present

## 2016-12-19 DIAGNOSIS — M5416 Radiculopathy, lumbar region: Secondary | ICD-10-CM

## 2016-12-19 DIAGNOSIS — G894 Chronic pain syndrome: Secondary | ICD-10-CM

## 2016-12-19 LAB — CBC WITH DIFFERENTIAL/PLATELET
BASOS ABS: 0 10*3/uL (ref 0.0–0.1)
Basophils Relative: 0.2 % (ref 0.0–3.0)
Eosinophils Absolute: 0.1 10*3/uL (ref 0.0–0.7)
Eosinophils Relative: 0.7 % (ref 0.0–5.0)
HCT: 40.2 % (ref 39.0–52.0)
HEMOGLOBIN: 13.2 g/dL (ref 13.0–17.0)
LYMPHS ABS: 1.6 10*3/uL (ref 0.7–4.0)
Lymphocytes Relative: 10.4 % — ABNORMAL LOW (ref 12.0–46.0)
MCHC: 32.9 g/dL (ref 30.0–36.0)
MCV: 95.6 fl (ref 78.0–100.0)
MONO ABS: 0.5 10*3/uL (ref 0.1–1.0)
Monocytes Relative: 3.5 % (ref 3.0–12.0)
Neutro Abs: 13.1 10*3/uL — ABNORMAL HIGH (ref 1.4–7.7)
Neutrophils Relative %: 85.2 % — ABNORMAL HIGH (ref 43.0–77.0)
Platelets: 180 10*3/uL (ref 150.0–400.0)
RBC: 4.21 Mil/uL — AB (ref 4.22–5.81)
RDW: 15.5 % (ref 11.5–15.5)
WBC: 15.4 10*3/uL — AB (ref 4.0–10.5)

## 2016-12-19 NOTE — Patient Instructions (Signed)
A few things to remember from today's visit:   Chronic pain disorder  Generalized anxiety disorder  Acute deep vein thrombosis (DVT) of right lower extremity, unspecified vein (HCC) - Plan: CBC with Differential/Platelet  Acute exacerbation of chronic obstructive pulmonary disease (COPD) (HCC)  Lumbar back pain with radiculopathy affecting right lower extremity  Thrombocytopenia ([place]) - Plan: CBC with Differential/Platelet  No changes today. Pain medication is a controlled med and needs to be in a safe place.   Pain is chronic and the goal with medication is to decrease pain to a level when you are able to function, pain will not be zero. Opioid medications have many side effects: constipation, nausea, vomiting,sedation, decreased psychomotor function, urinary retention, addiction among some. Also increase risk of pneumonia.  Because opioids are controlled medications refills cannot be given, you may need to pick up a prescription monthly, and undergo urine toxic screenings to verify the presence of medication in urine.  Illegal substance are not tolerated when you are taking an opioid medication for pain management. Bring your medications with you for office visits.  At some point if I feel like pain is not adequately controlled, I will recommend referral to pain specialists.     Please be sure medication list is accurate. If a new problem present, please set up appointment sooner than planned today.

## 2016-12-19 NOTE — Progress Notes (Signed)
Pre visit review using our clinic review tool, if applicable. No additional management support is needed unless otherwise documented below in the visit note. 
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
## 2016-12-25 ENCOUNTER — Encounter: Payer: Self-pay | Admitting: Student

## 2016-12-25 ENCOUNTER — Ambulatory Visit (INDEPENDENT_AMBULATORY_CARE_PROVIDER_SITE_OTHER): Payer: Medicare HMO | Admitting: Student

## 2016-12-25 VITALS — BP 111/61 | HR 81 | Ht 67.0 in | Wt 155.0 lb

## 2016-12-25 DIAGNOSIS — I5032 Chronic diastolic (congestive) heart failure: Secondary | ICD-10-CM | POA: Diagnosis not present

## 2016-12-25 DIAGNOSIS — I2510 Atherosclerotic heart disease of native coronary artery without angina pectoris: Secondary | ICD-10-CM

## 2016-12-25 DIAGNOSIS — I714 Abdominal aortic aneurysm, without rupture, unspecified: Secondary | ICD-10-CM

## 2016-12-25 DIAGNOSIS — I824Z1 Acute embolism and thrombosis of unspecified deep veins of right distal lower extremity: Secondary | ICD-10-CM

## 2016-12-25 DIAGNOSIS — I480 Paroxysmal atrial fibrillation: Secondary | ICD-10-CM

## 2016-12-25 MED ORDER — DABIGATRAN ETEXILATE MESYLATE 150 MG PO CAPS: 150.0000 mg | ORAL_CAPSULE | Freq: Two times a day (BID) | ORAL | 0 refills | Status: AC

## 2016-12-25 NOTE — Patient Instructions (Addendum)
Medication Instructions:  TAKE ADDITIONAL a 20mg  Lasix as needed of weight gain as needed  IF YOU GAIN MORE THAN 3LBS OVER NIGHT OR  5LBS IN A WEEK CONTACT THE OFFICE  Labwork: None   Testing/Procedures: None   Follow-Up: Your physician recommends that you schedule a follow-up appointment in: [place].  Any Other Special Instructions Will Be Listed Below (If Applicable).  If you need a refill on your cardiac medications before your next appointment, please call your pharmacy.

## 2016-12-29 ENCOUNTER — Other Ambulatory Visit: Payer: Self-pay | Admitting: Pulmonary Disease

## 2016-12-29 DIAGNOSIS — J449 Chronic obstructive pulmonary disease, unspecified: Secondary | ICD-10-CM

## 2017-01-01 ENCOUNTER — Other Ambulatory Visit: Payer: Self-pay | Admitting: Pharmacist

## 2017-01-01 NOTE — Patient Outreach (Signed)
[facility]) Care Management  01/01/2017  [name] 1951-08-03 [id number]  Follow-up call to patient regarding patient assistance applications.  No answer, HIPAA compliant message left requesting return call.   Noted per review of notes from [facility], patient declined [facility] RN services.   Plan:  Will make another outreach attempt to patient in the next week if no return call.   Karrie Meres, PharmD, [place] [phone]

## 2017-01-08 ENCOUNTER — Other Ambulatory Visit: Payer: Self-pay | Admitting: Family Medicine

## 2017-01-08 ENCOUNTER — Ambulatory Visit: Payer: Commercial Managed Care - HMO | Admitting: Family Medicine

## 2017-01-08 DIAGNOSIS — F411 Generalized anxiety disorder: Secondary | ICD-10-CM

## 2017-01-10 ENCOUNTER — Ambulatory Visit (INDEPENDENT_AMBULATORY_CARE_PROVIDER_SITE_OTHER): Payer: Medicare HMO | Admitting: Pulmonary Disease

## 2017-01-10 ENCOUNTER — Encounter: Payer: Self-pay | Admitting: Pulmonary Disease

## 2017-01-10 VITALS — BP 106/70 | HR 88 | Ht 67.0 in | Wt 150.2 lb

## 2017-01-10 DIAGNOSIS — J449 Chronic obstructive pulmonary disease, unspecified: Secondary | ICD-10-CM | POA: Diagnosis not present

## 2017-01-10 DIAGNOSIS — R918 Other nonspecific abnormal finding of lung field: Secondary | ICD-10-CM | POA: Diagnosis not present

## 2017-01-10 DIAGNOSIS — G4734 Idiopathic sleep related nonobstructive alveolar hypoventilation: Secondary | ICD-10-CM | POA: Diagnosis not present

## 2017-01-10 DIAGNOSIS — I82402 Acute embolism and thrombosis of unspecified deep veins of left lower extremity: Secondary | ICD-10-CM

## 2017-01-10 MED ORDER — BUDESONIDE-FORMOTEROL FUMARATE 160-4.5 MCG/ACT IN AERO: 2.0000 | INHALATION_SPRAY | Freq: Two times a day (BID) | RESPIRATORY_TRACT | 0 refills | Status: AC

## 2017-01-10 MED ORDER — ROFLUMILAST 500 MCG PO TABS: 500.0000 ug | ORAL_TABLET | Freq: Every day | ORAL | 0 refills | Status: AC

## 2017-01-10 NOTE — Patient Instructions (Signed)
   Call me if you have any breathing problems or questions.  Let me know when you hit the cutoff for prescription drug assistance for the Symbicort inhaler and we will re-submit the paperwork.   If your breathing gets worse then increase your Prednisone to 20mg  daily and call me.
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
## 2017-01-10 NOTE — Addendum Note (Signed)
Addended by: Tyson Dense on: 01/10/2017 02:14 PM   Modules accepted: Orders

## 2017-01-11 ENCOUNTER — Other Ambulatory Visit: Payer: Self-pay | Admitting: Pharmacist

## 2017-01-11 ENCOUNTER — Other Ambulatory Visit: Payer: Self-pay | Admitting: Family Medicine

## 2017-01-11 DIAGNOSIS — F411 Generalized anxiety disorder: Secondary | ICD-10-CM

## 2017-01-11 DIAGNOSIS — I774 Celiac artery compression syndrome: Secondary | ICD-10-CM | POA: Diagnosis not present

## 2017-01-11 DIAGNOSIS — I701 Atherosclerosis of renal artery: Secondary | ICD-10-CM | POA: Diagnosis not present

## 2017-01-11 DIAGNOSIS — I255 Ischemic cardiomyopathy: Secondary | ICD-10-CM | POA: Diagnosis not present

## 2017-01-11 DIAGNOSIS — I716 Thoracoabdominal aortic aneurysm, without rupture: Secondary | ICD-10-CM | POA: Diagnosis not present

## 2017-01-11 DIAGNOSIS — I82401 Acute embolism and thrombosis of unspecified deep veins of right lower extremity: Secondary | ICD-10-CM | POA: Diagnosis not present

## 2017-01-11 DIAGNOSIS — I2510 Atherosclerotic heart disease of native coronary artery without angina pectoris: Secondary | ICD-10-CM | POA: Diagnosis not present

## 2017-01-11 DIAGNOSIS — I10 Essential (primary) hypertension: Secondary | ICD-10-CM | POA: Diagnosis not present

## 2017-01-11 DIAGNOSIS — I6523 Occlusion and stenosis of bilateral carotid arteries: Secondary | ICD-10-CM | POA: Diagnosis not present

## 2017-01-11 DIAGNOSIS — I708 Atherosclerosis of other arteries: Secondary | ICD-10-CM | POA: Diagnosis not present

## 2017-01-11 NOTE — Patient Outreach (Signed)
[facility]) Care Management  01/11/2017  DAMEAN POFFENBERGER 1951-05-22 [id number]  Second outreach attempt to patient to follow-up if he is still interested in applying to manufacturer patient assistance programs for his inhalers and/or dabigatran.  Applications had been mailed to patient and have not been received back from patient.    Spouse answered phone and states patient was not available.  HIPAA compliant message left with patient's spouse requesting patient to call [facility] Pharmacist back.   Plan:  Will make a third outreach attempt to patient in the next week.   Karrie Meres, PharmD, [place] [phone]

## 2017-01-11 NOTE — Telephone Encounter (Signed)
Rx for Alprazolam 0.25 mg can be called in to continue 1 tab tid as needed, #90/0. Thanks, BJ

## 2017-01-11 NOTE — Telephone Encounter (Signed)
Rx phoned in.   

## 2017-01-12 ENCOUNTER — Other Ambulatory Visit: Payer: Self-pay | Admitting: Pharmacist

## 2017-01-12 NOTE — Patient Outreach (Signed)
[facility]) Care Management  01/12/2017  HENSLEY TREAT [birth year] [id number]  Unsuccessful phone outreach to patient. HIPAA compliant message left requesting return call.  Purpose of call was to follow-up on if patient is still wanting to apply to manufacturer patient assistance.    This was third outreach attempt to patient.   Plan:  Will make one final phone outreach call to patient next week, if no reply, will send patient an outreach letter.   Karrie Meres, PharmD, [place] [phone]

## 2017-01-14 DIAGNOSIS — J449 Chronic obstructive pulmonary disease, unspecified: Secondary | ICD-10-CM | POA: Diagnosis not present

## 2017-01-15 ENCOUNTER — Other Ambulatory Visit: Payer: Self-pay | Admitting: Pharmacist

## 2017-01-15 ENCOUNTER — Encounter: Payer: Self-pay | Admitting: Pharmacist

## 2017-01-15 NOTE — Patient Outreach (Signed)
[facility]) Care Management  01/15/2017  [name] Jun 17, 1951 [id number]  Fourth unsuccessful phone outreach attempt to patient.  HIPAA compliant message left requesting return call.   Plan:  As patient has not returned call to [place] Pharmacist, will mail an outreach letter to patient.   If no reply to outreach letter in 10 business days, will close patient's pharmacy case.   Karrie Meres, PharmD, [place] [phone]

## 2017-01-16 DIAGNOSIS — C44622 Squamous cell carcinoma of skin of right upper limb, including shoulder: Secondary | ICD-10-CM | POA: Diagnosis not present

## 2017-01-16 DIAGNOSIS — Z85828 Personal history of other malignant neoplasm of skin: Secondary | ICD-10-CM | POA: Diagnosis not present

## 2017-01-18 ENCOUNTER — Other Ambulatory Visit: Payer: Self-pay | Admitting: Family Medicine

## 2017-01-18 ENCOUNTER — Telehealth: Payer: Self-pay | Admitting: Family Medicine

## 2017-01-18 ENCOUNTER — Telehealth: Payer: Self-pay | Admitting: Pulmonary Disease

## 2017-01-18 DIAGNOSIS — M5416 Radiculopathy, lumbar region: Secondary | ICD-10-CM

## 2017-01-18 DIAGNOSIS — G894 Chronic pain syndrome: Secondary | ICD-10-CM

## 2017-01-18 MED ORDER — IPRATROPIUM BROMIDE 0.02 % IN SOLN: RESPIRATORY_TRACT | 5 refills | Status: AC

## 2017-01-18 NOTE — Telephone Encounter (Signed)
Pt need new Rx for morphine   Pt is aware of 3 business days for refills and someone will call when ready for pickup.

## 2017-01-18 NOTE — Telephone Encounter (Signed)
Rx for atrovent  was sent with dx code

## 2017-01-22 MED ORDER — MORPHINE SULFATE 15 MG PO TABS: 15.0000 mg | ORAL_TABLET | Freq: Four times a day (QID) | ORAL | 0 refills | Status: AC

## 2017-01-22 NOTE — Telephone Encounter (Signed)
Rx placed up front ready for pick up.  Message left with wife to let patient know he can come pick the Rx up.

## 2017-01-22 NOTE — Telephone Encounter (Signed)
Rx for Morphine IR 15 mg can be printed, I would a note to pharmacists to fill 60 tabs at the time. # 120/0. Thanks, BJ

## 2017-01-22 NOTE — Telephone Encounter (Signed)
Pt following up on request refill   morphine (MSIR) 15 MG tablet  Thank you

## 2017-01-23 ENCOUNTER — Other Ambulatory Visit: Payer: Self-pay

## 2017-01-23 MED ORDER — GLUCOSE BLOOD VI STRP: ORAL_STRIP | 3 refills | Status: AC

## 2017-01-29 ENCOUNTER — Other Ambulatory Visit: Payer: Self-pay | Admitting: Pharmacist

## 2017-01-29 NOTE — Patient Outreach (Signed)
[facility]) Care Management  01/29/2017  DEION SWIFT 1951/05/25 [id number]  Unable to maintain contact with patient---four phone calls made with no return call and outreach letter mailed to patient with no response received.   Plan:  Will close pharmacy case due to inability to maintain contact with patient.    Karrie Meres, PharmD, [place] [phone]

## 2017-02-01 ENCOUNTER — Telehealth: Payer: Self-pay | Admitting: Pulmonary Disease

## 2017-02-01 MED ORDER — ROFLUMILAST 500 MCG PO TABS: 500.0000 ug | ORAL_TABLET | Freq: Every day | ORAL | 0 refills | Status: DC

## 2017-02-01 MED ORDER — BUDESONIDE-FORMOTEROL FUMARATE 160-4.5 MCG/ACT IN AERO: 2.0000 | INHALATION_SPRAY | Freq: Two times a day (BID) | RESPIRATORY_TRACT | 0 refills | Status: AC

## 2017-02-01 NOTE — Telephone Encounter (Signed)
Pt requesting daliresp and symbicort samples.  These have been placed up front for pickup.  Nothing further needed.

## 2017-02-02 ENCOUNTER — Other Ambulatory Visit: Payer: Self-pay | Admitting: Physician Assistant

## 2017-02-02 ENCOUNTER — Telehealth: Payer: Self-pay | Admitting: Pulmonary Disease

## 2017-02-02 NOTE — Telephone Encounter (Signed)
Called by Saralyn Pilar to refill his Prednisone prescription. He is nearly out of Prednisone. Called prescription for Prednisone 10 mg, # 90, Sig: Take 1 tablets PO Q day. No refills.

## 2017-02-04 ENCOUNTER — Inpatient Hospital Stay (HOSPITAL_COMMUNITY)
Admission: EM | Admit: 2017-02-04 | Discharge: 2017-02-07 | DRG: 299 | Disposition: A | Discharge status: Home Health | Payer: Medicare HMO | Attending: Family Medicine | Admitting: Family Medicine

## 2017-02-04 ENCOUNTER — Emergency Department (HOSPITAL_COMMUNITY): Payer: Medicare HMO

## 2017-02-04 ENCOUNTER — Encounter (HOSPITAL_COMMUNITY): Payer: Self-pay

## 2017-02-04 DIAGNOSIS — Z86718 Personal history of other venous thrombosis and embolism: Secondary | ICD-10-CM

## 2017-02-04 DIAGNOSIS — Z8249 Family history of ischemic heart disease and other diseases of the circulatory system: Secondary | ICD-10-CM

## 2017-02-04 DIAGNOSIS — A419 Sepsis, unspecified organism: Secondary | ICD-10-CM | POA: Diagnosis not present

## 2017-02-04 DIAGNOSIS — I82509 Chronic embolism and thrombosis of unspecified deep veins of unspecified lower extremity: Secondary | ICD-10-CM

## 2017-02-04 DIAGNOSIS — J4489 Other specified chronic obstructive pulmonary disease: Secondary | ICD-10-CM | POA: Diagnosis present

## 2017-02-04 DIAGNOSIS — I739 Peripheral vascular disease, unspecified: Secondary | ICD-10-CM | POA: Diagnosis present

## 2017-02-04 DIAGNOSIS — I5032 Chronic diastolic (congestive) heart failure: Secondary | ICD-10-CM | POA: Diagnosis present

## 2017-02-04 DIAGNOSIS — I714 Abdominal aortic aneurysm, without rupture, unspecified: Secondary | ICD-10-CM

## 2017-02-04 DIAGNOSIS — J440 Chronic obstructive pulmonary disease with acute lower respiratory infection: Secondary | ICD-10-CM | POA: Diagnosis present

## 2017-02-04 DIAGNOSIS — F419 Anxiety disorder, unspecified: Secondary | ICD-10-CM | POA: Diagnosis present

## 2017-02-04 DIAGNOSIS — F411 Generalized anxiety disorder: Secondary | ICD-10-CM | POA: Diagnosis not present

## 2017-02-04 DIAGNOSIS — Z880 Allergy status to penicillin: Secondary | ICD-10-CM | POA: Diagnosis not present

## 2017-02-04 DIAGNOSIS — I716 Thoracoabdominal aortic aneurysm, without rupture, unspecified: Secondary | ICD-10-CM | POA: Diagnosis present

## 2017-02-04 DIAGNOSIS — J441 Chronic obstructive pulmonary disease with (acute) exacerbation: Secondary | ICD-10-CM | POA: Diagnosis present

## 2017-02-04 DIAGNOSIS — R109 Unspecified abdominal pain: Secondary | ICD-10-CM | POA: Diagnosis not present

## 2017-02-04 DIAGNOSIS — J449 Chronic obstructive pulmonary disease, unspecified: Secondary | ICD-10-CM | POA: Diagnosis not present

## 2017-02-04 DIAGNOSIS — I480 Paroxysmal atrial fibrillation: Secondary | ICD-10-CM | POA: Diagnosis present

## 2017-02-04 DIAGNOSIS — T380X5A Adverse effect of glucocorticoids and synthetic analogues, initial encounter: Secondary | ICD-10-CM | POA: Diagnosis present

## 2017-02-04 DIAGNOSIS — Z87891 Personal history of nicotine dependence: Secondary | ICD-10-CM

## 2017-02-04 DIAGNOSIS — I10 Essential (primary) hypertension: Secondary | ICD-10-CM | POA: Diagnosis not present

## 2017-02-04 DIAGNOSIS — I5042 Chronic combined systolic (congestive) and diastolic (congestive) heart failure: Secondary | ICD-10-CM | POA: Diagnosis not present

## 2017-02-04 DIAGNOSIS — J439 Emphysema, unspecified: Secondary | ICD-10-CM | POA: Diagnosis not present

## 2017-02-04 DIAGNOSIS — R1084 Generalized abdominal pain: Secondary | ICD-10-CM | POA: Diagnosis not present

## 2017-02-04 DIAGNOSIS — Z888 Allergy status to other drugs, medicaments and biological substances status: Secondary | ICD-10-CM

## 2017-02-04 DIAGNOSIS — I2510 Atherosclerotic heart disease of native coronary artery without angina pectoris: Secondary | ICD-10-CM | POA: Diagnosis not present

## 2017-02-04 DIAGNOSIS — Z7952 Long term (current) use of systemic steroids: Secondary | ICD-10-CM | POA: Diagnosis not present

## 2017-02-04 DIAGNOSIS — E785 Hyperlipidemia, unspecified: Secondary | ICD-10-CM | POA: Diagnosis not present

## 2017-02-04 DIAGNOSIS — E876 Hypokalemia: Secondary | ICD-10-CM | POA: Diagnosis not present

## 2017-02-04 DIAGNOSIS — E7800 Pure hypercholesterolemia, unspecified: Secondary | ICD-10-CM | POA: Diagnosis not present

## 2017-02-04 DIAGNOSIS — J189 Pneumonia, unspecified organism: Secondary | ICD-10-CM | POA: Diagnosis not present

## 2017-02-04 DIAGNOSIS — I110 Hypertensive heart disease with heart failure: Secondary | ICD-10-CM | POA: Diagnosis present

## 2017-02-04 DIAGNOSIS — I82409 Acute embolism and thrombosis of unspecified deep veins of unspecified lower extremity: Secondary | ICD-10-CM | POA: Diagnosis present

## 2017-02-04 DIAGNOSIS — Z79899 Other long term (current) drug therapy: Secondary | ICD-10-CM | POA: Diagnosis not present

## 2017-02-04 LAB — I-STAT CHEM 8, ED
BUN: 23 mg/dL — AB (ref 6–20)
CALCIUM ION: 1.11 mmol/L — AB (ref 1.15–1.40)
Chloride: 94 mmol/L — ABNORMAL LOW (ref 101–111)
Creatinine, Ser: 1 mg/dL (ref 0.61–1.24)
GLUCOSE: 171 mg/dL — AB (ref 65–99)
HCT: 43 % (ref 39.0–52.0)
Hemoglobin: 14.6 g/dL (ref 13.0–17.0)
Potassium: 3.5 mmol/L (ref 3.5–5.1)
Sodium: 137 mmol/L (ref 135–145)
TCO2: 34 mmol/L (ref 0–100)

## 2017-02-04 LAB — I-STAT CG4 LACTIC ACID, ED: Lactic Acid, Venous: 2.41 mmol/L (ref 0.5–1.9)

## 2017-02-04 LAB — CBC
HCT: 43.5 % (ref 39.0–52.0)
HEMOGLOBIN: 14.2 g/dL (ref 13.0–17.0)
MCH: 31.7 pg (ref 26.0–34.0)
MCHC: 32.6 g/dL (ref 30.0–36.0)
MCV: 97.1 fL (ref 78.0–100.0)
PLATELETS: 137 10*3/uL — AB (ref 150–400)
RBC: 4.48 MIL/uL (ref 4.22–5.81)
RDW: 15 % (ref 11.5–15.5)
WBC: 17.8 10*3/uL — ABNORMAL HIGH (ref 4.0–10.5)

## 2017-02-04 LAB — COMPREHENSIVE METABOLIC PANEL
ALBUMIN: 3.8 g/dL (ref 3.5–5.0)
ALK PHOS: 79 U/L (ref 38–126)
ALT: 33 U/L (ref 17–63)
ANION GAP: 12 (ref 5–15)
AST: 25 U/L (ref 15–41)
BILIRUBIN TOTAL: 1.3 mg/dL — AB (ref 0.3–1.2)
BUN: 20 mg/dL (ref 6–20)
CALCIUM: 9.5 mg/dL (ref 8.9–10.3)
CO2: 30 mmol/L (ref 22–32)
CREATININE: 1.05 mg/dL (ref 0.61–1.24)
Chloride: 96 mmol/L — ABNORMAL LOW (ref 101–111)
GFR calc non Af Amer: >60 mL/min (ref >60)
GLUCOSE: 180 mg/dL — AB (ref 65–99)
Potassium: 3.8 mmol/L (ref 3.5–5.1)
SODIUM: 138 mmol/L (ref 135–145)
Total Protein: 6.1 g/dL — ABNORMAL LOW (ref 6.5–8.1)

## 2017-02-04 LAB — LIPASE, BLOOD: Lipase: 16 U/L (ref 11–51)

## 2017-02-04 LAB — URINALYSIS, ROUTINE W REFLEX MICROSCOPIC
BILIRUBIN URINE: NEGATIVE
Glucose, UA: NEGATIVE mg/dL
HGB URINE DIPSTICK: NEGATIVE
KETONES UR: NEGATIVE mg/dL
Leukocytes, UA: NEGATIVE
NITRITE: NEGATIVE
PROTEIN: NEGATIVE mg/dL
Specific Gravity, Urine: 1.011 (ref 1.005–1.030)
pH: 6 (ref 5.0–8.0)

## 2017-02-04 LAB — SAMPLE TO BLOOD BANK

## 2017-02-04 MED ORDER — IOPAMIDOL (ISOVUE-370) INJECTION 76%
INTRAVENOUS | Status: AC
  Administered 2017-02-04: 100 mL
  Filled 2017-02-04: qty 100

## 2017-02-04 MED ORDER — HYDROMORPHONE HCL 1 MG/ML IJ SOLN
0.5000 mg | Freq: Once | INTRAMUSCULAR | Status: AC
  Administered 2017-02-04: 0.5 mg via INTRAVENOUS
  Filled 2017-02-04: qty 1

## 2017-02-04 MED ORDER — HYDROMORPHONE HCL 1 MG/ML IJ SOLN
0.5000 mg | Freq: Once | INTRAMUSCULAR | Status: AC
Start: 2017-02-04 — End: 2017-02-05
  Administered 2017-02-05: 0.5 mg via INTRAVENOUS
  Filled 2017-02-04: qty 1

## 2017-02-04 MED ORDER — MORPHINE SULFATE (PF) 4 MG/ML IV SOLN
4.0000 mg | Freq: Once | INTRAVENOUS | Status: AC
  Administered 2017-02-04: 4 mg via INTRAVENOUS
  Filled 2017-02-04: qty 1

## 2017-02-04 MED ORDER — IPRATROPIUM BROMIDE 0.02 % IN SOLN
0.5000 mg | Freq: Once | RESPIRATORY_TRACT | Status: AC
  Administered 2017-02-04: 0.5 mg via RESPIRATORY_TRACT
  Filled 2017-02-04: qty 2.5

## 2017-02-04 MED ORDER — ALBUTEROL SULFATE (2.5 MG/3ML) 0.083% IN NEBU
5.0000 mg | INHALATION_SOLUTION | Freq: Once | RESPIRATORY_TRACT | Status: AC
  Administered 2017-02-04: 5 mg via RESPIRATORY_TRACT
  Filled 2017-02-04: qty 6

## 2017-02-04 NOTE — ED Triage Notes (Signed)
Pt complaining of abdominal pain. Pt denies any chest pain or SOB. Pt denies any N/V/D. Pt states began this morning. Pt a/o x 4, NAD.

## 2017-02-04 NOTE — H&P (Addendum)
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 both renals and abuts the SMA, mesenteric vessels are patent. The increase in size of AAA is likely the etiology for abdominal pain. Vascular surgeon, Dr. Oneida Alar was consulted-->not a candidate for surgical treatment. Will focus on blood pressure control and decrease shear stress to the aortic wall. Pt is fully aware of the poor prognosis including the death if his aneurysm burst. I discussed with PCCM, Dr. Chase Caller, who recommended to stepdown admission.  -will admit to SDU as inpt -Will increase the dose of bisoprolol from 5-10 mg daily (pt is not good candidate for IV labetalol due to severe COPD) -will incresae oral hydralazine dose from 10-50 mg tid -continue add amlodipine 5 mg daily, will titrate up to target SBP 100-120 mmHG -  pt is also on lasix for CHF -prn dilaudid for pain -continue home oral morphine 15 mg q6h  Possible mild HCAP and sepsis: pt states his sputum color is darker. CTA showed "Tree-in-bud" opacities in the right lung base consistent with an infectious process. Patient admitted critically for sepsis with leukocytosis and elevated lactate 2.41. - start levaquin - Mucinex for cough    - prn Albuterol Nebs, DuoNeb neb for SOB - Urine legionella and S. pneumococcal antigen - Follow up blood culture x2, sputum culture. - will get Procalcitonin and trend lactic acid level per sepsis protocol - IVF: NS 100 cc/h (will not give NS bolus since it may increased the shear stress to aortic wall, also due to CHF)  CODP: stable -in addition to above treatment for HCAP, will continue home prednisone 30 mg daily -Dulera inhaler and Roflumilast  Chronic diastolic congestive heart failure: 2-D echo on 09/14/16 showed EF of 55-60 percent with grade 1 diastolic dysfunction. Patient does not have leg edema or JVD. BNP 192.9. CHF seems to be compensated. -Continue Lasix, but will start tomorrow, hope sepsis is under control by then  HLD: -Lipitor  CAD: no CP -continue lipitor and bisoprolol and Imdure  Anxiety: -prn Xanax:  DVT (deep venous thrombosis): pt wants to continue Pradaxa. He understands that pradaxa may make the the situation worse if the aneurysm expands and has wall tearing. -continue pradaxa.   Atrial Fibrillation: CHA2DS2-VASc Score is 4, needs oral anticoagulation. Patient is on Pradaxa at home. Heart rate is well controlled. -continue Bisoprolol and pradaxa  DVT ppx: on Pradaxa Code Status: Full code Family Communication: Yes, patient's  Wife at bed side Disposition Plan:  Anticipate discharge back to previous home environment Consults called:  Vascular surgeon, Dr. Oneida Alar Admission status: SDU/inpation       Date of Service 02/05/2017    Ivor Costa [facility] Pager [phone]  If 7PM-7AM, please contact night-coverage [email or web address] Password [email or web address] 02/05/2017, 3:51 AM

## 2017-02-04 NOTE — ED Notes (Signed)
Patient transported to CT 
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
## 2017-02-04 NOTE — ED Notes (Signed)
ED Provider at bedside. 

## 2017-02-05 ENCOUNTER — Inpatient Hospital Stay (HOSPITAL_COMMUNITY): Payer: Medicare HMO

## 2017-02-05 DIAGNOSIS — T380X5A Adverse effect of glucocorticoids and synthetic analogues, initial encounter: Secondary | ICD-10-CM | POA: Diagnosis present

## 2017-02-05 DIAGNOSIS — R109 Unspecified abdominal pain: Secondary | ICD-10-CM | POA: Diagnosis present

## 2017-02-05 DIAGNOSIS — I480 Paroxysmal atrial fibrillation: Secondary | ICD-10-CM | POA: Diagnosis present

## 2017-02-05 DIAGNOSIS — I739 Peripheral vascular disease, unspecified: Secondary | ICD-10-CM | POA: Diagnosis not present

## 2017-02-05 DIAGNOSIS — Z86718 Personal history of other venous thrombosis and embolism: Secondary | ICD-10-CM | POA: Diagnosis not present

## 2017-02-05 DIAGNOSIS — Z7952 Long term (current) use of systemic steroids: Secondary | ICD-10-CM | POA: Diagnosis not present

## 2017-02-05 DIAGNOSIS — J189 Pneumonia, unspecified organism: Secondary | ICD-10-CM | POA: Diagnosis present

## 2017-02-05 DIAGNOSIS — I10 Essential (primary) hypertension: Secondary | ICD-10-CM | POA: Diagnosis not present

## 2017-02-05 DIAGNOSIS — F411 Generalized anxiety disorder: Secondary | ICD-10-CM

## 2017-02-05 DIAGNOSIS — Z87891 Personal history of nicotine dependence: Secondary | ICD-10-CM | POA: Diagnosis not present

## 2017-02-05 DIAGNOSIS — A419 Sepsis, unspecified organism: Secondary | ICD-10-CM | POA: Diagnosis present

## 2017-02-05 DIAGNOSIS — E7800 Pure hypercholesterolemia, unspecified: Secondary | ICD-10-CM | POA: Diagnosis not present

## 2017-02-05 DIAGNOSIS — J441 Chronic obstructive pulmonary disease with (acute) exacerbation: Secondary | ICD-10-CM | POA: Diagnosis present

## 2017-02-05 DIAGNOSIS — I2510 Atherosclerotic heart disease of native coronary artery without angina pectoris: Secondary | ICD-10-CM | POA: Diagnosis present

## 2017-02-05 DIAGNOSIS — E785 Hyperlipidemia, unspecified: Secondary | ICD-10-CM | POA: Diagnosis present

## 2017-02-05 DIAGNOSIS — Z79899 Other long term (current) drug therapy: Secondary | ICD-10-CM | POA: Diagnosis not present

## 2017-02-05 DIAGNOSIS — I110 Hypertensive heart disease with heart failure: Secondary | ICD-10-CM | POA: Diagnosis present

## 2017-02-05 DIAGNOSIS — I5042 Chronic combined systolic (congestive) and diastolic (congestive) heart failure: Secondary | ICD-10-CM | POA: Diagnosis present

## 2017-02-05 DIAGNOSIS — Z880 Allergy status to penicillin: Secondary | ICD-10-CM | POA: Diagnosis not present

## 2017-02-05 DIAGNOSIS — J440 Chronic obstructive pulmonary disease with acute lower respiratory infection: Secondary | ICD-10-CM | POA: Diagnosis present

## 2017-02-05 DIAGNOSIS — I716 Thoracoabdominal aortic aneurysm, without rupture: Secondary | ICD-10-CM | POA: Diagnosis not present

## 2017-02-05 DIAGNOSIS — I82509 Chronic embolism and thrombosis of unspecified deep veins of unspecified lower extremity: Secondary | ICD-10-CM | POA: Diagnosis not present

## 2017-02-05 DIAGNOSIS — I714 Abdominal aortic aneurysm, without rupture: Secondary | ICD-10-CM | POA: Diagnosis present

## 2017-02-05 DIAGNOSIS — Z8249 Family history of ischemic heart disease and other diseases of the circulatory system: Secondary | ICD-10-CM | POA: Diagnosis not present

## 2017-02-05 DIAGNOSIS — Z888 Allergy status to other drugs, medicaments and biological substances status: Secondary | ICD-10-CM | POA: Diagnosis not present

## 2017-02-05 DIAGNOSIS — E876 Hypokalemia: Secondary | ICD-10-CM | POA: Diagnosis present

## 2017-02-05 DIAGNOSIS — J449 Chronic obstructive pulmonary disease, unspecified: Secondary | ICD-10-CM | POA: Diagnosis not present

## 2017-02-05 LAB — CBC
HCT: 42 % (ref 39.0–52.0)
HEMOGLOBIN: 13.6 g/dL (ref 13.0–17.0)
MCH: 31 pg (ref 26.0–34.0)
MCHC: 32.4 g/dL (ref 30.0–36.0)
MCV: 95.7 fL (ref 78.0–100.0)
Platelets: 134 10*3/uL — ABNORMAL LOW (ref 150–400)
RBC: 4.39 MIL/uL (ref 4.22–5.81)
RDW: 15 % (ref 11.5–15.5)
WBC: 17.8 10*3/uL — AB (ref 4.0–10.5)

## 2017-02-05 LAB — GLUCOSE, CAPILLARY: GLUCOSE-CAPILLARY: 105 mg/dL — AB (ref 65–99)

## 2017-02-05 LAB — BASIC METABOLIC PANEL
ANION GAP: 10 (ref 5–15)
BUN: 19 mg/dL (ref 6–20)
CALCIUM: 9.1 mg/dL (ref 8.9–10.3)
CO2: 31 mmol/L (ref 22–32)
Chloride: 95 mmol/L — ABNORMAL LOW (ref 101–111)
Creatinine, Ser: 0.92 mg/dL (ref 0.61–1.24)
Glucose, Bld: 150 mg/dL — ABNORMAL HIGH (ref 65–99)
Potassium: 3.1 mmol/L — ABNORMAL LOW (ref 3.5–5.1)
SODIUM: 136 mmol/L (ref 135–145)

## 2017-02-05 LAB — PROTIME-INR
INR: 1.22
PROTHROMBIN TIME: 15.5 s — AB (ref 11.4–15.2)

## 2017-02-05 LAB — TYPE AND SCREEN
ABO/RH(D): A NEG
Antibody Screen: NEGATIVE

## 2017-02-05 LAB — LACTIC ACID, PLASMA
Lactic Acid, Venous: 1.3 mmol/L (ref 0.5–1.9)
Lactic Acid, Venous: 1.2 mmol/L (ref 0.5–1.9)

## 2017-02-05 LAB — ABO/RH: ABO/RH(D): A NEG

## 2017-02-05 LAB — HIV ANTIBODY (ROUTINE TESTING W REFLEX): HIV SCREEN 4TH GENERATION: NONREACTIVE

## 2017-02-05 LAB — APTT: aPTT: 38 seconds — ABNORMAL HIGH (ref 24–36)

## 2017-02-05 LAB — STREP PNEUMONIAE URINARY ANTIGEN: STREP PNEUMO URINARY ANTIGEN: NEGATIVE

## 2017-02-05 LAB — PROCALCITONIN: Procalcitonin: <0.1 ng/mL

## 2017-02-05 LAB — BRAIN NATRIURETIC PEPTIDE: B Natriuretic Peptide: 192.9 pg/mL — ABNORMAL HIGH (ref 0.0–100.0)

## 2017-02-05 LAB — MRSA PCR SCREENING: MRSA BY PCR: NEGATIVE

## 2017-02-05 LAB — CORTISOL-AM, BLOOD: CORTISOL - AM: 7.7 ug/dL (ref 6.7–22.6)

## 2017-02-05 MED ORDER — HYDRALAZINE HCL 20 MG/ML IJ SOLN
5.0000 mg | INTRAMUSCULAR | Status: DC | PRN
  Administered 2017-02-05: 5 mg via INTRAVENOUS
  Filled 2017-02-05: qty 1

## 2017-02-05 MED ORDER — ACETAMINOPHEN 325 MG PO TABS: 650.0000 mg | ORAL_TABLET | Freq: Four times a day (QID) | ORAL | Status: DC | PRN

## 2017-02-05 MED ORDER — ROFLUMILAST 500 MCG PO TABS
500.0000 ug | ORAL_TABLET | Freq: Every day | ORAL | Status: DC
  Administered 2017-02-05 – 2017-02-07 (×3): 500 ug via ORAL
  Filled 2017-02-05 (×3): qty 1

## 2017-02-05 MED ORDER — AMLODIPINE BESYLATE 10 MG PO TABS
10.0000 mg | ORAL_TABLET | Freq: Every day | ORAL | Status: DC
  Administered 2017-02-06: 10 mg via ORAL
  Filled 2017-02-05: qty 1

## 2017-02-05 MED ORDER — LEVOFLOXACIN 750 MG PO TABS: 750.0000 mg | ORAL_TABLET | Freq: Every day | ORAL | Status: DC

## 2017-02-05 MED ORDER — BISOPROLOL FUMARATE 5 MG PO TABS
20.0000 mg | ORAL_TABLET | Freq: Every day | ORAL | Status: DC
  Administered 2017-02-06 – 2017-02-07 (×2): 20 mg via ORAL
  Filled 2017-02-05 (×2): qty 4

## 2017-02-05 MED ORDER — SODIUM CHLORIDE 0.9% FLUSH
3.0000 mL | Freq: Two times a day (BID) | INTRAVENOUS | Status: DC
  Administered 2017-02-05 – 2017-02-07 (×6): 3 mL via INTRAVENOUS

## 2017-02-05 MED ORDER — MOMETASONE FURO-FORMOTEROL FUM 200-5 MCG/ACT IN AERO: 2.0000 | INHALATION_SPRAY | Freq: Two times a day (BID) | RESPIRATORY_TRACT | Status: DC

## 2017-02-05 MED ORDER — FERROUS SULFATE 325 (65 FE) MG PO TABS
325.0000 mg | ORAL_TABLET | Freq: Every day | ORAL | Status: DC
  Administered 2017-02-05: 325 mg via ORAL
  Filled 2017-02-05: qty 1

## 2017-02-05 MED ORDER — KRILL OIL 1000 MG PO CAPS: 1.0000 | ORAL_CAPSULE | Freq: Every day | ORAL | Status: DC

## 2017-02-05 MED ORDER — TRAZODONE HCL 50 MG PO TABS: 50.0000 mg | ORAL_TABLET | Freq: Every evening | ORAL | Status: DC | PRN

## 2017-02-05 MED ORDER — POLYETHYLENE GLYCOL 3350 17 G PO PACK
17.0000 g | PACK | Freq: Two times a day (BID) | ORAL | Status: DC
  Administered 2017-02-06: 17 g via ORAL
  Filled 2017-02-05 (×4): qty 1

## 2017-02-05 MED ORDER — ISOSORBIDE MONONITRATE ER 30 MG PO TB24
15.0000 mg | ORAL_TABLET | Freq: Every day | ORAL | Status: DC
  Administered 2017-02-05 – 2017-02-06 (×2): 15 mg via ORAL
  Filled 2017-02-05 (×2): qty 1

## 2017-02-05 MED ORDER — POTASSIUM CHLORIDE CRYS ER 20 MEQ PO TBCR
40.0000 meq | EXTENDED_RELEASE_TABLET | Freq: Two times a day (BID) | ORAL | Status: AC
  Administered 2017-02-05 – 2017-02-06 (×3): 40 meq via ORAL
  Filled 2017-02-05 (×3): qty 2

## 2017-02-05 MED ORDER — DM-GUAIFENESIN ER 30-600 MG PO TB12
1.0000 | ORAL_TABLET | Freq: Two times a day (BID) | ORAL | Status: DC
  Administered 2017-02-05 – 2017-02-07 (×4): 1 via ORAL
  Filled 2017-02-05 (×6): qty 1

## 2017-02-05 MED ORDER — MOMETASONE FURO-FORMOTEROL FUM 200-5 MCG/ACT IN AERO
2.0000 | INHALATION_SPRAY | Freq: Two times a day (BID) | RESPIRATORY_TRACT | Status: DC
  Administered 2017-02-05: 2 via RESPIRATORY_TRACT
  Filled 2017-02-05: qty 8.8

## 2017-02-05 MED ORDER — SODIUM CHLORIDE 0.9 % IV SOLN
INTRAVENOUS | Status: DC
  Administered 2017-02-05 – 2017-02-06 (×2): via INTRAVENOUS

## 2017-02-05 MED ORDER — POLYETHYLENE GLYCOL 3350 17 G PO PACK: 17.0000 g | PACK | Freq: Every day | ORAL | Status: DC | PRN

## 2017-02-05 MED ORDER — ACETAMINOPHEN 650 MG RE SUPP: 650.0000 mg | Freq: Four times a day (QID) | RECTAL | Status: DC | PRN

## 2017-02-05 MED ORDER — HYDRALAZINE HCL 10 MG PO TABS: 10.0000 mg | ORAL_TABLET | Freq: Three times a day (TID) | ORAL | Status: DC

## 2017-02-05 MED ORDER — LEVOFLOXACIN IN D5W 750 MG/150ML IV SOLN
750.0000 mg | Freq: Every day | INTRAVENOUS | Status: DC
  Administered 2017-02-05: 750 mg via INTRAVENOUS
  Filled 2017-02-05: qty 150

## 2017-02-05 MED ORDER — ADULT MULTIVITAMIN W/MINERALS CH
1.0000 | ORAL_TABLET | Freq: Every day | ORAL | Status: DC
  Administered 2017-02-05 – 2017-02-07 (×3): 1 via ORAL
  Filled 2017-02-05 (×3): qty 1

## 2017-02-05 MED ORDER — IPRATROPIUM-ALBUTEROL 0.5-2.5 (3) MG/3ML IN SOLN
3.0000 mL | Freq: Four times a day (QID) | RESPIRATORY_TRACT | Status: DC
  Administered 2017-02-05 – 2017-02-07 (×8): 3 mL via RESPIRATORY_TRACT
  Filled 2017-02-05 (×9): qty 3

## 2017-02-05 MED ORDER — ATORVASTATIN CALCIUM 80 MG PO TABS
80.0000 mg | ORAL_TABLET | Freq: Every day | ORAL | Status: DC
  Administered 2017-02-05 – 2017-02-06 (×2): 80 mg via ORAL
  Filled 2017-02-05 (×2): qty 1

## 2017-02-05 MED ORDER — ONDANSETRON HCL 4 MG/2ML IJ SOLN: 4.0000 mg | Freq: Three times a day (TID) | INTRAMUSCULAR | Status: DC | PRN

## 2017-02-05 MED ORDER — FUROSEMIDE 40 MG PO TABS
40.0000 mg | ORAL_TABLET | Freq: Every day | ORAL | Status: DC
  Administered 2017-02-05 – 2017-02-07 (×3): 40 mg via ORAL
  Filled 2017-02-05 (×3): qty 1

## 2017-02-05 MED ORDER — MORPHINE SULFATE 15 MG PO TABS
15.0000 mg | ORAL_TABLET | Freq: Four times a day (QID) | ORAL | Status: DC
  Administered 2017-02-05 – 2017-02-07 (×10): 15 mg via ORAL
  Filled 2017-02-05 (×10): qty 1

## 2017-02-05 MED ORDER — AMLODIPINE BESYLATE 5 MG PO TABS
5.0000 mg | ORAL_TABLET | Freq: Every day | ORAL | Status: DC
Start: 2017-02-05 — End: 2017-02-05
  Administered 2017-02-05: 5 mg via ORAL
  Filled 2017-02-05 (×2): qty 1

## 2017-02-05 MED ORDER — IPRATROPIUM-ALBUTEROL 0.5-2.5 (3) MG/3ML IN SOLN
3.0000 mL | RESPIRATORY_TRACT | Status: DC
  Administered 2017-02-05 (×3): 3 mL via RESPIRATORY_TRACT
  Filled 2017-02-05 (×3): qty 3

## 2017-02-05 MED ORDER — HYDRALAZINE HCL 20 MG/ML IJ SOLN: 5.0000 mg | INTRAMUSCULAR | Status: DC | PRN

## 2017-02-05 MED ORDER — LEVOFLOXACIN 500 MG PO TABS: 500.0000 mg | ORAL_TABLET | Freq: Every day | ORAL | Status: DC

## 2017-02-05 MED ORDER — PREDNISONE 20 MG PO TABS: 30.0000 mg | ORAL_TABLET | ORAL | Status: DC

## 2017-02-05 MED ORDER — ALBUTEROL SULFATE (2.5 MG/3ML) 0.083% IN NEBU: 2.5000 mg | INHALATION_SOLUTION | RESPIRATORY_TRACT | Status: DC | PRN

## 2017-02-05 MED ORDER — ALPRAZOLAM 0.25 MG PO TABS
0.2500 mg | ORAL_TABLET | Freq: Three times a day (TID) | ORAL | Status: DC | PRN
  Administered 2017-02-05 – 2017-02-07 (×5): 0.25 mg via ORAL
  Filled 2017-02-05 (×6): qty 1

## 2017-02-05 MED ORDER — HYDROMORPHONE HCL 1 MG/ML IJ SOLN
0.5000 mg | INTRAMUSCULAR | Status: DC | PRN
  Administered 2017-02-05 – 2017-02-07 (×12): 0.5 mg via INTRAVENOUS
  Filled 2017-02-05 (×13): qty 1

## 2017-02-05 MED ORDER — B COMPLEX-C PO TABS
1.0000 | ORAL_TABLET | Freq: Every day | ORAL | Status: DC
  Administered 2017-02-05 – 2017-02-07 (×3): 1 via ORAL
  Filled 2017-02-05 (×3): qty 1

## 2017-02-05 MED ORDER — HYDRALAZINE HCL 25 MG PO TABS
50.0000 mg | ORAL_TABLET | Freq: Three times a day (TID) | ORAL | Status: DC
  Administered 2017-02-05 (×2): 50 mg via ORAL
  Filled 2017-02-05 (×2): qty 2

## 2017-02-05 MED ORDER — AZITHROMYCIN 250 MG PO TABS: 500.0000 mg | ORAL_TABLET | Freq: Every day | ORAL | Status: DC

## 2017-02-05 MED ORDER — FENTANYL CITRATE (PF) 100 MCG/2ML IJ SOLN
50.0000 ug | Freq: Once | INTRAMUSCULAR | Status: AC
  Administered 2017-02-05: 50 ug via INTRAVENOUS
  Filled 2017-02-05: qty 2

## 2017-02-05 MED ORDER — PREDNISONE 20 MG PO TABS
30.0000 mg | ORAL_TABLET | Freq: Every day | ORAL | Status: DC
  Administered 2017-02-05 – 2017-02-07 (×3): 30 mg via ORAL
  Filled 2017-02-05 (×3): qty 1

## 2017-02-05 MED ORDER — HYDRALAZINE HCL 50 MG PO TABS
100.0000 mg | ORAL_TABLET | Freq: Three times a day (TID) | ORAL | Status: DC
  Administered 2017-02-05 – 2017-02-06 (×4): 100 mg via ORAL
  Filled 2017-02-05 (×5): qty 2

## 2017-02-05 MED ORDER — BISOPROLOL FUMARATE 10 MG PO TABS
10.0000 mg | ORAL_TABLET | Freq: Every day | ORAL | Status: DC
  Administered 2017-02-05: 10 mg via ORAL
  Filled 2017-02-05: qty 1

## 2017-02-05 MED ORDER — BUDESONIDE 0.25 MG/2ML IN SUSP
0.2500 mg | Freq: Two times a day (BID) | RESPIRATORY_TRACT | Status: DC
  Administered 2017-02-05 – 2017-02-07 (×4): 0.25 mg via RESPIRATORY_TRACT
  Filled 2017-02-05 (×4): qty 2

## 2017-02-05 MED ORDER — DABIGATRAN ETEXILATE MESYLATE 150 MG PO CAPS
150.0000 mg | ORAL_CAPSULE | Freq: Two times a day (BID) | ORAL | Status: DC
  Administered 2017-02-05 – 2017-02-07 (×5): 150 mg via ORAL
  Filled 2017-02-05 (×5): qty 1

## 2017-02-05 MED ORDER — BISOPROLOL FUMARATE 5 MG PO TABS: 5.0000 mg | ORAL_TABLET | Freq: Every day | ORAL | Status: DC

## 2017-02-05 MED ORDER — IRON 325 (65 FE) MG PO TABS: 1.0000 | ORAL_TABLET | Freq: Every day | ORAL | Status: DC

## 2017-02-05 MED ORDER — B COMPLEX PO TABS: 1.0000 | ORAL_TABLET | Freq: Every day | ORAL | Status: DC

## 2017-02-05 NOTE — ED Notes (Signed)
Pt sitting on the side of his bed, instructed that for his safety we would like him to have his feet in the bed with his side rails in the locked position. Pt refusing stating that he feels like he cant breath due to his COPD when he is not leaning forward on the side of the bed. Pt aware of risk of falls due to diagnosis at present time. EDP, and charge nurse aware.

## 2017-02-05 NOTE — Progress Notes (Signed)
[place] TEAM 1 - Stepdown/ICU TEAM  [name]  QXI:[id number] DOB: [birth year] DOA: 02/04/2017 PCP: Martinique, Betty G, MD    Brief Narrative:  66 y.o. male with history of COPD, chronic respiratory failure on 2 L oxygen at night, hypertension, hyperlipidemia, GERD, anxiety, PVD (left subclavian artery stenosis, renal artery stenosis and carotid artery stenosis), PAF, DVT on Pradaxa, CAD, chronic combined systolic and diastolic CHF, and a known 5.9cm AAA who presented with acute abdominal pain.  CTa in the ED revealed enlarging AAA at 7.3 x 6.8 cm.    Subjective: The patient complains of ongoing generalized abdominal pain which he reports is worse left of center in mid and lower areas.  He denies constipation or diarrhea.  He denies nausea or vomiting.  He reports ongoing shortness of breath as well.  Assessment & Plan:  Enlarging AAA Not a candidate for intervention of any kind - would wish to be kept comfortable should rupture occur - tx goal is for strict BP control - adjust med tx again today and follow   Possible PNA procalcitonin not c/w acute PNA - no focal infiltrate on CXR - doubt pt has an actual PNA - cont abx due to acute COPD exac   Severe COPD FEV1 30% of predicted - wheezing on exam today - cont bronchodilators, steroids, and abx tx  Chronic diastolic CHF EF 03-49 percent with grade 1 diastolic dysfunction per TTE December 2017 - follow daily wgt Filed Weights   02/05/17 0200  Weight: 65.6 kg (144 lb 10 oz)    Chronic atrial fib  Chadsvasc is 4 - continue Pradaxa - NSR at time of exam today   Hypokalemia Correct and follow - check Mg in AM  HLD  CAD  Anxiety d/o   Hx of DVT  DVT prophylaxis: Pradaxa Code Status: FULL CODE Family Communication: no family present at time of exam  Disposition Plan: SDU until resp status more stable   Consultants:  Vasc Surgery   Procedures: none  Antimicrobials:  Levaquin 5/6 >   Objective: Blood pressure (!)  145/78, pulse 74, temperature 98.2 F (36.8 C), temperature source Oral, resp. rate 20, height 5\' 7"  (1.702 m), weight 65.6 kg (144 lb 10 oz), SpO2 98 %.  Intake/Output Summary (Last 24 hours) at 02/05/17 1139 Last data filed at 02/05/17 0500  Gross per 24 hour  Intake              300 ml  Output              300 ml  Net                0 ml   Filed Weights   02/05/17 0200  Weight: 65.6 kg (144 lb 10 oz)    Examination: General: Mild respiratory distress with need to Paul's between sentences Lungs: Diffuse expiratory wheeze with fair air movement throughout Cardiovascular: Regular rate and rhythm without appreciable murmur Abdomen: Nontender to palpation, no appreciable mass, bowel sounds present, soft, overweight Extremities: No significant cyanosis, clubbing, or edema bilateral lower extremities  CBC:  Recent Labs Lab 02/04/17 2016 02/04/17 2038 02/05/17 0227  WBC 17.8*  --  17.8*  HGB 14.2 14.6 13.6  HCT 43.5 43.0 42.0  MCV 97.1  --  95.7  PLT 137*  --  179*   Basic Metabolic Panel:  Recent Labs Lab 02/04/17 2016 02/04/17 2038 02/05/17 0227  NA 138 137 136  K 3.8 3.5 3.1*  CL 96*  94* 95*  CO2 30  --  31  GLUCOSE 180* 171* 150*  BUN 20 23* 19  CREATININE 1.05 1.00 0.92  CALCIUM 9.5  --  9.1   GFR: Estimated Creatinine Clearance: 71.3 mL/min (by C-G formula based on SCr of 0.92 mg/dL).  Liver Function Tests:  Recent Labs Lab 02/04/17 2016  AST 25  ALT 33  ALKPHOS 79  BILITOT 1.3*  PROT 6.1*  ALBUMIN 3.8    Recent Labs Lab 02/04/17 2016  LIPASE 16    Coagulation Profile:  Recent Labs Lab 02/05/17 0004  INR 1.22    HbA1C: Hemoglobin A1C  Date/Time Value Ref Range Status  03/23/2016 03:33 PM 5.9  Final   Hgb A1c MFr Bld  Date/Time Value Ref Range Status  10/10/2016 03:28 PM 7.2 (H) 4.6 - 6.5 % Final    Comment:    Glycemic Control Guidelines for People with Diabetes:Non Diabetic:  <6%Goal of Therapy: <7%Additional Action  Suggested:  >8%     CBG:  Recent Labs Lab 02/05/17 0747  GLUCAP 105*    Recent Results (from the past 240 hour(s))  MRSA PCR Screening     Status: None   Collection Time: 02/05/17  2:29 AM  Result Value Ref Range Status   MRSA by PCR NEGATIVE NEGATIVE Final    Comment:        The GeneXpert MRSA Assay (FDA approved for NASAL specimens only), is one component of a comprehensive MRSA colonization surveillance program. It is not intended to diagnose MRSA infection nor to guide or monitor treatment for MRSA infections.      Scheduled Meds: . amLODipine  5 mg Oral Daily  . atorvastatin  80 mg Oral QHS  . B-complex with vitamin C  1 tablet Oral Daily  . bisoprolol  10 mg Oral Daily  . dabigatran  150 mg Oral Q12H  . dextromethorphan-guaiFENesin  1 tablet Oral BID  . ferrous sulfate  325 mg Oral Q breakfast  . furosemide  40 mg Oral Daily  . hydrALAZINE  50 mg Oral TID  . ipratropium-albuterol  3 mL Nebulization Q4H  . isosorbide mononitrate  15 mg Oral Daily  . mometasone-formoterol  2 puff Inhalation BID  . morphine  15 mg Oral QID  . multivitamin with minerals  1 tablet Oral Daily  . predniSONE  30 mg Oral Q breakfast  . roflumilast  500 mcg Oral Daily  . sodium chloride flush  3 mL Intravenous Q12H     LOS: 0 days   Cherene Altes, MD [facility] Office  [phone] Pager - Text Page per Amion as per below:  On-Call/Text Page:      [email or web address]      password [email or web address]  If 7PM-7AM, please contact night-coverage [email or web address] Password [facility] 02/05/2017, 11:39 AM
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
## 2017-02-05 NOTE — Progress Notes (Signed)
Pharmacy Antibiotic Note  [name] is a 66 y.o. male admitted on 02/04/2017 with CAP.  Pharmacy has been consulted for Levaquin dosing.  Plan: D/c Azithromycin Levaquin 750mg  IV Q24h Will f/u renal function, micro data and pt's clinical condition     Temp (24hrs), Avg:98.4 F (36.9 C), Min:98.4 F (36.9 C), Max:98.4 F (36.9 C)   Recent Labs Lab 02/04/17 2016 02/04/17 2038  WBC 17.8*  --   CREATININE 1.05 1.00  LATICACIDVEN  --  2.41*    CrCl cannot be calculated (Unknown ideal weight.).    Allergies  Allergen Reactions  . Cephalexin Anaphylaxis    REACTION: anaphylactic shock  . Penicillins Hives    REACTION: rash Has patient had a PCN reaction causing immediate rash, facial/tongue/throat swelling, SOB or lightheadedness with hypotension:YES Has patient had a PCN reaction causing severe rash involving mucus membranes or skin necrosis: NO Has patient had a PCN reaction that required hospitalization NO Has patient had a PCN reaction occurring within the last 10 years: NO If all of the above answers are "NO", then may proceed with Cephalosporin use.  . Vancomycin     thrombocytopenia  . Beta Adrenergic Blockers Other (See Comments)    Can take bisoprolol, cannot take less selective BB due to severe copd     Antimicrobials this admission: 5/7 Levaquin >>   Microbiology results: Pending  Thank you for allowing pharmacy to be a part of this patient's care.  Sherlon Handing, PharmD, BCPS Clinical pharmacist, pager [phone] 02/05/2017 12:52 AM

## 2017-02-05 NOTE — Progress Notes (Signed)
Pt has arrived to the floor via ED nurse on stretcher. PT c/o pain and was just medicated before arriving to floor. BP elevated. Will continue to monitor.

## 2017-02-06 DIAGNOSIS — I2510 Atherosclerotic heart disease of native coronary artery without angina pectoris: Secondary | ICD-10-CM

## 2017-02-06 LAB — LEGIONELLA PNEUMOPHILA SEROGP 1 UR AG: L. PNEUMOPHILA SEROGP 1 UR AG: NEGATIVE

## 2017-02-06 LAB — COMPREHENSIVE METABOLIC PANEL
ALBUMIN: 3.2 g/dL — AB (ref 3.5–5.0)
ALK PHOS: 80 U/L (ref 38–126)
ALT: 21 U/L (ref 17–63)
AST: 33 U/L (ref 15–41)
Anion gap: 11 (ref 5–15)
BUN: 22 mg/dL — AB (ref 6–20)
CALCIUM: 9 mg/dL (ref 8.9–10.3)
CHLORIDE: 91 mmol/L — AB (ref 101–111)
CO2: 30 mmol/L (ref 22–32)
CREATININE: 0.95 mg/dL (ref 0.61–1.24)
GFR calc non Af Amer: >60 mL/min (ref >60)
GLUCOSE: 82 mg/dL (ref 65–99)
Potassium: 4.3 mmol/L (ref 3.5–5.1)
SODIUM: 132 mmol/L — AB (ref 135–145)
Total Bilirubin: 2.1 mg/dL — ABNORMAL HIGH (ref 0.3–1.2)
Total Protein: 6 g/dL — ABNORMAL LOW (ref 6.5–8.1)

## 2017-02-06 LAB — MAGNESIUM: Magnesium: 2 mg/dL (ref 1.7–2.4)

## 2017-02-06 LAB — GLUCOSE, CAPILLARY: Glucose-Capillary: 109 mg/dL — ABNORMAL HIGH (ref 65–99)

## 2017-02-06 MED ORDER — HYDRALAZINE HCL 25 MG PO TABS
25.0000 mg | ORAL_TABLET | ORAL | Status: AC
  Administered 2017-02-07: 25 mg via ORAL
  Filled 2017-02-06: qty 1

## 2017-02-06 NOTE — Progress Notes (Signed)
PROGRESS NOTE    [name]  KZL:[id number] DOB: 1951/01/15 DOA: 02/04/2017 PCP: Martinique, Betty G, MD   Brief Narrative:   66 y.o. male with medical history significant of COPD, chronic respiratory failure on 2 L oxygen at night, hypertension, hyperlipidemia, GERD, anxiety, PVD (left subclavian artery stenosis, renal artery stenosis and carotid artery stenosis), PAF and DVT on Pradaxa, CAD, chronic combined systolic and diastolic CHF, AAA, who presents with abdominal pain.  He is known 5.9cm AAA.  CTa in the ED revealed enlarging AAA at 7.3 x 6.8 cm.  At [facility] and by [name] he is thought not be a candidate for Open or stent graft repair.   Assessment & Plan:   Principal Problem:   Abdominal pain Active Problems:   Hyperlipidemia   Essential hypertension   COPD with chronic bronchitis (HCC)   Coronary artery disease   Anxiety disorder   Aneurysm, thoracoabdominal (HCC)   AAA (abdominal aortic aneurysm) (HCC)   PAF (paroxysmal atrial fibrillation) (HCC)   Chronic diastolic heart failure (HCC)   DVT (deep venous thrombosis) (HCC)   PVD (peripheral vascular disease) ([place])   Sepsis (HCC)  Nonspecific Abdominal Pain/AAA -possible due to AAA, no other acute causes noted on the CT A/P -LFTs WNL at this time. Lipase 16.  -Seen by Vascular surgery- not a surgical candidate -BP is well controlled at this time. He does have high risk for rupture -family and patient is aware of this.   Mild exacerbation of COPD with possible Bronchitis -Procalcitonin is negative, leukocytosis is likely secondary to steroid use -hold off on Abx, received Levaquin yesterday.  -Continue nebulizer treatments, prednisone 30 mg daily, Mucinex and daliresp 500 g daily. -FEV1 30% of predicted   Leukocytosis-likely secondary to steroid use -Patient remains afebrile.  Chronic diastolic CHF, compensated -Ejection fraction is 55-60% with grade 1 diastolic dysfunction -Daily input and output. Daily  weight -Continue Imdur, Lipitor 80 mg at bedtime and Zebeta/Bisoprolol20 mg daily  Chronic atrial fib  Chadsvasc is 4  -continue Pradaxa  Replete Lytes   HLD -on statin  CAD --Continue Imdur, Lipitor 80 mg at bedtime and Zebeta/Bisoprolol20 mg daily  Anxiety -on xanax  Hx of DVT- already on pradaxa  DVT prophylaxis: pradaxa Code Status: Full  Family Communication: None at bedside   Disposition Plan: Tx to tele. Likely discharge in next 24-48 hrs    Consultants:   Vascular   Procedures:   None  Antimicrobials:   Levaquin 5/7   Subjective: No new complaints this morning. Still reports of mild abdominal pain which is nonspecific in nature. He understands is a poor surgical this time and his condition will be managed medically with blood pressure medications.  Objective: Vitals:   02/06/17 0351 02/06/17 0838 02/06/17 0855 02/06/17 1149  BP: 99/78 121/73  130/66  Pulse: 88 93 (!) 103 82  Resp: 17 (!) 24 20 17   Temp: 98.6 F (37 C) 98.6 F (37 C)  98.6 F (37 C)  TempSrc: Oral Oral  Oral  SpO2: 97% 95% 96% 98%  Weight:      Height:        Intake/Output Summary (Last 24 hours) at 02/06/17 1357 Last data filed at 02/06/17 1337  Gross per 24 hour  Intake              920 ml  Output             2450 ml  Net            -  1530 ml   Filed Weights   02/05/17 0200  Weight: 65.6 kg (144 lb 10 oz)    Examination:  General exam: Appears calm and comfortable  Respiratory system: Mild diffuse expiratory wheezing Cardiovascular system: S1 & S2 heard, RRR. No JVD, murmurs, rubs, gallops or clicks. No pedal edema. Gastrointestinal system: Abdomen is nondistended, soft and nontender. No organomegaly or masses felt. Normal bowel sounds heard. Central nervous system: Alert and oriented. No focal neurological deficits. Extremities: Symmetric 5 x 5 power. Skin: No rashes, lesions or ulcers Psychiatry: Judgement and insight appear normal. Mood & affect  appropriate.     Data Reviewed:   CBC:  Recent Labs Lab 02/04/17 2016 02/04/17 2038 02/05/17 0227  WBC 17.8*  --  17.8*  HGB 14.2 14.6 13.6  HCT 43.5 43.0 42.0  MCV 97.1  --  95.7  PLT 137*  --  657*   Basic Metabolic Panel:  Recent Labs Lab 02/04/17 2016 02/04/17 2038 02/05/17 0227 02/06/17 0251  NA 138 137 136 132*  K 3.8 3.5 3.1* 4.3  CL 96* 94* 95* 91*  CO2 30  --  31 30  GLUCOSE 180* 171* 150* 82  BUN 20 23* 19 22*  CREATININE 1.05 1.00 0.92 0.95  CALCIUM 9.5  --  9.1 9.0  MG  --   --   --  2.0   GFR: Estimated Creatinine Clearance: 69.1 mL/min (by C-G formula based on SCr of 0.95 mg/dL). Liver Function Tests:  Recent Labs Lab 02/04/17 2016 02/06/17 0251  AST 25 33  ALT 33 21  ALKPHOS 79 80  BILITOT 1.3* 2.1*  PROT 6.1* 6.0*  ALBUMIN 3.8 3.2*    Recent Labs Lab 02/04/17 2016  LIPASE 16   No results for input(s): AMMONIA in the last 168 hours. Coagulation Profile:  Recent Labs Lab 02/05/17 0004  INR 1.22   Cardiac Enzymes: No results for input(s): CKTOTAL, CKMB, CKMBINDEX, TROPONINI in the last 168 hours. BNP (last 3 results) No results for input(s): PROBNP in the last 8760 hours. HbA1C: No results for input(s): HGBA1C in the last 72 hours. CBG:  Recent Labs Lab 02/05/17 0747 02/06/17 0840  GLUCAP 105* 109*   Lipid Profile: No results for input(s): CHOL, HDL, LDLCALC, TRIG, CHOLHDL, LDLDIRECT in the last 72 hours. Thyroid Function Tests: No results for input(s): TSH, T4TOTAL, FREET4, T3FREE, THYROIDAB in the last 72 hours. Anemia Panel: No results for input(s): VITAMINB12, FOLATE, FERRITIN, TIBC, IRON, RETICCTPCT in the last 72 hours. Sepsis Labs:  Recent Labs Lab 02/04/17 2038 02/05/17 0227 02/05/17 0517  PROCALCITON  --  <0.10  --   LATICACIDVEN 2.41* 1.3 1.2    Recent Results (from the past 240 hour(s))  MRSA PCR Screening     Status: None   Collection Time: 02/05/17  2:29 AM  Result Value Ref Range Status    MRSA by PCR NEGATIVE NEGATIVE Final    Comment:        The GeneXpert MRSA Assay (FDA approved for NASAL specimens only), is one component of a comprehensive MRSA colonization surveillance program. It is not intended to diagnose MRSA infection nor to guide or monitor treatment for MRSA infections.          Radiology Studies: Ct Abdomen Pelvis Wo Contrast  Result Date: 02/04/2017 CLINICAL DATA:  Evaluate known abdominal aortic aneurysm. Generalized abdominal pain, acute onset. Initial encounter. EXAM: CT ABDOMEN AND PELVIS WITHOUT CONTRAST TECHNIQUE: Multidetector CT imaging of the abdomen and pelvis was performed following the  standard protocol without IV contrast. COMPARISON:  CT of the abdomen and pelvis from 07/30/2016 FINDINGS: Lower chest: The visualized lung bases are grossly clear. Scattered coronary artery calcifications are seen. Hepatobiliary: The liver is unremarkable in appearance. The gallbladder is unremarkable in appearance. The common bile duct remains normal in caliber. Pancreas: The pancreas is within normal limits. Spleen: Small calcified granulomata are noted at the spleen. The spleen is otherwise unremarkable. Adrenals/Urinary Tract: A small right renal cyst is noted. Scattered vascular calcifications are seen at both renal hila. The adrenal glands are grossly unremarkable. There is no evidence of hydronephrosis. No renal or ureteral stones are identified. Mild nonspecific perinephric stranding is noted bilaterally. Stomach/Bowel: The stomach is unremarkable in appearance. The small bowel is within normal limits. The appendix is normal in caliber, without evidence of appendicitis. Scattered diverticulosis is noted along the descending and sigmoid colon, without evidence of diverticulitis. Vascular/Lymphatic: Diffuse calcification is seen along the abdominal aorta and its branches. There is aneurysmal dilatation of the proximal abdominal aorta to 7.3 cm in AP dimension and 6.8  cm in transverse dimension. There is aneurysmal dilatation of the infrarenal abdominal aorta to 6.7 cm in AP dimension and 5.9 cm in transverse dimension. This has increased mildly in size along the entire course of the abdominal aorta. There is aneurysmal dilatation of the right common iliac artery to 2.7 cm in diameter. Diffuse calcification is seen along the proximal superior mesenteric artery. Reproductive: The bladder is mildly distended and grossly unremarkable. The prostate remains normal in size. Other: No additional soft tissue abnormalities are seen. Musculoskeletal: No acute osseous abnormalities are identified. There is grade 1 retrolisthesis of L2 on L3 and of L3 on L4, and grade 1 anterolisthesis of L5 on S1, with underlying facet disease. The visualized musculature is unremarkable in appearance. IMPRESSION: 1. Aneurysmal dilatation of the proximal abdominal aorta to 7.3 cm in AP dimension and 6.8 cm in transverse dimension, increased mildly in size from the prior study. In October 2017, the abdominal aorta measured 6.9 cm in AP dimension at this level. Aneurysmal dilatation of the right common iliac artery to 2.7 cm in diameter. Vascular surgery consultation recommended due to increased risk of rupture for AAA >5.5 cm. This recommendation follows ACR consensus guidelines: White Paper of the ACR Incidental Findings Committee II on Vascular Findings. J Am Coll Radiol 2013; 10:789-794. 2. Diffuse aortic atherosclerosis, and diffuse calcification along the proximal superior mesenteric artery. 3. Scattered coronary artery calcifications seen. 4. Small right renal cyst noted. 5. Scattered diverticulosis along the descending and sigmoid colon, without evidence of diverticulitis. 6. Mild degenerative change along the lumbar spine. Electronically Signed   By: Garald Balding M.D.   On: 02/04/2017 21:47   Dg Chest Port 1 View  Result Date: 02/05/2017 CLINICAL DATA:  Sepsis. EXAM: PORTABLE CHEST 1 VIEW  COMPARISON:  12/08/2016 FINDINGS: Mild cardiac enlargement. Emphysematous changes in the lungs. Slight fibrosis in the lung bases. Central pulmonary arteries are mildly prominent suggesting possibility of pulmonary arterial hypertension. No focal consolidation or airspace disease. No blunting of costophrenic angles. No pneumothorax. Calcified and tortuous aorta. IMPRESSION: Cardiac enlargement. Emphysematous changes and scattered fibrosis in the lungs. No focal consolidation. Prominent central pulmonary vascularity is nonspecific but could indicate pulmonary arterial hypertension. Electronically Signed   By: Lucienne Capers M.D.   On: 02/05/2017 01:46   Ct Angio Abd/pel W And/or Wo Contrast  Result Date: 02/04/2017 CLINICAL DATA:  Abdominal pain. Evaluate known abdominal aortic aneurysm. Mesenteric ischemia.  EXAM: CTA ABDOMEN AND PELVIS wITHOUT AND WITH CONTRAST TECHNIQUE: Multidetector CT imaging of the abdomen and pelvis was performed using the standard protocol during bolus administration of intravenous contrast. Multiplanar reconstructed images and MIPs were obtained and reviewed to evaluate the vascular anatomy. CONTRAST:  100 mL of Isovue 370 COMPARISON:  July 30, 2016 and Feb 04, 2017 FINDINGS: VASCULAR Aorta: The abdominal aortic aneurysm is again identified. The aneurysm extends from just above the renal arteries through the bifurcation. Just below the renal artery takes off, the aneurysm measures 7.3 by 6.8 cm in AP and transverse dimensions, unchanged since earlier today. In October 2017, the abdominal aorta measured 6.9 cm in AP dimension in this region, mildly larger today. More inferiorly, the abdominal aortic aneurysm measures 6.7 by 5.9 cm in AP and transverse dimensions today, unchanged since earlier today. The aneurysm measured 6.4 cm in AP dimension in this region in 2017. Concentric atherosclerotic plaque is identified in the aneurysm with no evidence of hemorrhage into the plaque. No  dissection. No periaortic stranding or inflammation. No evidence of leak. Celiac: Atherosclerotic change is seen in the celiac artery without significant stenosis. SMA: Atherosclerotic change is seen in the SMA without significant stenosis identified. Renals: The right renal artery remains narrowed near its origin. It is well opacified more peripherally. The left renal artery is also mildly narrowed tortuous origin but normal in caliber peripherally. IMA: The IMA is not visualized. Inflow: Aneurysmal dilatation of the right common iliac artery remains measuring 2.7 cm today versus 2.5 cm in 2017. The left common iliac artery measures 1.9 cm today, unchanged. The femoral vessels are normal in caliber. Proximal Outflow: See above. Veins: Not well assessed. Review of the MIP images confirms the above findings. NON-VASCULAR Lower chest: Mild tree in bud opacities in the right base consistent with an infectious process. No other interval changes. Hepatobiliary: Probable mild cholelithiasis. The liver the portal vein are normal. Pancreas: Unremarkable. No pancreatic ductal dilatation or surrounding inflammatory changes. Spleen: Normal in size without focal abnormality. Adrenals/Urinary Tract: The adrenal glands are normal. A cyst is seen in the right kidney. No suspicious renal masses or obstruction. The bladder is unremarkable. No ureteral stones identified. Stomach/Bowel: The stomach and small bowel are normal. No bowel wall thickening. Scattered colonic diverticulosis without diverticulitis. The colon is otherwise normal. The appendix is normal with no appendicitis. Lymphatic: No adenopathy. Reproductive: Prostate is unremarkable. Other: No abdominal wall hernia or abnormality. No abdominopelvic ascites. Musculoskeletal: No change since earlier today. Retrolisthesis of L3 versus L4 and L2 versus L3, grade 1 at both levels. Mild wedging of T11, unchanged. IMPRESSION: VASCULAR 1. Aneurysmal dilatation of the abdominal  aorta arising just above the renal artery origins measuring 7.3 x 6.8 cm in AP and transverse dimensions today. This compares to a maximum AP diameter of 6.9 cm in 2017 demonstrating worsening. No dissection. Atherosclerotic changes noted. Vascular surgery consultation recommended due to increased risk of rupture for AAA >5.5 cm. This recommendation follows ACR consensus guidelines: White Paper of the ACR Incidental Findings Committee II on Vascular Findings. J Am Coll Radiol 2013; 10:789-794. 2. The right common iliac artery measures 2.7 cm today, also worsened in the interval, measuring 2.5 cm previously. 3. No change in the left common iliac artery. 4. The mesenteric vessels demonstrate atherosclerotic change but remain patent. NON-VASCULAR 1. No bowel wall thickening to suggest ischemia. 2. Tree-in-bud opacities in the right lung base consistent with an infectious process. 3. Mild cholelithiasis. 4. No other interval  change. Electronically Signed   By: Dorise Bullion III M.D   On: 02/04/2017 23:32        Scheduled Meds: . amLODipine  10 mg Oral Daily  . atorvastatin  80 mg Oral QHS  . B-complex with vitamin C  1 tablet Oral Daily  . bisoprolol  20 mg Oral Daily  . budesonide (PULMICORT) nebulizer solution  0.25 mg Nebulization BID  . dabigatran  150 mg Oral Q12H  . dextromethorphan-guaiFENesin  1 tablet Oral BID  . furosemide  40 mg Oral Daily  . hydrALAZINE  100 mg Oral TID  . ipratropium-albuterol  3 mL Nebulization QID  . isosorbide mononitrate  15 mg Oral Daily  . morphine  15 mg Oral QID  . multivitamin with minerals  1 tablet Oral Daily  . polyethylene glycol  17 g Oral BID  . predniSONE  30 mg Oral Q breakfast  . roflumilast  500 mcg Oral Daily  . sodium chloride flush  3 mL Intravenous Q12H   Continuous Infusions: . sodium chloride 10 mL/hr at 02/06/17 0603     LOS: 1 day    Time spent: 35 mins     Ankit Arsenio Loader, MD [facility] Pager [phone]   If  7PM-7AM, please contact night-coverage [email or web address] Password [email or web address] 02/06/2017, 1:57 PM

## 2017-02-07 DIAGNOSIS — I714 Abdominal aortic aneurysm, without rupture: Principal | ICD-10-CM

## 2017-02-07 DIAGNOSIS — R109 Unspecified abdominal pain: Secondary | ICD-10-CM

## 2017-02-07 LAB — GLUCOSE, CAPILLARY: GLUCOSE-CAPILLARY: 114 mg/dL — AB (ref 65–99)

## 2017-02-07 MED ORDER — AMLODIPINE BESYLATE 10 MG PO TABS: 10.0000 mg | ORAL_TABLET | Freq: Every day | ORAL | 0 refills | Status: AC

## 2017-02-07 MED ORDER — OXYCODONE HCL 5 MG PO TABS: 5.0000 mg | ORAL_TABLET | Freq: Four times a day (QID) | ORAL | Status: DC | PRN

## 2017-02-07 MED ORDER — HYDRALAZINE HCL 100 MG PO TABS: 100.0000 mg | ORAL_TABLET | Freq: Three times a day (TID) | ORAL | 0 refills | Status: AC

## 2017-02-07 MED ORDER — ISOSORBIDE MONONITRATE ER 30 MG PO TB24: 15.0000 mg | ORAL_TABLET | Freq: Every day | ORAL | 0 refills | Status: AC

## 2017-02-07 MED ORDER — BISOPROLOL FUMARATE 10 MG PO TABS: 20.0000 mg | ORAL_TABLET | Freq: Every day | ORAL | 0 refills | Status: AC

## 2017-02-07 MED ORDER — OXYCODONE HCL 5 MG PO TABS: 5.0000 mg | ORAL_TABLET | Freq: Four times a day (QID) | ORAL | 0 refills | Status: AC | PRN

## 2017-02-07 MED ORDER — PREDNISONE 10 MG PO TABS: 30.0000 mg | ORAL_TABLET | Freq: Every day | ORAL | 0 refills | Status: DC

## 2017-02-07 NOTE — Care Management Note (Signed)
Case Management Note  Patient Details  Name: [name] MRN: [id number] Date of Birth: Jan 03, 1951  Subjective/Objective:   Admitted with Sepsis                Action/Plan: Patient lives at home with his spouse; PCP: [place], Betty G, MD; has private insurance with [facility] Medicare with prescription drug coverage; Patient furniture walks at home and does not want a rolling walker at this time, deconditioned and would benefit from [facility] services; [place] choice offered, pt chose [facility] [place] [facility]); Mary with [facility] made aware;  Expected Discharge Date:  02/07/17               Expected Discharge Plan:  [place]  In-House Referral:   [facility]  Discharge planning Services  CM Consult :    Choice offered to:  Patient  HH Arranged:  RN, PT, Nurse's Aide [place] Agency:  [facility] (now [facility])  Status of Service:  In process, will continue to follow  Sherrilyn Rist [phone] 02/07/2017, 3:17 PM

## 2017-02-07 NOTE — Progress Notes (Signed)
Taxi company called, pt wheeled downstairs to [employer]	[place] by Physicist, medical

## 2017-02-07 NOTE — Progress Notes (Signed)
Pt IV discontinued, catheter intact and telemetry removed. Pt discharge teaching went over at bedside. Pt has all belongings, discharge paperwork and printed prescriptions. Pt has taxi pass provided by social work. Pt discharged via wheelchair with nursing staff  [name] Leory Plowman

## 2017-02-07 NOTE — Discharge Summary (Signed)
Physician Discharge Summary  [name] PTW:[id number] DOB: 25-Mar-1951 DOA: 02/04/2017  PCP: Martinique, Betty G, MD  Admit date: 02/04/2017 Discharge date: 02/07/2017  Time spent: > 35 minutes  Recommendations for Outpatient Follow-up:  1. Ensure patient f/u with vascular surgeon 2. Continue to treat pain suspected to be related to aneurysm.  3. Continue blood pressure control and adjust bp medication accordingly   Discharge Diagnoses:  Principal Problem:   Abdominal pain - suspect secondary to AAA, no other acute causes noted on the CT A/P - d/c with oxy IR to take for breakthrough pain   Active Problems:   Hyperlipidemia - d/c on statin, stable    Essential hypertension - continue tight blood pressure control    COPD with chronic bronchitis ([place]) - d/c prednisone on d/c, no wheezes - continue home medication regimen.    Coronary artery disease   Anxiety disorder   Aneurysm, thoracoabdominal (HCC)/AAA (abdominal aortic aneurysm) [name]) - Vascular surgeon consulted who recommended the following:  ASSESSMENT:  Abdominal pain could be secondary to aneurysm.  Currently not ruptured.  Pt is not a candidate for open or endovascular repair of this.  He understands that he would not survive elective operation with most likely permanent vent dependence.  In light of this he is also not a candidate for emergent operation for rupture as odds of survival would be even less.  He has discussed this with his family and would want to be kept comfortable but no heroic measures in the event of rupture.  Would look for other possible treatable causes of his pain   PLAN:  No vascular surgical intervention planned for his thoracoabdominal aneurysm.   [name], MD    PAF (paroxysmal atrial fibrillation) ([place]) - Pt on pradaxa - continue bisoprolol    Chronic diastolic heart failure ([place]) - continue lasix, and BP medication listed below.    DVT (deep venous thrombosis) (HCC) - on  pradaxa on d/c    PVD (peripheral vascular disease) ([place]) - continue statin   Discharge Condition: stable  Diet recommendation: Heart healthy  Filed Weights   02/05/17 0200 02/06/17 1758 02/07/17 0413  Weight: 65.6 kg (144 lb 10 oz) 62.8 kg (138 lb 8 oz) 62.6 kg (137 lb 14.4 oz)    History of present illness:  66 y.o.malewith medical history significant of COPD, chronic respiratory failure on 2 L oxygen at night, hypertension, hyperlipidemia, GERD, anxiety, PVD (left subclavian artery stenosis, renal artery stenosis and carotid artery stenosis), PAF and DVT on Pradaxa, CAD, chronic combined systolic and diastolic CHF, AAA, whopresents with abdominal pain.  He is known 5.9cm AAA. CTa in the ED revealed enlarging AAA at 7.3 x 6.8 cm. At [name] and by [name] he is thought not be a candidate for Open or stent graft repair.   Hospital Course:  Please see above under problems listed.  Procedures:  None  Consultations:  Vascular surgeon  Discharge Exam: Vitals:   02/07/17 0806 02/07/17 1217  BP: 119/77 117/71  Pulse: 77 70  Resp:    Temp:      General: Pt in nad, alert and awake Cardiovascular: no rubs, s1 and s2 present Respiratory: no increased wob, no wheezes  Discharge Instructions   Discharge Instructions    Call MD for:  severe uncontrolled pain    Complete by:  As directed    Call MD for:  severe uncontrolled pain    Complete by:  As directed    Call MD for:  temperature >100.4    Complete by:  As directed    Diet - low sodium heart healthy    Complete by:  As directed    Diet - low sodium heart healthy    Complete by:  As directed    Discharge instructions    Complete by:  As directed    Please follow up with your vascular surgeon and primary care physician for further management of chronic pain   Increase activity slowly    Complete by:  As directed    Increase activity slowly    Complete by:  As directed      Current Discharge Medication  List    START taking these medications   Details  amLODipine (NORVASC) 10 MG tablet Take 1 tablet (10 mg total) by mouth daily. Qty: 30 tablet, Refills: 0    oxyCODONE (OXY IR/ROXICODONE) 5 MG immediate release tablet Take 1 tablet (5 mg total) by mouth every 6 (six) hours as needed for breakthrough pain. Qty: 30 tablet, Refills: 0      CONTINUE these medications which have CHANGED   Details  bisoprolol (ZEBETA) 10 MG tablet Take 2 tablets (20 mg total) by mouth daily. Qty: 60 tablet, Refills: 0    hydrALAZINE (APRESOLINE) 100 MG tablet Take 1 tablet (100 mg total) by mouth 3 (three) times daily. Qty: 90 tablet, Refills: 0    isosorbide mononitrate (IMDUR) 30 MG 24 hr tablet Take 0.5 tablets (15 mg total) by mouth daily. Qty: 30 tablet, Refills: 0      CONTINUE these medications which have NOT CHANGED   Details  albuterol (PROVENTIL HFA;VENTOLIN HFA) 108 (90 Base) MCG/ACT inhaler Inhale 2 puffs into the lungs every 6 (six) hours as needed for wheezing or shortness of breath. Qty: 1 Inhaler, Refills: 6    albuterol (PROVENTIL) (5 MG/ML) 0.5% nebulizer solution USE 0.5ML (ONE VIAL) IN NEBULIZER EVERY 4 HOURS AS NEEDED FOR WHEEZING OR SHORTNESS OF BREATH Qty: 120 vial, Refills: 1   Associated Diagnoses: Chronic obstructive pulmonary disease, unspecified COPD type (HCC)    ALPRAZolam (XANAX) 0.25 MG tablet TAKE ONE TABLET BY MOUTH THREE TIMES DAILY AS NEEDED FOR ANXIETY Qty: 90 tablet, Refills: 0   Associated Diagnoses: Generalized anxiety disorder    atorvastatin (LIPITOR) 80 MG tablet Take 1 tablet (80 mg total) by mouth daily. Qty: 90 tablet, Refills: 3    b complex vitamins tablet Take 1 tablet by mouth daily.     !! budesonide-formoterol (SYMBICORT) 160-4.5 MCG/ACT inhaler Inhale 2 puffs into the lungs 2 (two) times daily. Qty: 2 Inhaler, Refills: 0    dabigatran (PRADAXA) 150 MG CAPS capsule Take 1 capsule (150 mg total) by mouth every 12 (twelve) hours. Qty: 60  capsule, Refills: 0    Ferrous Sulfate (IRON) 325 (65 FE) MG TABS Take 1 tablet by mouth daily.    furosemide (LASIX) 40 MG tablet Take 1 tablet (40 mg total) by mouth daily. Qty: 30 tablet, Refills: 2    ipratropium (ATROVENT) 0.02 % nebulizer solution INHALE ONE VIAL IN NEBULIZER 4 TIMES DAILY Qty: 300 mL, Refills: 5    KRILL OIL 1000 MG CAPS Take 1 capsule by mouth daily.    morphine (MSIR) 15 MG tablet Take 1 tablet (15 mg total) by mouth 4 (four) times daily. Qty: 120 tablet, Refills: 0   Associated Diagnoses: Lumbar back pain with radiculopathy affecting right lower extremity; Chronic pain disorder    multivitamin (THERAGRAN) per tablet Take 1 tablet by mouth  daily.     arformoterol (BROVANA) 15 MCG/2ML NEBU Take 2 mLs (15 mcg total) by nebulization 2 (two) times daily. DX: J44.9 Qty: 120 mL, Refills: 11    budesonide (PULMICORT) 0.5 MG/2ML nebulizer solution Take 2 mLs (0.5 mg total) by nebulization 2 (two) times daily. J44.9 Qty: 120 mL, Refills: 11    !! budesonide-formoterol (SYMBICORT) 160-4.5 MCG/ACT inhaler Inhale 2 puffs into the lungs 2 (two) times daily. Qty: 2 Inhaler, Refills: 0    glucose blood (ACCU-CHEK AVIVA PLUS) test strip Use to test blood sugar once a day. Qty: 100 each, Refills: 3    Lancets (ACCU-CHEK SOFT TOUCH) lancets Test once daily Dx E11.9 Qty: 100 each, Refills: 12    Respiratory Therapy Supplies (FLUTTER) DEVI Use as directed. Qty: 1 each, Refills: 0   Associated Diagnoses: COPD exacerbation (HCC)    roflumilast (DALIRESP) 500 MCG TABS tablet Take 1 tablet (500 mcg total) by mouth daily. Qty: 3 tablet, Refills: 0     !! - Potential duplicate medications found. Please discuss with provider.    STOP taking these medications     predniSONE (DELTASONE) 10 MG tablet      traZODone (DESYREL) 50 MG tablet        Allergies  Allergen Reactions  . Cephalexin Anaphylaxis    REACTION: anaphylactic shock  . Penicillins Hives    REACTION:  rash Has patient had a PCN reaction causing immediate rash, facial/tongue/throat swelling, SOB or lightheadedness with hypotension:YES Has patient had a PCN reaction causing severe rash involving mucus membranes or skin necrosis: NO Has patient had a PCN reaction that required hospitalization NO Has patient had a PCN reaction occurring within the last 10 years: NO If all of the above answers are "NO", then may proceed with Cephalosporin use.  . Vancomycin     thrombocytopenia  . Beta Adrenergic Blockers Other (See Comments)    Can take bisoprolol, cannot take less selective BB due to severe copd    Follow-up Information    Martinique, Betty G, MD.   Specialty:  Family Medicine Contact information: [place] [address] [phone]            The results of significant diagnostics from this hospitalization (including imaging, microbiology, ancillary and laboratory) are listed below for reference.    Significant Diagnostic Studies: Ct Abdomen Pelvis Wo Contrast  Result Date: 02/04/2017 CLINICAL DATA:  Evaluate known abdominal aortic aneurysm. Generalized abdominal pain, acute onset. Initial encounter. EXAM: CT ABDOMEN AND PELVIS WITHOUT CONTRAST TECHNIQUE: Multidetector CT imaging of the abdomen and pelvis was performed following the standard protocol without IV contrast. COMPARISON:  CT of the abdomen and pelvis from 07/30/2016 FINDINGS: Lower chest: The visualized lung bases are grossly clear. Scattered coronary artery calcifications are seen. Hepatobiliary: The liver is unremarkable in appearance. The gallbladder is unremarkable in appearance. The common bile duct remains normal in caliber. Pancreas: The pancreas is within normal limits. Spleen: Small calcified granulomata are noted at the spleen. The spleen is otherwise unremarkable. Adrenals/Urinary Tract: A small right renal cyst is noted. Scattered vascular calcifications are seen at both renal hila. The adrenal  glands are grossly unremarkable. There is no evidence of hydronephrosis. No renal or ureteral stones are identified. Mild nonspecific perinephric stranding is noted bilaterally. Stomach/Bowel: The stomach is unremarkable in appearance. The small bowel is within normal limits. The appendix is normal in caliber, without evidence of appendicitis. Scattered diverticulosis is noted along the descending and sigmoid colon, without evidence  of diverticulitis. Vascular/Lymphatic: Diffuse calcification is seen along the abdominal aorta and its branches. There is aneurysmal dilatation of the proximal abdominal aorta to 7.3 cm in AP dimension and 6.8 cm in transverse dimension. There is aneurysmal dilatation of the infrarenal abdominal aorta to 6.7 cm in AP dimension and 5.9 cm in transverse dimension. This has increased mildly in size along the entire course of the abdominal aorta. There is aneurysmal dilatation of the right common iliac artery to 2.7 cm in diameter. Diffuse calcification is seen along the proximal superior mesenteric artery. Reproductive: The bladder is mildly distended and grossly unremarkable. The prostate remains normal in size. Other: No additional soft tissue abnormalities are seen. Musculoskeletal: No acute osseous abnormalities are identified. There is grade 1 retrolisthesis of L2 on L3 and of L3 on L4, and grade 1 anterolisthesis of L5 on S1, with underlying facet disease. The visualized musculature is unremarkable in appearance. IMPRESSION: 1. Aneurysmal dilatation of the proximal abdominal aorta to 7.3 cm in AP dimension and 6.8 cm in transverse dimension, increased mildly in size from the prior study. In October 2017, the abdominal aorta measured 6.9 cm in AP dimension at this level. Aneurysmal dilatation of the right common iliac artery to 2.7 cm in diameter. Vascular surgery consultation recommended due to increased risk of rupture for AAA >5.5 cm. This recommendation follows ACR consensus  guidelines: White Paper of the ACR Incidental Findings Committee II on Vascular Findings. J Am Coll Radiol 2013; 10:789-794. 2. Diffuse aortic atherosclerosis, and diffuse calcification along the proximal superior mesenteric artery. 3. Scattered coronary artery calcifications seen. 4. Small right renal cyst noted. 5. Scattered diverticulosis along the descending and sigmoid colon, without evidence of diverticulitis. 6. Mild degenerative change along the lumbar spine. Electronically Signed   By: Garald Balding M.D.   On: 02/04/2017 21:47   Dg Chest Port 1 View  Result Date: 02/05/2017 CLINICAL DATA:  Sepsis. EXAM: PORTABLE CHEST 1 VIEW COMPARISON:  12/08/2016 FINDINGS: Mild cardiac enlargement. Emphysematous changes in the lungs. Slight fibrosis in the lung bases. Central pulmonary arteries are mildly prominent suggesting possibility of pulmonary arterial hypertension. No focal consolidation or airspace disease. No blunting of costophrenic angles. No pneumothorax. Calcified and tortuous aorta. IMPRESSION: Cardiac enlargement. Emphysematous changes and scattered fibrosis in the lungs. No focal consolidation. Prominent central pulmonary vascularity is nonspecific but could indicate pulmonary arterial hypertension. Electronically Signed   By: Lucienne Capers M.D.   On: 02/05/2017 01:46   Ct Angio Abd/pel W And/or Wo Contrast  Result Date: 02/04/2017 CLINICAL DATA:  Abdominal pain. Evaluate known abdominal aortic aneurysm. Mesenteric ischemia. EXAM: CTA ABDOMEN AND PELVIS wITHOUT AND WITH CONTRAST TECHNIQUE: Multidetector CT imaging of the abdomen and pelvis was performed using the standard protocol during bolus administration of intravenous contrast. Multiplanar reconstructed images and MIPs were obtained and reviewed to evaluate the vascular anatomy. CONTRAST:  100 mL of Isovue 370 COMPARISON:  July 30, 2016 and Feb 04, 2017 FINDINGS: VASCULAR Aorta: The abdominal aortic aneurysm is again identified. The  aneurysm extends from just above the renal arteries through the bifurcation. Just below the renal artery takes off, the aneurysm measures 7.3 by 6.8 cm in AP and transverse dimensions, unchanged since earlier today. In October 2017, the abdominal aorta measured 6.9 cm in AP dimension in this region, mildly larger today. More inferiorly, the abdominal aortic aneurysm measures 6.7 by 5.9 cm in AP and transverse dimensions today, unchanged since earlier today. The aneurysm measured 6.4 cm in AP dimension in  this region in 2017. Concentric atherosclerotic plaque is identified in the aneurysm with no evidence of hemorrhage into the plaque. No dissection. No periaortic stranding or inflammation. No evidence of leak. Celiac: Atherosclerotic change is seen in the celiac artery without significant stenosis. SMA: Atherosclerotic change is seen in the SMA without significant stenosis identified. Renals: The right renal artery remains narrowed near its origin. It is well opacified more peripherally. The left renal artery is also mildly narrowed tortuous origin but normal in caliber peripherally. IMA: The IMA is not visualized. Inflow: Aneurysmal dilatation of the right common iliac artery remains measuring 2.7 cm today versus 2.5 cm in 2017. The left common iliac artery measures 1.9 cm today, unchanged. The femoral vessels are normal in caliber. Proximal Outflow: See above. Veins: Not well assessed. Review of the MIP images confirms the above findings. NON-VASCULAR Lower chest: Mild tree in bud opacities in the right base consistent with an infectious process. No other interval changes. Hepatobiliary: Probable mild cholelithiasis. The liver the portal vein are normal. Pancreas: Unremarkable. No pancreatic ductal dilatation or surrounding inflammatory changes. Spleen: Normal in size without focal abnormality. Adrenals/Urinary Tract: The adrenal glands are normal. A cyst is seen in the right kidney. No suspicious renal masses or  obstruction. The bladder is unremarkable. No ureteral stones identified. Stomach/Bowel: The stomach and small bowel are normal. No bowel wall thickening. Scattered colonic diverticulosis without diverticulitis. The colon is otherwise normal. The appendix is normal with no appendicitis. Lymphatic: No adenopathy. Reproductive: Prostate is unremarkable. Other: No abdominal wall hernia or abnormality. No abdominopelvic ascites. Musculoskeletal: No change since earlier today. Retrolisthesis of L3 versus L4 and L2 versus L3, grade 1 at both levels. Mild wedging of T11, unchanged. IMPRESSION: VASCULAR 1. Aneurysmal dilatation of the abdominal aorta arising just above the renal artery origins measuring 7.3 x 6.8 cm in AP and transverse dimensions today. This compares to a maximum AP diameter of 6.9 cm in 2017 demonstrating worsening. No dissection. Atherosclerotic changes noted. Vascular surgery consultation recommended due to increased risk of rupture for AAA >5.5 cm. This recommendation follows ACR consensus guidelines: White Paper of the ACR Incidental Findings Committee II on Vascular Findings. J Am Coll Radiol 2013; 10:789-794. 2. The right common iliac artery measures 2.7 cm today, also worsened in the interval, measuring 2.5 cm previously. 3. No change in the left common iliac artery. 4. The mesenteric vessels demonstrate atherosclerotic change but remain patent. NON-VASCULAR 1. No bowel wall thickening to suggest ischemia. 2. Tree-in-bud opacities in the right lung base consistent with an infectious process. 3. Mild cholelithiasis. 4. No other interval change. Electronically Signed   By: Dorise Bullion III M.D   On: 02/04/2017 23:32    Microbiology: Recent Results (from the past 240 hour(s))  Culture, blood (routine x 2) Call MD if unable to obtain prior to antibiotics being given     Status: None (Preliminary result)   Collection Time: 02/05/17  1:05 AM  Result Value Ref Range Status   Specimen Description  BLOOD RIGHT ANTECUBITAL  Final   Special Requests IN PEDIATRIC BOTTLE Blood Culture adequate volume  Final   Culture NO GROWTH 2 DAYS  Final   Report Status PENDING  Incomplete  Culture, blood (routine x 2) Call MD if unable to obtain prior to antibiotics being given     Status: None (Preliminary result)   Collection Time: 02/05/17  1:10 AM  Result Value Ref Range Status   Specimen Description BLOOD RIGHT HAND  Final  Special Requests   Final    BOTTLES DRAWN AEROBIC AND ANAEROBIC Blood Culture adequate volume   Culture NO GROWTH 2 DAYS  Final   Report Status PENDING  Incomplete  MRSA PCR Screening     Status: None   Collection Time: 02/05/17  2:29 AM  Result Value Ref Range Status   MRSA by PCR NEGATIVE NEGATIVE Final    Comment:        The GeneXpert MRSA Assay (FDA approved for NASAL specimens only), is one component of a comprehensive MRSA colonization surveillance program. It is not intended to diagnose MRSA infection nor to guide or monitor treatment for MRSA infections.      Labs: Basic Metabolic Panel:  Recent Labs Lab 02/04/17 2016 02/04/17 2038 02/05/17 0227 02/06/17 0251  NA 138 137 136 132*  K 3.8 3.5 3.1* 4.3  CL 96* 94* 95* 91*  CO2 30  --  31 30  GLUCOSE 180* 171* 150* 82  BUN 20 23* 19 22*  CREATININE 1.05 1.00 0.92 0.95  CALCIUM 9.5  --  9.1 9.0  MG  --   --   --  2.0   Liver Function Tests:  Recent Labs Lab 02/04/17 2016 02/06/17 0251  AST 25 33  ALT 33 21  ALKPHOS 79 80  BILITOT 1.3* 2.1*  PROT 6.1* 6.0*  ALBUMIN 3.8 3.2*    Recent Labs Lab 02/04/17 2016  LIPASE 16   No results for input(s): AMMONIA in the last 168 hours. CBC:  Recent Labs Lab 02/04/17 2016 02/04/17 2038 02/05/17 0227  WBC 17.8*  --  17.8*  HGB 14.2 14.6 13.6  HCT 43.5 43.0 42.0  MCV 97.1  --  95.7  PLT 137*  --  134*   Cardiac Enzymes: No results for input(s): CKTOTAL, CKMB, CKMBINDEX, TROPONINI in the last 168 hours. BNP: BNP (last 3  results)  Recent Labs  07/30/16 0933 02/05/17 0044  BNP 973.7* 192.9*    ProBNP (last 3 results) No results for input(s): PROBNP in the last 8760 hours.  CBG:  Recent Labs Lab 02/05/17 0747 02/06/17 0840 02/07/17 0656  GLUCAP 105* 109* 114*    Signed:  Velvet Bathe MD.  [facility] 02/07/2017, 2:07 PM

## 2017-02-07 NOTE — Consult Note (Addendum)
   [facility] CM Inpatient Consult   02/07/2017  LIGE LAKEMAN 04-Jul-1951 [id number]   Patient evaluated for restart of services with [facility].  Patient was assessed for [facility] for community services.   Patient was previously active with [facility].  Met with patient at bedside regarding being restarted with [facility] services. Consent form is filed and signed from previously.   Patient states he is "not feeling very well right now and states, they don't know is going on with me,  Can you check back when I feel a little better."   Of note, [facility] Management services does not replace or interfere with any services that are arranged by inpatient case management or social work. Contact information given and a 24 hour nurse advise line magnet. For additional questions or referrals please contact:  Natividad Brood, [facility] Liaison  [phone] business mobile phone Toll free office [phone]  [phone]  Received a call from the patient's wife, Charlette regarding home health care needs. Deferred to inpatient RNCM for follow up for home health aide.  Will have [facility] to follow up.  Patient's primary care provider [name], MD at [facility].

## 2017-02-07 NOTE — Progress Notes (Signed)
CSW was consulted by RN. Nurse stated that patient needs assistance with transportation. CSW assisted patient with a cab voucher home. RN was given cab voucher for patient. CSW signing off as patient no longer has needs.  Rhea Pink, MSW,  [place]

## 2017-02-08 ENCOUNTER — Telehealth: Payer: Self-pay | Admitting: Family Medicine

## 2017-02-08 NOTE — Telephone Encounter (Signed)
[name] with [facility] received the health health orders. Unable to see pt within the 48 hr window. Due to pt requesting to be seen on Monday, 5/14. Is that OK?

## 2017-02-09 ENCOUNTER — Emergency Department (HOSPITAL_COMMUNITY)
Admission: EM | Admit: 2017-02-09 | Discharge: 2017-03-02 | Disposition: E | Payer: Medicare HMO | Attending: Emergency Medicine | Admitting: Emergency Medicine

## 2017-02-09 ENCOUNTER — Encounter (HOSPITAL_COMMUNITY): Payer: Self-pay

## 2017-02-09 ENCOUNTER — Other Ambulatory Visit: Payer: Self-pay | Admitting: Pharmacist

## 2017-02-09 DIAGNOSIS — I10 Essential (primary) hypertension: Secondary | ICD-10-CM | POA: Diagnosis not present

## 2017-02-09 DIAGNOSIS — Z8673 Personal history of transient ischemic attack (TIA), and cerebral infarction without residual deficits: Secondary | ICD-10-CM | POA: Diagnosis not present

## 2017-02-09 DIAGNOSIS — Z79899 Other long term (current) drug therapy: Secondary | ICD-10-CM | POA: Insufficient documentation

## 2017-02-09 DIAGNOSIS — I252 Old myocardial infarction: Secondary | ICD-10-CM | POA: Insufficient documentation

## 2017-02-09 DIAGNOSIS — Z87891 Personal history of nicotine dependence: Secondary | ICD-10-CM | POA: Insufficient documentation

## 2017-02-09 DIAGNOSIS — I2510 Atherosclerotic heart disease of native coronary artery without angina pectoris: Secondary | ICD-10-CM | POA: Diagnosis not present

## 2017-02-09 DIAGNOSIS — J449 Chronic obstructive pulmonary disease, unspecified: Secondary | ICD-10-CM | POA: Diagnosis not present

## 2017-02-09 DIAGNOSIS — I469 Cardiac arrest, cause unspecified: Secondary | ICD-10-CM

## 2017-02-09 MED ORDER — EPINEPHRINE PF 1 MG/10ML IJ SOSY
PREFILLED_SYRINGE | INTRAMUSCULAR | Status: AC | PRN
  Administered 2017-02-09: 1 via INTRAVENOUS

## 2017-02-10 LAB — CULTURE, BLOOD (ROUTINE X 2)
CULTURE: NO GROWTH
CULTURE: NO GROWTH
SPECIAL REQUESTS: ADEQUATE
Special Requests: ADEQUATE

## 2017-02-10 MED FILL — Medication: Qty: 1 | Status: AC

## 2017-02-15 ENCOUNTER — Ambulatory Visit: Payer: Medicare HMO | Admitting: Family Medicine

## 2017-03-02 NOTE — ED Notes (Addendum)
Pt's wife escorted to waiting room by this RN. Pt's belongings all given to wife to take home.
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
## 2017-03-02 NOTE — ED Triage Notes (Addendum)
Pt from home, pt was sitting on the couch and collapsed, witnessed by wife. 5-7 minutes down time prior to EMS arrival. CPR started by EMS, 9 epis and 450 amio given. Pt shocked 9 times, pt went from v-fib to v-tach and then in PEA. Pt PEA on arrival and no pulses present. Pt noted to have hx of AAA and has abd distention, was seen by his dr last week and told that his AAA was swelling more. Pt took 4mg  of morphine this morning, narcan given with no response. CPR ongoing for 40 minutes now. CBG 89. No other vs able to be obtained.

## 2017-03-02 NOTE — Patient Outreach (Addendum)
[facility]) Care Management  03-05-17  MORGON PAMER [birth year] [id number]  Patient referred to [facility] by [facility] Liaison for "medication needs, difficulty obtaining medications."  His case was recently closed after multiple unsuccessful outreach attempts for medication assistance needs.    Outreach to patient, spouse on [facility] Consent form answered call and before HIPAA details could be verified, stated EMS was at the residence.    Plan:   Will review patient's chart next week to determine follow-up needs.   Karrie Meres, PharmD, [place] [phone]  Addendum  02/12/17 0840:    Per review of chart, patient expired.  Will not make any further outreach at this time.

## 2017-03-02 NOTE — ED Notes (Signed)
Pt's wife in room to visit with pt.

## 2017-03-02 NOTE — Code Documentation (Signed)
Time of death, 1530, called by Dr Maryan Rued

## 2017-03-02 NOTE — ED Notes (Signed)
Dr Maryan Rued and this RN in consultation room with pt's wife.

## 2017-03-02 NOTE — Code Documentation (Signed)
No pulses present, PEA on monitor

## 2017-03-02 NOTE — Telephone Encounter (Signed)
Returned call approving new soc date

## 2017-03-02 DEATH — deceased

## 2017-03-20 ENCOUNTER — Ambulatory Visit: Payer: Medicare HMO | Admitting: Family Medicine

## 2017-03-26 ENCOUNTER — Ambulatory Visit: Payer: Medicare HMO | Admitting: Cardiology

## 2017-04-06 ENCOUNTER — Other Ambulatory Visit: Payer: Self-pay | Admitting: Pulmonary Disease

## 2017-04-06 DIAGNOSIS — R911 Solitary pulmonary nodule: Secondary | ICD-10-CM

## 2017-04-16 ENCOUNTER — Other Ambulatory Visit: Payer: Self-pay | Admitting: Pulmonary Disease

## 2017-04-16 DIAGNOSIS — J449 Chronic obstructive pulmonary disease, unspecified: Secondary | ICD-10-CM

## 2017-10-30 IMAGING — CT CT CHEST W/O CM
2 of 3 series · 15 of 36 positions shown, 18 images · non-contrast
Comparison: CT chest exams dating back through 07/18/2013

CLINICAL DATA: Pulmonary nodular densities, three-month follow-up

EXAM:
CT CHEST WITHOUT CONTRAST
TECHNIQUE: Multidetector CT imaging of the chest was performed following the
standard protocol without IV contrast.

[Series 2: thorax · axial · 0.72mm/px · z∈[-335,-51]mm · 12 of 168 slices shown, 15 images]
[im 13/168  mediastinal]
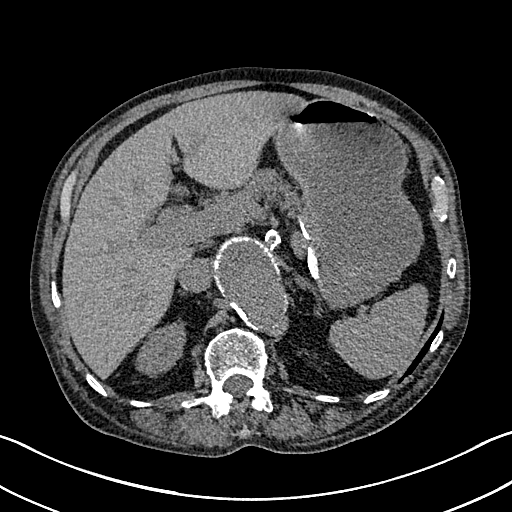
[im 13/168  lung]
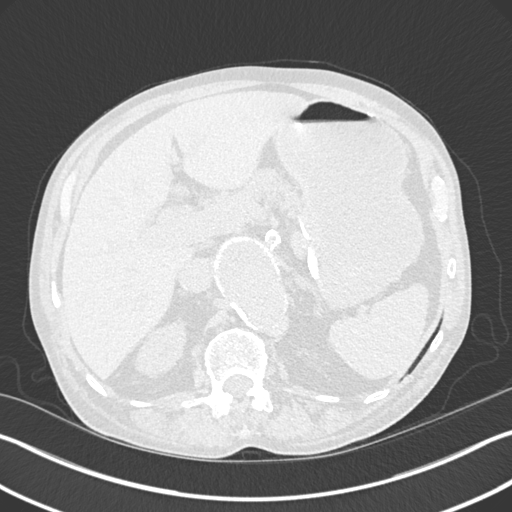
[im 25/168  lung]
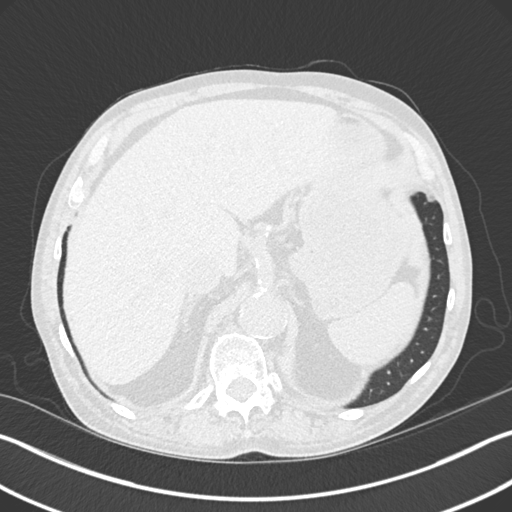
[im 38/168  lung]
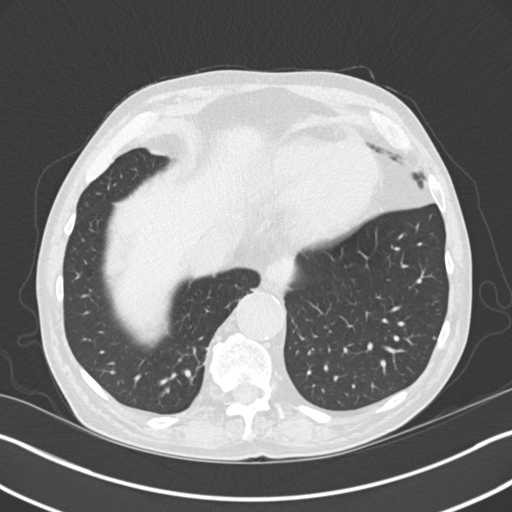
[im 50/168  lung]
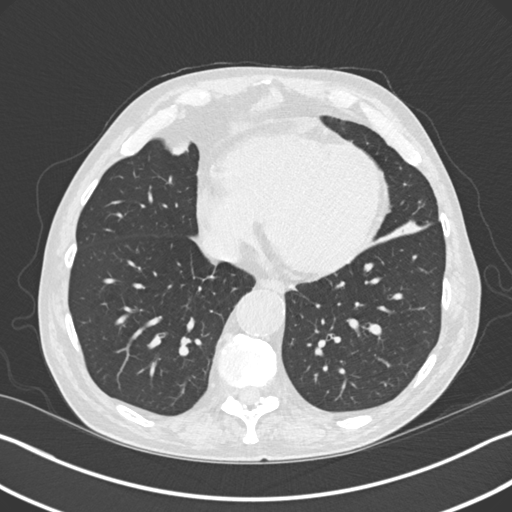
[im 62/168  mediastinal]
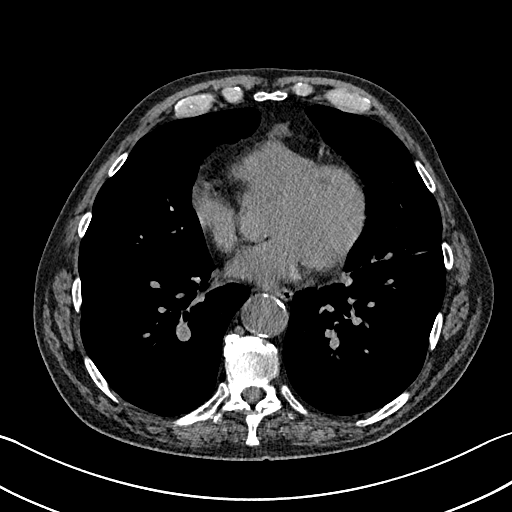
[im 62/168  lung]
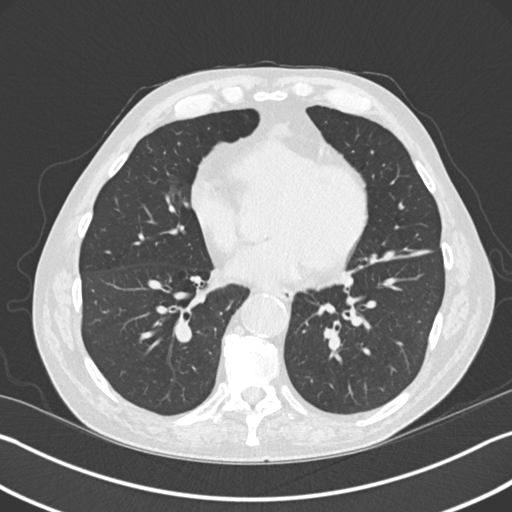
[im 75/168  lung]
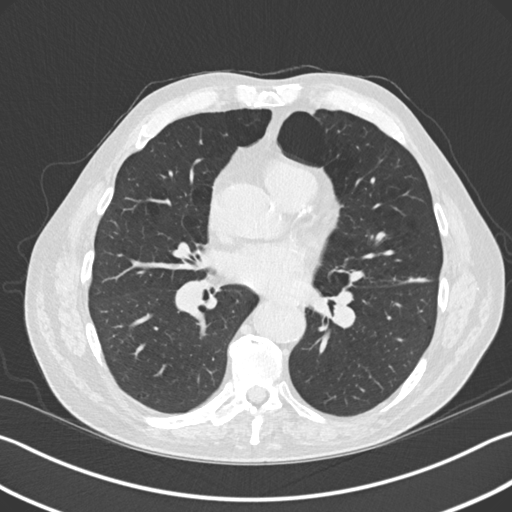
[im 93/168  lung]
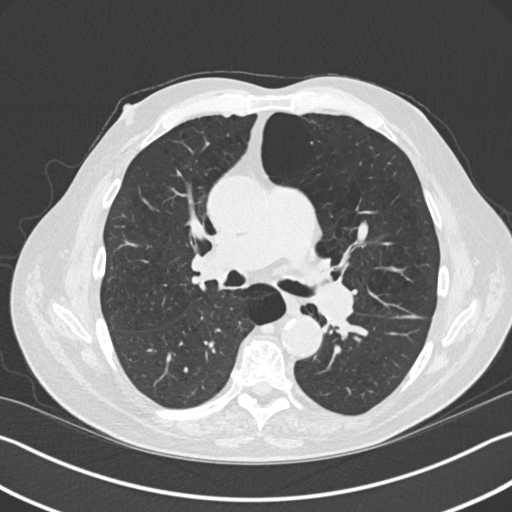
[im 106/168  lung]
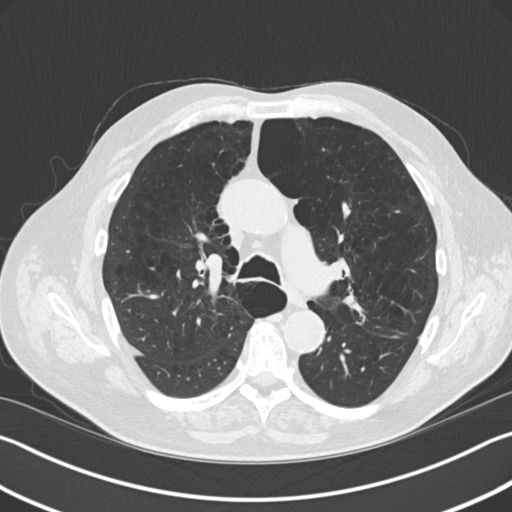
[im 118/168  mediastinal]
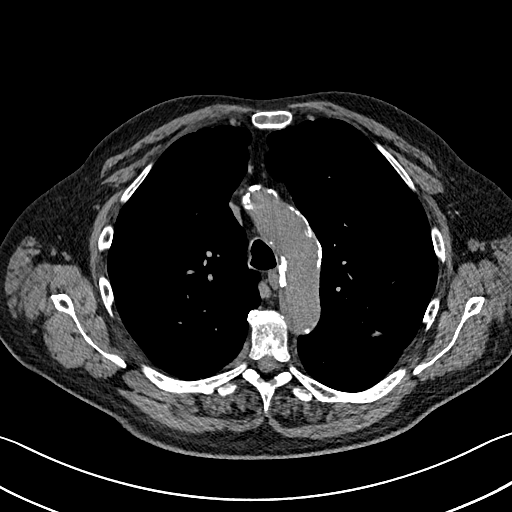
[im 118/168  lung]
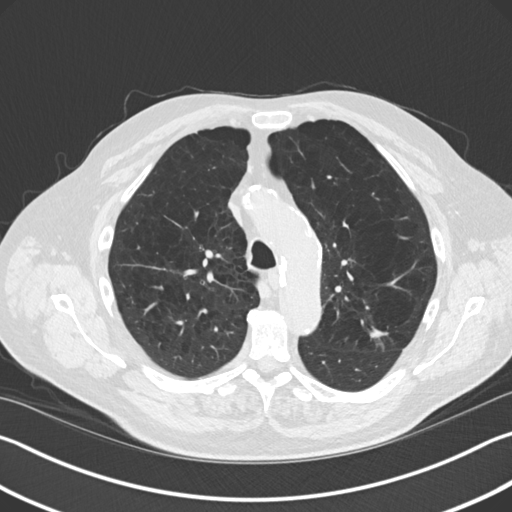
[im 130/168  lung]
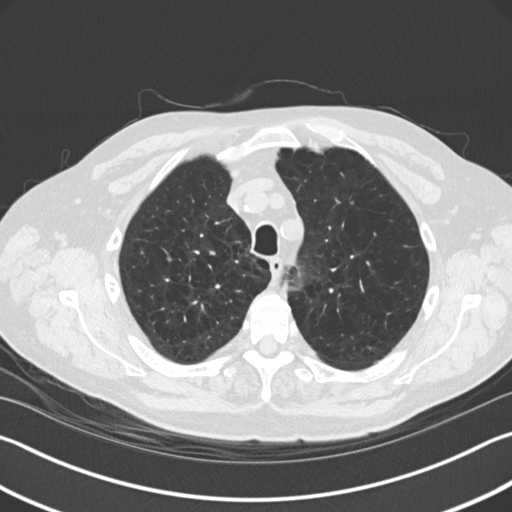
[im 143/168  lung]
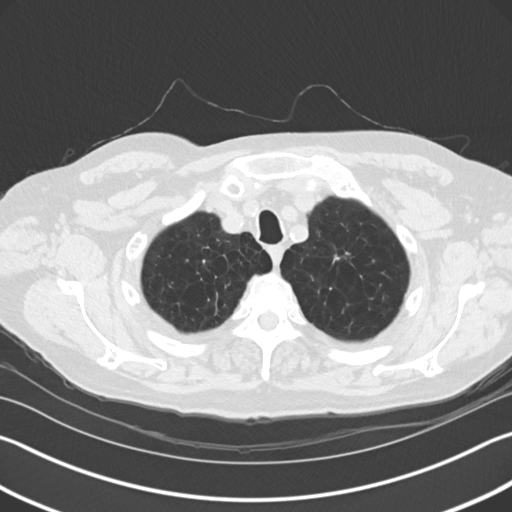
[im 155/168  lung]
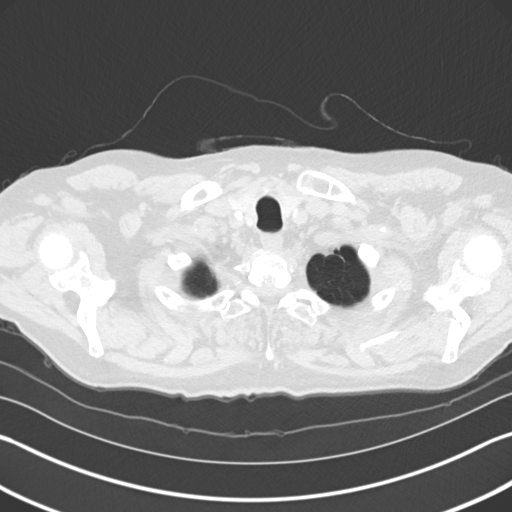

[Series 5: coronal · coronal · 0.65mm/px · 3 of 137 slices shown]
[im 28/137  lung]
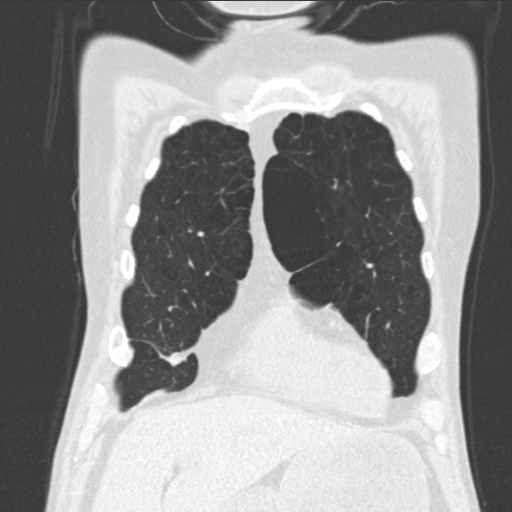
[im 55/137  lung]
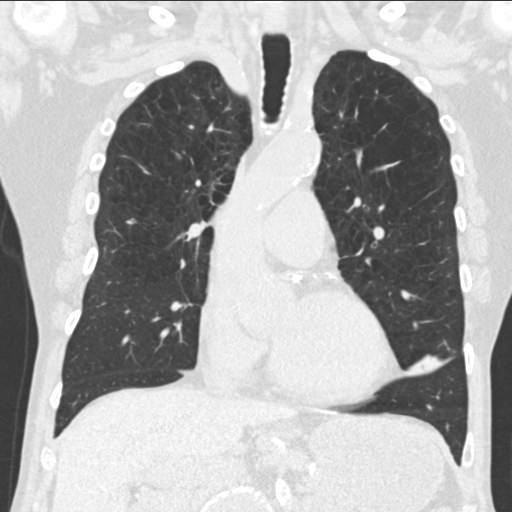
[im 82/137  lung]
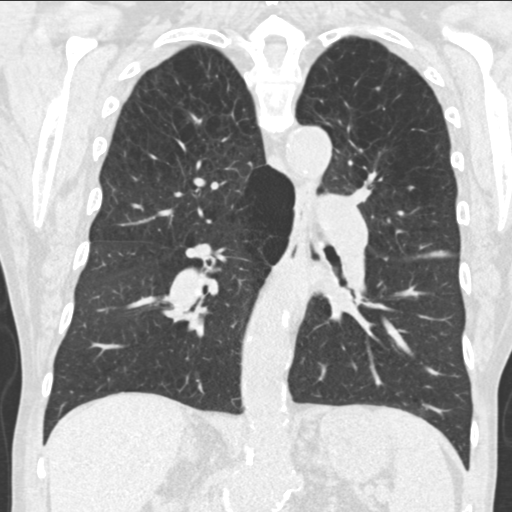

[15 of 36 positions shown; findings below may reference images not displayed]

FINDINGS: Cardiovascular: Cardiac chambers are top-normal in size. There is
LAD, circumflex, left main and RCA coronary arteriosclerosis. Trace
anterior pericardial effusion is stable. Aortic atherosclerosis is
again noted with stable dilatation of the ascending aorta measuring
up to 4.3 cm. Focal bulge along the inferior margin of the aortic
arch just distal to the left subclavian takeoff is stable.
Atherosclerotic plaquing noted at base of the great vessels. Stable
mild prominence main pulmonary artery to 3.7 cm.

Mediastinum/Nodes: No mediastinal masses or enlarged lymph nodes.

Lungs/Pleura: Upper lobe predominant centrilobular and paraseptal
emphysema with a slightly smaller stellate density at the left lung
apex now measuring approximately 5 mm versus 8 mm in 5951 consistent
with a pulmonary scar. Pleural-based superior segment left lower
lobe 4 mm nodular density is minimally smaller than the 05/29/2016
exam. Bandlike lingular density is also less prominent on current
exam likely representing area of resolving atelectasis. Stable right
middle lobe atelectasis and/or scarring. No effusion or
pneumothorax.

Upper Abdomen: Partially included abdominal aortic aneurysm is
stable at 7.1 cm maximum dimension.

Musculoskeletal: No focal nor acute osseous abnormality.
IMPRESSION: 1. Smaller left apical stellate density consistent with scarring.
2. Smaller (4mm vs 5mm) superior segment left lower lobe
pleural-based nodular density since recent comparison study
performed one month ago. No follow-up needed if patient is low-risk.
Non-contrast chest CT can be considered in 12 months if patient is
high-risk. This recommendation follows the consensus statement:
Guidelines for Management of Incidental Pulmonary Nodules Detected
[DATE].
3. Less prominent and smaller bandlike density in the lingula which
may reflect resolving area of atelectasis or focus of scarring.
4. Coronary arteriosclerosis.
5. Stable ascending aortic aneurysm up to 4.3 cm. Stable focal
aneurysmal bulge off the aortic arch.
6. Partially imaged abdominal aortic aneurysm measuring up to
cm.
7. Centrilobular and paraseptal emphysema.

## 2018-04-06 IMAGING — CT CT CTA ABD/PEL W/CM AND/OR W/O CM
2 of 9 series · 10 of 46 positions shown, 16 images · IV contrast (Omni 300)
Comparison: July 30, 2016 and February 04, 2017

CLINICAL DATA: Abdominal pain. Evaluate known abdominal aortic
aneurysm. Mesenteric ischemia.

EXAM:
CTA ABDOMEN AND PELVIS wITHOUT AND WITH CONTRAST
TECHNIQUE: Multidetector CT imaging of the abdomen and pelvis was performed
using the standard protocol during bolus administration of
intravenous contrast. Multiplanar reconstructed images and MIPs were
obtained and reviewed to evaluate the vascular anatomy.
CONTRAST:  100 mL of Isovue 370

[Series 7: renal cta cor · coronal · 0.71mm/px · 2 of 127 slices shown, 3 images]
[im 43/127  soft-tissue]
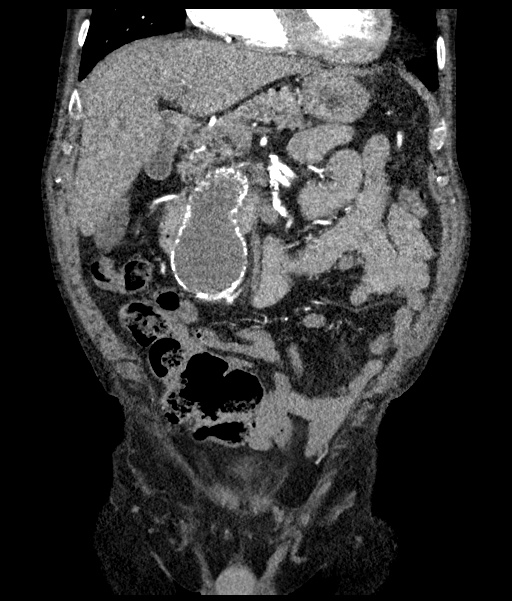
[im 43/127  bone]
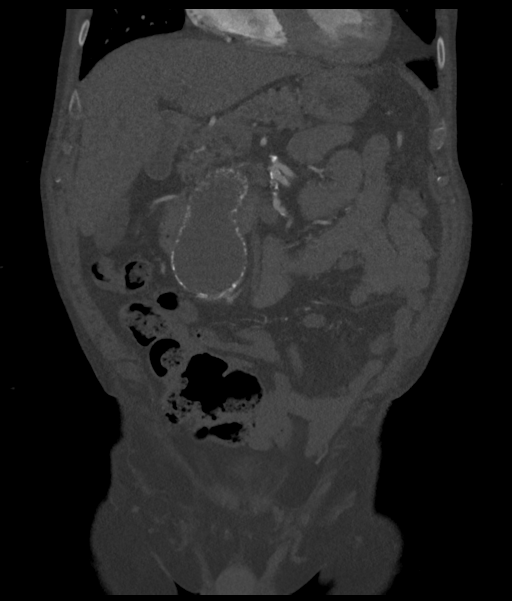
[im 85/127  soft-tissue]
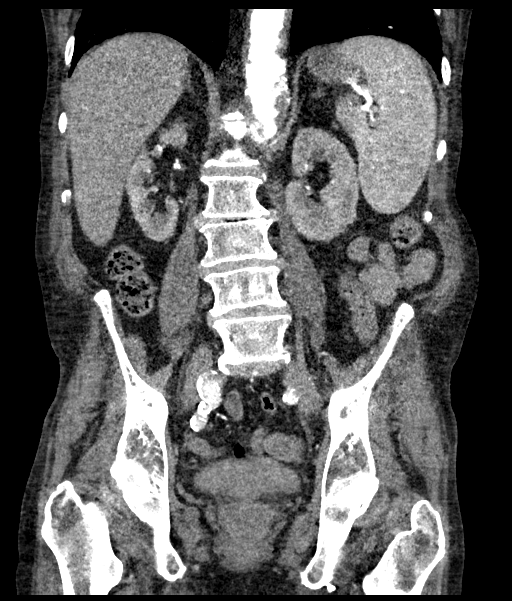

[Series 11: venous 5.0 · axial · portal-venous · 0.70mm/px · z∈[+766,+1086]mm · 8 of 84 slices shown, 13 images]
[im 10/84  soft-tissue]
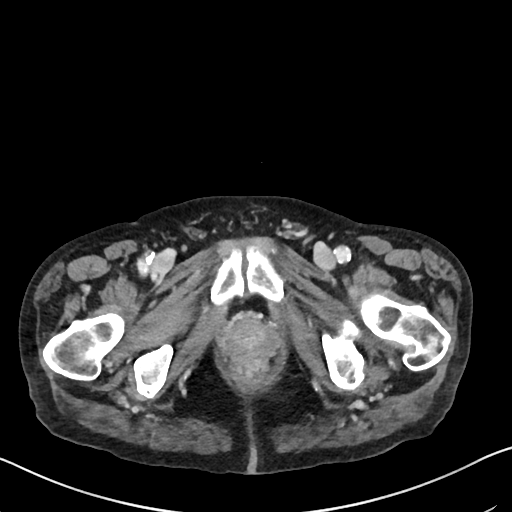
[im 10/84  bone]
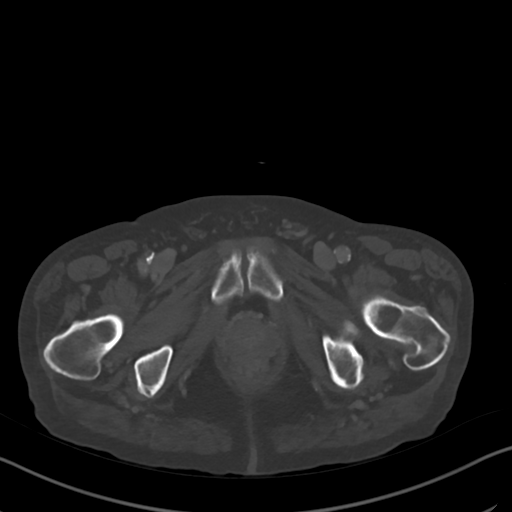
[im 19/84  soft-tissue]
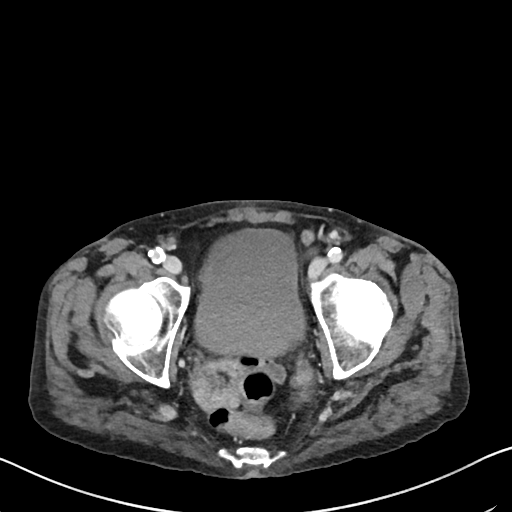
[im 28/84  soft-tissue]
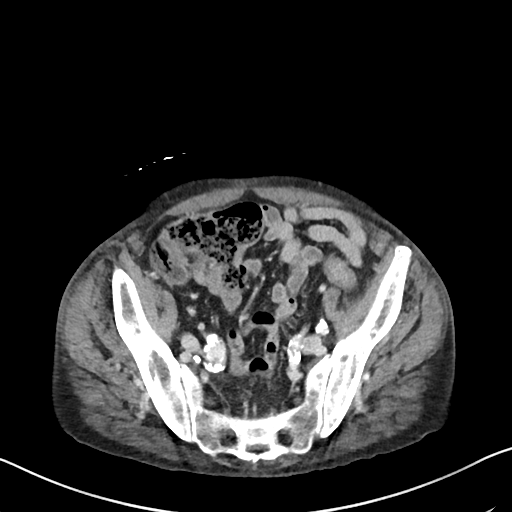
[im 37/84  soft-tissue]
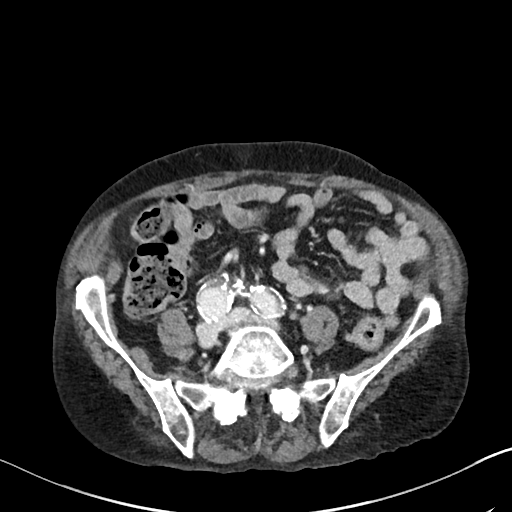
[im 47/84  soft-tissue]
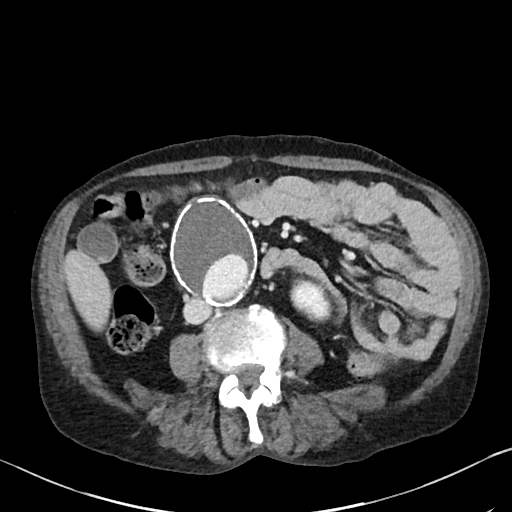
[im 47/84  lung]
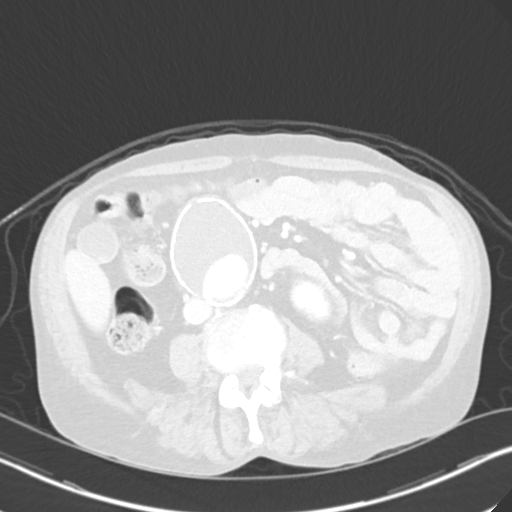
[im 56/84  soft-tissue]
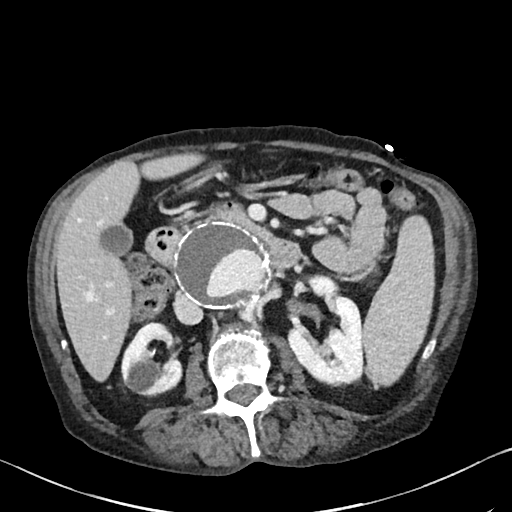
[im 56/84  lung]
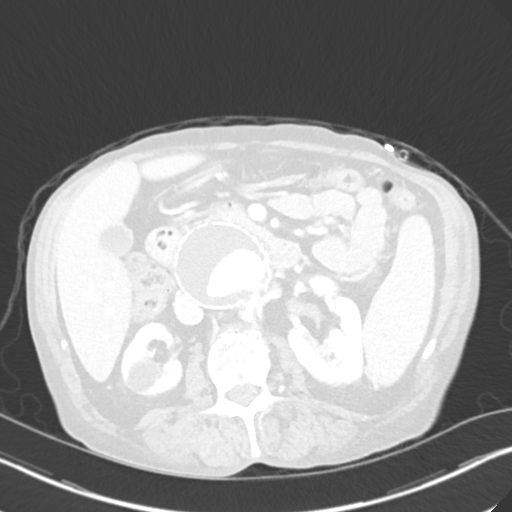
[im 65/84  soft-tissue]
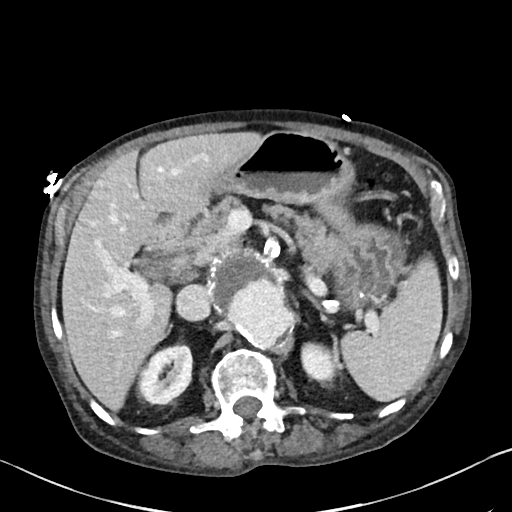
[im 65/84  lung]
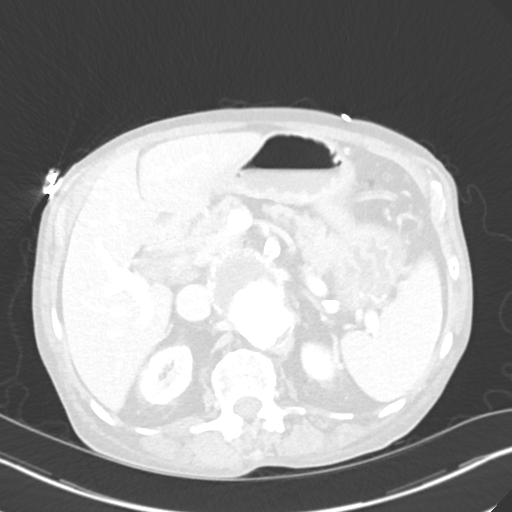
[im 74/84  soft-tissue]
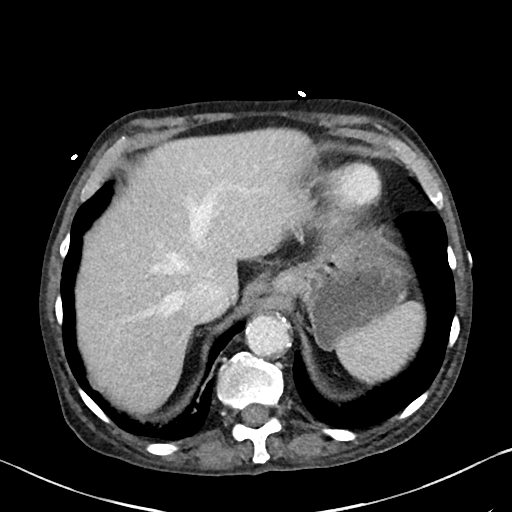
[im 74/84  lung]
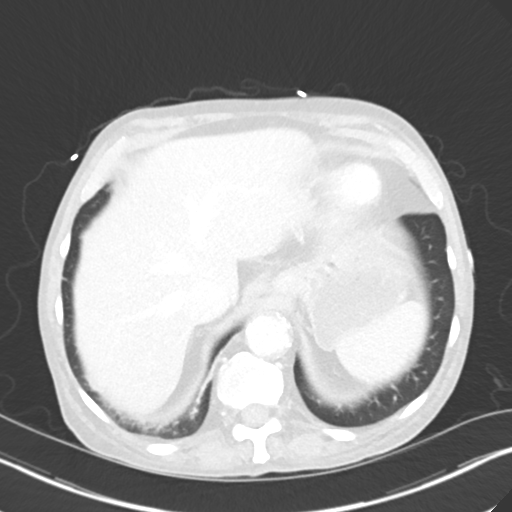

[10 of 46 positions shown; findings below may reference images not displayed]

FINDINGS: VASCULAR

Aorta: The abdominal aortic aneurysm is again identified. The
aneurysm extends from just above the renal arteries through the
bifurcation. Just below the renal artery takes off, the aneurysm
measures 7.3 by 6.8 cm in AP and transverse dimensions, unchanged
since earlier today. In July 2016, the abdominal aorta measured
6.9 cm in AP dimension in this region, mildly larger today. More
inferiorly, the abdominal aortic aneurysm measures 6.7 by 5.9 cm in
AP and transverse dimensions today, unchanged since earlier today.
The aneurysm measured 6.4 cm in AP dimension in this region in 3005.
Concentric atherosclerotic plaque is identified in the aneurysm with
no evidence of hemorrhage into the plaque. No dissection. No
periaortic stranding or inflammation. No evidence of leak.

Celiac: Atherosclerotic change is seen in the celiac artery without
significant stenosis.

SMA: Atherosclerotic change is seen in the SMA without significant
stenosis identified.

Renals: The right renal artery remains narrowed near its origin. It
is well opacified more peripherally. The left renal artery is also
mildly narrowed tortuous origin but normal in caliber peripherally.

IMA: The IMA is not visualized.

Inflow: Aneurysmal dilatation of the right common iliac artery
remains measuring 2.7 cm today versus 2.5 cm in 3005. The left
common iliac artery measures 1.9 cm today, unchanged. The femoral
vessels are normal in caliber.

Proximal Outflow: See above.

Veins: Not well assessed.

Review of the MIP images confirms the above findings.

NON-VASCULAR

Lower chest: Mild tree in bud opacities in the right base consistent
with an infectious process. No other interval changes.

Hepatobiliary: Probable mild cholelithiasis. The liver the portal
vein are normal.

Pancreas: Unremarkable. No pancreatic ductal dilatation or
surrounding inflammatory changes.

Spleen: Normal in size without focal abnormality.

Adrenals/Urinary Tract: The adrenal glands are normal. A cyst is
seen in the right kidney. No suspicious renal masses or obstruction.
The bladder is unremarkable. No ureteral stones identified.

Stomach/Bowel: The stomach and small bowel are normal. No bowel wall
thickening. Scattered colonic diverticulosis without diverticulitis.
The colon is otherwise normal. The appendix is normal with no
appendicitis.

Lymphatic: No adenopathy.

Reproductive: Prostate is unremarkable.

Other: No abdominal wall hernia or abnormality. No abdominopelvic
ascites.

Musculoskeletal: No change since earlier today. Retrolisthesis of L3
versus L4 and L2 versus L3, grade 1 at both levels. Mild wedging of
T11, unchanged.
IMPRESSION: VASCULAR

1. Aneurysmal dilatation of the abdominal aorta arising just above
the renal artery origins measuring 7.3 x 6.8 cm in AP and transverse
dimensions today. This compares to a maximum AP diameter of 6.9 cm
in 3005 demonstrating worsening. No dissection. Atherosclerotic
changes noted. Vascular surgery consultation recommended due to
increased risk of rupture for AAA >5.5 cm. This recommendation
follows ACR consensus guidelines: White Paper of the ACR Incidental
Findings Committee II on Vascular Findings. [HOSPITAL] 4346;
[DATE].
2. The right common iliac artery measures 2.7 cm today, also
worsened in the interval, measuring 2.5 cm previously.
3. No change in the left common iliac artery.
4. The mesenteric vessels demonstrate atherosclerotic change but
remain patent.

NON-VASCULAR

1. No bowel wall thickening to suggest ischemia.
2. Tree-in-bud opacities in the right lung base consistent with an
infectious process.
3. Mild cholelithiasis.
4. No other interval change.
